# Patient Record
Sex: Male | Born: 1940 | Race: White | Hispanic: No | Marital: Married | Smoking: Current some day smoker
Health system: Southern US, Community
[De-identification: ages and names within clinical notes are randomized; demographics above are authoritative.]

## PROBLEM LIST (undated history)

## (undated) DIAGNOSIS — M1990 Unspecified osteoarthritis, unspecified site: Secondary | ICD-10-CM

## (undated) DIAGNOSIS — F1011 Alcohol abuse, in remission: Secondary | ICD-10-CM

## (undated) DIAGNOSIS — R0602 Shortness of breath: Secondary | ICD-10-CM

## (undated) DIAGNOSIS — Z8679 Personal history of other diseases of the circulatory system: Secondary | ICD-10-CM

## (undated) DIAGNOSIS — J189 Pneumonia, unspecified organism: Secondary | ICD-10-CM

## (undated) DIAGNOSIS — Z9889 Other specified postprocedural states: Secondary | ICD-10-CM

## (undated) DIAGNOSIS — I2510 Atherosclerotic heart disease of native coronary artery without angina pectoris: Secondary | ICD-10-CM

## (undated) DIAGNOSIS — I719 Aortic aneurysm of unspecified site, without rupture: Secondary | ICD-10-CM

## (undated) DIAGNOSIS — C801 Malignant (primary) neoplasm, unspecified: Secondary | ICD-10-CM

## (undated) DIAGNOSIS — S301XXA Contusion of abdominal wall, initial encounter: Secondary | ICD-10-CM

## (undated) DIAGNOSIS — D099 Carcinoma in situ, unspecified: Secondary | ICD-10-CM

## (undated) DIAGNOSIS — Z860100 Personal history of colon polyps, unspecified: Secondary | ICD-10-CM

## (undated) DIAGNOSIS — I4891 Unspecified atrial fibrillation: Secondary | ICD-10-CM

## (undated) DIAGNOSIS — D509 Iron deficiency anemia, unspecified: Secondary | ICD-10-CM

## (undated) DIAGNOSIS — K5790 Diverticulosis of intestine, part unspecified, without perforation or abscess without bleeding: Secondary | ICD-10-CM

## (undated) DIAGNOSIS — Z952 Presence of prosthetic heart valve: Secondary | ICD-10-CM

## (undated) DIAGNOSIS — J449 Chronic obstructive pulmonary disease, unspecified: Secondary | ICD-10-CM

## (undated) DIAGNOSIS — K529 Noninfective gastroenteritis and colitis, unspecified: Secondary | ICD-10-CM

## (undated) DIAGNOSIS — R634 Abnormal weight loss: Secondary | ICD-10-CM

## (undated) DIAGNOSIS — N179 Acute kidney failure, unspecified: Secondary | ICD-10-CM

## (undated) DIAGNOSIS — Z8719 Personal history of other diseases of the digestive system: Secondary | ICD-10-CM

## (undated) HISTORY — DX: Diverticulosis of intestine, part unspecified, without perforation or abscess without bleeding: K57.90

## (undated) HISTORY — DX: Personal history of other diseases of the digestive system: Z87.19

## (undated) HISTORY — DX: Noninfective gastroenteritis and colitis, unspecified: K52.9

## (undated) HISTORY — PX: COLONOSCOPY: SHX174

## (undated) HISTORY — DX: Iron deficiency anemia, unspecified: D50.9

## (undated) HISTORY — DX: Contusion of abdominal wall, initial encounter: S30.1XXA

## (undated) HISTORY — PX: TONSILLECTOMY: SUR1361

## (undated) HISTORY — DX: Aortic aneurysm of unspecified site, without rupture: I71.9

## (undated) HISTORY — DX: Abnormal weight loss: R63.4

## (undated) HISTORY — DX: Other specified postprocedural states: Z98.890

## (undated) HISTORY — DX: Unspecified atrial fibrillation: I48.91

## (undated) HISTORY — DX: Atherosclerotic heart disease of native coronary artery without angina pectoris: I25.10

## (undated) HISTORY — PX: MAZE: SHX5063

## (undated) HISTORY — DX: Presence of prosthetic heart valve: Z95.2

## (undated) HISTORY — DX: Personal history of other diseases of the circulatory system: Z86.79

## (undated) HISTORY — DX: Carcinoma in situ, unspecified: D09.9

## (undated) HISTORY — DX: Alcohol abuse, in remission: F10.11

## (undated) HISTORY — DX: Malignant (primary) neoplasm, unspecified: C80.1

## (undated) HISTORY — DX: Chronic obstructive pulmonary disease, unspecified: J44.9

---

## 1994-12-24 DIAGNOSIS — D099 Carcinoma in situ, unspecified: Secondary | ICD-10-CM

## 1994-12-24 HISTORY — DX: Carcinoma in situ, unspecified: D09.9

## 1995-12-25 HISTORY — PX: HEMORRHOID SURGERY: SHX153

## 2001-04-18 ENCOUNTER — Encounter: Payer: Self-pay | Admitting: *Deleted

## 2001-04-18 ENCOUNTER — Ambulatory Visit (HOSPITAL_COMMUNITY): Admission: RE | Admit: 2001-04-18 | Discharge: 2001-04-18 | Payer: Self-pay | Admitting: *Deleted

## 2001-04-21 ENCOUNTER — Ambulatory Visit (HOSPITAL_COMMUNITY): Admission: RE | Admit: 2001-04-21 | Discharge: 2001-04-21 | Payer: Self-pay | Admitting: *Deleted

## 2001-05-02 ENCOUNTER — Ambulatory Visit (HOSPITAL_COMMUNITY): Admission: RE | Admit: 2001-05-02 | Discharge: 2001-05-02 | Payer: Self-pay | Admitting: Internal Medicine

## 2005-01-11 ENCOUNTER — Ambulatory Visit (HOSPITAL_COMMUNITY): Admission: RE | Admit: 2005-01-11 | Discharge: 2005-01-11 | Payer: Self-pay | Admitting: Family Medicine

## 2005-01-16 ENCOUNTER — Ambulatory Visit (HOSPITAL_COMMUNITY): Admission: RE | Admit: 2005-01-16 | Discharge: 2005-01-16 | Payer: Self-pay | Admitting: Family Medicine

## 2005-06-27 ENCOUNTER — Ambulatory Visit (HOSPITAL_COMMUNITY): Admission: RE | Admit: 2005-06-27 | Discharge: 2005-06-27 | Payer: Self-pay | Admitting: Surgery

## 2005-08-13 ENCOUNTER — Inpatient Hospital Stay (HOSPITAL_COMMUNITY): Admission: AD | Admit: 2005-08-13 | Discharge: 2005-08-15 | Payer: Self-pay | Admitting: Cardiology

## 2005-08-13 ENCOUNTER — Ambulatory Visit: Payer: Self-pay | Admitting: Cardiovascular Disease

## 2005-08-14 ENCOUNTER — Encounter: Payer: Self-pay | Admitting: Cardiovascular Disease

## 2005-08-20 ENCOUNTER — Ambulatory Visit: Payer: Self-pay | Admitting: Cardiology

## 2005-08-28 ENCOUNTER — Ambulatory Visit: Payer: Self-pay | Admitting: Cardiology

## 2005-09-03 ENCOUNTER — Ambulatory Visit: Payer: Self-pay | Admitting: Internal Medicine

## 2005-09-17 ENCOUNTER — Ambulatory Visit: Payer: Self-pay | Admitting: Internal Medicine

## 2005-09-20 ENCOUNTER — Ambulatory Visit: Payer: Self-pay | Admitting: Cardiovascular Disease

## 2005-10-01 ENCOUNTER — Ambulatory Visit: Payer: Self-pay | Admitting: Internal Medicine

## 2005-10-03 ENCOUNTER — Ambulatory Visit: Payer: Self-pay | Admitting: Cardiovascular Disease

## 2005-10-08 ENCOUNTER — Ambulatory Visit: Payer: Self-pay | Admitting: Internal Medicine

## 2005-10-10 ENCOUNTER — Ambulatory Visit (HOSPITAL_COMMUNITY): Admission: RE | Admit: 2005-10-10 | Discharge: 2005-10-10 | Payer: Self-pay | Admitting: Cardiovascular Disease

## 2005-10-12 ENCOUNTER — Ambulatory Visit: Payer: Self-pay | Admitting: Cardiology

## 2005-10-12 ENCOUNTER — Ambulatory Visit: Payer: Self-pay | Admitting: Cardiovascular Disease

## 2005-10-29 ENCOUNTER — Ambulatory Visit: Payer: Self-pay | Admitting: Pulmonary Disease

## 2005-11-05 ENCOUNTER — Ambulatory Visit: Payer: Self-pay | Admitting: Cardiology

## 2005-11-26 ENCOUNTER — Ambulatory Visit: Payer: Self-pay | Admitting: Cardiology

## 2005-11-30 ENCOUNTER — Ambulatory Visit: Payer: Self-pay | Admitting: Pulmonary Disease

## 2005-12-14 ENCOUNTER — Ambulatory Visit: Payer: Self-pay | Admitting: Cardiology

## 2006-01-04 ENCOUNTER — Ambulatory Visit: Payer: Self-pay | Admitting: Cardiovascular Disease

## 2006-01-21 ENCOUNTER — Ambulatory Visit: Payer: Self-pay | Admitting: Cardiovascular Disease

## 2006-01-21 ENCOUNTER — Ambulatory Visit: Payer: Self-pay | Admitting: Cardiology

## 2006-01-25 ENCOUNTER — Ambulatory Visit: Payer: Self-pay | Admitting: Cardiology

## 2006-02-18 ENCOUNTER — Ambulatory Visit: Payer: Self-pay | Admitting: Internal Medicine

## 2006-03-01 ENCOUNTER — Ambulatory Visit: Payer: Self-pay | Admitting: Pulmonary Disease

## 2006-03-01 ENCOUNTER — Ambulatory Visit (HOSPITAL_BASED_OUTPATIENT_CLINIC_OR_DEPARTMENT_OTHER): Admission: RE | Admit: 2006-03-01 | Discharge: 2006-03-01 | Payer: Self-pay | Admitting: Pulmonary Disease

## 2006-03-12 ENCOUNTER — Ambulatory Visit: Payer: Self-pay | Admitting: Pulmonary Disease

## 2006-03-15 ENCOUNTER — Ambulatory Visit: Payer: Self-pay | Admitting: Cardiovascular Disease

## 2006-04-04 ENCOUNTER — Ambulatory Visit: Payer: Self-pay | Admitting: Cardiology

## 2006-04-04 ENCOUNTER — Ambulatory Visit: Payer: Self-pay | Admitting: Cardiovascular Disease

## 2006-04-10 ENCOUNTER — Ambulatory Visit (HOSPITAL_COMMUNITY): Admission: RE | Admit: 2006-04-10 | Discharge: 2006-04-10 | Payer: Self-pay | Admitting: Cardiovascular Disease

## 2006-04-10 ENCOUNTER — Ambulatory Visit: Payer: Self-pay | Admitting: Cardiovascular Disease

## 2006-04-12 ENCOUNTER — Ambulatory Visit: Payer: Self-pay | Admitting: Pulmonary Disease

## 2006-04-22 ENCOUNTER — Ambulatory Visit: Payer: Self-pay | Admitting: Cardiology

## 2006-04-22 ENCOUNTER — Ambulatory Visit: Payer: Self-pay | Admitting: Cardiovascular Disease

## 2006-05-17 ENCOUNTER — Ambulatory Visit: Payer: Self-pay | Admitting: Cardiology

## 2006-05-27 ENCOUNTER — Ambulatory Visit: Payer: Self-pay | Admitting: Cardiology

## 2006-05-30 ENCOUNTER — Ambulatory Visit: Payer: Self-pay | Admitting: Internal Medicine

## 2006-06-14 ENCOUNTER — Ambulatory Visit: Payer: Self-pay | Admitting: Cardiovascular Disease

## 2006-07-05 ENCOUNTER — Ambulatory Visit: Payer: Self-pay | Admitting: Internal Medicine

## 2006-07-05 ENCOUNTER — Ambulatory Visit (HOSPITAL_COMMUNITY): Admission: RE | Admit: 2006-07-05 | Discharge: 2006-07-05 | Payer: Self-pay | Admitting: Internal Medicine

## 2006-07-05 ENCOUNTER — Encounter (INDEPENDENT_AMBULATORY_CARE_PROVIDER_SITE_OTHER): Payer: Self-pay | Admitting: Specialist

## 2006-09-02 ENCOUNTER — Ambulatory Visit: Payer: Self-pay | Admitting: Cardiovascular Disease

## 2007-02-10 ENCOUNTER — Ambulatory Visit: Payer: Self-pay | Admitting: Cardiovascular Disease

## 2007-02-10 LAB — CONVERTED CEMR LAB
BUN: 13 mg/dL (ref 6–23)
Chloride: 102 meq/L (ref 96–112)
Creatinine, Ser: 1 mg/dL (ref 0.4–1.5)

## 2007-02-14 ENCOUNTER — Ambulatory Visit: Payer: Self-pay | Admitting: Internal Medicine

## 2007-03-14 ENCOUNTER — Encounter: Admission: RE | Admit: 2007-03-14 | Discharge: 2007-03-14 | Payer: Self-pay | Admitting: Surgery

## 2007-03-17 ENCOUNTER — Emergency Department (HOSPITAL_COMMUNITY): Admission: EM | Admit: 2007-03-17 | Discharge: 2007-03-17 | Payer: Self-pay | Admitting: Emergency Medicine

## 2007-03-18 ENCOUNTER — Ambulatory Visit: Payer: Self-pay | Admitting: Surgery

## 2007-03-31 ENCOUNTER — Ambulatory Visit: Payer: Self-pay | Admitting: Cardiovascular Disease

## 2007-03-31 LAB — CONVERTED CEMR LAB
Basophils Relative: 0.3 % (ref 0.0–1.0)
CO2: 29 meq/L (ref 19–32)
Creatinine, Ser: 1 mg/dL (ref 0.4–1.5)
HCT: 43.8 % (ref 39.0–52.0)
Hemoglobin: 14.7 g/dL (ref 13.0–17.0)
INR: 1.4 (ref 0.9–2.0)
Monocytes Absolute: 0.5 10*3/uL (ref 0.2–0.7)
Neutrophils Relative %: 68.4 % (ref 43.0–77.0)
Potassium: 4 meq/L (ref 3.5–5.1)
Prothrombin Time: 14.4 s — ABNORMAL HIGH (ref 10.0–14.0)
RDW: 13.2 % (ref 11.5–14.6)
Sodium: 139 meq/L (ref 135–145)

## 2007-04-03 ENCOUNTER — Inpatient Hospital Stay (HOSPITAL_BASED_OUTPATIENT_CLINIC_OR_DEPARTMENT_OTHER): Admission: RE | Admit: 2007-04-03 | Discharge: 2007-04-03 | Payer: Self-pay | Admitting: Cardiovascular Disease

## 2007-04-03 ENCOUNTER — Ambulatory Visit: Payer: Self-pay | Admitting: Cardiovascular Disease

## 2007-04-03 HISTORY — PX: CARDIAC CATHETERIZATION: SHX172

## 2007-04-08 ENCOUNTER — Ambulatory Visit: Payer: Self-pay | Admitting: Surgery

## 2007-04-16 ENCOUNTER — Encounter (INDEPENDENT_AMBULATORY_CARE_PROVIDER_SITE_OTHER): Payer: Self-pay | Admitting: Specialist

## 2007-04-16 ENCOUNTER — Ambulatory Visit: Payer: Self-pay | Admitting: Surgery

## 2007-04-16 ENCOUNTER — Inpatient Hospital Stay (HOSPITAL_COMMUNITY): Admission: RE | Admit: 2007-04-16 | Discharge: 2007-04-23 | Payer: Self-pay | Admitting: Surgery

## 2007-04-16 ENCOUNTER — Ambulatory Visit: Payer: Self-pay | Admitting: Cardiovascular Disease

## 2007-04-16 HISTORY — PX: AORTIC VALVE REPLACEMENT: SHX41

## 2007-05-06 ENCOUNTER — Ambulatory Visit (HOSPITAL_COMMUNITY): Admission: RE | Admit: 2007-05-06 | Discharge: 2007-05-06 | Payer: Self-pay | Admitting: Cardiology

## 2007-05-06 ENCOUNTER — Encounter (HOSPITAL_COMMUNITY): Admission: RE | Admit: 2007-05-06 | Discharge: 2007-06-05 | Payer: Self-pay | Admitting: Cardiovascular Disease

## 2007-05-06 ENCOUNTER — Ambulatory Visit: Payer: Self-pay | Admitting: Cardiovascular Disease

## 2007-05-13 ENCOUNTER — Ambulatory Visit: Payer: Self-pay | Admitting: Surgery

## 2007-05-22 ENCOUNTER — Ambulatory Visit: Payer: Self-pay | Admitting: Cardiology

## 2007-05-22 ENCOUNTER — Ambulatory Visit (HOSPITAL_COMMUNITY): Admission: RE | Admit: 2007-05-22 | Discharge: 2007-05-22 | Payer: Self-pay | Admitting: Cardiovascular Disease

## 2007-05-27 ENCOUNTER — Ambulatory Visit: Payer: Self-pay | Admitting: Cardiology

## 2007-05-29 ENCOUNTER — Ambulatory Visit: Payer: Self-pay | Admitting: Cardiovascular Disease

## 2007-06-09 ENCOUNTER — Encounter (HOSPITAL_COMMUNITY): Admission: RE | Admit: 2007-06-09 | Discharge: 2007-07-09 | Payer: Self-pay | Admitting: Cardiovascular Disease

## 2007-06-18 ENCOUNTER — Ambulatory Visit (HOSPITAL_COMMUNITY): Admission: RE | Admit: 2007-06-18 | Discharge: 2007-06-18 | Payer: Self-pay | Admitting: Cardiovascular Disease

## 2007-06-18 ENCOUNTER — Ambulatory Visit: Payer: Self-pay | Admitting: Cardiovascular Disease

## 2007-07-11 ENCOUNTER — Encounter (HOSPITAL_COMMUNITY): Admission: RE | Admit: 2007-07-11 | Discharge: 2007-08-10 | Payer: Self-pay | Admitting: Cardiovascular Disease

## 2007-08-11 ENCOUNTER — Encounter (HOSPITAL_COMMUNITY): Admission: RE | Admit: 2007-08-11 | Discharge: 2007-09-10 | Payer: Self-pay | Admitting: Cardiovascular Disease

## 2007-08-12 ENCOUNTER — Ambulatory Visit: Payer: Self-pay | Admitting: Cardiovascular Disease

## 2007-09-23 ENCOUNTER — Ambulatory Visit: Payer: Self-pay | Admitting: Cardiology

## 2007-11-11 ENCOUNTER — Ambulatory Visit: Payer: Self-pay | Admitting: Cardiovascular Disease

## 2007-11-27 ENCOUNTER — Ambulatory Visit: Payer: Self-pay | Admitting: Cardiovascular Disease

## 2007-11-27 ENCOUNTER — Ambulatory Visit: Payer: Self-pay | Admitting: Internal Medicine

## 2007-11-27 LAB — CONVERTED CEMR LAB
AST: 21 units/L (ref 0–37)
Alkaline Phosphatase: 87 units/L (ref 39–117)
BUN: 11 mg/dL (ref 6–23)
CO2: 30 meq/L (ref 19–32)
Creatinine, Ser: 1.1 mg/dL (ref 0.4–1.5)
Potassium: 4.1 meq/L (ref 3.5–5.1)
TSH: 1 microintl units/mL (ref 0.35–5.50)
Total Bilirubin: 0.5 mg/dL (ref 0.3–1.2)
Total Protein: 6.9 g/dL (ref 6.0–8.3)

## 2007-12-12 ENCOUNTER — Ambulatory Visit: Payer: Self-pay | Admitting: Cardiology

## 2007-12-19 ENCOUNTER — Ambulatory Visit (HOSPITAL_COMMUNITY): Admission: RE | Admit: 2007-12-19 | Discharge: 2007-12-19 | Payer: Self-pay | Admitting: Family Medicine

## 2007-12-26 ENCOUNTER — Ambulatory Visit: Payer: Self-pay | Admitting: Cardiology

## 2008-01-09 ENCOUNTER — Ambulatory Visit: Payer: Self-pay | Admitting: Cardiology

## 2008-01-16 ENCOUNTER — Ambulatory Visit: Payer: Self-pay | Admitting: Cardiovascular Disease

## 2008-01-29 ENCOUNTER — Ambulatory Visit: Payer: Self-pay | Admitting: Cardiology

## 2008-01-30 ENCOUNTER — Ambulatory Visit: Payer: Self-pay | Admitting: Cardiovascular Disease

## 2008-01-30 ENCOUNTER — Ambulatory Visit (HOSPITAL_COMMUNITY): Admission: RE | Admit: 2008-01-30 | Discharge: 2008-01-30 | Payer: Self-pay | Admitting: Cardiovascular Disease

## 2008-02-09 ENCOUNTER — Ambulatory Visit: Payer: Self-pay | Admitting: Internal Medicine

## 2008-02-09 ENCOUNTER — Ambulatory Visit: Payer: Self-pay | Admitting: Cardiovascular Disease

## 2008-02-20 ENCOUNTER — Ambulatory Visit: Payer: Self-pay | Admitting: Cardiology

## 2008-03-25 ENCOUNTER — Ambulatory Visit: Payer: Self-pay | Admitting: Cardiovascular Disease

## 2008-04-09 ENCOUNTER — Ambulatory Visit: Payer: Self-pay | Admitting: Cardiology

## 2008-05-07 ENCOUNTER — Ambulatory Visit: Payer: Self-pay | Admitting: Cardiology

## 2008-06-04 ENCOUNTER — Ambulatory Visit: Payer: Self-pay | Admitting: Cardiology

## 2008-07-08 ENCOUNTER — Ambulatory Visit: Payer: Self-pay | Admitting: Internal Medicine

## 2008-07-09 ENCOUNTER — Ambulatory Visit: Payer: Self-pay | Admitting: Cardiology

## 2008-07-16 ENCOUNTER — Ambulatory Visit: Payer: Self-pay | Admitting: Cardiology

## 2008-07-23 ENCOUNTER — Ambulatory Visit (HOSPITAL_COMMUNITY): Admission: RE | Admit: 2008-07-23 | Discharge: 2008-07-23 | Payer: Self-pay | Admitting: Internal Medicine

## 2008-07-23 ENCOUNTER — Encounter: Payer: Self-pay | Admitting: Internal Medicine

## 2008-07-23 ENCOUNTER — Ambulatory Visit: Payer: Self-pay | Admitting: Internal Medicine

## 2008-07-23 DIAGNOSIS — K5790 Diverticulosis of intestine, part unspecified, without perforation or abscess without bleeding: Secondary | ICD-10-CM

## 2008-07-23 HISTORY — DX: Diverticulosis of intestine, part unspecified, without perforation or abscess without bleeding: K57.90

## 2008-07-26 ENCOUNTER — Ambulatory Visit: Payer: Self-pay | Admitting: Cardiology

## 2008-07-30 ENCOUNTER — Ambulatory Visit: Payer: Self-pay | Admitting: Cardiology

## 2008-08-13 ENCOUNTER — Ambulatory Visit: Payer: Self-pay | Admitting: Cardiology

## 2008-09-10 ENCOUNTER — Ambulatory Visit: Payer: Self-pay | Admitting: Cardiology

## 2008-09-24 ENCOUNTER — Ambulatory Visit: Payer: Self-pay | Admitting: Cardiovascular Disease

## 2008-09-24 ENCOUNTER — Ambulatory Visit: Payer: Self-pay | Admitting: Cardiology

## 2008-11-05 ENCOUNTER — Ambulatory Visit: Payer: Self-pay | Admitting: Cardiology

## 2008-12-20 ENCOUNTER — Ambulatory Visit: Payer: Self-pay | Admitting: Cardiology

## 2009-01-21 ENCOUNTER — Ambulatory Visit: Payer: Self-pay | Admitting: Cardiology

## 2009-02-04 ENCOUNTER — Ambulatory Visit: Payer: Self-pay | Admitting: Cardiology

## 2009-02-21 DIAGNOSIS — S301XXA Contusion of abdominal wall, initial encounter: Secondary | ICD-10-CM

## 2009-02-21 HISTORY — DX: Contusion of abdominal wall, initial encounter: S30.1XXA

## 2009-02-28 ENCOUNTER — Ambulatory Visit (HOSPITAL_COMMUNITY): Admission: RE | Admit: 2009-02-28 | Discharge: 2009-02-28 | Payer: Self-pay | Admitting: Family Medicine

## 2009-03-04 ENCOUNTER — Ambulatory Visit: Payer: Self-pay | Admitting: Cardiology

## 2009-03-11 ENCOUNTER — Ambulatory Visit: Payer: Self-pay | Admitting: Cardiology

## 2009-03-13 ENCOUNTER — Inpatient Hospital Stay (HOSPITAL_COMMUNITY): Admission: EM | Admit: 2009-03-13 | Discharge: 2009-03-16 | Payer: Self-pay | Admitting: Emergency Medicine

## 2009-03-31 DIAGNOSIS — I4891 Unspecified atrial fibrillation: Secondary | ICD-10-CM | POA: Insufficient documentation

## 2009-03-31 DIAGNOSIS — J449 Chronic obstructive pulmonary disease, unspecified: Secondary | ICD-10-CM

## 2009-03-31 DIAGNOSIS — J4489 Other specified chronic obstructive pulmonary disease: Secondary | ICD-10-CM | POA: Insufficient documentation

## 2009-03-31 DIAGNOSIS — Z954 Presence of other heart-valve replacement: Secondary | ICD-10-CM

## 2009-03-31 DIAGNOSIS — I25810 Atherosclerosis of coronary artery bypass graft(s) without angina pectoris: Secondary | ICD-10-CM | POA: Insufficient documentation

## 2009-04-01 ENCOUNTER — Encounter: Payer: Self-pay | Admitting: Cardiovascular Disease

## 2009-04-01 ENCOUNTER — Ambulatory Visit: Payer: Self-pay | Admitting: Cardiovascular Disease

## 2009-04-29 ENCOUNTER — Ambulatory Visit: Payer: Self-pay | Admitting: Cardiovascular Disease

## 2009-05-12 ENCOUNTER — Encounter: Payer: Self-pay | Admitting: Cardiovascular Disease

## 2009-05-16 ENCOUNTER — Encounter: Payer: Self-pay | Admitting: Cardiovascular Disease

## 2009-05-16 ENCOUNTER — Telehealth: Payer: Self-pay | Admitting: Cardiovascular Disease

## 2009-05-20 ENCOUNTER — Encounter: Payer: Self-pay | Admitting: Cardiovascular Disease

## 2009-05-24 ENCOUNTER — Telehealth: Payer: Self-pay | Admitting: Cardiovascular Disease

## 2009-05-26 ENCOUNTER — Encounter: Payer: Self-pay | Admitting: Cardiovascular Disease

## 2009-06-24 ENCOUNTER — Encounter: Payer: Self-pay | Admitting: Cardiovascular Disease

## 2009-06-28 ENCOUNTER — Telehealth: Payer: Self-pay | Admitting: Cardiovascular Disease

## 2009-06-28 LAB — CONVERTED CEMR LAB
INR: 1.9 — ABNORMAL HIGH (ref 0.0–1.5)
Prothrombin Time: 23.2 s — ABNORMAL HIGH (ref 11.6–15.2)
aPTT: 33 s (ref 24–37)

## 2009-07-11 ENCOUNTER — Ambulatory Visit: Payer: Self-pay | Admitting: Cardiovascular Disease

## 2009-07-11 DIAGNOSIS — Z91038 Other insect allergy status: Secondary | ICD-10-CM | POA: Insufficient documentation

## 2009-08-04 ENCOUNTER — Encounter (INDEPENDENT_AMBULATORY_CARE_PROVIDER_SITE_OTHER): Payer: Self-pay | Admitting: *Deleted

## 2009-08-05 ENCOUNTER — Telehealth: Payer: Self-pay | Admitting: Cardiovascular Disease

## 2009-08-08 ENCOUNTER — Encounter: Payer: Self-pay | Admitting: *Deleted

## 2009-08-09 LAB — CONVERTED CEMR LAB
INR: 1.5 (ref 0.0–1.5)
Prothrombin Time: 18.3 s — ABNORMAL HIGH (ref 11.6–15.2)

## 2009-08-18 ENCOUNTER — Encounter: Payer: Self-pay | Admitting: Cardiovascular Disease

## 2009-08-19 LAB — CONVERTED CEMR LAB: INR: 2.9 — ABNORMAL HIGH (ref 0.0–1.5)

## 2009-09-02 ENCOUNTER — Telehealth: Payer: Self-pay | Admitting: Cardiovascular Disease

## 2009-09-05 ENCOUNTER — Telehealth: Payer: Self-pay | Admitting: Cardiovascular Disease

## 2009-09-22 ENCOUNTER — Encounter: Payer: Self-pay | Admitting: Cardiovascular Disease

## 2009-09-26 LAB — CONVERTED CEMR LAB: aPTT: 35 s (ref 24–37)

## 2009-10-27 ENCOUNTER — Encounter: Payer: Self-pay | Admitting: Cardiovascular Disease

## 2009-10-27 LAB — CONVERTED CEMR LAB
INR: 1.91 — ABNORMAL HIGH (ref <1.50)
Prothrombin Time: 21.7 s — ABNORMAL HIGH (ref 11.6–15.2)
aPTT: 34 s (ref 24–37)

## 2009-10-28 ENCOUNTER — Ambulatory Visit: Payer: Self-pay | Admitting: Cardiovascular Disease

## 2009-11-29 ENCOUNTER — Encounter: Payer: Self-pay | Admitting: Cardiovascular Disease

## 2009-11-30 LAB — CONVERTED CEMR LAB: Prothrombin Time: 16.8 s — ABNORMAL HIGH (ref 11.6–15.2)

## 2009-12-01 ENCOUNTER — Ambulatory Visit (HOSPITAL_COMMUNITY): Admission: RE | Admit: 2009-12-01 | Discharge: 2009-12-01 | Payer: Self-pay | Admitting: Family Medicine

## 2009-12-06 ENCOUNTER — Encounter (INDEPENDENT_AMBULATORY_CARE_PROVIDER_SITE_OTHER): Payer: Self-pay | Admitting: *Deleted

## 2009-12-30 ENCOUNTER — Encounter: Payer: Self-pay | Admitting: Cardiovascular Disease

## 2010-01-13 ENCOUNTER — Encounter: Payer: Self-pay | Admitting: Cardiovascular Disease

## 2010-01-17 LAB — CONVERTED CEMR LAB
INR: 1.85 — ABNORMAL HIGH (ref <1.50)
Prothrombin Time: 21.2 s — ABNORMAL HIGH (ref 11.6–15.2)
aPTT: 35 s (ref 24–37)

## 2010-02-09 ENCOUNTER — Encounter: Payer: Self-pay | Admitting: Cardiovascular Disease

## 2010-02-10 LAB — CONVERTED CEMR LAB
INR: 2.07 — ABNORMAL HIGH (ref <1.50)
Prothrombin Time: 23.1 s — ABNORMAL HIGH (ref 11.6–15.2)
aPTT: 37 s (ref 24–37)

## 2010-02-21 DIAGNOSIS — Z8719 Personal history of other diseases of the digestive system: Secondary | ICD-10-CM

## 2010-02-21 HISTORY — DX: Personal history of other diseases of the digestive system: Z87.19

## 2010-03-08 ENCOUNTER — Encounter (INDEPENDENT_AMBULATORY_CARE_PROVIDER_SITE_OTHER): Payer: Self-pay | Admitting: *Deleted

## 2010-03-14 ENCOUNTER — Encounter: Payer: Self-pay | Admitting: Cardiovascular Disease

## 2010-04-13 ENCOUNTER — Encounter: Payer: Self-pay | Admitting: Cardiovascular Disease

## 2010-04-17 LAB — CONVERTED CEMR LAB: aPTT: 36 s (ref 24–37)

## 2010-04-27 ENCOUNTER — Ambulatory Visit: Payer: Self-pay | Admitting: Cardiovascular Disease

## 2010-05-01 ENCOUNTER — Inpatient Hospital Stay (HOSPITAL_COMMUNITY): Admission: EM | Admit: 2010-05-01 | Discharge: 2010-05-05 | Payer: Self-pay | Admitting: Emergency Medicine

## 2010-05-01 ENCOUNTER — Telehealth: Payer: Self-pay | Admitting: Cardiovascular Disease

## 2010-05-02 ENCOUNTER — Other Ambulatory Visit: Payer: Self-pay | Admitting: Cardiology

## 2010-05-02 ENCOUNTER — Ambulatory Visit: Payer: Self-pay | Admitting: Cardiology

## 2010-05-02 ENCOUNTER — Telehealth: Payer: Self-pay | Admitting: Cardiovascular Disease

## 2010-05-02 ENCOUNTER — Ambulatory Visit: Payer: Self-pay | Admitting: Gastroenterology

## 2010-05-03 ENCOUNTER — Other Ambulatory Visit: Payer: Self-pay | Admitting: Internal Medicine

## 2010-05-03 ENCOUNTER — Other Ambulatory Visit: Payer: Self-pay | Admitting: Cardiology

## 2010-05-03 ENCOUNTER — Other Ambulatory Visit: Payer: Self-pay | Admitting: Cardiovascular Disease

## 2010-05-03 ENCOUNTER — Encounter: Payer: Self-pay | Admitting: Internal Medicine

## 2010-05-03 DIAGNOSIS — Z9889 Other specified postprocedural states: Secondary | ICD-10-CM

## 2010-05-03 HISTORY — DX: Other specified postprocedural states: Z98.890

## 2010-05-04 ENCOUNTER — Other Ambulatory Visit: Payer: Self-pay | Admitting: Cardiology

## 2010-05-05 ENCOUNTER — Other Ambulatory Visit: Payer: Self-pay | Admitting: Internal Medicine

## 2010-05-15 ENCOUNTER — Ambulatory Visit: Payer: Self-pay | Admitting: Cardiovascular Disease

## 2010-05-25 DIAGNOSIS — Z87442 Personal history of urinary calculi: Secondary | ICD-10-CM

## 2010-05-25 DIAGNOSIS — C189 Malignant neoplasm of colon, unspecified: Secondary | ICD-10-CM | POA: Insufficient documentation

## 2010-05-26 ENCOUNTER — Ambulatory Visit: Payer: Self-pay | Admitting: Internal Medicine

## 2010-05-26 DIAGNOSIS — Z8719 Personal history of other diseases of the digestive system: Secondary | ICD-10-CM

## 2010-05-29 ENCOUNTER — Encounter: Payer: Self-pay | Admitting: Cardiovascular Disease

## 2010-05-30 ENCOUNTER — Telehealth (INDEPENDENT_AMBULATORY_CARE_PROVIDER_SITE_OTHER): Payer: Self-pay | Admitting: *Deleted

## 2010-05-30 ENCOUNTER — Telehealth: Payer: Self-pay | Admitting: Cardiovascular Disease

## 2010-05-31 ENCOUNTER — Encounter: Payer: Self-pay | Admitting: Gastroenterology

## 2010-05-31 LAB — CONVERTED CEMR LAB
HCT: 34.5 % — ABNORMAL LOW (ref 39.0–52.0)
Hemoglobin: 11.4 g/dL — ABNORMAL LOW (ref 13.0–17.0)
INR: 2.03 — ABNORMAL HIGH (ref <1.50)

## 2010-06-09 ENCOUNTER — Encounter (INDEPENDENT_AMBULATORY_CARE_PROVIDER_SITE_OTHER): Payer: Self-pay

## 2010-06-30 ENCOUNTER — Encounter: Payer: Self-pay | Admitting: Cardiovascular Disease

## 2010-06-30 ENCOUNTER — Encounter: Payer: Self-pay | Admitting: Gastroenterology

## 2010-07-10 LAB — CONVERTED CEMR LAB: Hemoglobin: 12.6 g/dL — ABNORMAL LOW (ref 13.0–17.0)

## 2010-08-01 ENCOUNTER — Encounter: Payer: Self-pay | Admitting: Cardiovascular Disease

## 2010-08-04 LAB — CONVERTED CEMR LAB
INR: 1.57 — ABNORMAL HIGH (ref <1.50)
aPTT: 36 s (ref 24–37)

## 2010-08-10 ENCOUNTER — Encounter: Payer: Self-pay | Admitting: Gastroenterology

## 2010-08-16 ENCOUNTER — Ambulatory Visit: Payer: Self-pay | Admitting: Cardiovascular Disease

## 2010-08-30 ENCOUNTER — Encounter: Payer: Self-pay | Admitting: Cardiovascular Disease

## 2010-08-31 LAB — CONVERTED CEMR LAB: Prothrombin Time: 27.5 s — ABNORMAL HIGH (ref 11.6–15.2)

## 2010-09-11 ENCOUNTER — Telehealth: Payer: Self-pay | Admitting: Cardiovascular Disease

## 2010-10-04 ENCOUNTER — Encounter: Payer: Self-pay | Admitting: Cardiovascular Disease

## 2010-10-06 LAB — CONVERTED CEMR LAB
INR: 2.5 — ABNORMAL HIGH (ref <1.50)
Prothrombin Time: 27.1 s — ABNORMAL HIGH (ref 11.6–15.2)

## 2010-11-07 ENCOUNTER — Encounter: Payer: Self-pay | Admitting: Cardiovascular Disease

## 2010-11-13 ENCOUNTER — Telehealth: Payer: Self-pay | Admitting: Cardiovascular Disease

## 2010-12-07 ENCOUNTER — Encounter: Payer: Self-pay | Admitting: Cardiovascular Disease

## 2010-12-20 LAB — CONVERTED CEMR LAB: INR: 2.62 — ABNORMAL HIGH (ref <1.50)

## 2011-01-03 ENCOUNTER — Telehealth: Payer: Self-pay | Admitting: Cardiovascular Disease

## 2011-01-04 ENCOUNTER — Encounter: Payer: Self-pay | Admitting: Cardiovascular Disease

## 2011-01-05 ENCOUNTER — Other Ambulatory Visit: Payer: Self-pay | Admitting: Cardiovascular Disease

## 2011-01-05 ENCOUNTER — Ambulatory Visit
Admission: RE | Admit: 2011-01-05 | Discharge: 2011-01-05 | Payer: Self-pay | Source: Home / Self Care | Attending: Cardiology | Admitting: Cardiology

## 2011-01-05 ENCOUNTER — Ambulatory Visit (HOSPITAL_COMMUNITY)
Admission: RE | Admit: 2011-01-05 | Discharge: 2011-01-05 | Payer: Self-pay | Source: Home / Self Care | Attending: Cardiovascular Disease | Admitting: Cardiovascular Disease

## 2011-01-05 ENCOUNTER — Ambulatory Visit: Admission: RE | Admit: 2011-01-05 | Discharge: 2011-01-05 | Payer: Self-pay | Source: Home / Self Care

## 2011-01-05 ENCOUNTER — Inpatient Hospital Stay (HOSPITAL_COMMUNITY)
Admission: AD | Admit: 2011-01-05 | Discharge: 2011-01-09 | Payer: Self-pay | Source: Home / Self Care | Attending: Cardiology | Admitting: Cardiology

## 2011-01-05 ENCOUNTER — Other Ambulatory Visit: Payer: Self-pay | Admitting: Physician Assistant

## 2011-01-05 ENCOUNTER — Encounter: Payer: Self-pay | Admitting: Physician Assistant

## 2011-01-05 DIAGNOSIS — R0602 Shortness of breath: Secondary | ICD-10-CM | POA: Insufficient documentation

## 2011-01-05 LAB — BASIC METABOLIC PANEL
BUN: 14 mg/dL (ref 6–23)
CO2: 30 mEq/L (ref 19–32)
Calcium: 8.9 mg/dL (ref 8.4–10.5)
Chloride: 102 mEq/L (ref 96–112)
Creatinine, Ser: 1 mg/dL (ref 0.4–1.5)
GFR: 78.22 mL/min (ref >60.00)
Glucose, Bld: 95 mg/dL (ref 70–99)
Potassium: 4.3 mEq/L (ref 3.5–5.1)
Sodium: 138 mEq/L (ref 135–145)

## 2011-01-05 LAB — CBC WITH DIFFERENTIAL/PLATELET
Basophils Absolute: 0 10*3/uL (ref 0.0–0.1)
Basophils Relative: 0 % (ref 0.0–3.0)
Eosinophils Absolute: 0.1 10*3/uL (ref 0.0–0.7)
Eosinophils Relative: 1.4 % (ref 0.0–5.0)
HCT: 24.7 % — ABNORMAL LOW (ref 39.0–52.0)
Hemoglobin: 7.6 g/dL — CL (ref 13.0–17.0)
Lymphocytes Relative: 8.2 % — ABNORMAL LOW (ref 12.0–46.0)
Lymphs Abs: 0.8 10*3/uL (ref 0.7–4.0)
MCHC: 31 g/dL (ref 30.0–36.0)
MCV: 75.1 fl — ABNORMAL LOW (ref 78.0–100.0)
Monocytes Absolute: 0.8 10*3/uL (ref 0.1–1.0)
Monocytes Relative: 8.4 % (ref 3.0–12.0)
Neutro Abs: 7.6 10*3/uL (ref 1.4–7.7)
Neutrophils Relative %: 82 % — ABNORMAL HIGH (ref 43.0–77.0)
Platelets: 329 10*3/uL (ref 150.0–400.0)
RBC: 3.29 Mil/uL — ABNORMAL LOW (ref 4.22–5.81)
RDW: 20.1 % — ABNORMAL HIGH (ref 11.5–14.6)
WBC: 9.3 10*3/uL (ref 4.5–10.5)

## 2011-01-05 LAB — CONVERTED CEMR LAB: Prothrombin Time: 26.7 s — ABNORMAL HIGH (ref 11.6–15.2)

## 2011-01-05 LAB — BRAIN NATRIURETIC PEPTIDE: Pro B Natriuretic peptide (BNP): 249.3 pg/mL — ABNORMAL HIGH (ref 0.0–100.0)

## 2011-01-06 ENCOUNTER — Encounter: Payer: Self-pay | Admitting: Cardiovascular Disease

## 2011-01-08 ENCOUNTER — Encounter: Payer: Self-pay | Admitting: Physician Assistant

## 2011-01-08 LAB — BASIC METABOLIC PANEL
BUN: 15 mg/dL (ref 6–23)
BUN: 20 mg/dL (ref 6–23)
CO2: 29 mEq/L (ref 19–32)
CO2: 30 mEq/L (ref 19–32)
Calcium: 8.9 mg/dL (ref 8.4–10.5)
Calcium: 8.7 mg/dL (ref 8.4–10.5)
Chloride: 100 mEq/L (ref 96–112)
Chloride: 103 mEq/L (ref 96–112)
Creatinine, Ser: 1.04 mg/dL (ref 0.4–1.5)
Creatinine, Ser: 1.16 mg/dL (ref 0.4–1.5)
GFR calc Af Amer: >60 mL/min (ref >60)
GFR calc Af Amer: >60 mL/min (ref >60)
GFR calc non Af Amer: >60 mL/min (ref >60)
GFR calc non Af Amer: >60 mL/min (ref >60)
Glucose, Bld: 100 mg/dL — ABNORMAL HIGH (ref 70–99)
Glucose, Bld: 96 mg/dL (ref 70–99)
Potassium: 4.3 mEq/L (ref 3.5–5.1)
Potassium: 3.8 mEq/L (ref 3.5–5.1)
Sodium: 137 mEq/L (ref 135–145)
Sodium: 139 mEq/L (ref 135–145)

## 2011-01-08 LAB — CBC
HCT: 26.5 % — ABNORMAL LOW (ref 39.0–52.0)
HCT: 27.6 % — ABNORMAL LOW (ref 39.0–52.0)
HCT: 26.6 % — ABNORMAL LOW (ref 39.0–52.0)
Hemoglobin: 8.1 g/dL — ABNORMAL LOW (ref 13.0–17.0)
Hemoglobin: 8.4 g/dL — ABNORMAL LOW (ref 13.0–17.0)
Hemoglobin: 8 g/dL — ABNORMAL LOW (ref 13.0–17.0)
MCH: 22.9 pg — ABNORMAL LOW (ref 26.0–34.0)
MCH: 23 pg — ABNORMAL LOW (ref 26.0–34.0)
MCH: 22.7 pg — ABNORMAL LOW (ref 26.0–34.0)
MCHC: 30.6 g/dL (ref 30.0–36.0)
MCHC: 30.4 g/dL (ref 30.0–36.0)
MCHC: 30.1 g/dL (ref 30.0–36.0)
MCV: 75.1 fL — ABNORMAL LOW (ref 78.0–100.0)
MCV: 75.6 fL — ABNORMAL LOW (ref 78.0–100.0)
MCV: 75.4 fL — ABNORMAL LOW (ref 78.0–100.0)
Platelets: 293 10*3/uL (ref 150–400)
Platelets: 323 10*3/uL (ref 150–400)
Platelets: 325 10*3/uL (ref 150–400)
RBC: 3.53 MIL/uL — ABNORMAL LOW (ref 4.22–5.81)
RBC: 3.65 MIL/uL — ABNORMAL LOW (ref 4.22–5.81)
RBC: 3.53 MIL/uL — ABNORMAL LOW (ref 4.22–5.81)
RDW: 18.3 % — ABNORMAL HIGH (ref 11.5–15.5)
RDW: 18.3 % — ABNORMAL HIGH (ref 11.5–15.5)
RDW: 18.3 % — ABNORMAL HIGH (ref 11.5–15.5)
WBC: 7.6 10*3/uL (ref 4.0–10.5)
WBC: 7.7 10*3/uL (ref 4.0–10.5)
WBC: 7.1 10*3/uL (ref 4.0–10.5)

## 2011-01-08 LAB — IRON AND TIBC
Iron: 21 ug/dL — ABNORMAL LOW (ref 42–135)
Saturation Ratios: 6 % — ABNORMAL LOW (ref 20–55)
TIBC: 324 ug/dL (ref 215–435)
UIBC: 303 ug/dL

## 2011-01-08 LAB — PROTIME-INR
INR: 2.02 — ABNORMAL HIGH (ref 0.00–1.49)
Prothrombin Time: 23 seconds — ABNORMAL HIGH (ref 11.6–15.2)

## 2011-01-08 LAB — POTASSIUM: Potassium: 4 mEq/L (ref 3.5–5.1)

## 2011-01-08 LAB — BRAIN NATRIURETIC PEPTIDE: Pro B Natriuretic peptide (BNP): 131 pg/mL — ABNORMAL HIGH (ref 0.0–100.0)

## 2011-01-08 LAB — VITAMIN B12: Vitamin B-12: 704 pg/mL (ref 211–911)

## 2011-01-08 LAB — FERRITIN: Ferritin: 29 ng/mL (ref 22–322)

## 2011-01-09 ENCOUNTER — Telehealth (INDEPENDENT_AMBULATORY_CARE_PROVIDER_SITE_OTHER): Payer: Self-pay | Admitting: *Deleted

## 2011-01-10 LAB — CROSSMATCH
ABO/RH(D): O POS
Antibody Screen: NEGATIVE
Unit division: 0
Unit division: 0

## 2011-01-10 LAB — HEMOCCULT GUIAC POC 1CARD (OFFICE): Fecal Occult Bld: NEGATIVE

## 2011-01-10 LAB — BASIC METABOLIC PANEL
BUN: 21 mg/dL (ref 6–23)
CO2: 32 mEq/L (ref 19–32)
Calcium: 8.8 mg/dL (ref 8.4–10.5)
Chloride: 99 mEq/L (ref 96–112)
Creatinine, Ser: 1.2 mg/dL (ref 0.4–1.5)
GFR calc Af Amer: >60 mL/min (ref >60)
GFR calc non Af Amer: 60 mL/min — ABNORMAL LOW (ref >60)
Glucose, Bld: 110 mg/dL — ABNORMAL HIGH (ref 70–99)
Potassium: 4.1 mEq/L (ref 3.5–5.1)
Sodium: 137 mEq/L (ref 135–145)

## 2011-01-10 LAB — FOLATE RBC: RBC Folate: 1039 ng/mL — ABNORMAL HIGH (ref 180–600)

## 2011-01-10 LAB — HEMOGLOBIN AND HEMATOCRIT, BLOOD
HCT: 29.7 % — ABNORMAL LOW (ref 39.0–52.0)
Hemoglobin: 9.1 g/dL — ABNORMAL LOW (ref 13.0–17.0)

## 2011-01-11 ENCOUNTER — Ambulatory Visit: Admit: 2011-01-11 | Payer: Self-pay | Admitting: Physician Assistant

## 2011-01-14 ENCOUNTER — Encounter: Payer: Self-pay | Admitting: Surgery

## 2011-01-15 ENCOUNTER — Encounter: Payer: Self-pay | Admitting: Cardiovascular Disease

## 2011-01-15 ENCOUNTER — Telehealth: Payer: Self-pay | Admitting: Cardiovascular Disease

## 2011-01-23 ENCOUNTER — Encounter: Payer: Self-pay | Admitting: Cardiovascular Disease

## 2011-01-23 NOTE — Miscellaneous (Signed)
Summary: Orders Update  Clinical Lists Changes  Orders: Added new Test order of T-Hemoglobin and Hematocrit (1005) - Signed 

## 2011-01-23 NOTE — Assessment & Plan Note (Signed)
Summary: 3 month rov/sl   Primary Provider:  [facility]   History of Present Illness: Kyle Love is seen today for F/U of tissue AVR, chronic afib with failed DCC.  Recent LGI bleed from diverticular disease in May.  Previous spontaneous flank hematoma.  Coumadin dose recently increased slightly as las 5 INR's have been under 2.  Not a candidate for Pradaxa as he has had two bleeding episodes and Pradaxa is not reversable.  No palpitatoins.  Rate contorl seems finel  Doing cardiac rehab type work out at home 5x/wk.  Not buying cigarettes but occasionally "bums: on off coworker at lunch.  Counseled for less than 10 minutes on total cessation.  Denies SSCP, dyspnea, syncope or edema.  No further gi bleeding.  F/U INR in [place] next week  Current Problems (verified): 1)  Diverticular Bleeding, Hx of  (ICD-V12.79) 2)  Renal Calculus, Hx of  (ICD-V13.01) 3)  Hx of Colon Cancer  (ICD-153.9) 4)  Bee Sting Allergy  (ICD-V15.06) 5)  COPD  (ICD-496) 6)  Cad, Artery Bypass Graft  (ICD-414.04) 7)  Aortic Valve Replacement, Hx of  (ICD-V43.3) 8)  Coumadin Therapy  (ICD-V58.61) 9)  Atrial Fibrillation  (ICD-427.31)  Current Medications (verified): 1)  Vitamin C 1000 Mg Tabs (Ascorbic Acid) .Marland Kitchen.. 1 Tab By Mouth Two Times A Day 2)  B12 .Marland Kitchen.. 1 Tab By Mouth Once Daily 3)  Folic Acid 400 Mcg Tabs (Folic Acid) .Marland Kitchen.. 1 Tab Once Daily 4)  Metoprolol Succinate 50 Mg Xr24h-Tab (Metoprolol Succinate) .Marland Kitchen.. 1 1/2 Tab By Mouth Two Times A Day 5)  Vitamin E 400 Unit Caps (Vitamin E) .Marland Kitchen.. 1 Tab By Mouth Two Times A Day 6)  Warfarin Sodium 5 Mg Tabs (Warfarin Sodium) .... As Directed 7)  Stool Softner .... As Needed 8)  Ferrous Sulfate 325 (65 Fe) Mg Tabs (Ferrous Sulfate) .Marland Kitchen.. 1 Tab Two Times A Day 9)  Aspirin 81 Mg Tbec (Aspirin) .... Take One Tablet By Mouth Daily 10)  Pantoprazole Sodium 40 Mg Tbec (Pantoprazole Sodium) .Marland Kitchen.. 1 Tab Once Daily  Allergies (verified): 1)  ! Cardizem Cd (Diltiazem Hcl  Coated Beads) 2)  ! * Pcn  Past History:  Past Medical History: Last updated: 03/31/2009 COPD (ICD-496) CAD, ARTERY BYPASS GRAFT (ICD-414.04) AORTIC VALVE REPLACEMENT, HX OF (ICD-V43.3) COUMADIN THERAPY (ICD-V58.61) ATRIAL FIBRILLATION (ICD-427.31)    Past Surgical History: Last updated: 05/25/2010 The patient received a 27 mm Medtronic freestyle aortic       root heart valve prosthesis in April 2008.   hemorrhoid HEMORRHOIDECTOMY TONSILLECTOMY  Family History: Last updated: 04/29/2009 non-contributory  Social History: Last updated: 04/29/2009 Full Time Married  celebrated 48tj aniv 2010 Tobacco Use - Yes.  Alcohol Use - no Drug Use - no Enjoys golf  Review of Systems       Denies fever, malais, weight loss, blurry vision, decreased visual acuity, cough, sputum, SOB, hemoptysis, pleuritic pain, palpitaitons, heartburn, abdominal pain, melena, lower extremity edema, claudication, or rash.   Vital Signs:  Patient profile:   70 year old male Height:      72 inches Weight:      228 pounds BMI:     31.03 Pulse rate:   88 / minute Pulse rhythm:   irregularly irregular Resp:     12 per minute BP supine:   128 / 73  Vitals Entered By: Kem Parkinson (August 16, 2010 11:09 AM)  Physical Exam  General:  Affect appropriate Healthy:  appears stated age HEENT: normal Neck  supple with no adenopathy JVP normal no bruits no thyromegaly Lungs clear with no wheezing and good diaphragmatic motion Heart:  S1/S2 systolic murmur, no rub, gallop or click PMI normal Abdomen: benighn, BS positve, no tenderness, no AAA no bruit.  No HSM or HJR Distal pulses intact with no bruits No edema Neuro non-focal Skin warm and dry    Impression & Recommendations:  Problem # 1:  CAD, ARTERY BYPASS GRAFT (ICD-414.04) STable no angna His updated medication list for this problem includes:    Metoprolol Succinate 50 Mg Xr24h-tab (Metoprolol succinate) .Marland Kitchen... 1 1/2 tab by mouth  two times a day    Warfarin Sodium 5 Mg Tabs (Warfarin sodium) .Marland Kitchen... As directed    Aspirin 81 Mg Tbec (Aspirin) .Marland Kitchen... Take one tablet by mouth daily  Problem # 2:  AORTIC VALVE REPLACEMENT, HX OF (ICD-V43.3) No change in murmur F/U echo 1-2 years or if exam or symptoms change  Problem # 3:  COUMADIN THERAPY (ICD-V58.61) Caustiously have increased dose given history of flank and GI bleeding.  F/U INR in [place] next week  Problem # 4:  ATRIAL FIBRILLATION (ICD-427.31) Failed DCC, rate controll adequate.   His updated medication list for this problem includes:    Metoprolol Succinate 50 Mg Xr24h-tab (Metoprolol succinate) .Marland Kitchen... 1 1/2 tab by mouth two times a day    Warfarin Sodium 5 Mg Tabs (Warfarin sodium) .Marland Kitchen... As directed    Aspirin 81 Mg Tbec (Aspirin) .Marland Kitchen... Take one tablet by mouth daily  Patient Instructions: 1)  Your physician recommends that you schedule a follow-up appointment in: 6 MONTHS--don't forget to go to coumadin clinic next week

## 2011-01-23 NOTE — Letter (Signed)
Summary: Appointment - Reminder 2  [facility], Main Office  [address]   [place], [place] [address]   Phone: [phone]  Fax: [phone]     March 08, 2010 MRN: [id number]   Kyle Love [address] [place], [place]  [address]   Dear Kyle Love,  Our records indicate that it is time to schedule a follow-up appointment with Dr. [name]. It is very important that we reach you to schedule this appointment. We look forward to participating in your health care needs. Please contact us at the number listed above at your earliest convenience to schedule your appointment.  If you are unable to make an appointment at this time, give [place] a call so we can update our records.     Sincerely,   Migdalia Dk [facility] Scheduling Team

## 2011-01-23 NOTE — Procedures (Signed)
Summary: Colonoscopy  Patient: [name] Note: All result statuses are Final unless otherwise noted.  Tests: (1) Colonoscopy (COL)   COL Colonoscopy           DONE     [facility]     [address]     [place], [place]  [address]           COLONOSCOPY PROCEDURE REPORT           PATIENT:  Kyle, Love  MR#:  [id number]     BIRTHDATE:  08/11/1941, 68 yrs. old  GENDER:  male     ENDOSCOPIST:  Iva Boop, MD, [facility]     REF. BY:  Charlton Haws, M.D.     PROCEDURE DATE:  05/03/2010     PROCEDURE:  Colonoscopy 295[address]     ASA CLASS:  Class III     INDICATIONS:  hematochezia in the setting of warfarin therapy for     atrial fibrillation     has acute blood loss anemia also     MEDICATIONS:   Fentanyl 50 mcg IV, Versed 6 mg IV           DESCRIPTION OF PROCEDURE:   After the risks benefits and     alternatives of the procedure were thoroughly explained, informed     consent was obtained.  Digital rectal exam was performed and     revealed no abnormalities.   The [id number] ([id number]) endoscope was     introduced through the anus and advanced to the cecum, which was     identified by both the appendix and ileocecal valve, without     limitations.  The quality of the prep was adequate, using Colyte.     The instrument was then slowly withdrawn as the colon was fully     examined. Insertion 2:50 mins withdrawal: 10 mins     <<PROCEDUREIMAGES>>           FINDINGS:  Moderate diverticulosis was found in the sigmoid colon.     This was otherwise a normal examination of the colon. No blood was     seen.  Retroflexed views in the rectum revealed internal     hemorrhoids.    The scope was then withdrawn from the patient and     the procedure completed.           COMPLICATIONS:  None     ENDOSCOPIC IMPRESSION:     1) Moderate diverticulosis in the sigmoid colon - clinical     evidence suports this as source of bleeding which has resolbved     2) Internal  hemorrhoids     3) Otherwise normal examination     RECOMMENDATIONS:     Observe, serial Hgb     if no further bleeding home in 24-48 hrs     stay off warfarin for 2 weeks then resume     I think aspirin therapy is reasonable at 81 mg a day in the     interim     rooutine GI follow-up with Dr. Arman Bogus, MD, [facility]           CC:  Wendall Stade, MD     R. Roetta Sessions, MD     The Patient           n.     eSIGNED:   Iva Boop at  05/03/2010 05:43 PM           Rennie Natter, [id number]  Note: An exclamation mark (!) indicates a result that was not dispersed into the flowsheet. Document Creation Date: 05/03/2010 5:44 PM _______________________________________________________________________  (1) Order result status: Final Collection or observation date-time: 05/03/2010 17:34 Requested date-time:  Receipt date-time:  Reported date-time:  Referring Physician:   Ordering Physician: Stan Head [phone]) Specimen Source:  Source: Launa Grill Order Number: [id number] Lab site:

## 2011-01-23 NOTE — Assessment & Plan Note (Signed)
Summary: eph/ gd   Visit Type:  eph Primary Provider:  Dr Chanetta Marshall   History of Present Illness: Kyle Love is seen todahy for F/U of AVR, afib and coumadin Rx.  He was recently hospitalized for LGI bleed with benign colonoscopy.  Likely diverticular blleed.  He did require two units of blood.  He has been back on coumadin since Friday.  His afib is fairly well rate controlled on 75 Toprol two times a day.  His stools are "green" and no longer tarry.  He has no SSCP, syncope, dyspnea or palpitatoins  Current Problems (verified): 1)  Bee Sting Allergy  (ICD-V15.06) 2)  COPD  (ICD-496) 3)  Cad, Artery Bypass Graft  (ICD-414.04) 4)  Aortic Valve Replacement, Hx of  (ICD-V43.3) 5)  Coumadin Therapy  (ICD-V58.61) 6)  Atrial Fibrillation  (ICD-427.31)  Current Medications (verified): 1)  Vitamin C 1000 Mg Tabs (Ascorbic Acid) .Marland Kitchen.. 1 Tab By Mouth Two Times A Day 2)  B12 .Marland Kitchen.. 1 Tab By Mouth Once Daily 3)  Folic Acid 400 Mcg Tabs (Folic Acid) .Marland Kitchen.. 1 Tab Once Daily 4)  Metoprolol Succinate 50 Mg Xr24h-Tab (Metoprolol Succinate) .Marland Kitchen.. 1 1/2 Tab By Mouth Two Times A Day 5)  Vitamin E 400 Unit Caps (Vitamin E) .Marland Kitchen.. 1 Tab By Mouth Two Times A Day 6)  Warfarin Sodium 5 Mg Tabs (Warfarin Sodium) .... As Directed 7)  Stool Softner .... As Needed 8)  Ferrous Sulfate 325 (65 Fe) Mg Tabs (Ferrous Sulfate) .Marland Kitchen.. 1 Tab Two Times A Day 9)  Aspirin 81 Mg Tbec (Aspirin) .... Take One Tablet By Mouth Daily 10)  Pantoprazole Sodium 40 Mg Tbec (Pantoprazole Sodium) .Marland Kitchen.. 1 Tab Once Daily  Allergies: 1)  ! Cardizem Cd (Diltiazem Hcl Coated Beads) 2)  ! * Pcn  Past History:  Past Medical History: Last updated: 03/31/2009 COPD (ICD-496) CAD, ARTERY BYPASS GRAFT (ICD-414.04) AORTIC VALVE REPLACEMENT, HX OF (ICD-V43.3) COUMADIN THERAPY (ICD-V58.61) ATRIAL FIBRILLATION (ICD-427.31)    Past Surgical History: Last updated: 03/31/2009 The patient received a 27 mm Medtronic freestyle aortic       root heart valve  prosthesis in April 2008.   hemorrhoid  Family History: Last updated: 04/29/2009 non-contributory  Social History: Last updated: 04/29/2009 Full Time Married  celebrated 50tj aniv 2010 Tobacco Use - Yes.  Alcohol Use - no Drug Use - no Enjoys golf  Review of Systems       Denies fever, malais, weight loss, blurry vision, decreased visual acuity, cough, sputum, SOB, hemoptysis, pleuritic pain, palpitaitons, heartburn, abdominal pain, melena, lower extremity edema, claudication, or rash.   Vital Signs:  Patient profile:   70 year old male Height:      72 inches Weight:      231 pounds BMI:     31.44 Pulse rate:   114 / minute Pulse rhythm:   irregular BP sitting:   147 / 88  (left arm) Cuff size:   large  Vitals Entered By: Danielle Rankin, CMA (May 15, 2010 9:45 AM)  Physical Exam  General:  Affect appropriate Healthy:  appears stated age HEENT: normal Neck supple with no adenopathy JVP normal no bruits no thyromegaly Lungs clear with no wheezing and good diaphragmatic motion Heart:  S1/S2 systolic murmur,rub, gallop or click PMI normal Abdomen: benighn, BS positve, no tenderness, no AAA no bruit.  No HSM or HJR Distal pulses intact with no bruits No edema Neuro non-focal Skin warm and dry    Impression & Recommendations:  Problem #  1:  CAD, ARTERY BYPASS GRAFT (ICD-414.04) No angina stable The following medications were removed from the medication list:    Aspirin 325 Mg Tabs (Aspirin) .Marland Kitchen... Tab by mouth once daily His updated medication list for this problem includes:    Metoprolol Succinate 50 Mg Xr24h-tab (Metoprolol succinate) .Marland Kitchen... 1 1/2 tab by mouth two times a day    Warfarin Sodium 5 Mg Tabs (Warfarin sodium) .Marland Kitchen... As directed    Aspirin 81 Mg Tbec (Aspirin) .Marland Kitchen... Take one tablet by mouth daily  Orders: EKG w/ Interpretation ([address])  Problem # 2:  AORTIC VALVE REPLACEMENT, HX OF (ICD-V43.3) Normal sounding.  Tissue.  Problem # 3:  COUMADIN  THERAPY (ICD-V58.61) Resumed post LGI bleed.  F/U INR in 2 weeks.  Problem # 4:  ATRIAL FIBRILLATION (ICD-427.31) May need to add digoxen or cardizem back.  Will follow rate The following medications were removed from the medication list:    Aspirin 325 Mg Tabs (Aspirin) .Marland Kitchen... Tab by mouth once daily His updated medication list for this problem includes:    Metoprolol Succinate 50 Mg Xr24h-tab (Metoprolol succinate) .Marland Kitchen... 1 1/2 tab by mouth two times a day    Warfarin Sodium 5 Mg Tabs (Warfarin sodium) .Marland Kitchen... As directed    Aspirin 81 Mg Tbec (Aspirin) .Marland Kitchen... Take one tablet by mouth daily  Orders: EKG w/ Interpretation ([address])  Patient Instructions: 1)  Your physician recommends that you schedule a follow-up appointment in: 3 months with Dr [name] 2)  Your physician recommends that you continue on your current medications as directed. Please refer to the Current Medication list given to you today. 3)  You have been referred to Dr Ellison Carwin to follow up GI Bleed 4)  Your physician recommends that [facility]  lab work in:  2 weeks H/H and PT/INR   EKG Report  Procedure date:  05/15/2010  Findings:      [id number] Nonspecific ST/ T wave abnormality

## 2011-01-23 NOTE — Progress Notes (Signed)
  Phone Note Outgoing Call   Call placed by: Ollen Gross, RN, BSN,  May 30, 2010 12:42 PM Call placed to: Patient Details for Reason: INR F/U Summary of Call: To find out where pt. has INR/coumodin dosage. Initial call taken by: Ollen Gross, RN, BSN,  May 30, 2010 12:44 PM  Follow-up for Phone Call        [facility]. Ollen Gross, RN, BSN  May 30, 2010 12:44 PM  Pt's wife called back. She states Dr.Latavius Capizzi does the Coumodin dosing, and Stanton Kidney  calls back with recomendations. Follow-up by: Ollen Gross, RN, BSN,  May 30, 2010 12:53 PM

## 2011-01-23 NOTE — Letter (Signed)
Summary: CONSULTATION  CONSULTATION   Imported By: Rexene Alberts 08/10/2010 09:56:37  _____________________________________________________________________  External Attachment:    Type:   Image     Comment:   External Document

## 2011-01-23 NOTE — Progress Notes (Signed)
Summary: question on meds  Phone Note Call from Patient Call back at Home Phone [phone] Call back at [phone]   Caller: Spouse/jean Reason for Call: Talk to Nurse Summary of Call: six week ago pt started to smoke again. pt has question on meds. no chest pain/sob. pt stomach starts to hurt/move when he goes to sleep. Initial call taken by: Roe Coombs,  November 13, 2010 11:04 AM  Follow-up for Phone Call        Spoke with pt's wife  pt c/o stomach issues has not slept well over the past couple of days  appt has been made with  pmd  for 11/17/10 Per wife color looks good ,no bleedeing noted to stool ,and no c/o fatigue played 18 rounds of golf yesterday.Please advise. Follow-up by: Scherrie Bateman, LPN,  November 13, 2010 2:13 PM  Additional Follow-up for Phone Call Additional follow up Details #1::        Left message to call back Deliah Goody, RN  November 14, 2010 10:28 AM  spoke with pt, he is feeling better. he has reduce the amount of food he is eating prior to bedtime and that has helped with the stomach issues he is having. he states his biggest problem is not being able to sleep. he will dicuss with his primary md on friday Deliah Goody, RN  November 15, 2010 2:53 PM

## 2011-01-23 NOTE — Progress Notes (Signed)
Summary: refill request  Phone Note Refill Request Message from:  Patient on September 11, 2010 1:51 PM  Refills Requested: Medication #1:  METOPROLOL SUCCINATE 50 MG XR24H-TAB 1 1/2 tab by mouth two times a day prescription solutions   Method Requested: Telephone to Pharmacy Initial call taken by: Glynda Jaeger,  September 11, 2010 1:51 PM    Prescriptions: METOPROLOL SUCCINATE 50 MG XR24H-TAB (METOPROLOL SUCCINATE) 1 1/2 tab by mouth two times a day  #180 x 3   Entered by:   Kem Parkinson   Authorized by:   Colon Branch, MD, [facility]   Signed by:   Kem Parkinson on 09/12/2010   Method used:   Electronically to        [facility]* (mail-order)       [address], [place]  [address]       Ph: [phone]       Fax: [phone]   RxID:   [id number]

## 2011-01-23 NOTE — Progress Notes (Signed)
Summary: bleeding from rectum  Phone Note Call from Patient Call back at Home Phone [phone]   Caller: Spouse jean  Reason for Call: Talk to Nurse Details for Reason: c/o bleeding from rectum only when he goes to bathroom.  Initial call taken by: Lorne Skeens,  May 01, 2010 12:36 PM  Follow-up for Phone Call        Pt wife calling back on the way to take pt to the ED call back #[phone] Donnella Sham [facility]  May 01, 2010 3:42 PM  Left message to call back Deliah Goody, RN  May 01, 2010 4:22 PM  spoke with pt wife, pt started yesterday with 2 stools containing alot of blood. she also states he had 3 more today. they are at [facility] er for eval Deliah Goody, RN  May 01, 2010 4:27 PM

## 2011-01-23 NOTE — Progress Notes (Signed)
Summary: pt in [facility]  Phone Note Call from Patient Call back at cell# [phone] Room# [phone]   Caller: Daughter Reason for Call: Talk to Nurse, Talk to Doctor Summary of Call: per daughter call pt is at [facility] in room # 231 and they are trying to take him off coumadin and other meds and per daughter they where told to contact Dr. [name] right away if this ever happens again. Initial call taken by: Omer Jack,  May 02, 2010 10:11 AM  Follow-up for Phone Call        spoke with dr [name], he is aware of the admission. spoke with pt wife arrangements will be made for pt to be transferred to [place]. dr [name] has discussed with dr Sherrie Mustache. GI consult has been done. Deliah Goody, RN  May 02, 2010 10:50 AM

## 2011-01-23 NOTE — Letter (Signed)
Summary: Recall, Labs Needed  [facility]  [address]   [place], [place] [address]   Phone: [phone]  Fax: [phone]    June 09, 2010  DELANO FRATE [address] [place], [place]  [address] November 17, 1941   Dear Mr. Wilhoite,   Our records indicate it is time to repeat your blood work.  You can take the enclosed form to the lab on or near the date indicated.  Please make note of the new location of the lab:   [address], 2nd floor   [facility]  Our office will call you within a week to ten business days with the results.  If you do not hear from [place] in 10 business days, you should call the office.  If you have any questions regarding this, call the office at [phone], and ask for the nurse.  Labs are due on 06/28/2010.   Sincerely,    Hendricks Limes LPN  [facility] Ph: [phone]   Fax: [phone]

## 2011-01-23 NOTE — Assessment & Plan Note (Signed)
Summary: GI BLEED RECENTLY IN [place],TOLD TO MAKE FU IN 2 WKS WITH RM...   Visit Type:  f/u Primary Care Provider:  [facility]  Chief Complaint:  follow up from [facility]- gi bleed. doing good now.  History of Present Illness: Mr. Eplin is here for f/u. He was seen in consultation on 05/02/10 at [facility] when he presents with lower GI bleed. Patient has h/o AVR with maze procedure/ascending aortic aneurysm repair in 2008 on chronic coumadin therapy. He was transferred to [facility] to be near his cardiologist. Dr. Stan Head performed TCS. He was found to have sigmoid diverticulosis but no evidence of ongoing active GI bleed. He was suspected to have had a diverticular bleed. At time of D/C, his Hgb was 8.4. He received one unit of prbcs during hospitalization. He has since resumed his Coumadin.   He feels well. No further bleeding. BM regular. No abd pain. No GERD, n/v. He is supposed to have H/H done on Monday.   Current Medications (verified): 1)  Vitamin C 1000 Mg Tabs (Ascorbic Acid) .Marland Kitchen.. 1 Tab By Mouth Two Times A Day 2)  B12 .Marland Kitchen.. 1 Tab By Mouth Once Daily 3)  Folic Acid 400 Mcg Tabs (Folic Acid) .Marland Kitchen.. 1 Tab Once Daily 4)  Metoprolol Succinate 50 Mg Xr24h-Tab (Metoprolol Succinate) .Marland Kitchen.. 1 1/2 Tab By Mouth Two Times A Day 5)  Vitamin E 400 Unit Caps (Vitamin E) .Marland Kitchen.. 1 Tab By Mouth Two Times A Day 6)  Warfarin Sodium 5 Mg Tabs (Warfarin Sodium) .... As Directed 7)  Stool Softner .... As Needed 8)  Ferrous Sulfate 325 (65 Fe) Mg Tabs (Ferrous Sulfate) .Marland Kitchen.. 1 Tab Two Times A Day 9)  Aspirin 81 Mg Tbec (Aspirin) .... Take One Tablet By Mouth Daily 10)  Pantoprazole Sodium 40 Mg Tbec (Pantoprazole Sodium) .Marland Kitchen.. 1 Tab Once Daily  Allergies (verified): 1)  ! Cardizem Cd (Diltiazem Hcl Coated Beads) 2)  ! * Pcn  Review of Systems      See HPI  Vital Signs:  Patient profile:   70 year old male Weight:      228 pounds Temp:     98.2 degrees F oral Pulse rate:   76 /  minute BP sitting:   132 / 80  Vitals Entered By: Leanna Battles. Baleigh Rennaker PA-C (May 26, 2010 11:58 AM)  Physical Exam  General:  Well developed, well nourished, no acute distress. Eyes:  sclera nonicteric Mouth:  op moist. Abdomen:  soft Extremities:  No clubbing, cyanosis, edema or deformities noted. Neurologic:  Alert and  oriented x4;  grossly normal neurologically. Skin:  Intact without significant lesions or rashes. Psych:  Alert and cooperative. Normal mood and affect.  Impression & Recommendations:  Problem # 1:  DIVERTICULAR BLEEDING, HX OF (ICD-V12.79)  Recent suspected diverticular bleed. Doing well. Will f/u H/H scheduled for Monday. He will complete current ferrous sulfate RX and then stop. His next TCS is now due in 04/2015 for surveillance of colon polyps. He is to call or sent emergency medical attention is he develops recurrent bleeding.   Orders: Est. Patient Level II ([address])  Appended Document: GI BLEED RECENTLY IN [place],TOLD TO MAKE FU IN 2 WKS WITH RM... reminder in computer

## 2011-01-23 NOTE — Assessment & Plan Note (Signed)
Summary: F6M/DM   Visit Type:  Follow-up Primary Provider:  Dr Chanetta Marshall  CC:  no complaints.  History of Present Illness: Carr is seen today for F/U of AVR, afib with failed DCC x3 and anticoagulation.  He just returned from a fabulous trip to [place], and [place].  Skip was very active with no SOB, palpitatoins, or SSCP.  I follow his INR.  Today it was 1.9.  He has a huge psoas hematoma erlier this year   This has healed well and multiple INR's have been hovering around 1.9-2.1.  and we increased his coumadin slightly to 10mg  4x/week and 7.5 mg the remainder. His wife will need some diverticular surgery soon and I spoke with her on the phone today  Current Problems (verified): 1)  Bee Sting Allergy  (ICD-V15.06) 2)  COPD  (ICD-496) 3)  Cad, Artery Bypass Graft  (ICD-414.04) 4)  Aortic Valve Replacement, Hx of  (ICD-V43.3) 5)  Coumadin Therapy  (ICD-V58.61) 6)  Atrial Fibrillation  (ICD-427.31)  Current Medications (verified): 1)  Vitamin C 1000 Mg Tabs (Ascorbic Acid) .Marland Kitchen.. 1 Tab By Mouth Two Times A Day 2)  B12 .Marland Kitchen.. 1 Tab By Mouth Once Daily 3)  Folic Acid .Marland Kitchen.. 1 Tab By Mouth Once Daily 4)  Aspirin 325 Mg  Tabs (Aspirin) .... Tab By Mouth Once Daily 5)  Metoprolol Succinate 50 Mg Xr24h-Tab (Metoprolol Succinate) .Marland Kitchen.. 1 1/2 Tab By Mouth Once Daily 6)  Vitamin E 400 Unit Caps (Vitamin E) .Marland Kitchen.. 1 Tab By Mouth Two Times A Day 7)  Warfarin Sodium 5 Mg Tabs (Warfarin Sodium) .... As Directed 8)  Stool Softner .... As Needed  Allergies (verified): 1)  ! Cardizem Cd (Diltiazem Hcl Coated Beads) 2)  ! * Pcn  Past History:  Past Medical History: Last updated: 03/31/2009 COPD (ICD-496) CAD, ARTERY BYPASS GRAFT (ICD-414.04) AORTIC VALVE REPLACEMENT, HX OF (ICD-V43.3) COUMADIN THERAPY (ICD-V58.61) ATRIAL FIBRILLATION (ICD-427.31)    Past Surgical History: Last updated: 03/31/2009 The patient received a 27 mm Medtronic freestyle aortic       root heart valve prosthesis in April  2008.   hemorrhoid  Family History: Last updated: 04/29/2009 non-contributory  Social History: Last updated: 04/29/2009 Full Time Married  celebrated 50tj aniv 2010 Tobacco Use - Yes.  Alcohol Use - no Drug Use - no Enjoys golf  Review of Systems       Denies fever, malais, weight loss, blurry vision, decreased visual acuity, cough, sputum, SOB, hemoptysis, pleuritic pain, palpitaitons, heartburn, abdominal pain, melena, lower extremity edema, claudication, or rash.   Vital Signs:  Patient profile:   70 year old male Height:      72 inches Weight:      229 pounds BMI:     31.17 Pulse rate:   88 / minute Pulse rhythm:   irregularly irregular Resp:     12 per minute BP sitting:   140 / 80  (right arm) Cuff size:   large  Vitals Entered By: Burnett Kanaris, CNA (Apr 27, 2010 3:35 PM)  Physical Exam  General:  Affect appropriate Healthy:  appears stated age HEENT: normal Neck supple with no adenopathy JVP normal no bruits no thyromegaly Lungs clear with no wheezing and good diaphragmatic motion Heart:  S1/S2  AVR murmur no rub, gallop or click PMI normal Abdomen: benighn, BS positve, no tenderness, no AAA no bruit.  No HSM or HJR Distal pulses intact with no bruits Trace  edema Neuro non-focal Skin warm and dry  Impression & Recommendations:  Problem # 1:  CAD, ARTERY BYPASS GRAFT (ICD-414.04) Stable no angina His updated medication list for this problem includes:    Aspirin 325 Mg Tabs (Aspirin) .Marland Kitchen... Tab by mouth once daily    Metoprolol Succinate 50 Mg Xr24h-tab (Metoprolol succinate) .Marland Kitchen... 1 1/2 tab by mouth once daily    Warfarin Sodium 5 Mg Tabs (Warfarin sodium) .Marland Kitchen... As directed  Problem # 2:  AORTIC VALVE REPLACEMENT, HX OF (ICD-V43.3) Normal exam.  Continue SBE prophylaxis  Problem # 3:  ATRIAL FIBRILLATION (ICD-427.31) Good rate control asymptomatic  Failed multiple previous attempts at [facility] His updated medication list for this problem  includes:    Aspirin 325 Mg Tabs (Aspirin) .Marland Kitchen... Tab by mouth once daily    Metoprolol Succinate 50 Mg Xr24h-tab (Metoprolol succinate) .Marland Kitchen... 1 1/2 tab by mouth once daily    Warfarin Sodium 5 Mg Tabs (Warfarin sodium) .Marland Kitchen... As directed  Problem # 4:  COUMADIN THERAPY (ICD-V58.61) Hisotry of sponataneous iliopsoas bleed.  Slight increase in dose last week. target INR 2.0-2.5  Patient Instructions: 1)  Your physician recommends that you schedule a follow-up appointment in: with Dr. [name] in 6 months 2)  Your physician recommends that you continue on your current medications as directed. Please refer to the Current Medication list given to you today.

## 2011-01-23 NOTE — Progress Notes (Signed)
  Phone Note Call from Patient   Reason for Call: Talk to Nurse, Lab or Test Results Summary of Call: Pt's wife called to let us know that Kyle Love had his lab work done and his hemoglobin was 11. Initial call taken by: Diana Eves,  May 30, 2010 1:18 PM     Appended Document:  Reviewed labs. Hgb 11.4. He can finish out his iron therapy. Call with any further bleeding. Recommend one last Hgb in four weeks.  Appended Document:  tried to call pt- LMOM  Appended Document:  pt aware, lab order on file

## 2011-01-24 LAB — CONVERTED CEMR LAB
Basophils Relative: 0 % (ref 0–1)
INR: 1.5 — ABNORMAL HIGH (ref <1.50)
Lymphs Abs: 1.2 10*3/uL (ref 0.7–4.0)
Monocytes Relative: 11 % (ref 3–12)
Neutro Abs: 3.3 10*3/uL (ref 1.7–7.7)
Neutrophils Relative %: 61 % (ref 43–77)
Platelets: 296 10*3/uL (ref 150–400)
Prothrombin Time: 18.3 s — ABNORMAL HIGH (ref 11.6–15.2)
RBC: 4 M/uL — ABNORMAL LOW (ref 4.22–5.81)
WBC: 5.3 10*3/uL (ref 4.0–10.5)

## 2011-01-25 ENCOUNTER — Ambulatory Visit: Admit: 2011-01-25 | Payer: Self-pay | Admitting: Gastroenterology

## 2011-01-25 ENCOUNTER — Ambulatory Visit (INDEPENDENT_AMBULATORY_CARE_PROVIDER_SITE_OTHER): Payer: MEDICARE | Admitting: Gastroenterology

## 2011-01-25 ENCOUNTER — Encounter: Payer: Self-pay | Admitting: Gastroenterology

## 2011-01-25 DIAGNOSIS — D649 Anemia, unspecified: Secondary | ICD-10-CM

## 2011-01-25 DIAGNOSIS — D509 Iron deficiency anemia, unspecified: Secondary | ICD-10-CM | POA: Insufficient documentation

## 2011-01-25 NOTE — Discharge Summary (Signed)
NAME:  Kyle Love, RASTETTER NO.:  [id number]  MEDICAL RECORD NO.:  [id number]          PATIENT TYPE:  INP  LOCATION:  4739                         FACILITY:  [facility]  PHYSICIAN:  Noralyn Pick. [name], MD, FACCDATE OF BIRTH:  1941/06/05  DATE OF ADMISSION:  01/05/2011 DATE OF DISCHARGE:  01/09/2011                              DISCHARGE SUMMARY   PRIMARY CARDIOLOGIST:  Theron Arista C. [name], MD, [facility]  PRIMARY CARE PHYSICIAN:  [name], MD  PRIMARY GASTROENTEROLOGIST:  Jonathon Bellows, MD  DISCHARGE DIAGNOSIS:  Acute diastolic congestive heart failure.  SECONDARY DIAGNOSES: 1. Iron-deficiency anemia status post 1 unit packed red blood cells     this admission. 2. Chronic atrial fibrillation, on chronic Coumadin therapy. 3. Ongoing tobacco abuse. 4. Chronic obstructive pulmonary disease. 5. 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 6. History of nonobstructive coronary artery disease. 7. History of left psoas muscle hematoma, March 2010. 8. History of diverticulosis by colonoscopy, May 2011. 9. History of sigmoid polyp, multifocal, intramucosal adenocarcinoma     1996. 10.Mild obstructive sleep apnea.  ALLERGIES:  DILTIAZEM CD, PENICILLIN, and PLATELETS.  PROCEDURES:  EGD performed on January 07, 2011, which was normal and no evidence of bleeding.  Recommendation for capsule endoscopy deferred to Dr. Jena Gauss in the outpatient setting.  HISTORY OF PRESENT ILLNESS:  A 70 year old male with prior history of nonobstructive CAD, AFib, and aortic root replacement with bioprosthetic aortic valve replacement in 2008 who was seen in clinic on January 05, 2011, secondary to progressive dyspnea.  He reported chronic edema as well as a recent 10-pound weight gain.  He was felt to be volume overloaded on exam, placed on oral Lasix therapy with plan to follow up in a few days.  The patient had lab work drawn revealing a  hemoglobin 7.6 with hematocrit of 24.7, and the patient was contacted and advised to present for admission to [facility].  HOSPITAL COURSE:  Following admission, typed and cross was sent and the patient was transfused with 1 unit of packed red blood cells.  Prior to transfusion, iron studies were obtained and he is noted to have serum iron of 21, ferritin of 29, and a TIBC of 324.  B12 and folate were within normal limits.  As the patient had prior history of diverticulosis with lower GI bleeding in May 2011, Gastroenterology was consulted.  They recommended EGD and holding of Coumadin.  EGD showed normal anatomy without evidence of bleed.  His stool was guaiac negative as well.  It was felt that the patient may benefit from a capsule endoscopy.  The decision has been deferred to the outpatient setting to the patient's primary gastroenterologist, Dr. Roetta Sessions.  Since transfusion, the patient's H and H has remained stable, and is 9.1 and 29.7 today.  As noted above, the patient has been having progressive dyspnea and is likely that his anemia may have been a partial culprit, which is also volume overload on exam.  A 2D echocardiogram was undertaken on January 05, 2011, showing an EF of 55-65% with normal functioning bioprosthetic aortic valve  and mild-to-moderate mitral regurgitation.  There is also moderate tricuspid regurgitation.  The patient was placed on b.i.d. IV Lasix, diuresed approximately 4 liters with reduction in weight from 100 kg on admission to 96.4 kg on January 08, 2011.  The patient has had significant symptomatic improvement from the dyspnea standpoint.  The patient will be discharged home today in good condition.  We will plan to hold his Coumadin for 1 additional week and then follow up his INR in 2 weeks.  He has been asked to follow up with Dr. Jena Gauss for consideration of capsule endoscopy.  DISCHARGE LABORATORY DATA:  Hemoglobin 9.1, hematocrit 29.7, WBC  7.1, platelets 325.  Sodium 137, potassium 4.1, chloride 99, CO2 of 32, BUN 21, creatinine 0.2, glucose 110, calcium 8.8.  BNP was 131 on January 06, 2011.  Serum iron 21, TIBC 324, percent saturation 6.  Vitamin B12 of 704, folate 1039, ferritin 29.  Fecal occult blood was negative.  DISPOSITION:  The patient will be discharged home today in good condition.  FOLLOWUP PLANS AND APPOINTMENTS:  The patient will follow up Dr. Jena Gauss within the next 1-2 weeks.  We have arranged for followup CBC and INR on January 23, 2011.  Follow up with Dr. [name] on February 72m, 2012, at 3:00 p.m. or sooner if necessary.  He will follow up with Dr. [name] as previously scheduled.  DISCHARGE MEDICATIONS: 1. Alprazolam 0.25 mg b.i.d. p.r.n. 2. Iron complex 150 mg daily. 3. Lasix 40 mg daily. 4. Protonix 40 mg daily. 5. K-Dur 10 mEq daily. 6. Coumadin 5 mg 2 tablets Monday, Tuesday, Wednesday, Thursday, and     Saturday, 1-1/2 tablets Friday and Sunday.  The patient will resume     on January 16, 2011. 7. Docusate 100 mg b.i.d. p.r.n. 8. Folic acid 400 mcg daily. 9. Toprol-XL 50 mg 1-1/2 tablets b.i.d. 10.Vitamin B12 1000 mcg daily. 11.Vitamin E 400 units b.i.d. 12.Vitamin C 1000 mg b.i.d.  OUTSTANDING LABORATORY STUDIES:  The patient will have CBC and INR on January 23, 2011.  DURATION OF DISCHARGE ENCOUNTER:  60 minutes including physician time.     Nicolasa Ducking, ANP   ______________________________ Noralyn Pick. [name], MD, [facility]    CB/MEDQ  D:  01/09/2011  T:  01/10/2011  Job:  [id number]  cc:   R. Roetta Sessions, M.D. [name], M.D.  Electronically Signed by Nicolasa Ducking ANP on 01/22/2011 12:23:17 PM Electronically Signed by Charlton Haws MD [facility] on 01/25/2011 10:37:06 PM

## 2011-01-25 NOTE — Assessment & Plan Note (Signed)
Summary: ROV/SOB/DM   Visit Type:  Follow-up Primary Provider:  [facility]   History of Present Illness: Primary Cardiologist:  Dr. Charlton Haws  Kyle Love is a 70 yo male with a h/o mild, nonobstructive CAD by catheterization in April 2008, permanent atrial fibrillation on chronic Coumadin therapy (failed cardioversion in the past; status post Cox-Maze procedure at AVR in 2008), status post aortic valve and aortic root replacement in 2008 secondary to an ascending aortic aneurysm and bicuspid aortic valve resulting in aortic insufficiency and aortic stenosis.  He has a history of lower GI bleeding from diverticulitis as recently as May of 2011.  He also has COPD.  He called in recently with complaints of shortness of breath and was put on my schedule for evaluation.  He has had a significant amount of trouble sleeping since Oct 2011.  He remains restless all night.    He recently started smoking again.  He went to [place] earlier this week (2 hour drive).  On the way back, he stopped to go to the restroom.  He became very short of breath for 2-3 minutes after getting out of the truck.  No chest pain or near syncope.  He felt fine afterwards.  He had one more episode in a similar situation.  He denies exertional chest pain or dyspnea.  He has chronic edema.  He states he recently gained about 10 pounds over several days.  He denies orthopnea or PND.  He denies syncope or palps.  No hemoptysis.  No pleuritic chest pain.  Problems Prior to Update: 1)  Shortness of Breath  (ICD-786.05) 2)  Diverticular Bleeding, Hx of  (ICD-V12.79) 3)  Renal Calculus, Hx of  (ICD-V13.01) 4)  Hx of Colon Cancer  (ICD-153.9) 5)  Bee Sting Allergy  (ICD-V15.06) 6)  COPD  (ICD-496) 7)  Cad, Artery Bypass Graft  (ICD-414.04) 8)  Aortic Valve Replacement, Hx of  (ICD-V43.3) 9)  Coumadin Therapy  (ICD-V58.61) 10)  Atrial Fibrillation  (ICD-427.31)  Current Medications (verified): 1)   Vitamin C 1000 Mg Tabs (Ascorbic Acid) .Marland Kitchen.. 1 Tab By Mouth Two Times A Day 2)  B12 .Marland Kitchen.. 1 Tab By Mouth Once Daily 3)  Folic Acid 400 Mcg Tabs (Folic Acid) .Marland Kitchen.. 1 Tab Once Daily 4)  Metoprolol Succinate 50 Mg Xr24h-Tab (Metoprolol Succinate) .Marland Kitchen.. 1 1/2 Tab By Mouth Two Times A Day 5)  Vitamin E 400 Unit Caps (Vitamin E) .Marland Kitchen.. 1 Tab By Mouth Two Times A Day 6)  Warfarin Sodium 5 Mg Tabs (Warfarin Sodium) .... As Directed 7)  Stool Softner .... As Needed 8)  Aspirin 81 Mg Tbec (Aspirin) .... Take One Tablet By Mouth Daily  Allergies: 1)  ! Cardizem Cd (Diltiazem Hcl Coated Beads) 2)  ! * Pcn  Past History:  Past Medical History: Last updated: 03/31/2009 COPD (ICD-496) CAD, ARTERY BYPASS GRAFT (ICD-414.04) AORTIC VALVE REPLACEMENT, HX OF (ICD-V43.3) COUMADIN THERAPY (ICD-V58.61) ATRIAL FIBRILLATION (ICD-427.31)    Family History: Reviewed history from 04/29/2009 and no changes required. non-contributory  Social History: Reviewed history from 04/29/2009 and no changes required. Full Time Married  celebrated 50tj aniv 2010 Tobacco Use - Yes. recently restarted (over 50 pk years) Alcohol Use - no Drug Use - no Enjoys golf  Review of Systems       He has had an increased cough with increased sputum production and wheezing.  Otherwise, as per  the HPI.  All other systems reviewed and negative.   Vital Signs:  Patient  profile:   70 year old male Height:      72 inches Weight:      229 pounds BMI:     31.17 Pulse rate:   112 / minute BP sitting:   144 / 80  (left arm)  Vitals Entered By: Laurance Flatten CMA (January 05, 2011 9:58 AM)  Physical Exam  General:  Well nourished, well developed, in no acute distress HEENT: normal Neck: + JVD Cardiac:  normal S1, S2; RRR; no murmur Lungs:  decreased breath sounds bilat; no wheezing; no obvious rales Abd: soft, nontender, no hepatomegaly Ext: 1+ bilat edema Vascular: no carotid  bruits Skin: warm and dry Neuro:  CNs 2-12  intact, no focal abnormalities noted    EKG  Procedure date:  01/05/2011  Findings:      This appears to be sinus tachycardia heart rate 112 He has some T-wave flattening in leads 2, 3, aVF and V6 No significant changes since previous tracing  Impression & Recommendations:  Problem # 1:  SHORTNESS OF BREATH (ICD-786.05) Etiology not entirely clear.  He does have several symptoms that would be consistent with a COPD exacerbation.  He is moving air well on exam without significant wheezing.  Oxygen saturations are 98 to 99% on room air.  His heart rate is elevated today which would be more consistent with this type of illness.  Therefore, I will treat him for a COPD exacerbation with doxycycline 100 mg b.i.d. for 7 days.  I have also given him a Ventolin inhaler to use p.r.n. Marland Kitchen . . (SEE BELOW REGARDING ANEMIA) since he is being admitted for poss GI bleed, will cancel his doxy Rx and give him a zpak in the hospital to cover for COPD exac.  He also has distended neck veins and increased lower extremity edema.  We will obtain a chest x-ray, basic metabolic panel and a BNP.  I will place him on Lasix 20 mg a day and potassium 10 mEq a day for 3 days.  If his BNP comes back significantly elevated, we will have him take this daily until followup. . . . (SEE BELOW REGARDING ANEMIA) since he is being admitted for poss. GI bleed, will give him one dose of IV Lasix and follow up on his volume status in the hospital.  I will stop his Lasix and K+ Rx sent to his pharmacy earlier.  Orders: T-2 View CXR ([id number]) Echocardiogram (Echo)  Problem # 2:  ATRIAL FIBRILLATION (ICD-427.31) I reviewed his electrocardiogram with Dr. Antoine Poche.  This appears to be sinus tachycardia.  It does not appear to be atrial fibrillation.  He remains on Coumadin.    SEE BELOW. . . his hgb is low.  He likely has a recurrent GI bleed.  His sinus tach is likely physiologic 2/2 his anemia.  Problem # 3:  DIVERTICULAR BLEEDING,  HX OF (ICD-V12.79) Check CBC. . . .   His Hgb is 7.6.  We were just called a critical result. His previous Hgb was normal a few months ago. Will admit him to the hospital, hold his coumadin. Type and cross 2 units. Transfuse x 1. Get GI consult.  Problem # 4:  COPD (ICD-496) As above, poss exacerbation Will tx with antibx and MDI.  His updated medication list for this problem includes:    Ventolin Hfa 108 (90 Base) Mcg/act Aers (Albuterol sulfate) .Marland Kitchen... Take one to 2 puffs every 6 hours as needed  Problem # 5:  AORTIC VALVE REPLACEMENT, HX  OF (ICD-V43.3) Will check echo. . . .done in the office today.  Orders: Echocardiogram (Echo)  Problem # 6:  Insomnia Will give him Ambien to use as needed   Other Orders: TLB-BMP (Basic Metabolic Panel-BMET) (80048-METABOL) TLB-BNP (B-Natriuretic Peptide) (83880-BNPR) TLB-CBC Platelet - w/Differential (85025-CBCD)  Patient Instructions: 1)  Your physician recommends that you schedule a follow-up appointment in: one week with Dr. [name] or Tereso Newcomer. 2)  Your physician has recommended you make the following change in your medication: Doxycyclin 100 mg take one tablet by mouth twice a day. Ventalin HFA 1 to 2 puffs every 4 to 6 hrs. as needed. Lasix 20 mg take one tablet by mouth daily for 3 days and Potassium 10 MEQ one tablet by mouth for 3 days. We will call you after labs results if to continue taken Lasix and Potassium. Ambien 5 mg take one tablet by mouth at bedtime. 3)  Your physician has requested that you have an echocardiogram.  Echocardiography is a painless test that uses sound waves to create images of your heart. It provides your doctor with information about the size and shape of your heart and how well your heart's chambers and valves are working.  This procedure takes approximately one hour. There are no restrictions for this procedure. Chest xray, Prescriptions: AMBIEN 5 MG TABS (ZOLPIDEM TARTRATE) Take 1 tab by mouth at  bedtime as needed for sleep  #10 x 0   Entered and Authorized by:   Tereso Newcomer PA-C   Signed by:   Tereso Newcomer PA-C on 01/05/2011   Method used:   Print then Give to Patient   RxID:   [id number] DOXYCYCLINE HYCLATE 100 MG TABS (DOXYCYCLINE HYCLATE) Take 1 tablet by mouth two times a day for 7 days  #14 x 0   Entered by:   Ollen Gross, RN, BSN   Authorized by:   Tereso Newcomer PA-C   Signed by:   Ollen Gross, RN, BSN on 01/05/2011   Method used:   Electronically to        [facility] [phone]* (retail)       [address]       [place], [place]  [address]       Ph: [phone]       Fax: [phone]   RxID:   [id number] VENTOLIN HFA 108 (90 BASE) MCG/ACT AERS (ALBUTEROL SULFATE) take one to 2 puffs every 6 hours as needed  #1 x 1   Entered by:   Ollen Gross, RN, BSN   Authorized by:   Tereso Newcomer PA-C   Signed by:   Ollen Gross, RN, BSN on 01/05/2011   Method used:   Electronically to        [facility] [phone]* (retail)       [address]       [place], [place]  [address]       Ph: [phone]       Fax: [phone]   RxID:   [id number] POTASSIUM CHLORIDE CR 10 MEQ CR-CAPS (POTASSIUM CHLORIDE) Take one tablet by mouth daily for 3 days.  #30 x 1   Entered by:   Ollen Gross, RN, BSN   Authorized by:   Tereso Newcomer PA-C   Signed by:   Ollen Gross, RN, BSN on 01/05/2011   Method used:   [facility]  [address] [phone]* (retail)       [address]       [place], [place]  [address]       Ph: [phone]       Fax: [phone]   RxID:   [id number] LASIX 20 MG TABS (FUROSEMIDE) take one tablet by mouth once a day for 3 days  #30 x 1   Entered by:   Ollen Gross, RN, BSN   Authorized by:   Tereso Newcomer PA-C   Signed by:   Ollen Gross, RN, BSN on 01/05/2011   Method used:   Electronically to        [facility] [phone]* (retail)       [address]       [place], [place]  [address]       Ph: [phone]       Fax: [phone]   RxID:   [id number]  I have personally reviewed the prescriptions today for accuracy.. . Tereso Newcomer PA-C  January 05, 2011 1:05 PM   Appended Document: ROV/SOB/DM The patient presents for evaluation of recent episodes of SOB.  He is not describing PND or orthpnea.  He has had no chest pain.  During the coarse of this evaluation he was found to be significantly anemic. He is on chronic anticoagulation because of atrial fibrillation. Of note his EKG today demonstrates regular tachycardia with no clear P waves (questionable flutter).  NO distress Lungs:  clear COR:  no murmur ABD:  no rebound no guarding Ext:  mild edema.  A/P  I have examined the patient and interviewed him.  I agree with the findings above per Mr Alben Spittle.  He is to be admitted for further evaluation of his anemia.

## 2011-01-25 NOTE — Progress Notes (Signed)
  Phone Note Call from Patient Call back at [phone]   Caller: Patient Summary of Call: Pt returning call Initial call taken by: Judie Grieve,  January 09, 2011 2:09 PM

## 2011-01-25 NOTE — Progress Notes (Signed)
Summary: sob no chest pain  Phone Note Call from Patient Call back at Home Phone [phone] Call back at [phone]   Caller: Patient Reason for Call: Talk to Nurse Summary of Call: pt is having sob no chest pain. pt would like to talk to a nurse. Initial call taken by: Roe Coombs,  January 03, 2011 2:42 PM  Follow-up for Phone Call        spoke wiht pt, he was traveling today and when he stopped to use the bathroom, he got very SOB and was gasping walking from his truck to the bathroom. he used the bathroom and had no trouble walking back to the truck. this also happened a second time when he stopped to use the bathroom. he denies any chest pain. he has been to several places this afternoon and has had no further problems breathing. he checked his bp when he got home and it was 139/65 with a pulse of 100. he has not felt his heart racing today. the only other symptom he had with the SOB was his head felt very heavy, but no headache. will foward for dr [name] review Deliah Goody, RN  January 03, 2011 3:44 PM   Additional Follow-up for Phone Call Additional follow up Details #1::        [phone]. cindy pt daughter would like to talk to debra.  Additional Follow-up by: Roe Coombs,  January 04, 2011 1:11 PM    Additional Follow-up for Phone Call Additional follow up Details #2::    discussed with dr [name], he wants the pt to see scott weaver tomorrow. he wants the pt to have a cxr, cbc and protime. pt aware to be here at 10am tomorrow. he states he had his protime done today. dr [name] would like the pt to have a f/u echo for his AVR. Deliah Goody, RN  January 04, 2011 5:22 PM

## 2011-01-25 NOTE — Miscellaneous (Signed)
  Clinical Lists Changes  Observations: Added new observation of PAST MED HX: COPD (ICD-496) CAD, ARTERY BYPASS GRAFT (ICD-414.04) AORTIC VALVE REPLACEMENT, HX OF (ICD-V43.3)   a.  echo 12/2010: mild LVH; EF 55% to 65%; aortic valve bioprosthesis ok;  mild to mod MR;          mild RVE; mod TR;PASP 49 COUMADIN THERAPY (ICD-V58.61) ATRIAL FIBRILLATION (ICD-427.31)   (01/08/2011 17:28)       Past History:  Past Medical History: COPD (ICD-496) CAD, ARTERY BYPASS GRAFT (ICD-414.04) AORTIC VALVE REPLACEMENT, HX OF (ICD-V43.3)   a.  echo 12/2010: mild LVH; EF 55% to 65%; aortic valve bioprosthesis ok;  mild to mod MR;          mild RVE; mod TR;PASP 49 COUMADIN THERAPY (ICD-V58.61) ATRIAL FIBRILLATION (ICD-427.31)

## 2011-01-25 NOTE — Progress Notes (Signed)
Summary: standing order to draw hemo / Coumadin level  Phone Note Call from Patient Call back at Home Phone [phone]   Caller: Spouse- jean  Reason for Call: Talk to Nurse Summary of Call: need an standing order to draw the Hemolgbin / Coumadin level. sent to [facility] in [place] . [phone]. Initial call taken by: Lorne Skeens,  January 15, 2011 8:26 AM  Follow-up for Phone Call        Pt's wife would like to have a standing order to have Hemoglobin checked when he has INR done. Wife states pt's HGB was 7.5 and had to go to the hospital. To prevent HGB to get that low, she would like for pt. to have CBC done every  time with INR. Ollen Gross, RN, BSN  January 15, 2011 8:52 AM   Additional Follow-up for Phone Call Additional follow up Details #1::        Ok for standing order for Hb and INR Additional Follow-up by: Colon Branch, MD, [facility],  January 15, 2011 11:11 AM    Additional Follow-up for Phone Call Additional follow up Details #2::    order faxed to [facility] fax number [phone] Follow-up by: Scherrie Bateman, LPN,  January 15, 2011 1:54 PM

## 2011-01-26 ENCOUNTER — Encounter: Payer: Self-pay | Admitting: Internal Medicine

## 2011-01-30 ENCOUNTER — Encounter: Payer: Self-pay | Admitting: Cardiovascular Disease

## 2011-01-31 NOTE — Letter (Signed)
Summary: GIVENS CAPSULE ORDER  GIVENS CAPSULE ORDER   Imported By: Ave Filter 01/26/2011 09:41:12  _____________________________________________________________________  External Attachment:    Type:   Image     Comment:   External Document

## 2011-02-01 ENCOUNTER — Encounter: Payer: Self-pay | Admitting: Gastroenterology

## 2011-02-04 LAB — CONVERTED CEMR LAB
Basophils Absolute: 0 10*3/uL (ref 0.0–0.1)
Eosinophils Relative: 2 % (ref 0–5)
HCT: 33.5 % — ABNORMAL LOW (ref 39.0–52.0)
Hemoglobin: 10 g/dL — ABNORMAL LOW (ref 13.0–17.0)
Lymphocytes Relative: 14 % (ref 12–46)
Lymphs Abs: 1.1 10*3/uL (ref 0.7–4.0)
Neutro Abs: 5.6 10*3/uL (ref 1.7–7.7)
Platelets: 260 10*3/uL (ref 150–400)
RDW: 24.7 % — ABNORMAL HIGH (ref 11.5–15.5)
WBC: 7.4 10*3/uL (ref 4.0–10.5)

## 2011-02-05 ENCOUNTER — Ambulatory Visit (HOSPITAL_COMMUNITY)
Admission: RE | Admit: 2011-02-05 | Discharge: 2011-02-05 | Disposition: A | Discharge status: Home / Self Care | Payer: Medicare Other | Source: Ambulatory Visit | Attending: Internal Medicine | Admitting: Internal Medicine

## 2011-02-05 ENCOUNTER — Encounter: Payer: MEDICARE | Admitting: Internal Medicine

## 2011-02-05 ENCOUNTER — Encounter: Payer: Self-pay | Admitting: Cardiovascular Disease

## 2011-02-05 DIAGNOSIS — Z7901 Long term (current) use of anticoagulants: Secondary | ICD-10-CM | POA: Insufficient documentation

## 2011-02-05 DIAGNOSIS — Z954 Presence of other heart-valve replacement: Secondary | ICD-10-CM | POA: Insufficient documentation

## 2011-02-05 DIAGNOSIS — D509 Iron deficiency anemia, unspecified: Secondary | ICD-10-CM | POA: Insufficient documentation

## 2011-02-08 NOTE — Miscellaneous (Signed)
Summary: Orders Update  Clinical Lists Changes  Orders: Added new Test order of T-CBC w/Diff (85025-10010) - Signed 

## 2011-02-08 NOTE — Medication Information (Signed)
Summary: Orders  Orders   Imported By: Marylou Mccoy 02/01/2011 15:31:30  _____________________________________________________________________  External Attachment:    Type:   Image     Comment:   External Document

## 2011-02-08 NOTE — Assessment & Plan Note (Signed)
Summary: HOSP F/U/IRON DEFICIENCY ANEMIA/LAW   Vital Signs:  Patient profile:   70 year old male Height:      72 inches Weight:      227 pounds BMI:     30.90 Temp:     98.8 degrees F oral Pulse rate:   78 / minute BP sitting:   128 / 68  (left arm) Cuff size:   regular  Vitals Entered By: Hendricks Limes LPN (January 25, 2011 2:21 PM)  Visit Type:  Follow-up Visit Primary Care Provider:  [facility]  CC:  follow up.  History of Present Illness: [name] is a pleasant 70 year old Caucasian male who has a hx of IDA, chronic afib, aortic valve replacement with Maze procedure, on chronic coumadin. Pt was recently in [facility] from 1/13-1/17 with exacerbation of heart failure, noted anemia; received 1 u PRBCs. Had an EGD during admission that was normal. Last colonoscopy in May 2011 secondary to lower GI bleed, thought to be related to diverticular disease. Colonoscopy with diverticulosis and internal hemorrhoids. H/H at d/c was 9.1/29.7.  Pt presents today without any abdominal pain, no N/V, no evidence of bleeding per rectum. no dysphagia. He denies any problems at this time. Recently had an updated H/H end of Jan: 9.3/31.7.  Current Medications (verified): 1)  Vitamin C 1000 Mg Tabs (Ascorbic Acid) .Marland Kitchen.. 1 Tab By Mouth Two Times A Day 2)  B12 .Marland Kitchen.. 1 Tab By Mouth Once Daily 3)  Folic Acid 400 Mcg Tabs (Folic Acid) .Marland Kitchen.. 1 Tab Once Daily 4)  Metoprolol Succinate 50 Mg Xr24h-Tab (Metoprolol Succinate) .Marland Kitchen.. 1 1/2 Tab By Mouth Two Times A Day 5)  Vitamin E 400 Unit Caps (Vitamin E) .Marland Kitchen.. 1 Tab By Mouth Two Times A Day 6)  Warfarin Sodium 5 Mg Tabs (Warfarin Sodium) .... As Directed 7)  Stool Softner .... As Needed 8)  Lasix 20 Mg Tabs (Furosemide) .... Take One Tablet By Mouth Once A Day For 3 Days 9)  Potassium Chloride Cr 10 Meq Cr-Caps (Potassium Chloride) .... Take One Tablet By Mouth Daily For 3 Days. 10)  Ventolin Hfa 108 (90 Base) Mcg/act Aers (Albuterol Sulfate)  .... Take One To 2 Puffs Every 6 Hours As Needed 11)  Alprazolam 0.25 Mg Tabs (Alprazolam) .... Two Times A Day As Needed 12)  Iron 150mg  .... Once Daily  Allergies (verified): 1)  ! Cardizem Cd (Diltiazem Hcl Coated Beads) 2)  ! * Pcn  Past History:  Past Medical History: COPD (ICD-496) CAD, ARTERY BYPASS GRAFT (ICD-414.04) AORTIC VALVE REPLACEMENT, HX OF (ICD-V43.3)   a.  echo 12/2010: mild LVH; EF 55% to 65%; aortic valve bioprosthesis ok;  mild to mod MR;          mild RVE; mod TR;PASP 49 COUMADIN THERAPY (ICD-V58.61) ATRIAL FIBRILLATION (ICD-427.31) May 2011 colonoscopy at [facility]: diverticulosis, internal hemorrhoids EGD Jan 2012: normal  Past Surgical History: Reviewed history from 05/25/2010 and no changes required. The patient received a 27 mm Medtronic freestyle aortic       root heart valve prosthesis in April 2008.   hemorrhoid HEMORRHOIDECTOMY TONSILLECTOMY  Family History: Reviewed history from 04/29/2009 and no changes required. non-contributory  Social History: Reviewed history from 01/05/2011 and no changes required. Full Time Married  celebrated 50tj aniv 2010 Tobacco Use - Yes. recently restarted (over 50 pk years) Alcohol Use - no Drug Use - no Enjoys golf  Review of Systems General:  Denies fever, chills, and anorexia. Eyes:  Denies  blurring, irritation, and discharge. ENT:  Denies sore throat, hoarseness, and difficulty swallowing. CV:  Denies chest pains and syncope. Resp:  Denies dyspnea at rest and wheezing. GI:  Denies difficulty swallowing, pain on swallowing, nausea, indigestion/heartburn, abdominal pain, change in bowel habits, bloody BM's, and black BMs. GU:  Denies urinary burning and urinary frequency. MS:  Denies joint pain / LOM, joint swelling, and joint stiffness. Derm:  Denies rash, itching, and dry skin. Neuro:  Denies weakness and syncope. Psych:  Denies depression and anxiety. Endo:  Denies cold intolerance and heat  intolerance.  Physical Exam  General:  Well developed, well nourished, no acute distress. Head:  Normocephalic and atraumatic. Eyes:  sclera without icterus Lungs:  Clear throughout to auscultation. Heart:  Regular rate and rhythm; no murmurs, rubs,  or bruits. Abdomen:  +BS, soft, non-tender, non-distended. no HSM, no rebound or guarding.  Msk:  Symmetrical with no gross deformities. Normal posture. Pulses:  Normal pulses noted. Extremities:  No clubbing, cyanosis, edema or deformities noted. Neurologic:  Alert and  oriented x4;  grossly normal neurologically. Skin:  Intact without significant lesions or rashes. Psych:  Alert and cooperative. Normal mood and affect.   Impression & Recommendations:  Problem # 1:  ANEMIA-IRON DEFICIENCY (ICD-23.61)  70 year old Caucasian male with hx of ongoing anemia. Last colonoscopy May 2011 secondary to lower GI bleed: thought to be diverticular origin. Most recently EGD during admission in Jan 2012 normal. No evidence of melena or hematochezia. H/H end of January essentially stable since d/c from early January; however, further investigation warranted secondary to continued anemia. Will further evaluate GI tract with GIVENS for any small bowel component.  GIVENS capsule study Further rec's to follow H/H in 4 weeks  Orders: Est. Patient Level II ([address])   Orders Added: 1)  Est. Patient Level II [60[address]]  Appended Document: HOSP F/U/IRON DEFICIENCY ANEMIA/LAW please have CBC redrawn in 4 weeks.  Appended Document: HOSP F/U/IRON DEFICIENCY ANEMIA/LAW lab order on file

## 2011-02-14 NOTE — Consult Note (Signed)
Summary: [facility] Consultation Report  [facility] Consultation Report   Imported By: Earl Many 01/31/2011 16:51:35  _____________________________________________________________________  External Attachment:    Type:   Image     Comment:   External Document

## 2011-02-14 NOTE — Letter (Signed)
Summary: Physicians Orders  Physicians Orders   Imported By: Marylou Mccoy 02/06/2011 15:01:42  _____________________________________________________________________  External Attachment:    Type:   Image     Comment:   External Document

## 2011-02-16 ENCOUNTER — Ambulatory Visit (INDEPENDENT_AMBULATORY_CARE_PROVIDER_SITE_OTHER): Payer: Medicare Other | Admitting: Cardiovascular Disease

## 2011-02-16 ENCOUNTER — Ambulatory Visit: Payer: Self-pay | Admitting: Cardiovascular Disease

## 2011-02-16 ENCOUNTER — Encounter: Payer: Self-pay | Admitting: Cardiovascular Disease

## 2011-02-16 ENCOUNTER — Telehealth: Payer: Self-pay | Admitting: Cardiovascular Disease

## 2011-02-16 DIAGNOSIS — I4891 Unspecified atrial fibrillation: Secondary | ICD-10-CM

## 2011-02-16 DIAGNOSIS — I359 Nonrheumatic aortic valve disorder, unspecified: Secondary | ICD-10-CM

## 2011-02-16 DIAGNOSIS — D649 Anemia, unspecified: Secondary | ICD-10-CM

## 2011-02-19 DIAGNOSIS — Z954 Presence of other heart-valve replacement: Secondary | ICD-10-CM

## 2011-02-19 DIAGNOSIS — Z7901 Long term (current) use of anticoagulants: Secondary | ICD-10-CM

## 2011-02-19 DIAGNOSIS — D509 Iron deficiency anemia, unspecified: Secondary | ICD-10-CM

## 2011-02-19 DIAGNOSIS — I4891 Unspecified atrial fibrillation: Secondary | ICD-10-CM

## 2011-02-20 NOTE — Assessment & Plan Note (Signed)
Summary: F/U 6 MONTHS/JT unable to confirm appt lmom=mj   Primary Provider:  [facility]   History of Present Illness: [name] is a pleasant 70 year old Caucasian male who has a hx of IDA, chronic afib, aortic valve replacement with Maze procedure, on chronic coumadin. Pt was recently in [facility] from 1/13-1/17 with exacerbation of heart failure, noted anemia; received 1 u PRBCs. Had an EGD during admission that was normal. Last colonoscopy in May 2011 secondary to lower GI bleed, thought to be related to diverticular disease. Colonoscopy with diverticulosis and internal hemorrhoids. H/H at d/c was 9.1/29.7. Last Hct 31.7 Capsule endoscopy negative.  Will check Hb/Hct everyother coumadin clinic appt.    Need to change to metoprolol tartrate to save money  Current Problems (verified): 1)  Anemia-iron Deficiency  (ICD-280.9) 2)  Shortness of Breath  (ICD-786.05) 3)  Diverticular Bleeding, Hx of  (ICD-V12.79) 4)  Renal Calculus, Hx of  (ICD-V13.01) 5)  Hx of Colon Cancer  (ICD-153.9) 6)  Bee Sting Allergy  (ICD-V15.06) 7)  COPD  (ICD-496) 8)  Cad, Artery Bypass Graft  (ICD-414.04) 9)  Aortic Valve Replacement, Hx of  (ICD-V43.3) 10)  Coumadin Therapy  (ICD-V58.61) 11)  Atrial Fibrillation  (ICD-427.31)  Current Medications (verified): 1)  Vitamin C 1000 Mg Tabs (Ascorbic Acid) .Marland Kitchen.. 1 Tab By Mouth Two Times A Day 2)  B12 .Marland Kitchen.. 1 Tab By Mouth Once Daily 3)  Folic Acid 400 Mcg Tabs (Folic Acid) .Marland Kitchen.. 1 Tab Once Daily 4)  Metoprolol Succinate 50 Mg Xr24h-Tab (Metoprolol Succinate) .Marland Kitchen.. 1 1/2 Tab By Mouth Two Times A Day 5)  Vitamin E 400 Unit Caps (Vitamin E) .Marland Kitchen.. 1 Tab By Mouth Two Times A Day 6)  Warfarin Sodium 5 Mg Tabs (Warfarin Sodium) .... As Directed 7)  Stool Softner .... As Needed 8)  Furosemide 40 Mg Tabs (Furosemide) .... Take One Tablet By Mouth Daily. 9)  Potassium Chloride Cr 10 Meq Cr-Caps (Potassium Chloride) .... Take One Tablet By Mouth Daily For 3  Days. 10)  Ventolin Hfa 108 (90 Base) Mcg/act Aers (Albuterol Sulfate) .... Take One To 2 Puffs Every 6 Hours As Needed 11)  Iron 150mg  .... Once Daily 12)  Pantoprazole Sodium 40 Mg Tbec (Pantoprazole Sodium) .Marland Kitchen.. 1 Tab By Mouth Once Daily  Allergies (verified): 1)  ! Cardizem Cd (Diltiazem Hcl Coated Beads) 2)  ! * Pcn  Past History:  Past Medical History: Last updated: 01/25/2011 COPD (ICD-496) CAD, ARTERY BYPASS GRAFT (ICD-414.04) AORTIC VALVE REPLACEMENT, HX OF (ICD-V43.3)   a.  echo 12/2010: mild LVH; EF 55% to 65%; aortic valve bioprosthesis ok;  mild to mod MR;          mild RVE; mod TR;PASP 49 COUMADIN THERAPY (ICD-V58.61) ATRIAL FIBRILLATION (ICD-427.31) May 2011 colonoscopy at [facility]: diverticulosis, internal hemorrhoids EGD Jan 2012: normal  Past Surgical History: Last updated: 05/25/2010 The patient received a 27 mm Medtronic freestyle aortic       root heart valve prosthesis in April 2008.   hemorrhoid HEMORRHOIDECTOMY TONSILLECTOMY  Family History: Last updated: 04/29/2009 non-contributory  Social History: Last updated: 01/05/2011 Full Time Married  celebrated 50tj aniv 2010 Tobacco Use - Yes. recently restarted (over 50 pk years) Alcohol Use - no Drug Use - no Enjoys golf  Review of Systems       Denies fever, malais, weight loss, blurry vision, decreased visual acuity, cough, sputum, SOB, hemoptysis, pleuritic pain, palpitaitons, heartburn, abdominal pain, melena, lower extremity edema, claudication, or rash.  Vital Signs:  Patient profile:   70 year old male Height:      72 inches Weight:      226 pounds BMI:     30.76 Pulse rate:   80 / minute Pulse rhythm:   irregularly irregular Resp:     12 per minute BP sitting:   130 / 72  (left arm)  Vitals Entered By: Kem Parkinson (February 16, 2011 2:30 PM)  Physical Exam  General:  Affect appropriate Healthy:  appears stated age HEENT: normal Neck supple with no adenopathy JVP normal no  bruits no thyromegaly Lungs clear with no wheezing and good diaphragmatic motion Heart:  S1/S2 SEM tissue AVR  murmur,rub, gallop or click PMI normal Abdomen: benighn, BS positve, no tenderness, no AAA no bruit.  No HSM or HJR Distal pulses intact with no bruits No edema Neuro non-focal Skin warm and dry    Impression & Recommendations:  Problem # 1:  ANEMIA-IRON DEFICIENCY (ICD-280.9) F/U gi  continue Feso4  Check HCT q 2 months  Problem # 2:  CAD, ARTERY BYPASS GRAFT (ICD-414.04) Stable no angina Continue BB His updated medication list for this problem includes:    Metoprolol Tartrate 50 Mg Tabs (Metoprolol tartrate) .Marland Kitchen... Take one tablet by mouth twice a day    Warfarin Sodium 5 Mg Tabs (Warfarin sodium) .Marland Kitchen... As directed  Problem # 3:  AORTIC VALVE REPLACEMENT, HX OF (ICD-V43.3) Normal exam with no evidence of SBE  Problem # 4:  CAD, ARTERY BYPASS GRAFT (ICD-414.04) Stable no angina His updated medication list for this problem includes:    Metoprolol Tartrate 50 Mg Tabs (Metoprolol tartrate) .Marland Kitchen... Take one tablet by mouth twice a day    Warfarin Sodium 5 Mg Tabs (Warfarin sodium) .Marland Kitchen... As directed  Problem # 5:  COUMADIN THERAPY (ICD-V58.61) F/U clinic  [facility] 2.0-2.5   Patient Instructions: 1)  Your physician wants you to follow-up in: 6 MONTHS  You will receive a reminder letter in the mail two months in advance. If you don't receive a letter, please call our office to schedule the follow-up appointment. Prescriptions: METOPROLOL TARTRATE 50 MG TABS (METOPROLOL TARTRATE) Take one tablet by mouth twice a day  #180 x 4   Entered by:   Deliah Goody, RN   Authorized by:   Colon Branch, MD, [facility]   Signed by:   Deliah Goody, RN on 02/16/2011   Method used:   Electronically to        [facility] (mail-order)       [address]       [place], [place]  [address]       Ph: [phone]       Fax: [phone]   RxID:   [id number]

## 2011-02-22 ENCOUNTER — Encounter (INDEPENDENT_AMBULATORY_CARE_PROVIDER_SITE_OTHER): Payer: Self-pay | Admitting: *Deleted

## 2011-02-22 ENCOUNTER — Telehealth: Payer: Self-pay | Admitting: Cardiovascular Disease

## 2011-03-01 NOTE — Letter (Addendum)
Summary: Recall, Labs Needed  [facility]  [address]   [place], [place] [address]   Phone: [phone]  Fax: [phone]    February 22, 2011  Kyle Love [address] [place], [place]  [address]  [place] 01-03-1941   Dear Mr. Shampine,   Our records indicate it is time to repeat your blood work.  You can take the enclosed form to the lab on or near the date indicated.  Please make note of the new location of the lab:   [address], 2nd floor   [employer]  Our office will call you within a week to ten business days with the results.  If you do not hear from [place] in 10 business days, you should call the office.  If you have any questions regarding this, call the office at [phone], and ask for the nurse.  Labs are due on 26 February 2011  Sincerely,    Carolan Clines LPN  [facility] Ph: [phone]   Fax: [phone]   Appended Document: Recall, Labs Needed Patients wife called and stated that Dr. [name] is already doing labs on him everytime they check his coumadin, Dr. Ricki Miller office will fax [place] a copy of labs

## 2011-03-01 NOTE — Progress Notes (Signed)
Summary: pt needs refill  Phone Note Refill Request Call back at Home Phone [phone] Message from:  Patient  Refills Requested: Medication #1:  FUROSEMIDE 40 MG TABS Take one tablet by mouth daily.  Medication #2:  PANTOPRAZOLE SODIUM 40 MG TBEC 1 tab by mouth once daily.  Medication #3:  POTASSIUM CHLORIDE CR 10 MEQ CR-CAPS Take one tablet by mouth daily for 3 days. pt needs refills [facility] for [facility]day supply  Initial call taken by: Omer Jack,  February 16, 2011 5:00 PM    Prescriptions: PANTOPRAZOLE SODIUM 40 MG TBEC (PANTOPRAZOLE SODIUM) 1 tab by mouth once daily  #90 x 3   Entered by:   Kem Parkinson   Authorized by:   Colon Branch, MD, [facility]   Signed by:   Kem Parkinson on 02/19/2011   Method used:   Faxed to ...       [facility]retail)             , [place]         Ph:        Fax: [phone]   RxID:   [id number] POTASSIUM CHLORIDE CR 10 MEQ CR-CAPS (POTASSIUM CHLORIDE) Take one tablet by mouth daily for 3 days.  #90 x 3   Entered by:   Kem Parkinson   Authorized by:   Colon Branch, MD, [facility]   Signed by:   Kem Parkinson on 02/19/2011   Method used:   Faxed to ...       [facility]retail)             , [place]         Ph:        Fax: [phone]   RxID:   [id number] FUROSEMIDE 40 MG TABS (FUROSEMIDE) Take one tablet by mouth daily.  #90 x 3   Entered by:   Kem Parkinson   Authorized by:   Colon Branch, MD, [facility]   Signed by:   Kem Parkinson on 02/19/2011   Method used:   Faxed to ...       [facility]ail)             , [place]         Ph:        Fax: [phone]   RxID:   [id number] PANTOPRAZOLE SODIUM 40 MG TBEC (PANTOPRAZOLE SODIUM) 1 tab by mouth once daily  #90 x 3   Entered by:   Kem Parkinson   Authorized by:   Colon Branch, MD, [facility]   Signed by:   Kem Parkinson on 02/19/2011   Method used:   Electronically to        [facility] [phone]* (retail)       [address]       [place], [place]  [address]       Ph: [phone]       Fax: [phone]   RxID:   [id number] POTASSIUM CHLORIDE CR 10 MEQ CR-CAPS (POTASSIUM CHLORIDE) Take one tablet by mouth daily for 3 days.  #90 x 3   Entered by:   Kem Parkinson   Authorized by:   Colon Branch, MD, [facility]   Signed by:   Kem Parkinson on 02/19/2011   Method used:   Electronically to        [facility] [phone]* (retail)       [address]       [place], [place]  [address]       Ph: [phone]       Fax: [phone]   RxID:   [id number] FUROSEMIDE 40 MG TABS (FUROSEMIDE) Take one tablet by mouth daily.  #90 x 3   Entered by:   Kem Parkinson   Authorized by:   Colon Branch, MD, [facility]   Signed by:   Kem Parkinson on 02/19/2011   Method used:   [facility]ronically to        [facility] [phone]* (retail)       [address]       [place], [place]  [address]       Ph: [phone]       Fax: [phone]   RxID:   [id number]

## 2011-03-05 ENCOUNTER — Encounter: Payer: Self-pay | Admitting: Internal Medicine

## 2011-03-06 ENCOUNTER — Telehealth: Payer: Self-pay | Admitting: Cardiovascular Disease

## 2011-03-06 NOTE — Procedures (Signed)
Summary: Capsule Endoscopy Assessment  Capsule Endoscopy Assessment   Imported By: Kassie Mends 02/28/2011 11:40:37  _____________________________________________________________________  External Attachment:    Type:   Image     Comment:   External Document

## 2011-03-06 NOTE — Progress Notes (Signed)
Summary: refill meds  Phone Note Refill Request   Refills Requested: Medication #1:  WARFARIN SODIUM 5 MG TABS as directed   Supply Requested: 3 months  Method Requested: Fax to [facility] Initial call taken by: Lorne Skeens,  February 22, 2011 10:14 AM Caller: [facility] [phone]- ref # [id number]    Prescriptions: WARFARIN SODIUM 5 MG TABS (WARFARIN SODIUM) as directed  #60 Tablet x 2   Entered by:   Windell Hummingbird   Authorized by:   Colon Branch, MD, [facility]   Signed by:   Windell Hummingbird on 02/26/2011   Method used:   Electronically to        [facility] (mail-order)       [address], [place]  [address]       Ph: [phone]       Fax: [phone]   RxID:   [id number]

## 2011-03-13 LAB — CBC
HCT: 28.4 % — ABNORMAL LOW (ref 39.0–52.0)
HCT: 34.1 % — ABNORMAL LOW (ref 39.0–52.0)
HCT: 23.9 % — ABNORMAL LOW (ref 39.0–52.0)
Hemoglobin: 10.1 g/dL — ABNORMAL LOW (ref 13.0–17.0)
Hemoglobin: 10.1 g/dL — ABNORMAL LOW (ref 13.0–17.0)
Hemoglobin: 12 g/dL — ABNORMAL LOW (ref 13.0–17.0)
Hemoglobin: 8.2 g/dL — ABNORMAL LOW (ref 13.0–17.0)
Hemoglobin: 8.3 g/dL — ABNORMAL LOW (ref 13.0–17.0)
Hemoglobin: 8.4 g/dL — ABNORMAL LOW (ref 13.0–17.0)
MCHC: 34.6 g/dL (ref 30.0–36.0)
MCHC: 35.5 g/dL (ref 30.0–36.0)
MCHC: 34.2 g/dL (ref 30.0–36.0)
MCV: 94.6 fL (ref 78.0–100.0)
MCV: 97.7 fL (ref 78.0–100.0)
MCV: 98 fL (ref 78.0–100.0)
Platelets: 191 10*3/uL (ref 150–400)
Platelets: 232 10*3/uL (ref 150–400)
Platelets: 156 10*3/uL (ref 150–400)
Platelets: 191 10*3/uL (ref 150–400)
RBC: 3.05 MIL/uL — ABNORMAL LOW (ref 4.22–5.81)
RDW: 14.9 % (ref 11.5–15.5)
RDW: 15.1 % (ref 11.5–15.5)
RDW: 14.6 % (ref 11.5–15.5)
RDW: 14.7 % (ref 11.5–15.5)
RDW: 14.9 % (ref 11.5–15.5)
WBC: 5.5 10*3/uL (ref 4.0–10.5)
WBC: 6.9 10*3/uL (ref 4.0–10.5)

## 2011-03-13 LAB — DIFFERENTIAL
Basophils Absolute: 0 10*3/uL (ref 0.0–0.1)
Basophils Absolute: 0 10*3/uL (ref 0.0–0.1)
Basophils Absolute: 0 10*3/uL (ref 0.0–0.1)
Basophils Relative: 0 % (ref 0–1)
Basophils Relative: 1 % (ref 0–1)
Basophils Relative: 1 % (ref 0–1)
Eosinophils Relative: 2 % (ref 0–5)
Lymphocytes Relative: 17 % (ref 12–46)
Lymphocytes Relative: 19 % (ref 12–46)
Lymphs Abs: 0.9 10*3/uL (ref 0.7–4.0)
Monocytes Absolute: 0.4 10*3/uL (ref 0.1–1.0)
Monocytes Absolute: 0.4 10*3/uL (ref 0.1–1.0)
Monocytes Absolute: 0.6 10*3/uL (ref 0.1–1.0)
Neutro Abs: 3.4 10*3/uL (ref 1.7–7.7)
Neutro Abs: 3.4 10*3/uL (ref 1.7–7.7)
Neutro Abs: 5 10*3/uL (ref 1.7–7.7)
Neutrophils Relative %: 65 % (ref 43–77)
Neutrophils Relative %: 72 % (ref 43–77)

## 2011-03-13 LAB — BASIC METABOLIC PANEL
BUN: 27 mg/dL — ABNORMAL HIGH (ref 6–23)
Calcium: 8.1 mg/dL — ABNORMAL LOW (ref 8.4–10.5)
Creatinine, Ser: 0.98 mg/dL (ref 0.4–1.5)
GFR calc non Af Amer: >60 mL/min (ref >60)
GFR calc non Af Amer: >60 mL/min (ref >60)
Glucose, Bld: 101 mg/dL — ABNORMAL HIGH (ref 70–99)
Potassium: 3.9 mEq/L (ref 3.5–5.1)
Sodium: 140 mEq/L (ref 135–145)
Sodium: 140 mEq/L (ref 135–145)

## 2011-03-13 LAB — COMPREHENSIVE METABOLIC PANEL
Albumin: 3.7 g/dL (ref 3.5–5.2)
Alkaline Phosphatase: 68 U/L (ref 39–117)
BUN: 18 mg/dL (ref 6–23)
CO2: 28 mEq/L (ref 19–32)
Chloride: 104 mEq/L (ref 96–112)
Creatinine, Ser: 1.03 mg/dL (ref 0.4–1.5)
GFR calc non Af Amer: >60 mL/min (ref >60)
Glucose, Bld: 88 mg/dL (ref 70–99)
Potassium: 4.1 mEq/L (ref 3.5–5.1)
Total Bilirubin: 0.7 mg/dL (ref 0.3–1.2)

## 2011-03-13 LAB — PREPARE RBC (CROSSMATCH)

## 2011-03-13 LAB — PROTIME-INR
INR: 1.93 — ABNORMAL HIGH (ref 0.00–1.49)
Prothrombin Time: 26.6 seconds — ABNORMAL HIGH (ref 11.6–15.2)
Prothrombin Time: 27.5 seconds — ABNORMAL HIGH (ref 11.6–15.2)
Prothrombin Time: 21.9 seconds — ABNORMAL HIGH (ref 11.6–15.2)

## 2011-03-13 LAB — TYPE AND SCREEN: Antibody Screen: NEGATIVE

## 2011-03-13 NOTE — Progress Notes (Signed)
Summary: need to verify medication  Phone Note From Pharmacy   Caller: [facility] [phone] Summary of Call: need to verify medication Initial call taken by: Judie Grieve,  March 06, 2011 3:34 PM  Follow-up for Phone Call        Pharmacist called:  [facility] called and question answered re: Potassium order-given 90 caps with 3 refills.   Follow-up by: Dessie Coma  LPN,  March 06, 2011 3:44 PM

## 2011-03-21 ENCOUNTER — Telehealth: Payer: Self-pay | Admitting: Cardiovascular Disease

## 2011-03-21 NOTE — Telephone Encounter (Signed)
Pt wife has question re pt coumadin

## 2011-03-21 NOTE — Telephone Encounter (Signed)
Spoke with pt wife, she reports the pt is hurting in his side and he has felt bad for the last three to four days and they feel like he maybe bleeding in his stomach again. Discussed with dr [name], pt to go to [facility] for eval. Pt wife voiced understanding.

## 2011-04-02 ENCOUNTER — Other Ambulatory Visit: Payer: Self-pay | Admitting: Cardiovascular Disease

## 2011-04-05 ENCOUNTER — Other Ambulatory Visit: Payer: Self-pay | Admitting: Cardiovascular Disease

## 2011-04-05 LAB — CBC
HCT: 27.2 % — ABNORMAL LOW (ref 39.0–52.0)
HCT: 27.7 % — ABNORMAL LOW (ref 39.0–52.0)
Hemoglobin: 14.2 g/dL (ref 13.0–17.0)
Hemoglobin: 9.4 g/dL — ABNORMAL LOW (ref 13.0–17.0)
MCHC: 33.7 g/dL (ref 30.0–36.0)
MCHC: 33.9 g/dL (ref 30.0–36.0)
MCHC: 34.4 g/dL (ref 30.0–36.0)
MCV: 96 fL (ref 78.0–100.0)
MCV: 97.7 fL (ref 78.0–100.0)
MCV: 98.1 fL (ref 78.0–100.0)
Platelets: 169 10*3/uL (ref 150–400)
Platelets: 172 10*3/uL (ref 150–400)
Platelets: 176 10*3/uL (ref 150–400)
RBC: 3.53 MIL/uL — ABNORMAL LOW (ref 4.22–5.81)
RDW: 15.2 % (ref 11.5–15.5)
RDW: 15.6 % — ABNORMAL HIGH (ref 11.5–15.5)
RDW: 16.4 % — ABNORMAL HIGH (ref 11.5–15.5)
WBC: 12.4 10*3/uL — ABNORMAL HIGH (ref 4.0–10.5)
WBC: 17.8 10*3/uL — ABNORMAL HIGH (ref 4.0–10.5)

## 2011-04-05 LAB — DIFFERENTIAL
Basophils Absolute: 0 10*3/uL (ref 0.0–0.1)
Basophils Absolute: 0 10*3/uL (ref 0.0–0.1)
Basophils Relative: 0 % (ref 0–1)
Basophils Relative: 0 % (ref 0–1)
Eosinophils Absolute: 0 10*3/uL (ref 0.0–0.7)
Eosinophils Absolute: 0 10*3/uL (ref 0.0–0.7)
Eosinophils Relative: 0 % (ref 0–5)
Eosinophils Relative: 0 % (ref 0–5)
Eosinophils Relative: 0 % (ref 0–5)
Lymphocytes Relative: 5 % — ABNORMAL LOW (ref 12–46)
Lymphocytes Relative: 8 % — ABNORMAL LOW (ref 12–46)
Lymphs Abs: 0.9 10*3/uL (ref 0.7–4.0)
Lymphs Abs: 1 10*3/uL (ref 0.7–4.0)
Monocytes Relative: 4 % (ref 3–12)
Monocytes Relative: 6 % (ref 3–12)
Neutro Abs: 10.7 10*3/uL — ABNORMAL HIGH (ref 1.7–7.7)
Neutro Abs: 16.2 10*3/uL — ABNORMAL HIGH (ref 1.7–7.7)
Neutro Abs: 9.4 10*3/uL — ABNORMAL HIGH (ref 1.7–7.7)
Neutrophils Relative %: 88 % — ABNORMAL HIGH (ref 43–77)
Neutrophils Relative %: 91 % — ABNORMAL HIGH (ref 43–77)
Neutrophils Relative %: 92 % — ABNORMAL HIGH (ref 43–77)

## 2011-04-05 LAB — BASIC METABOLIC PANEL
BUN: 14 mg/dL (ref 6–23)
BUN: 20 mg/dL (ref 6–23)
BUN: 27 mg/dL — ABNORMAL HIGH (ref 6–23)
CO2: 27 mEq/L (ref 19–32)
CO2: 32 mEq/L (ref 19–32)
CO2: 33 mEq/L — ABNORMAL HIGH (ref 19–32)
Calcium: 8.4 mg/dL (ref 8.4–10.5)
Calcium: 8.5 mg/dL (ref 8.4–10.5)
Calcium: 8.7 mg/dL (ref 8.4–10.5)
Chloride: 101 mEq/L (ref 96–112)
Chloride: 102 mEq/L (ref 96–112)
Chloride: 96 mEq/L (ref 96–112)
Creatinine, Ser: 1.12 mg/dL (ref 0.4–1.5)
Creatinine, Ser: 1.18 mg/dL (ref 0.4–1.5)
Creatinine, Ser: 1.62 mg/dL — ABNORMAL HIGH (ref 0.4–1.5)
GFR calc Af Amer: >60 mL/min (ref >60)
GFR calc non Af Amer: 59 mL/min — ABNORMAL LOW (ref >60)
GFR calc non Af Amer: >60 mL/min (ref >60)
Glucose, Bld: 117 mg/dL — ABNORMAL HIGH (ref 70–99)
Glucose, Bld: 118 mg/dL — ABNORMAL HIGH (ref 70–99)
Glucose, Bld: 138 mg/dL — ABNORMAL HIGH (ref 70–99)
Glucose, Bld: 147 mg/dL — ABNORMAL HIGH (ref 70–99)
Potassium: 4.1 mEq/L (ref 3.5–5.1)
Potassium: 4.3 mEq/L (ref 3.5–5.1)
Potassium: 4.5 mEq/L (ref 3.5–5.1)
Sodium: 138 mEq/L (ref 135–145)
Sodium: 138 mEq/L (ref 135–145)

## 2011-04-05 LAB — HEMOGLOBIN AND HEMATOCRIT, BLOOD
HCT: 27 % — ABNORMAL LOW (ref 39.0–52.0)
HCT: 28.8 % — ABNORMAL LOW (ref 39.0–52.0)
HCT: 38.7 % — ABNORMAL LOW (ref 39.0–52.0)
Hemoglobin: 10.8 g/dL — ABNORMAL LOW (ref 13.0–17.0)
Hemoglobin: 9.6 g/dL — ABNORMAL LOW (ref 13.0–17.0)
Hemoglobin: 9.8 g/dL — ABNORMAL LOW (ref 13.0–17.0)

## 2011-04-05 LAB — CROSSMATCH: Antibody Screen: NEGATIVE

## 2011-04-05 LAB — PREPARE RBC (CROSSMATCH)

## 2011-04-05 LAB — PREPARE FRESH FROZEN PLASMA

## 2011-04-05 LAB — PROTIME-INR
INR: 1.8 — ABNORMAL HIGH (ref 0.00–1.49)
INR: 3.3 — ABNORMAL HIGH (ref 0.00–1.49)
INR: 3.6 — ABNORMAL HIGH (ref 0.00–1.49)
Prothrombin Time: 18.9 seconds — ABNORMAL HIGH (ref 11.6–15.2)
Prothrombin Time: 22.2 seconds — ABNORMAL HIGH (ref 11.6–15.2)
Prothrombin Time: 39.2 seconds — ABNORMAL HIGH (ref 11.6–15.2)

## 2011-04-06 LAB — CBC WITH DIFFERENTIAL/PLATELET
Basophils Absolute: 0 10*3/uL (ref 0.0–0.1)
Eosinophils Relative: 3 % (ref 0–5)
HCT: 35.4 % — ABNORMAL LOW (ref 39.0–52.0)
Hemoglobin: 11.2 g/dL — ABNORMAL LOW (ref 13.0–17.0)
Lymphocytes Relative: 19 % (ref 12–46)
Lymphs Abs: 1.2 10*3/uL (ref 0.7–4.0)
MCV: 85.9 fL (ref 78.0–100.0)
Monocytes Absolute: 0.5 10*3/uL (ref 0.1–1.0)
Monocytes Relative: 9 % (ref 3–12)
Neutro Abs: 4.1 10*3/uL (ref 1.7–7.7)
RDW: 20.7 % — ABNORMAL HIGH (ref 11.5–15.5)
WBC: 6 10*3/uL (ref 4.0–10.5)

## 2011-04-06 LAB — PROTIME-INR: INR: 2.72 — ABNORMAL HIGH (ref <1.50)

## 2011-04-10 ENCOUNTER — Telehealth: Payer: Self-pay | Admitting: Cardiovascular Disease

## 2011-04-10 NOTE — Telephone Encounter (Signed)
Spoke with pt, he is aware of lab results Kyle Love

## 2011-04-10 NOTE — Op Note (Signed)
  NAME:  [name], Kyle Love             ACCOUNT NO.:  [id number]  MEDICAL RECORD NO.:  [id number]           PATIENT TYPE:  O  LOCATION:  DAYP                          FACILITY:  [facility]  PHYSICIAN:  R. Roetta Sessions, M.D. DATE OF BIRTH:  02-May-1941  DATE OF PROCEDURE: DATE OF DISCHARGE:  02/05/2011                              OPERATIVE REPORT   PROCEDURE:  Small bowel, given capsule study.  INDICATIONS FOR PROCEDURE:  Mr. Couzens is a pleasant 70 year old Caucasian male with a history of iron-deficiency anemia, chronic AFib, aortic valve replacement with MAZE procedure and he was on chronic Coumadin.  He was actually at [facility] from January 05, 2011 through January 09, 2011 with an exacerbation of heart failure and noted anemia. He had received 1 unit of packed red blood cells.  He had upper endoscopy during that admission that was reported to be normal.  His last colonoscopy in May 2011 was secondary to a lower GI bleed, which was thought to be diverticular in origin with colonoscopy showed diverticulosis and internal hemorrhoids.  His H and H at discharge from the hospital was 9.1 and 29.7.  He presented to our office in early February of this year without any problems; however, his hemoglobin still remained lower at 9.3 and 31.7.  He is therefore undergoing small bowel capsule study to evaluate for any small bowel etiology.  FINDINGS:  First gastric image was noted at 34 seconds.  First duodenal image was at 1 hour 14 minutes and 54 seconds.  Ileocecal valve image was at 4 hours 13 minutes and 36 seconds.  The first cecal image was at 4 hours 13 minutes and 55 seconds.  There was no evidence of any mass, stricture or ulceration.  No AVMs noted.  There was incidental finding of retained food contents at around 1 hour and 14 minutes and 24 seconds.  RECOMMENDATIONS:  This is essentially normal small bowel given capsule study without any evidence of mass, stricture, or ulceration.   No evidence of GI bleeding.  This case will be discussed and reviewed with Dr. Jena Gauss.  Mr. Lantry is to continue to follow up with our office and to continue to take iron as previously prescribed.  We will keep a close eye on Mr. Nohr.  His last H and H on January 30, 2011 was 10 and 33.5 which is significantly improved from discharged in January.    ______________________________ Gerrit Halls, ANP-BC   ______________________________ R. Roetta Sessions, M.D.   AS/MEDQ  D:  02/19/2011  T:  02/19/2011  Job:  [id number]  Electronically Signed by Gerrit Halls  on 03/20/2011 11:30:46 AM Electronically Signed by Lorrin Goodell M.D. on 04/10/2011 07:45:15 AM

## 2011-04-10 NOTE — Telephone Encounter (Signed)
Pt calling to get test results

## 2011-04-29 ENCOUNTER — Emergency Department (HOSPITAL_COMMUNITY): Payer: Medicare Other

## 2011-04-29 ENCOUNTER — Emergency Department (HOSPITAL_COMMUNITY)
Admission: EM | Admit: 2011-04-29 | Discharge: 2011-04-29 | Disposition: A | Discharge status: Home / Self Care | Payer: Medicare Other | Attending: Emergency Medicine | Admitting: Emergency Medicine

## 2011-04-29 DIAGNOSIS — I4891 Unspecified atrial fibrillation: Secondary | ICD-10-CM | POA: Insufficient documentation

## 2011-04-29 DIAGNOSIS — K5289 Other specified noninfective gastroenteritis and colitis: Secondary | ICD-10-CM | POA: Insufficient documentation

## 2011-04-29 DIAGNOSIS — I2510 Atherosclerotic heart disease of native coronary artery without angina pectoris: Secondary | ICD-10-CM | POA: Insufficient documentation

## 2011-04-29 DIAGNOSIS — Z954 Presence of other heart-valve replacement: Secondary | ICD-10-CM | POA: Insufficient documentation

## 2011-04-29 DIAGNOSIS — Z7901 Long term (current) use of anticoagulants: Secondary | ICD-10-CM | POA: Insufficient documentation

## 2011-04-29 DIAGNOSIS — Z951 Presence of aortocoronary bypass graft: Secondary | ICD-10-CM | POA: Insufficient documentation

## 2011-04-29 DIAGNOSIS — Z79899 Other long term (current) drug therapy: Secondary | ICD-10-CM | POA: Insufficient documentation

## 2011-04-29 DIAGNOSIS — R109 Unspecified abdominal pain: Secondary | ICD-10-CM | POA: Insufficient documentation

## 2011-04-29 LAB — CBC
Hemoglobin: 11.1 g/dL — ABNORMAL LOW (ref 13.0–17.0)
MCH: 28.4 pg (ref 26.0–34.0)
MCHC: 31.9 g/dL (ref 30.0–36.0)
Platelets: 253 10*3/uL (ref 150–400)
RBC: 3.91 MIL/uL — ABNORMAL LOW (ref 4.22–5.81)

## 2011-04-29 LAB — URINALYSIS, ROUTINE W REFLEX MICROSCOPIC
Glucose, UA: NEGATIVE mg/dL
Nitrite: NEGATIVE
Specific Gravity, Urine: <1.005 — ABNORMAL LOW (ref 1.005–1.030)
pH: 6 (ref 5.0–8.0)

## 2011-04-29 LAB — DIFFERENTIAL
Basophils Absolute: 0 10*3/uL (ref 0.0–0.1)
Basophils Relative: 0 % (ref 0–1)
Eosinophils Absolute: 0.2 10*3/uL (ref 0.0–0.7)
Monocytes Relative: 8 % (ref 3–12)
Neutro Abs: 4.4 10*3/uL (ref 1.7–7.7)
Neutrophils Relative %: 73 % (ref 43–77)

## 2011-04-29 LAB — COMPREHENSIVE METABOLIC PANEL
ALT: 11 U/L (ref 0–53)
AST: 19 U/L (ref 0–37)
Albumin: 3.4 g/dL — ABNORMAL LOW (ref 3.5–5.2)
CO2: 29 mEq/L (ref 19–32)
Calcium: 9.3 mg/dL (ref 8.4–10.5)
Chloride: 101 mEq/L (ref 96–112)
Creatinine, Ser: 0.94 mg/dL (ref 0.4–1.5)
GFR calc Af Amer: >60 mL/min (ref >60)
Sodium: 137 mEq/L (ref 135–145)

## 2011-04-29 LAB — PROTIME-INR: Prothrombin Time: 26.9 seconds — ABNORMAL HIGH (ref 11.6–15.2)

## 2011-04-30 ENCOUNTER — Telehealth: Payer: Self-pay

## 2011-04-30 ENCOUNTER — Telehealth: Payer: Self-pay | Admitting: Cardiovascular Disease

## 2011-04-30 NOTE — Telephone Encounter (Signed)
Pt is aware of OV for 05/04/11 @ 11am with KJ

## 2011-04-30 NOTE — Telephone Encounter (Signed)
Spoke with pt, he had to go to the ER yesterday for abdominal cramping. The CT scan showed intestinal inflammation. He was given cefuroxine to take twice daily for three days. He is concerned about his coumadin dosage with the antibiotics and his hx of gi bleed. Pt instructed to take 1/2 his usual coumadin dose until antibiotics are gone. He will have a protime checked the day after he takes the last dose of coumadin.pt voiced understanding Deliah Goody

## 2011-04-30 NOTE — Telephone Encounter (Signed)
Pt wife states pt went to er last night. Pt wife has question re pt meds.

## 2011-04-30 NOTE — Telephone Encounter (Signed)
Pt's wife called and said he was seen in ER over the week-end for inflamed intestines. Was given antibiotics and told to call the office here for follow-up. Please schedule appt.

## 2011-05-03 ENCOUNTER — Other Ambulatory Visit: Payer: Self-pay | Admitting: Cardiovascular Disease

## 2011-05-04 ENCOUNTER — Encounter: Payer: Self-pay | Admitting: Cardiovascular Disease

## 2011-05-04 ENCOUNTER — Ambulatory Visit (INDEPENDENT_AMBULATORY_CARE_PROVIDER_SITE_OTHER): Payer: Medicare Other | Admitting: Urgent Care

## 2011-05-04 ENCOUNTER — Encounter: Payer: Self-pay | Admitting: Urgent Care

## 2011-05-04 VITALS — BP 117/66 | HR 93 | Temp 98.0°F | Ht 72.0 in | Wt 226.6 lb

## 2011-05-04 DIAGNOSIS — K5900 Constipation, unspecified: Secondary | ICD-10-CM | POA: Insufficient documentation

## 2011-05-04 DIAGNOSIS — K5289 Other specified noninfective gastroenteritis and colitis: Secondary | ICD-10-CM

## 2011-05-04 DIAGNOSIS — K529 Noninfective gastroenteritis and colitis, unspecified: Secondary | ICD-10-CM | POA: Insufficient documentation

## 2011-05-04 DIAGNOSIS — Z954 Presence of other heart-valve replacement: Secondary | ICD-10-CM

## 2011-05-04 DIAGNOSIS — D509 Iron deficiency anemia, unspecified: Secondary | ICD-10-CM

## 2011-05-04 LAB — CBC WITH DIFFERENTIAL/PLATELET
Basophils Absolute: 0 10*3/uL (ref 0.0–0.1)
Basophils Relative: 0 % (ref 0–1)
Eosinophils Absolute: 0.2 10*3/uL (ref 0.0–0.7)
Eosinophils Relative: 3 % (ref 0–5)
HCT: 32.1 % — ABNORMAL LOW (ref 39.0–52.0)
Hemoglobin: 10.3 g/dL — ABNORMAL LOW (ref 13.0–17.0)
MCH: 28 pg (ref 26.0–34.0)
MCHC: 32.1 g/dL (ref 30.0–36.0)
Monocytes Absolute: 0.5 10*3/uL (ref 0.1–1.0)
Monocytes Relative: 7 % (ref 3–12)
RDW: 16.2 % — ABNORMAL HIGH (ref 11.5–15.5)

## 2011-05-04 NOTE — Patient Instructions (Addendum)
Colace stool softener twice a day Miralax 17 grams in 8 oz liquid twice per day until BM, then once daily Continue metamucil daily

## 2011-05-04 NOTE — Progress Notes (Addendum)
Primary Care Physician:  Colette Ribas, MD Primary Gastroenterologist:  Dr. Jena Gauss  Chief Complaint  Patient presents with  . Follow-up    abd pain    HPI:  Kyle Love is a 70 y.o. male here for evaluation for abd pain.  Was seen at [facility] hospital on 5/6, dx w/ colitis & given 6 doses ceftin.  Was playing golf, felt like gas bilat lower abd.  CT showed ascending colon colitis.  Pain constant 4/10 now, 8/10 prior to ER.  C/o constipation.  No BM 4 days.  C/o anorexia.  Denies fever/chills.  Small amt of bright red blood in stool (no clots) 2 days after ER visit.   Called cardiologist & he cut coumadin in half while on antibiotics until today.   Past Medical History  Diagnosis Date  . History of ETOH abuse     quit 25 yrs ago, heavy etoh 5 yrs  . Colitis   . History of GI diverticular bleed 3/11  . Rectus sheath hematoma 3/10  . Atrial fibrillation   . Status post Maze operation for atrial fibrillation   . Aortic aneurysm   . 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 S/P colonoscopy 05/03/10    mod diverticulosis sigmoid colon, internal hemorrhoids, suspected diverticular bleed  . Diverticulosis 07/23/2008    colonoscopy by Dr Jena Gauss, hyperplastic polyp  . IDA (iron deficiency anemia)     work-up including small bowel capsule study benign  . CHF (congestive heart failure)     diastolic  . COPD (chronic obstructive pulmonary disease)     Past Surgical History  Procedure Date  . Maze   . Aortic valve replacement 2008  . Hemorrhoid surgery   . Esophagogastroduodenoscopy 1/12    Dr Elnoria Howard reportedly normal    Current Outpatient Prescriptions  Medication Sig Dispense Refill  . ALPRAZolam (XANAX) 0.25 MG tablet Take 0.25 mg by mouth at bedtime as needed.        . metoprolol (TOPROL-XL) 50 MG 24 hr tablet Take 50 mg by mouth daily.        Marland Kitchen warfarin (COUMADIN) 5 MG tablet TAKE 1 TABLET BY MOUTH AS  DIRECTED BY COUMADIN CLINIC  60 tablet  2  . Ascorbic Acid (VITAMIN C) 1000 MG tablet Take 1,000 mg by mouth daily.        . cefUROXime (CEFTIN) 500 MG tablet       . Cyanocobalamin (VITAMIN B 12 PO) Take by mouth.        . docusate sodium (COLACE) 100 MG capsule Take 100 mg by mouth 2 (two) times daily.        . furosemide (LASIX) 40 MG tablet Take 40 mg by mouth 2 (two) times daily.        . iron polysaccharides (NIFEREX) 150 MG capsule Take 150 mg by mouth 2 (two) times daily.        . potassium chloride (KLOR-CON) 10 MEQ CR tablet Take 10 mEq by mouth daily.        . vitamin E 400 UNIT capsule Take 400 Units by mouth daily.          Allergies as of 05/04/2011 - Review Complete 05/04/2011  Allergen Reaction Noted  . Diltiazem hcl  07/11/2009  . Penicillins      Family History: There is no known family history of colorectal carcinoma , liver disease, or inflammatory bowel disease.  Problem Relation Age of Onset  . Lung cancer Mother   . Coronary artery disease Father   . Lung cancer Sister   . Pancreatic cancer Brother     History   Social History  . Marital Status: Married    Spouse Name: N/A    Number of Children: N/A  . Years of Education: N/A   Occupational History  . Not on file.   Social History Main Topics  . Smoking status: Former Smoker -- 1.0 packs/day for 56 years    Types: Cigarettes    Quit date: 01/01/2011  . Smokeless tobacco: Not on file  . Alcohol Use: No     history etoh abuse, quit 25 yrs ago  . Drug Use: No  . Sexually Active: Not on file   Review of Systems: Gen: Denies any fever, chills, sweats, anorexia, fatigue, weakness, malaise, weight loss, and sleep disorder CV: Denies chest pain, angina, palpitations, syncope, orthopnea, PND, peripheral edema, and claudication. Resp: Denies dyspnea at rest, dyspnea with exercise, cough, sputum, wheezing, coughing up blood, and pleurisy. GI: Denies vomiting blood, jaundice, and fecal incontinence.    Denies dysphagia or odynophagia. Derm: Denies rash, itching, dry skin, hives, moles, warts, or unhealing ulcers.  Psych: Denies depression, anxiety, memory loss, suicidal ideation, hallucinations, paranoia, and confusion. Heme: Denies bruising, bleeding, and enlarged lymph nodes.  Physical Exam: BP 117/66  Pulse 93  Temp(Src) 98 F (36.7 C) (Tympanic)  Ht 6' (1.829 m)  Wt 226 lb 9.6 oz (102.785 kg)  BMI 30.73 kg/m2 General:   Alert,  Well-developed, well-nourished, pleasant and cooperative in NAD Head:  Normocephalic and atraumatic. Eyes:  Sclera clear, no icterus.   Conjunctiva pink. Mouth:  No deformity or lesions, dentition normal. Neck:  Supple; no masses or thyromegaly. Heart:  Regular rate and rhythm; no murmurs, clicks, rubs,  or gallops. Abdomen:  Soft, nondistended.  C/o mild tenderness bilat lower abd.  No masses, hepatosplenomegaly or hernias noted. Normal bowel sounds, without guarding, and without rebound.   Msk:  Symmetrical without gross deformities. Normal posture. Pulses:  Normal pulses noted. Extremities:  Without clubbing or edema. Neurologic:  Alert and  oriented x4;  grossly normal neurologically. Skin:  Intact without significant lesions or rashes. Cervical Nodes:  No significant cervical adenopathy. Psych:  Alert and cooperative. Normal mood and affect.

## 2011-05-04 NOTE — Assessment & Plan Note (Addendum)
On chronic coumadin.  Will need Lovenox bridge for colonoscopy.  Hold coumadin for 4 days prior to procedure per Dr. Jena Gauss.  Would need Lovenox 100mg  BID SQ.

## 2011-05-04 NOTE — Progress Notes (Signed)
I spoke w/ patient.  I have discussed risks & benefits which include, but are not limited to, bleeding, infection, perforation & drug reaction.  The patient agrees with this plan & written consent will be obtained.    He is, however, refusing to do a lovenox bridge due to "knots" & bleeding that he had from his abd w/ lovenox before.  I will let Dr. Jena Gauss know.

## 2011-05-04 NOTE — Assessment & Plan Note (Signed)
Kyle Love is a 70 y.o. caucasian male w/ chronic IDA, most recent hgb 11.1.  Benign EGD & SB Givens capule study this year.  Last colonoscopy last year w/ Dr Elnoria Howard.  Hx multiple colonic polyps and hx carcinoma in a polyp in 1996.  Hx diverticular bleeding.  Recent abd pain, constipation, rectal bleeding & acute colitis on CT.  CT reviewed by Dr Rourk-> most likely ischemic colitis, however he would like to proceed w/ colonoscopy to r/o polyp or occult malignancy.

## 2011-05-07 ENCOUNTER — Telehealth: Payer: Self-pay | Admitting: Cardiovascular Disease

## 2011-05-07 ENCOUNTER — Telehealth: Payer: Self-pay

## 2011-05-07 ENCOUNTER — Encounter: Payer: Self-pay | Admitting: Internal Medicine

## 2011-05-07 NOTE — Telephone Encounter (Signed)
Spoke with pt, he is back on his regular dosage of coumadin. He will have a protime checked in one week Kyle Love

## 2011-05-07 NOTE — Progress Notes (Signed)
Cc to PCP 

## 2011-05-07 NOTE — Telephone Encounter (Signed)
Follow up on blood work.

## 2011-05-07 NOTE — Telephone Encounter (Signed)
pts wife called x2 this am. Pt is not happy. He is requesting to speak to RMR asap. She stated he was still having severe abd pain and he cant eat. He wants RMR to call him this morning if possible. I advised her that RMR was doing procedures this morning and she stated he needed a call asap. His phone number is [phone].  FYI- pt saw KJ on Friday- she recommended tcs with lovenox, per KJ note pt refused lovenox. Pt is not scheduled yet for tcs.

## 2011-05-07 NOTE — Progress Notes (Signed)
I called pt today; he says abdominal pain gone. I went over recommendations regarding TCS; he is ok with it - his only concern is not wanting to take lovenox. He has afib and a prosthetic av. He may be able to just come off coumadin x 4 days (did so in past with prior TCS as he reports).  Lets clear this with Dr. [name] and get pt set up for tcs in about 3 weeks

## 2011-05-07 NOTE — Telephone Encounter (Signed)
Spoke with pt wife, pt is complaining again of abdominal pain, loss of appetite and his blood count has dropped from 11.3 to 10.3 in one week per wife report. The pt was seen by dr rourk's  pa in [place] on Friday and they are thinking about doind another colonoscopy on the pt and will be contacting dr [name] regarding the pts coumadin. The pt has said he does not want to take any shots. The family is aware dr [name] is out this week but I will keep an eye out for any correspondence from dr Jena Gauss. Dr Jena Gauss number is [phone]. Will make dr [name] aware Deliah Goody

## 2011-05-07 NOTE — Telephone Encounter (Addendum)
Pt wife would like to discuss pt coumadin with nurse. Also states that he has been having stomach problems. Also call [phone] pt daughter Arline Asp.

## 2011-05-08 ENCOUNTER — Encounter: Payer: Self-pay | Admitting: Urgent Care

## 2011-05-08 NOTE — Assessment & Plan Note (Signed)
[facility]                            CARDIOLOGY OFFICE NOTE   Mekhai, Venuto ZAEL SHUMAN                    MRN:          [id number]  DATE:02/09/2008                            DOB:          09-11-1941    Eura returns today follow-up.  He is status post aortic valve  replacement with atrial flutter and a Maze procedure.   We have cardioverted him multiple times before since he had a Maze  procedure which was maturing.  We started him on amiodarone and he just  had a DC cardioversion about 2 weeks ago.  He is maintaining sinus  rhythm.  His heart rate is relatively low.  After cardioversion was in  the mid 40s it is now 15 with first-degree block.  I told him that we  would cut his amiodarone back to 200 once a day and cut his Toprol back  to 50 a day.  My plans will then be to stop his amiodarone in a month.  He is to get his Coumadin checked today.  He knows the Coumadin may run  little thicker as we titrate back the amiodarone.  Has been doing well.  He has not had any significant recurrent palpitations, shortness of  breath or TIAs.   CURRENT MEDICATIONS:  Include Toprol to be decreased to 50 a day,  amiodarone to be decreased at 200 once a day, folic acid, aspirin a day,  vitamin B12, Coumadin as directed.   EXAM:  Remarkable for a healthy-appearing elderly white male in no  distress.  Weight is 232, blood pressure is 130/78, pulse is 53 and regular,  afebrile.  HEENT:  Unremarkable.  Carotids are without bruit, no lymphadenopathy, thyromegaly, JVP  elevation.  LUNGS:  Clear with good diaphragmatic motion.  No wheezing.  S1-S2.  Soft systolic murmur.  PMI normal.  ABDOMEN:  Benign.  Bowel sounds positive AAA, no tenderness, No  hepatosplenomegaly or hepatojugular reflux.  Distal pulses intact.  No edema.  NEURO:  Nonfocal.  SKIN:  Warm and dry.   IMPRESSION:  1. Atrial fibrillation status post cardioversion.  Continue Coumadin.  Titrate back amiodarone and Toprol for relative bradycardia.      Follow up in 4 weeks.  2. Aortic valve replacement functioning normally.  Good LV function.  3. Coronary disease status post bypass with a tissue aortic valve      replacement, with prosthetic mitral aortic valve replacement.      Continue baby aspirin a day as well as beta blocker.   Overall I think Nainoa doing well.  Hopefully he will maintain sinus  rhythm.     Noralyn Pick. [name], MD, [facility]  Electronically Signed   PCN/MedQ  DD: 02/09/2008  DT: 02/09/2008  Job #: [id number]

## 2011-05-08 NOTE — Assessment & Plan Note (Signed)
[facility]                       [place] CARDIOLOGY OFFICE NOTE   Lyan, Moyano DMITRI PETTIGREW                    MRN:          [id number]  DATE:08/12/2007                            DOB:          06-Oct-1941    Kyle Love returns today for follow-up.  He is doing well.  He is to  complete his cardiac rehab at [facility] next week.  He is status post  aortic aneurysm repair with a 27-mm Dacron root and a composite tissue  valve with reimplantation of his coronaries.  He also had a right and  left-sided radiofrequency Cox maze procedure performed by Dr. Laneta Simmers at  the end of April.   Postoperatively he had a bit of a right effusion.  He, unfortunately,  has persisted in atrial fibrillation.  We had a long discussion today  regarding possibility of a repeat cardioversion attempt after his maze  procedure is matured.  This will probably be closer to October or  November.  He will think about it.  Although he is currently  asymptomatic, he failed cardioversion x3 prior to his surgery.  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  He is doing very well.  Unfortunately, his wife  admitted that he has had a couple of cigarettes.  He had stopped smoking  prior to his surgery and these are the first two cigarettes he has had  since the end of April.  I explained to Tayshawn that he has been doing  very well.  He clinically had significant bronchitis prior to his  surgery and I explained to him that this would not be a very good thing  for the longevity of his tissue valve and he would be at much higher  risk for developing bronchitis, emphysema and lung cancer.   He was also interested in cutting back on his Toprol to 50 mg b.i.d.  I  told him this would be fine.  I told him he should be on an aspirin a  day with this study drug.   REVIEW OF SYSTEMS:  Otherwise benign.  He was inquiring about having a  PSA  since he has not had one in years.  I told him I could order one for  him as a Medicare screen.   MEDICATIONS:  1. Toprol 100 mg b.i.d., to be decreased to 50 mg b.i.d.  2. Study drug anticoagulant.  3. Folic acid.  4. An aspirin a day.   Exam is remarkable for a healthy-appearing elderly white male in no  distress.  Affect is appropriate.  Weight is 225, which is down about 7 pounds from June 2008.  Blood  pressure is 120/72, pulse is 64 and irregular, respiratory rate is 14,  he is afebrile.  HEENT:  Normal.  Carotids are normal without bruit.  There is no lymphadenopathy, no  thyromegaly, no JVD elevation, no bruits.  LUNGS:  Clear.  Good diaphragmatic motion.  Resolution of the right  effusion.  No wheezing.  There is an S1-S2.  Sternum is well-healed.  There is a soft  systolic  murmur.  There is no aortic insufficiency.  ABDOMEN:  Benign.  Bowel sounds positive.  No tenderness.  No AAA.  No  hepatosplenomegaly or hepatojugular reflux.  Distal pulses are intact.  No edema.  NEUROLOGIC:  Nonfocal.  SKIN:  Warm and dry.  There is no muscular weakness.   We did not do an EKG today.  The last one that was done in April 2008  showed continued atrial fibrillation.   IMPRESSION:  1. Status post aortic root replacement with tissue valve functioning      normally by echo and a soft systolic murmur, benign.  Follow up      echo in a year.  2. Smoking.  Continue cessation.  I strongly urged him not to resume      smoking.  I will give him Chantix or Wellbutrin if he needs it.  He      does not think that this will be the case.  I suspect he will need      follow-up PFTs in about 6 months.  3. Atrial fibrillation.  I suspect he will want to maintain rate      control and anticoagulation since he is doing so well.  However, I      will give him the opportunity to have one final attempt at      cardioversion after his Cox maze procedure in October or November.      He will continue  his study drug for anticoagulation.  4. Health maintenance.  The patient needs to follow up with his      primary care MD, has not had a PSA in over 5 years.  I took the      liberty of ordering one for him as a Medicare screen.   Overall I think Leona is doing well and looks much healthier than  before surgery.     Noralyn Pick. [name], MD, [facility]  Electronically Signed    PCN/MedQ  DD: 08/12/2007  DT: 08/13/2007  Job #: [id number]

## 2011-05-08 NOTE — Assessment & Plan Note (Signed)
[name]                            CARDIOLOGY OFFICE NOTE   NAME:Kyle Love CASSIDY TABET                    MRN:          [id number]  DATE:04/01/2009                            DOB:          [birth year]    Kyle Love is seen today in followup.  Unfortunately, he was hospitalized  from March 19 to March 15, 2009 at [facility]  I did not know  about this.  It took me quite a while to review his H&P, consultation  notes from Dr. Dietrich Pates and Dr. Daleen Squibb.  His lab work and workup according  to [place] unfortunately he had continued to smoke.  He developed a  cough.  He was seen by Dr. [name] and placed on Levaquin.  He did not  get better and subsequently was placed on steroids.  He developed some  epigastric pain.  His cough continued.  He subsequently had extremely  bad left-sided chest pain and had a spontaneous left-sided rectus sheath  hematoma.  He bled down to a hemoglobin which was as low as 8.   Apparently, his INR was supratherapeutic at that time, I suspect part of  this had to do with his amiodarone, although on March 19, his protime  appeared to be 3.6.   His amiodarone was stopped.  His Coumadin was held and reversed.  He was  transfused.  His rectus sheath hematoma has been slowly improving.   He required quite a bit of pain medicine.  Last chest x-ray and e-chart  on March 21 showed basilar subsegmental atelectasis.   CT scan from March 19 showed no significant abnormalities.  CT of the  abdomen done on the 20th showed moderate-to-large left rectus sheath  hematoma.   In talking to Kyle Love, he continues to smoke a bit.  He needs to quit.  We talked about this.  He has done a cold [place] in the past and he does  not want Wellbutrin again.  His cough has improved.  He is not short of  breath, although he has considerable ecchymosis. He has not had any  increased evidence of bleeding.  He has been off Coumadin.  He has a  tissue aortic  valve in place.  He is on Coumadin due to chronic AFib.  He has failed cardioversions x3 in the past.   I talked to [name] at length.  I told him I would like to keep him off  Coumadin an additional 3 weeks since he still has significant subdural  hematoma and ecchymosis.  He understands that there is slight increased  risk of stroke while off Coumadin.  He does not have any significant  chest pain, palpitations.  His cough has improved.  He has not had any  sputum production.  He has mild exertional dyspnea which is chronic.   REVIEW OF SYSTEMS:  Otherwise, negative.   MEDICATIONS:  His medications currently include B12, vitamins, folic  acid, aspirin, Toprol 25 three tablets in the morning, and Cardizem 360  a day.  Coumadin has been stopped.  Amiodarone has been stopped.   PHYSICAL EXAMINATION:  GENERAL:  His exam is remarkable for a bronchitic  male in no distress.  VITAL SIGNS:  Blood pressure is 130/70, pulse is 80 and irregular,  respiratory rate is 14, afebrile.  HEENT:  Unremarkable.  NECK:  Carotids are without bruit.  No lymphadenopathy.  No JVP  elevation.  LUNGS:  Some subsegmental rhonchi in the left lower lobe.  HEART:  S1 and S2 with a systolic ejection murmur and tissue aortic  valve.  PMI normal.  He has a marked ecchymosis all over his back and  left flank going around to his rectus sheath.  ABDOMEN:  Bowel sounds are positive.  Abdomen currently is soft.  No  AAA.  No tenderness.  No bruit.  No hepatosplenomegaly.  No  hepatojugular reflux.  No tenderness.  EXTREMITIES:  Distal pulses are intact.  No edema.  NEUROLOGIC:  Nonfocal.  SKIN:  Warm and dry.  MUSCULOSKELETAL:  No muscular weakness.   IMPRESSION:  1. A rectus sheath bleed.  Continue to hold Coumadin for 3 weeks.  2. Previous upper respiratory illness and cough from smoking resolved.      Levaquin course done.  3. Smoking cessation.  The patient instructed for less than 10 minutes      on this  again.  We will make him take some Wellbutrin or Chantix if      he has not stopped completely when I see him in 3 weeks.  4. Coronary disease with previous bypass graft surgery.  Continue      aspirin and not having angina.  5. Aortic valve replacement tissue, not requiring Coumadin.      Functionally sounds normal.  6. Atrial fibrillation, good rate control on beta-blocker and      Cardizem.  We will think about resuming Coumadin in 3 weeks.     Noralyn Pick. [name], MD, [facility]  Electronically Signed    PCN/MedQ  DD: 04/01/2009  DT: 04/02/2009  Job #: [id number]

## 2011-05-08 NOTE — Assessment & Plan Note (Signed)
[facility]                       [place] CARDIOLOGY OFFICE NOTE   NAME:Spraggins, KENNIS WISSMANN                   MRN:          [id number]  DATE:11/11/2007                            DOB:          07-18-1941    Dmauri returns today for followup.  He has had refractory atrial  fibrillation, failing cardioversion x4.  He is status post tissue aortic  valve replacement and Maze procedure.  He has been doing fairly well.  He cannot tolerate Coumadin.  He is essentially allergic to it, having  significant weakness and malaise and unable to maintain therapeutic  levels.  Unfortunately, he is on the RELY study drug, and the dabigatran  is reported to be stopping in a month or two.  I told Bilaal I would try  to get him the drug as a compassionate use basis.   He has done much better on this drug, compared to trying to take  Coumadin.  In regards to his heart, he has been doing well.  He has not  had any significant palpitations, PND, or orthopnea.  He has finished  cardiac rehab.  He is active.  He is not having significant chest pain.  Review of systems otherwise negative.   MEDICATIONS:  1. RELY study drug.  2. Baby aspirin daily.  3. Folic acid.  4. Toprol 100 a day.   PHYSICAL EXAMINATION:  Exam is remarkable for a healthy-appearing  elderly white male in no distress.  Weight is 235, blood pressure 130/88.  Pulse is 60 with occasional skips  consistent with A fib.  Respiratory rate 14.  Afebrile.  HEENT:  Unremarkable.  Carotids are normal without bruit.  No lymphadenopathy, thyromegaly, or  JVP elevation.  LUNGS:  Clear with good diaphragmatic motion.  No wheezing.  S1 and S2 with a soft systolic murmur.  PMI normal.  Sternotomy well  healed.  ABDOMEN:  Benign.  Bowel sounds positive.  No hepatosplenomegaly.  No  hepatojugular reflux.  Distal pulses are intact.  No edema.  NEURO:  Nonfocal.   His baseline EKG showed sinus rhythm with  nonspecific ST-T wave changes.   IMPRESSION:  1. Stable, status post tissue aortic homograft.  Baseline echo shows      good left ventricular function with no perivalvular leak.  Continue      SB prophylaxis.  2. Hypertension, currently well controlled.  Continue current low salt      diet and beta blocker.  3. Atrial fibrillation with failed cardioversion.  Tried to get      compassionate use for RELY study drug and avoid Coumadin.  Continue      Toprol for rate control.   Overall, Grant is doing well, and I will see him back in six months.     Noralyn Pick. [name], MD, [facility]  Electronically Signed    PCN/MedQ  DD: 11/11/2007  DT: 11/11/2007  Job #: [id number]

## 2011-05-08 NOTE — H&P (Signed)
NAME:  Kyle Love, Kyle Love             ACCOUNT NO.:  [id number]   MEDICAL RECORD NO.:  [id number]          PATIENT TYPE:  AMB   LOCATION:  DAY                           FACILITY:  [facility]   PHYSICIAN:  R. Roetta Sessions, M.D. DATE OF BIRTH:  [birth year]   DATE OF ADMISSION:  DATE OF DISCHARGE:  LH                              HISTORY & PHYSICAL   CHIEF COMPLAINT:  Time for colonoscopy.   HISTORY OF PRESENT ILLNESS:  The patient is a very pleasant 70 year old  gentleman with a history of colonic adenomatous polyps.  He also has a  history of sigmoid polyp with multifocal intramucosal adenocarcinoma  back in 1996.  CEA level at that time was 5.5, which was slightly  elevated; he was also a smoker at that time.  He has since had multiple  colonoscopies with adenomatous polyps.  His last colonoscopy was July  2007.  He has scattered sigmoid diverticula.  He has clustering multiple  polyps in the ascending colon all the way to the cecum.  He had multiple  3-mm diminutive polyps which were either ablated with the tip of the  snare unit or removed with cold snare.  A larger 1-cm polyp in the  ascending colon is adenomatous polyp.  There was also a sessile 1.5 x 3  cm polyp which was removed.  Most of this was piecemeal.  It was felt  that he was needed a closure followup because of his multiple polyps  seen.  He therefore presents back today.  He has not had any problems.  No blood in the stool, constipation, diarrhea, melena, rectal bleed,  abdominal pain, nausea or vomiting, heartburn, dysphagia, odynophagia.  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  He also  has atrial fibrillation, there has been trial of cardioversion several  times, but he continues to require Coumadin therapy.   CURRENT MEDICATIONS:  1. Toprol 50 mg daily.  2. Amiodarone 200 mg daily.  3. Coumadin alternating 5 mg and 7.5  mg.  4. Aspirin 325 mg daily.  5. Vitamin E daily.  6. Vitamin B12 500 mcg daily.  7. Vitamin C 2000 mg daily.  8. Folic acid 400 mg daily.   ALLERGIES:  PENICILLIN.   PAST MEDICAL HISTORY:  History of coronary artery disease, status post  CABG, history of tissue atrial valve replacement with maze procedure,  ascending aortic aneurysm repair in April 2008, history of atrial  fibrillation on chronic Coumadin, history of multiple cardioversions.  History of colon cancer within a polyp as outlined above with multiple  subsequent adenomatous polyps, requiring frequent surveillance.   FAMILY HISTORY:  Mother deceased at age 42, history of lung cancer,  never a smoker.  She apparently also had 1 of her kidneys removing for  very large kidney stone, but was not cancer.  Father died with MI at age  30, another sister died of lung cancer, was never a smoker; one brother  possibly had pancreatic cancer, and one brother died with Alzheimer  disease.  No family history of colon cancer to his knowledge.   SOCIAL HISTORY:  He is married.  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  He is doing lot of out of town work currently.  He is a  nonsmoker currently, although he has a history of chronic tobacco abuse.  No alcohol use.   REVIEW OF SYSTEMS:  GI:  See HPI for GI.  CONSTITUTIONAL:  No weight  loss.  CARDIOPULMONARY:  Denies chest pain, shortness of breath, or  cough.  GENITOURINARY:  No dysuria or hematuria.   PHYSICAL EXAMINATION:  VITAL SIGNS:  Weight 233, height 6 feet,  temperature 98.6, blood pressure 130/78, pulse 72.  GENERAL:  Pleasant, well-nourished, well-developed Caucasian male in no  acute distress.  SKIN:  Warm and dry.  No jaundice.  HEENT:  Sclerae nonicteric.  Oropharyngeal mucosa moist and pink.  No  lesion, erythema, or exudates.  No lymphadenopathy or thyromegaly.  CHEST:  Lungs are clear to auscultation.  CARDIAC:  Regular rate and rhythm.  Normal S1 and S2.   No murmurs, rubs,  or gallops.  ABDOMEN:  Positive bowel sounds.  Abdomen is soft, nondistended,  nontender.  No organomegaly or masses.  No rebound or guarding.  No  abdominal bruits or hernias.  He has diastasis recti.  LOWER EXTREMITIES:  No edema.   IMPRESSION:  Kyle Love is a 70 year old gentleman with history of  adenocarcinoma within a polyp back in 1996 and subsequent multiple  adenomatous colonic polyps removed.  He is due for 2-year followup  surveillance exam.  He has since had aortic valve replacement with maze  procedures, ascending aortic aneurysm repair.  He is on chronic Coumadin  therapy, I believe secondary to atrial fibrillation.  Given that he has  a history of multiple polyps on the last colonoscopy, would anticipate  requiring polypectomy.  Therefore, I will go ahead and contact his  cardiologist, Dr. Charlton Haws, and discuss potential of stopping  anticoagulation therapy for procedure and discussed whether or not he  needs to have any type of subacute bacterial endocarditis prophylaxis  with his heart surgery last year.  When we have discussed this with Dr.  [name], we will go ahead and schedule surveillance colonoscopy.   PLAN:  Surveillance colonoscopy in the near future with Dr. Jena Gauss, to  discuss the Coumadin issues with Dr. [name] as well as if there is any  need for SBE prophylaxis.      Tana Coast, P.AJonathon Bellows, M.D.  Electronically Signed    LL/MEDQ  D:  07/08/2008  T:  07/09/2008  Job:  [id number]   cc:   [name], M.D.  Fax: [phone]

## 2011-05-08 NOTE — Progress Notes (Signed)
Request to stop Coumadin faxed to Dr. [name].

## 2011-05-08 NOTE — Group Therapy Note (Signed)
NAME:  Kyle, Love NO.:  [id number]   MEDICAL RECORD NO.:  [id number]          PATIENT TYPE:  OBV   LOCATION:  A222                          FACILITY:  [facility]   PHYSICIAN:  Osvaldo Shipper, MD     DATE OF BIRTH:  [birth year]   DATE OF PROCEDURE:  03/15/2009  DATE OF DISCHARGE:                                 PROGRESS NOTE   SUBJECTIVE:  The patient is feeling better today.  His pain has  decreased.  His cough has improved significantly.  He is breathing  better.  He had a bowel movement yesterday morning.  He really does not  have any complaints at this time.   OBJECTIVE:  VITAL SIGNS:  Temperature 98.1, heart rate in the 110s,  respiratory rate is 16, blood pressure 127/78, saturation 95% on room  air.  GENERAL EXAM:  This is an obese white male in no distress.  HEENT:  No pallor, no icterus.  Oral mucous membranes moist.  No oral  lesions are noted.  NECK:  Soft and supple.  LUNGS:  Much better air entry with significantly lower wheezing.  HEART:  S1, S2 irregular.  No murmurs appreciated.  No S3-S4, no rubs,  no bruits.  ABDOMEN:  Still tense, he has got extensive bruising, especially on the  left side, but quite less tender compared to the last few days.  GU EXAMINATION:  Deferred.  EXTREMITIES:  Do not show any edema.   LABORATORY DATA:  His H and H is stable at 9.3, it was 9.2 earlier this  morning.  His BUN and creatinine have improved 13 and 1.0, the rest of  his labs are quite stable.   ASSESSMENT AND PLAN:  1. Rectus sheath hematoma secondary to cough and supratherapeutic      international normalized ratio.  It is improving.  Obviously, the      hematoma will take many weeks to months to completely be absorbed      and resolved.  However, it appears that he is not actively bleeding      anymore.  Dr. Leticia Penna has been assisting with the care and we      appreciate his input  2. Acute blood loss anemia secondary to the hematoma discussed above.     He required 1 unit of transfusion and his H and H is stable at this      time.  3. Atrial fibrillation with rapid ventricular response.  Because he      was wheezing, metoprolol was discontinued and we have switched him      to Cardizem.  We will continue to monitor his heart rate.  4. He is status post aneurysm repair and aortic valve replacement,      which is stable.  He has a tissue valve and is at lower risk for      thromboembolic event.  However, the risk is still present.      However, he at this time cannot be given Coumadin because of the      bleeding.  At some point before discharge we will need  to decide      (1) when he can be restarted on Coumadin and (2) if in the interim      he needs to be on aspirin or not.  5. Bronchitis, much better.  We are continuing with antitussive      agents.  Chest x-ray done a couple of days ago did not show any      pneumonia.  He has finished a 10-day course of antibiotic just a      week ago.  He is also on steroids, which can be tapered down after      he goes home.  6. He is getting deep venous thrombosis prophylaxis with sequential      compressive devices.   We will move him out of bed to chair today and maybe later this  afternoon or this evening he can be ambulated with assistance.   We will check his H and H and his BMET tomorrow and if it remains  stable, and if his heart rate is reasonably well-controlled, he should  be able to go home in the next day or two.      Osvaldo Shipper, MD  Electronically Signed     GK/MEDQ  D:  03/15/2009  T:  03/15/2009  Job:  [id number]   cc:   [name], M.D.  Fax: [phone]   Noralyn Pick. [name], MD, [facility]  [address]  [address]  [place]  [place] [address]

## 2011-05-08 NOTE — Assessment & Plan Note (Signed)
[facility]                       [place] CARDIOLOGY OFFICE NOTE   NAME:Kyle Love Kitchen Kyle Love                    MRN:          [id number]  DATE:03/25/2008                            DOB:          12-12-1941    Kyle Love returns today for follow up.  He is status post maze procedure  with AVR and CABG.  We have tried to keep him in sinus rhythm, but have  been unable to.  His last cardioversion was in January.  He held sinus  rhythm for about 6 weeks, but is back in atrial fibrillation now.  He is  asymptomatic.  He had previously been on the _________ trial for blood  thinner and unfortunately this study ended.  He is now back on Coumadin.  We cut his amiodarone back after he went back into atrial fibrillation.  We may stop it in the future.  He is on 100 mg a day, which helps  control his rate.  With the lower dose, his INR is now only 1.5 today.  This will have to be readjusted.  He denied any significant chest pain,  PND or orthopnea.  No palpitations or shortness of breath.   Unfortunately, he has started smoking again, and we had a very lengthy  discussion about this, particularly with his wife's input, she is very  aggravated by it.   CURRENT MEDICATIONS:  1. Amiodarone 100 mg a day.  2. Baby aspirin a day.  3. Coumadin as directed.  4. Toprol 50 mg a day.  5. Vitamin D.  6. Vitamin E.  7. Folic acid.   ALLERGIES:  HE IS ALLERGIC TO PENICILLIN.   REVIEW OF SYSTEMS:  Otherwise negative.   PHYSICAL EXAMINATION:  VITAL SIGNS:  Remarkable for a pulse of 70 and  regular, blood pressure is 130/80, afebrile, respiratory rate 14.  HEENT:  Unremarkable.  NECK:  Carotids normal without bruit.  No lymphadenopathy, thyromegaly  or JVP elevation.  LUNGS:  Clear.  Good diaphragmatic motion.  No active wheezing.  HEART:  S1-S2 with a soft systolic murmur.  PMI normal.  ABDOMEN:  Benign.  Bowel sounds positive.  No bruit, no AAA, no  hepatosplenomegaly.  No hepatojugular  reflex  EXTREMITIES:  Pulses intact.  No edema.  NEURO:  Nonfocal.  SKIN:  Warm and dry.  No muscle weakness.   IMPRESSION:  1. Coronary disease with coronary artery bypass grafting.  Continue      baby aspirin and low dose beta-blocker.  2. Aortic valve replacement, tissue sounds normal.  Follow-up      echocardiogram in a year.  3. Smoking.  The patient has been on Wellbutrin in the past, however,      he has also stop smoking cold [place].  He will try to do this      again, but he finds it very difficult.  The risks and plans for      smoking cessation discussed.  4. Atrial fibrillation, not chronic, good rate control.  Increasing      international normalized ratio decreased secondary to change in      amiodarone dose.  Follow-up in the Coumadin Clinic next week and      adjust dose as necessary.  I will see him back in 6 months, likely      in [place] office.     Noralyn Pick. [name], MD, [facility]  Electronically Signed    PCN/MedQ  DD: 03/25/2008  DT: 03/26/2008  Job #: [id number]

## 2011-05-08 NOTE — Assessment & Plan Note (Signed)
[facility]                            CARDIOLOGY OFFICE NOTE   NAME:STEPHENSKirklin, Mcduffee                    MRN:          [id number]  DATE:11/27/2007                            DOB:          02/03/1941    PRIMARY CARDIOLOGIST:  Noralyn Pick. [name], M.D.   PRIMARY CARE PHYSICIAN:  [name], M.D.   PATIENT PROFILE:  This is a 70 year old Caucasian male with prior  history of ascending aortic aneurysm, status post repair, AVR, and Mays  procedure who presents for [facility] Clinic or study follow-up.   PROBLEM LIST:  1. History of ascending aortic aneurysm.      a.     April 16, 2007, ascending aortic aneurysm, replacement of 27       mm Medtronic aortic root and heart valve prosthesis with composite       Dacron 2 graft and reimplantation of the left and right coronary       arteries.  2. Chronic atrial fibrillation.      a.     Status post Cox-Mays procedure at the time of aortic       surgery, April 16, 2007.  3. New onset atrial flutter.  4. Nonobstructive coronary artery disease.      a.     April 03, 2007, nonobstructive coronary artery disease.  5. Hypertension.   HISTORY OF PRESENT ILLNESS:  This is a 70 year old Caucasian male with  the above problem list.  He presents today for Rely study follow-up.  He  had been on the Rely study drug, however, will be coming off of this and  be placed on Coumadin now that the study has ended for him.  He has been  doing well and has not had any melena or bright red blood and overall,  no significant bleeding at all.  He denies any chest pain, palpitations,  shortness of breath, PND, orthopnea, dizziness, syncope, or edema.  Of  note his ECG today shows atrial flutter at a rate of 117 beats per  minute.  During his last visit, November 18, his heart rate was 64 and  irregular.  We do not have any ECG at that time.  The patient has not  noticed any tachy palpitations.   ALLERGIES:  PENICILLIN.   HOME  MEDICATIONS:  1. Rely study drug now to be Coumadin.  2. Vitamin B, Vitamin C, and Vitamin E.  3. Folic acid 1 mg daily.  4. Aspirin 325 mg daily.  5. Toprol XL 100 mg daily.  6. Vitamin A daily.  7. Vitamin B12 daily.  8. Multivitamin daily.   PHYSICAL EXAMINATION:  Blood pressure 126/82, heart rate 117,  respirations 16, he was afebrile.  He has a weight of 229 pounds which  is down six pounds since his last visit.  This is a pleasant white male in no acute distress.  He was awake,  alert, and oriented x3.  NECK:  No bruits or JVD.  LUNGS:  Respirations regular and unlabored.  Clear to auscultation.  CARDIAC:  Irregular, tachycardic, S1/S2.  No S3, S4, or murmurs.  ABDOMEN:  Round, soft, nontender, nondistended.  Bowel sounds present  x4.  EXTREMITIES:  Warm, dry, pink, with trace bilateral lower extremity  edema above the sock line.   ACCESSORY CLINICAL FINDINGS:  EKG shows atrial flutter at a rate of 117  beats per minute.  There are no acute ST or T changes.   ASSESSMENT/PLAN:  1. Presumably new onset atrial flutter.  This appears to have occurred      sometime between his last visit on November 18 and today.  The      patient is asymptomatic.  I have reviewed his case with Dr. [name]      and together we have reviewed the case with Dr. Ladona Ridgel as well.      Dr. Ladona Ridgel feels that this is most likely a left atrial flutter, as      is commonly seen in patients status post Maze procedure, and thinks      that he is not an ideal ablation candidate.  Dr. Ladona Ridgel recommends      continuation of rate control and we have decided today to      reinstitute the patient's amiodarone at 200 mg b.i.d.  This was      previously discontinued in June of 2008, as it was initially      prescribed to help convert the patient from atrial fibrillation,      but that was not successful.  As we are initiating amiodarone, we      will repeat LFTs and PFTs today.  We will also check a BMET and       magnesium.  He will resume Coumadin therapy and will have Coumadin      clinic follow-up.  Of note the patient has a history of fatigue      when on Coumadin and has been given the option of subcutaneous      Lovenox or Fondaparinux and he clearly says he would prefer the      Coumadin.  He will be traveling and spending a lot of time for his      job in [place] and we have provided him with a prescription to      have INRs checked at [facility] with results faxed to our      Coumadin clinic.  Will have him follow up with Dr. [name] in      approximately 4-6 weeks in [place] for further evaluation of his      atrial flutter and potential medication management.  2. Atrial fibrillation.  As noted above the patient is in atrial      flutter today.  3. Status post aortic root replacement with AVR and resuspension or      coronary arteries.  This appears to be stable.   DISPOSITION:  As above the patient will follow up with Dr. [name] in  [place] in about 4-6 weeks.      Nicolasa Ducking, ANP  Electronically Signed      Noralyn Pick. [name], MD, [facility]  Electronically Signed   CB/MedQ  DD: 11/27/2007  DT: 11/28/2007  Job #: [id number]

## 2011-05-08 NOTE — Procedures (Signed)
NAME:  Kyle Love, Kyle Love NO.:  [id number]   MEDICAL RECORD NO.:  [id number]          PATIENT TYPE:  OUT   LOCATION:  RAD                           FACILITY:  [facility]   PHYSICIAN:  Gerrit Friends. Dietrich Pates, MD, FACCDATE OF BIRTH:  June 26, 1941   DATE OF PROCEDURE:  05/22/2007  DATE OF DISCHARGE:                                ECHOCARDIOGRAM   CLINICAL DATA:  This is a 70 year old gentleman with prior combined CABG  and AVR surgery.   TECHNIQUE:  1. Technically adequate echocardiographic study.  2. Mild left and right atrial enlargement.  3. Right ventricular size at the upper limit of normal; normal RV      systolic function.  4. Aortic valve is a trileaflet device with delicate function,      apparently a bioprosthetic valve if the history of AVR is accurate.      There is no insufficiency and no stenosis.  5. The aortic root is normal in diameter; there is mild calcification      of the wall.  6. Normal mitral valve; trace regurgitation; mild to moderate annular      calcification.  7. Normal tricuspid valve with physiologic regurgitation.  8. Normal proximal pulmonary artery; pulmonic valve not adequately      imaged.  9. Normal internal dimension, wall thickness, regional and global      function of the left ventricle.  10.Very small posterior pericardial effusion.      Gerrit Friends. Dietrich Pates, MD, [facility]  Electronically Signed     RMR/MEDQ  D:  05/22/2007  T:  05/23/2007  Job:  [id number]

## 2011-05-08 NOTE — H&P (Signed)
NAME:  Kyle Love, [name] NO.:  [id number]   MEDICAL RECORD NO.:  [id number]          PATIENT TYPE:  EMS   LOCATION:  ED                            FACILITY:  [facility]   PHYSICIAN:  Osvaldo Shipper, MD     DATE OF BIRTH:  [birth year]   DATE OF ADMISSION:  03/11/2009  DATE OF DISCHARGE:  LH                              HISTORY & PHYSICAL   PRIMARY CARE PHYSICIAN:  [name], M.D.   CARDIOLOGIST:  Noralyn Pick. [name], MD, F.A.C.C.   ADMITTING DIAGNOSES:  1. Rectus sheath hematoma secondary to coughing spells.  2. Supratherapeutic INR with INR of 3.6.  3. History of atrial fibrillation.  4. Hypertension.  5. Tobacco abuse.   CHIEF COMPLAINT:  Abdominal pain.   HISTORY OF PRESENT ILLNESS:  Patient is a 70 year old Caucasian male who  has a history of A-fib and is status post aortic aneurysm repair and  aortic valve replacement.  He was in his usual state of health until  about a week and a half ago, when he started developing cough.  He went  to his PMD's office and was diagnosed with bronchitis.  He was given  Levaquin for ten days and was started on steroids.  He is still on the  steroid taper.  He has finished the course of Levaquin.  He continues to  have a cough and then, last night, he started feeling pain in the left  upper part of his abdomen.  He noticed a swelling there.  The pain was  8/10 in intensity.  The swelling started to increase in size.  He did  not experience any nausea or vomiting, no fever or chills, no chest  pain.  The cough has whitish expectoration.  He took four Tylenols over  the course of the night with minimal relief of the pain.  He now denies  any shortness of breath or leg-swelling.  This morning he decided to  come into the hospital for further evaluation.   MEDICATIONS AT HOME:  1. He is on Toprol XL 50 mg daily.  2. Aspirin 325 mg daily.  3. Vitamin C.  4. Vitamin E.  5. Folic acid.  6. Coumadin 7.5 mg Monday, Wednesday, Friday,  5 mg on Tuesday,      Thursday, Saturday and Sunday.  This dose was adjusted yesterday by      his Coumadin clinic for INR of 3.2.  7. Amiodarone 200 mg daily.  8. Prednisone - he is on the taper at this time.  9. Advair, unknown dose.  10.He is also on another kind of inhaler, probably albuterol.   ALLERGIES:  Include PENICILLIN, which causes hives and possible  angioedema.   PAST MEDICAL HISTORY:  Positive for aortic aneurysm and aortic valve  replacement in April of 2008.  History of A-fib and he is status post  MAZE procedures.  He is on anticoagulation for the same.  Denies any  history of coronary artery disease.  Denies any history of diabetes.   SOCIAL HISTORY:  Lives in [place].  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  He continues to smoke one to two  cigarettes every day.  No alcohol use, no illicit drug use.   FAMILY HISTORY:  Unremarkable per the patient.   REVIEW OF SYSTEMS:  GENERAL:  Positive for weakness.  HEENT:  Unremarkable.  CARDIOVASCULAR:  Unremarkable.  RESPIRATORY:  Unremarkable.  GI:  As in HPI.  GU:  Unremarkable.  NEUROLOGIC:  Unremarkable.  PSYCHIATRIC:  Unremarkable.  MUSCULOSKELETAL:  Unremarkable.  DERMATOLOGIC:  Unremarkable.   PHYSICAL EXAMINATION:  VITAL SIGNS:  Temperature 98.0, blood pressure  143/87, heart rate 66, respiratory rate 20, saturation 96% on room air.  GENERAL EXAM:  This is an obese white male in no distress.  HEENT:  There is no pallor, no icterus.  Oral mucous membrane is moist.  No oral lesions are noted.  NECK:  Soft and supple.  No thyromegaly is appreciated.  LUNGS:  Some amount of wheezing is noted, quite scattered.  CARDIOVASCULAR:  S1, S2 is normal, regular.  No murmurs appreciated.  No  S3, S4, no rubs, no bruits.  ABDOMEN:  Soft, obese.  There is a lump, which measures about 8 cm by 8  cm, in the left upper quadrant of the abdomen.  It is tender to  palpation.  It is quite firm to palpation.   He also has bruising around  the umbilicus to the left side.  Bowel sounds are present.  No other  masses are appreciated.  EXTREMITIES:  Showed no edema.  Peripheral pulses are palpable.  NEUROLOGIC:  He is alert, oriented times three.  No focal neurological  deficits are present.   LABORATORY DATA:  His hemoglobin is 14.2, platelet count is 220.  INR is  3.6.  His BMET reveals a sodium of 134, potassium is 4.9, glucose is  138, renal function is normal.   A CT of the abdomen and pelvis was done which reveals scarring at the  lung bases, hematoma within the upper left rectus abdominis muscle and  nonobstructing renal calculi.   An EKG was done which shows A-fib at a rate of 92, no Q-waves are  present, no other acute ST or T-waves are noted.   ASSESSMENT:  This is a 70 year old Caucasian male who has a past medical  history of hypertension and A-fib, who is on Coumadin, who presents with  abdominal pain and is found to have rectus sheath hematoma.  According  to the CT, this is not a large hematoma.  His abdomen is quite tender  because of this hematoma.  I think the etiology for this is the coughing  spells that he has been experiencing for the past week and a half,  secondary to his bronchitis.  No other trauma has been present.   PLAN:  Rectus sheath hematoma:  I have discussed the case with Dr.  Leticia Penna, who recommends warm compresses and monitoring.  We will hold  his Coumadin.  No need to reverse his INR at this time as blood pressure  and hemoglobin are both normal.  We will provide pain control.  We will  recheck his hemoglobin later tonight and tomorrow.  If the pain is  controlled with oral agents and if his hemoglobin remains stable, he  should be able to go home by tomorrow.  To suppress his cough I will put  him on Tessalon and Tussionex.  I will also continue his prednisone.  No  need for any antibiotics at this time, as he has completed  a course.  The rest of his  home medications will also be continued.  Smoking  cessation counseling was provided.  I will also hold off on his aspirin  for today and then, on Monday, he can go back to the Coumadin clinic to  have his dose of Coumadin adjusted.   Further management decisions will depend on results of further testing  and patient's response to treatment.      Osvaldo Shipper, MD  Electronically Signed     GK/MEDQ  D:  03/11/2009  T:  03/11/2009  Job:  [id number]   cc:   [name], M.D.  Fax: [phone]   Noralyn Pick. [name], MD, [facility]  [address]  [address]  [place]  [place] [address]

## 2011-05-08 NOTE — Assessment & Plan Note (Signed)
[facility]                       [place] CARDIOLOGY OFFICE NOTE   Dawson, Albers SRIMAN TALLY                    MRN:          [id number]  DATE:05/29/2007                            DOB:          06-04-1941    [name] returns today for followup.  He is status post aortic route  replacement with the tissue valve for an ascending aortic aneurysm and  the aortic stenosis/insufficiency and a bicuspid valve.   His postoperative course was remarkable for continued atrial  fibrillation.  The patient was markedly volume overloaded.  He was  discharged on some Lasix.   In regards to his valve, he had a tissue valve and a Gore-Tex root  replacement.  He has not had any significant recurrent chest pain.  He  had some shortness of breath postop from volume overload.  He had a  chest x-ray done on May 06, 2007, which showed marked cardiomegaly.  I  was concerned about this in regards to a residual effusion, particularly  since he is on anticoagulants for his atrial fibrillation.  The  echocardiogram, basically showed a very small posterior pericardial  effusion.  The aortic root was thought to be normal in diameter; and he  had a normal tissue AVR without stenosis or regurgitation.   I personally reviewed the echocardiogram from May 22, 2007, which took  approximately 10 minutes.   In talking to Arvil, he still has some poor stamina.  He likes to walk  outside; and feels that he gets a little dyspneic with this.   Otherwise, he has not had significant chest pain, PND, or orthopnea.  He  does not notices atrial fibrillation.  There been no palpitations or  syncope.   The review of systems are remarkable for some sensitivity to the sun.  He is concerned about this as he is going to [employer] in July.  I  told him that we would stop his amiodarone at this point since he has  not maintained sinus rhythm and has failed cardioversions.  He will  continue rate control with his Toprol.   CURRENT MEDICATIONS:  1. Toprol 100 b.i.d.  2. A study drug as a Coumadin substitute.  3. Folic acid.  4. An aspirin a day and multivitamins.   PHYSICAL EXAMINATION:  On exam he looks extremely well, actually better  than preop.  His weight is down at 227.  Blood pressure is 132/80.  Respiratory rate  is 16.  Pulse is 78 and irregular.  He is afebrile.  HEENT:  Normal.  There are no carotid bruits.  No lymphadenopathy, no  JVP, elevation.  LUNGS:  Have continued rales and an effusion at the right base.  There  is normal diaphragmatic motion.  There is an S1 and S2 without murmur, rub, gallop, or click.  PMI is  mildly increased but not displaced.  Sternum is well healed.  ABDOMEN:  Benign.  Bowel sounds are positive.  There is no  hepatosplenomegaly, no hepatojugular reflux.  There is no AAA, no  tenderness.  Femorals are +3 bilaterally and without bruit.  Lower extremities have trace edema  which is markedly improved from  hospital discharge.  NEUROLOGIC:  Nonfocal.  There is no muscular weakness.  No varicosities  and no peripheral lymphedema.   As indicated, I personally reviewed his echocardiogram from May 29, as  well as his chest x-ray from May 13; and compared it to April 18, 2007 -  - he continues to have small bilateral effusions.   IMPRESSION:  1. Stable, status post tissue aortic valve replacement with route      replacement.  Continued mild volume overload.  The patient will      continue his Lasix, that I gave him at hospital discharge for about      10 days.  I will see him at the end of June to reevaluate.  2. In regards to his tissue valve, it is functioning normally by echo.      His aortic root appears normal in size, probably at the 3-to-6-      month mark we may do a CT to further assess his arch.  The      patient's dyspnea is likely related to postop anemia with      deconditioning and a continued right pleural  effusion.  Hopefully,      the diuretic in time will take care of this.  3. In regards to his atrial fibrillation, we will continue the study      drug for anticoagulation.  He will continue his Toprol for rate      control.  We will stop his amiodarone, as it is not doing any good      as an anti-conversion agent; and he is concerned about its multiple      long-term side effects.  I will see the patient back to further      assess his pleural effusion and volume status.  Overall, I am      pleased with his progress.     Noralyn Pick. [name], MD, [facility]  Electronically Signed    PCN/MedQ  DD: 05/29/2007  DT: 05/29/2007  Job #: [id number]

## 2011-05-08 NOTE — Discharge Summary (Signed)
NAME:  Kyle Love, Kyle Love NO.:  [id number]   MEDICAL RECORD NO.:  [id number]          PATIENT TYPE:  INP   LOCATION:  2008                         FACILITY:  [facility]   PHYSICIAN:  Evelene Croon, M.D.     DATE OF BIRTH:  01-20-1941   DATE OF ADMISSION:  04/16/2007  DATE OF DISCHARGE:  04/22/2007                               DISCHARGE SUMMARY   PRIMARY DIAGNOSIS:  Bicuspid aortic valve descending aortic aneurysm.   HOSPITAL DIAGNOSES:  1. Volume overload postoperatively.  2. Acute blood loss anemia.   SECONDARY DIAGNOSES:  1. Chronic atrial fibrillation.  2,  History of colon polyps.  1. Status post sigmoidectomy.  2. Tonsillectomy.  3. Tobacco abuse.   OPERATIONS AND PROCEDURES:  1. Right-sided and left-sided radiofrequency Cox maze procedure.  2. Placement of  aortic valve in the ascending aortic aneurysm using      27-mm Medtronic __________  aortic root heart valve prosthesis and      composite Dacron 2 graft  with reimplantation of left and right      coronary artery.   THE PATIENT'S HISTORY AND PHYSICAL AND HOSPITAL COURSE:  The patient is  a 70 year old gentleman whom Dr. Laneta Simmers has been following since December 24, 2004, with any ascending aortic aneurysm.  This has been increasing  in size slowly.  Since it was slowly increasing in size and was 5.5 cm,  it was decided to continue following for a while longer.  The patient  has been followed by Dr. [name] for atrial fibrillation and bicuspid  aortic valve with some degree of aortic stenosis.  He subsequently  underwent CT scan of chest February 14, 2007, that showed the maximum  diameter of the descending aorta had enlarged to 5 x 5.5 cm.  There was  some calcification of the aortic valve.  The aneurysm appeared to taper  down to about 3.5 cm proximal aortic arch and  2.70 cm, and distal arch  was normal caliber of the descending thoracic aorta.  The patient was  evaluated by Dr. Laneta Simmers.  Dr. Laneta Simmers  discussed with the patient now that  the aneurysm  had reached 5.5 cm and it was time to undergo surgical  repair.  He discussed risks and benefits with the patient.  The patient  acknowledged understanding and agreed to proceed.  Surgery was scheduled  for April 16, 2007.  For details of the patient's Past Medical History  and physical exam, please see dictated H&P.   The patient was taken to the operating room April 16, 2007, where he  underwent right and left-sided radiofrequency Cox maze procedure as well  as replacement of aortic valve in ascending aortic aneurysm using a 27  mm Medtronic aortic root heart valve prosthesis composite Dacron 2 graft  with reimplantation of left and right coronary artery.  The patient  tolerated this procedure well and was transferred to the intensive care  unit in stable condition.   Postoperatively, patient noted to be hemodynamically stable.  The  patient was extubated by the evening or early morning following surgery.  Following extubation, patient  noted to be alert and oriented x4.   Postop day #1, the patient's vital signs remained stable.  He was weaned  from all drips.  Chest x-ray was within normal limits, and the patient  had minimal drainage from chest tubes.  Chest tubes were discontinued.  The patient was out of bed.  During the patient's postoperative course,  he did continue to go in and out of atrial fibrillation.  He was started  on amiodarone p.o. and planned to restart the study drug  __________  in  1 week for Dr. [name].   The patient was transferred out 2000 on postop day #2.  The patient's  vital signs were followed closely.  Blood pressure remained stable.  The  patient was able to be weaned off oxygen saturating greater than 90% on  room air.  He remained afebrile.  The patient did have volume overload  requiring diuretics.  The patient was back near baseline prior to  discharge home.  The patient's pulmonary status remained  stable.  His  incisions were clean, dry and intact and healing well.  The patient was  out of bed and ambulating well without difficulty.  The patient was  tolerating diet well.  No nausea, vomiting noted.   Last lab April 25 showed a white count 19.1, hemoglobin 8.49, hematocrit  24.9, platelet count 96.  Sodium p.m. April 29 noted sodium 139,  potassium 4.5, chloride of 95, bicarb of 37, BUN of 11, creatinine 0.93,  glucose of 110.   Patient is tentatively ready for discharge home postop day #7, April 23, 2007.   FOLLOWUP APPOINTMENT:  The patient will need to follow up with Dr.  Laneta Simmers for in 3 weeks.  Our office will contact the patient at this  information.  The patient needs to follow up with Dr. [name] in 2 weeks.  He will need to call Dr. Fabio Bering office to arrange this appointment.   ACTIVITY:  Patient instructed no driving until released to do so, no  lifting over 10 pounds.  He is told to ambulate 3-4 times per day,  progress as tolerated, and to continue breathing exercises.   INCISIONAL CARE:  The patient told to shower, washing his incisions  using soap and water.  He is to contact the office if he develops any  drainage or opening from any of his incision sites.   DIET:  The patient educated on diet to be low-fat, low-salt.   DISCHARGE MEDICATIONS:  1. Aspirin 33.5 mg daily.  2. Toprol XL 100 mg b.i.d.  3. Xopenex 45 mcg inhaler 2 puffs 3 times a day.  4. Amiodarone 400 mg b.i.d. x14 days, then 200 mg b.i.d..  5. Lasix 40 mg daily x7 days.  6. Potassium chloride 20 mEq daily x7 days.  7. Folic acid daily.  8. Multivitamin daily.  9. __________  as per Dr. [name].  10.Oxycodone 5 mg 1-2 tablets q. 4-6 hours p.r.n. pain.      Theda Belfast, [place]      Evelene Croon, M.D.  Electronically Signed    KMD/MEDQ  D:  04/22/2007  T:  04/22/2007  Job:  [id number]   cc:   Noralyn Pick. [name], MD, [facility]

## 2011-05-08 NOTE — Assessment & Plan Note (Signed)
Kyle Love                            CARDIOLOGY OFFICE NOTE   NAME:Kyle Love, Kyle Love                    MRN:          [id number]  DATE:01/16/2008                            DOB:          01/04/1941    [name] returns today for follow-up.   He is status post aortic valve replacement.  Has a tissue aortic valve.  He has had a flutter ablation with a Maze procedure.  Unfortunately,  continues to be in A fib/flutter.  Last time I saw him I showed his EKG  Dr. Ladona Ridgel who felt he had a left-sided flutter which would not be  amenable to ablation.  He was on an experimental anti-thrombin.  Unfortunately, this was an experimental drug and is not available to the  patient now.  He has increasing fatigue on Coumadin which he had before.  His INR was therapeutic on December 19 but dipped to 1.7 December 29.  He has been therapeutic since January 2.  I had a long discussion with  [name] and his wife.  He would like to come off amiodarone and Coumadin  if at all possible.  We will check his EKG today.  So long as his INR is  therapeutic I will try to do a cardioversion in 2 weeks and then try to  taper off amiodarone.  [name] knows that, if the quality of life on  Coumadin is too poor he has the option of stopping it and taking a  slightly higher risk of 3 to 4% per year of stroke.   The the patient's LFTs were normal on November 28, 2007.  Thyroid, TSH  was 1.0 will have to look back at the chart to see was baseline PFTs  are.   He has not been having palpitations.  He has been working at [employer] 40 hours a week and doing well.  He does notice increasing  fatigue on Coumadin.   He is allergic to PENICILLIN.   His medications include amiodarone 200 b.i.d., Coumadin as directed,  multivitamins, Toprol 100 a day, an aspirin a day, folic acid and  vitamins.   EXAM:  Is remarkable for a healthy-appearing middle-aged white male in  no distress.  Weight is 232, blood pressure is 130/70, pulse is 90 and irregular,  respiratory rate 14, afebrile.  Affect appropriate.  HEENT:  Unremarkable.  Carotids are without bruit, no lymphadenopathy, thyromegaly, JVP  elevation.  LUNGS:  Clear diaphragmatic motion.  No wheezing.  S1-S2 with soft systolic murmur.  No aortic insufficiency.  PMI normal.  ABDOMEN:  Benign.  Bowel sounds positive.  No AAA, no  hepatosplenomegaly.  No hepatojugular reflux.  Distal pulse intact, no edema.  SKIN:  Warm and dry.  No muscle weakness.   IMPRESSION:  1. Atrial fibrillation, check EKG today and INR today possible      elective cardioversion in 2 weeks.  2. Status post aortic valve resection, functioning normally good left      ventricular function.  No evidence of volume overload.  3. Amiodarone surveillance 200 b.i.d.  The patient has had normal  thyroid liver tests will have to check PFTs if we keep him on it      for any length of time.  This will depend on how was cardioversion      goes   Further recommendations will be based on results of his EKG and INR  today.  I suspect we will proceed with outpatient cardioversion in 2  weeks.     Noralyn Pick. [name], MD, [facility]  Electronically Signed    PCN/MedQ  DD: 01/16/2008  DT: 01/16/2008  Job #: [id number]

## 2011-05-08 NOTE — Op Note (Signed)
NAME:  Kyle Love, Kyle Love             ACCOUNT NO.:  [id number]   MEDICAL RECORD NO.:  [id number]          PATIENT TYPE:  AMB   LOCATION:  DAY                           FACILITY:  [facility]   PHYSICIAN:  R. Roetta Sessions, M.D. DATE OF BIRTH:  Aug 10, 1941   DATE OF PROCEDURE:  07/23/2008  DATE OF DISCHARGE:                               OPERATIVE REPORT   PROCEDURE:  Colonoscopy with polyp ablation and cold biopsy.   A 70 year old gentleman with a history of adenocarcinoma within the  polyp back in 1996, he has had multiple colonoscopies and had multiple  polyps removed since that time.  Last colonoscopy was in 2007.  He had  multiple polyps removed from his ascending colon at that time.  He has  done well clinically.  Since then, he has had an aortic aneurysm repair  and a valve replacement and on chronic Coumadin therapy.   He comes for surveillance today.  Bridging therapy has been handled  thankfully by Dr. [name].  He stopped Coumadin.  One week ago, he has  been on Coumadin, but unfortunately, he continues to take Lovenox and  took a dose until yesterday.  Colonoscopy is now being done to survey  his colon and check for recurrent polyp disease.  The risks, benefits,  alternatives, and limitations have been reviewed.  Questions answered.  Please see documentation in the medical record.   PROCEDURE NOTE:  O2 saturation, blood pressure, pulse, and respirations  were monitored throughout the entire procedure.   CONSCIOUS SEDATION:  Versed 3 mg IV and Demerol 50 mg IV in divided  doses.   The patient received SB prophylaxis in the way of vancomycin 1 g and  gentamicin 120 mg IV prior to the procedure.   INSTRUMENT:  Pentax video chip system.   FINDINGS:  Digital rectal exam revealed no abnormalities.  Endoscopic  findings:  The prep was adequate.  Colon:  Colonic mucosa was surveyed  in the rectosigmoid junction through the left transverse and right colon  to the appendiceal orifice,  ileocecal valve, and cecum.  These  structures were well seen and photographed for the record.  From this  level, the scope was slowly and cautiously withdrawn.  All previously  mentioned mucosal surfaces were again seen.  Following abnormalities  were noted:  1. The patient had left-sided transverse diverticula.  2. The patient had a 5-mm polyp in the mid ascending colon, which was      removed and ablated with one pass of cold snare.  There was minimal      bleeding with this maneuver.  No tissue was recovered from the      pathologist.  Secondly, there was a diminutive polyp in the mid      sigmoid, which was cold biopsied/removed.  There was minimal      bleeding with this maneuver as well.  The remainder of the colonic      mucosa appeared normal.  The scope was pulled down the rectum with      thorough examination of the rectal mucosa including retroflexed  view of the anal verge demonstrated no abnormalities.  The patient      tolerated the procedure well and was reactive to Endoscopy.   IMPRESSION:  1. Normal rectum.  2. Left-sided transverse diverticula.  3. Diminutive polyp, status post cold biopsy removal.  4. A 5-mm polyp mid ascending colon status post removal ablation with      a cold snare loop.  The remainder of the colonic mucosa appeared      normal.   RECOMMENDATIONS:  1. He is to continue Lovenox and he will resume his Coumadin today.      Follow up of INR and cessation of Lovenox therapy to be determined      by Dr. [name].  2. Follow up on Path.  3. Further recommendations to follow.      Jonathon Bellows, M.D.  Electronically Signed     RMR/MEDQ  D:  07/23/2008  T:  07/24/2008  Job:  [id number]   cc:   [name], M.D.  Fax: [phone]   Noralyn Pick. [name], MD, [facility]  [address]  [address]  [place]  [place] [address]

## 2011-05-08 NOTE — Consult Note (Signed)
NAME:  Kyle Love, Kyle Love NO.:  [id number]   MEDICAL RECORD NO.:  [id number]          PATIENT TYPE:  OBV   LOCATION:  A222                          FACILITY:  [facility]   PHYSICIAN:  Thomas C. Wall, MD, FACCDATE OF BIRTH:  [birth year]   DATE OF CONSULTATION:  03/14/2009  DATE OF DISCHARGE:                                 CONSULTATION   CARDIOLOGIST:  Dr. Charlton Haws.   PRIMARY CARE PHYSICIAN:  Dr. [name].   REASON FOR CONSULTATION:  Atrial fibrillation; rectus sheath hematoma.   HISTORY OF PRESENT ILLNESS:  Kyle Love is a 70 year old male with a  history of bicuspid aortic valve and ascending aortic aneurysm status  post tissue aortic valve replacements and aneurysm repair by Dr. Laneta Simmers  in April 2008 with permanent atrial fibrillation/flutter status post  failed Cox maze procedure as well as cardioversion.  He developed  bronchitis recently and was placed on Avelox.  He apparently had a  significant cough and developed left abdominal pain.  He came to the  emergency room for evaluation.  CT confirmed left rectus sheath  hematoma.  His hemoglobin has dropped and the hematoma has enlarged by  CT scan.  He has received 2 units of FFP and 1 unit - blood cells.  He  has been taken off of his Coumadin and INR is now 1.5.  He denies any  recent chest pain or dyspnea on exertion.  He denies orthopnea, PND or  pedal edema.  Denies any palpitations or syncope.  We are now asked to  further evaluate his atrial fibrillation in the setting of his rectus  sheath hematoma and need for Coumadin therapy.   PAST MEDICAL HISTORY:  1. Bicuspid aortic valve with ascending aortic aneurysm as outlined      above.  The patient received a 27 mm Medtronic freestyle aortic      root heart valve prosthesis in April 2008.  2. Nonobstructive coronary disease by cardiac catheterization April      2008:  Left main 30% mid - distal, LAD 20-30% proximal/mid, RCA 30%      proximal, 20%  distal.  3. Permanent atrial fibrillation/flutter status post Cox maze      procedure April 2008.  He was previously on the RE-LY study.  He      has been noted to have left-sided flutter that is not thought to be      amenable to catheter ablation.  He is maintained on amiodarone      therapy and underwent cardioversion in February 2009.  4. COPD.  5. Status of hemorrhoid surgery.   MEDICATIONS AT HOME:  1. Toprol XL 50 mg daily.  2. Aspirin 325 mg daily.  3. Vitamin C.  4. Folic acid.  5. Coumadin 5 mg daily except for 7.5 mg on Monday, Wednesday Friday.  6. Amiodarone 200 mg daily.  7. Prednisone 10 mg daily.  8. Avelox 400 mg daily.   ALLERGIES:  Penicillin.   SOCIAL HISTORY:  The patient lives in [place] with his wife.  He has  a 50+ pack-year history of smoking and  continues to smoke a pack of  cigarettes per day.  He denies alcohol abuse.   FAMILY HISTORY:  Significant for his brother having a pacemaker.  No  premature CAD is noted.   REVIEW OF SYSTEMS:  Please see HPI.  Denies fevers, headache, syncope or  near syncope, dysuria, hematuria, bright red blood per rectum or melena.  Rest of the review of systems are negative.   PHYSICAL EXAMINATION:  GENERAL:  He is a well-nourished, well-developed  male.  No acute distress.  VITAL SIGNS:  Blood pressure 132/104, pulse 110, respirations 16,  temperature 97.6, oxygen saturation 92% on room air.  HEENT:  Normal.  NECK:  Without JVD.  LYMPH:  Without lymphadenopathy.  ENDOCRINE:  Without thyromegaly.  CARDIAC:  Normal S1, S2.  Regular rate and rhythm without murmur.  LUNGS:  With expiratory rhonchi bilaterally.  SKIN:  He does have a dark nevus noted at the anterior axillary line on  the right.  This has been evaluated by a dermatologist recently.  ABDOMEN:  Moderate ecchymosis over the left periumbilical region with  moderate tenderness.  EXTREMITIES:  With trace edema bilaterally.  MUSCULOSKELETAL:  Without joint  deformity.  NEUROLOGIC:  He is alert and oriented x3.  Cranial nerves II-XII grossly  intact.   STUDIES:  Chest x-ray:  Subsegmental atelectasis.  Abdominal pelvic CT  from March 20 demonstrated enlarging moderate to large left rectus  sheath hematoma with small amount of anterior low pelvic extraperitoneal  hemorrhage.  EKG:  Atrial fibrillation, heart rate 92, normal axis, no  acute changes.   LABORATORY DATA:  White count 18,000, hemoglobin 9.5, platelet count  169,000, MCV 96.  Potassium 4.5, creatinine 1.21, glucose 117, INR 1.5.  His INR on admission was 3.6.   ASSESSMENT:  1. Left rectus sheath hematoma secondary to cough in the setting of      supratherapeutic INR.  His Coumadin has been reversed with FFP.  He      has significant anemia that has been treated with packed red blood      cells.  2. Atrial fibrillation/flutter with rapid ventricular rate status post      Cox maze procedure and April 2008 and cardioversion in February      2009.  He remains in atrial ablation/flutter and is on chronic      Coumadin therapy.  3. Bicuspid aortic valve with ascending aortic aneurysm status post      tissue AVR/aneurysm repair April 2008.  4. COPD with recent exacerbation.  5. Transient renal insufficiency - resolved.   RECOMMENDATIONS:  The patient was also interviewed examined by Dr. Daleen Squibb.  His hematoma resulted in significant cough in the setting of elevated  INR secondary to his antibiotic therapy amiodarone.  He has had no  history of CVA.  He had a tissue aortic valve replacement in 2008.  His  risk of bleeding is greater than his thromboembolic risk at this time.  He is clinically stable.  He remains in atrial fibrillation/flutter.  His diltiazem has been adjusted.  He has been placed on diltiazem, and  this will be adjusted for better rate control.  We will also restart  his Toprol XL 50 mg a day.  His amiodarone will be continued for now.  His Coumadin can be restarted  once his rectus sheath hematoma has  completely resolved.  Thank you very much for this consultation.  We  will be glad to follow the patient throughout the remainder of this  admission.      Kyle Newcomer, PA-C      Kyle Sans. Daleen Squibb, MD, [facility]  Electronically Signed    SW/MEDQ  D:  03/14/2009  T:  03/15/2009  Job:  [id number]   cc:   Noralyn Pick. [name], MD, [facility]  [address]  [address]  [place]  [place] [address]   [name], M.D.  Fax: [phone]

## 2011-05-08 NOTE — Consult Note (Signed)
NAME:  Kyle Love, Kyle Love NO.:  [id number]   MEDICAL RECORD NO.:  [id number]          PATIENT TYPE:  OIB   LOCATION:  2852                         FACILITY:  [facility]   PHYSICIAN:  Noralyn Pick. [name], MD, FACCDATE OF BIRTH:  04/25/1941   DATE OF CONSULTATION:  DATE OF DISCHARGE:  01/30/2008                                 CONSULTATION   CARDIOVERSION NOTE   [name] is a 70 year old patient who is status post tissue aortic  valve replacement with MAZE procedure.  He has had recurrent  a-fib flutter.  Dr. Ladona Ridgel felt that his flutter was left-sided.  His  surgery was done on April 16, 2007.  We felt at this point the MAZE  procedure was mature, and we were going to give him one more attempt at  cardioversion, to maintain sinus rhythm.  The patient was anesthetized  by [facility] anesthesia.  Using a single biphasic 200-joule shock, the  patient converted from atrial fib flutter at a rate of 90 to a sinus  bradycardia rate of 50.  INR at the time of the procedure was 2.6.   IMPRESSION:  Successful cardioversion on therapeutic Coumadin.   FOLLOWUP:  In the office next week.  Try to wean the patient off  amiodarone.      Noralyn Pick. [name], MD, [facility]  Electronically Signed     PCN/MEDQ  D:  01/30/2008  T:  01/31/2008  Job:  [id number]

## 2011-05-08 NOTE — Assessment & Plan Note (Signed)
[facility]                            CARDIOLOGY OFFICE NOTE   NAME:STEPHENSAshwath, Lasch                    MRN:          [id number]  DATE:09/24/2008                            DOB:          [birth year]    Mr. Radel returns today for followup.  He has an aortic valve  replacement with chronic AFib and previous CABG.  He has been doing  well.  He comes down to [place] to see me.  Interestingly, his wife  recently met my mother at [facility] at the [employer].  Dequante is  doing okay.  He did get his flu shot last week.  His INRs have been  therapeutic finally.  He tends to get some burst capillaries in his left  sclera from time to time, but these seem to be chronic and not  particularly bothersome to him.  He says he has stopped his smoking,  although his wife thinks he cheats from time to time.  He is doing  better.  He used to be a two-pack a day 55 plus year smoker and if he is  only cheating from time to time, I think this is as good as we can  expect.  Otherwise, he is doing well.  He does not have any significant  chest pain.  His wife asked if he could have sex and I said he was  perfectly stable to do this.   REVIEW OF SYSTEMS:  Otherwise negative.   PHYSICAL EXAMINATION:  VITAL SIGNS:  His blood pressure is 122/75,  weight 231, pulse 82 and regular, respiratory rate 40, and afebrile.  HEENT:  Unremarkable.  NECK:  Carotids normal without bruit.  No lymphadenopathy, thyromegaly,  or JVP elevation.  LUNGS:  Depressed diaphragms with no active wheezing; however, he has  significant emphysema.  CARDIAC:  There is an S1 and S2 with a systolic murmur through his  prosthetic valve.  PMI normal.  Sternum is well healed, although he has  a carbuncle in the neck area.  ABDOMEN:  Benign.  Bowel sounds positive.  No AAA.  No tenderness.  No  bruit.  No hepatosplenomegaly or hepatojugular reflux.  No tenderness.  EXTREMITIES:  Distal pulses are  intact.  No edema.  NEUROLOGICAL:  Nonfocal.  SKIN:  Warm and dry.  MUSCULOSKELETAL:  No muscular weakness.   IMPRESSION:  1. Atrial fibrillation, chronic.  Good rate control.  Continue current      dose of Toprol 50 mg a day.  2. Anticoagulation with Coumadin, currently stable.  INR 2.2.  Follow      up in the [facility]madin Clinic in [place].  3. Carbuncle in the neck particularly given his artificial valve      treating aggressively, has had antibiotics per Dr. Phillips Odor.  We      will continue to follow, knows to be seen immediately if he has      pustule in it.  4. Aortic valve replacement functionally sounds normal.  Follow up      echo in a year.  5. History of  coronary artery bypass grafting, not having  chest pain.      Continue aspirin and beta-blocker.   Overall, Akshith is doing well.  I will see him back in 6 months.   MEDICATIONS:  1. Folic acid 1 mg a day.  2. Vitamin B.  3. Coumadin as directed.  4. Toprol 50 mg a day.     Noralyn Pick. [name], MD, [facility]  Electronically Signed    PCN/MedQ  DD: 09/24/2008  DT: 09/25/2008  Job #: [id number]

## 2011-05-08 NOTE — Discharge Summary (Signed)
NAME:  Kyle Love, Kyle Love NO.:  [id number]   MEDICAL RECORD NO.:  [id number]          PATIENT TYPE:  INP   LOCATION:  A316                          FACILITY:  [facility]   PHYSICIAN:  Lonia Blood, M.D.      DATE OF BIRTH:  04-11-1941   DATE OF ADMISSION:  03/13/2009  DATE OF DISCHARGE:  03/24/2010LH                               DISCHARGE SUMMARY   PRIMARY CARE PHYSICIAN:  [name], MD   CARDIOLOGIST:  Noralyn Pick. [name], MD, [facility]   DISCHARGE DIAGNOSES:  1. Rectus sheath hematoma secondary to coughing spells.  2. Supratherapeutic INR on admission.  3. Acute blood loss anemia.  4. Atrial fibrillation.  5. Status post aortic valve repair.  6. Hypertension.  7. Tobacco abuse.   DISCHARGE MEDICATIONS:  1. Toprol-XL 75 mg daily.  2. Cardizem CD 360 mg daily.  3. Aspirin 325 mg daily.  4. Vitamin C 1 tablet daily.  5. Folic acid 1 tablet daily.  6. Prednisone 10 mg daily.   DISPOSITION:  The patient was discharged home to follow up with Dr.  [name].  Also to go to follow at [facility].  He was instructed to stop  taking his Coumadin and amiodarone until he is seen couple of weeks  after discharge.   PROCEDURES PERFORMED DURING THIS ADMISSION:  CT abdomen and pelvis  performed on March 13, 2009, that shows enlarging moderate to large left  rectus sheath hematoma, otherwise stable.  Chest x-ray on March 13, 2009, showed bibasilar segmental atelectasis.   CONSULTATIONS:  [facility], Jesse Sans. Wall, MD, [facility]   BRIEF HISTORY AND PHYSICAL:  Please refer to dictated history and  physical on admission by Dr. Osvaldo Shipper.  In short, however, the  patient is a 70 year old male with history of atrial fibrillation and  status post aortic aneurysm repair and aortic valve replacement.  The  patient has been on Coumadin and his INR has been above 3.  He was  having some cough secondary to bronchitis and was given some Levaquin  also started on some steroids.  The  patient's INR apparently showed out  too high at 3s.  During the coughing spell, he started having pain in  the abdomen.  Hence he came into the hospital where he was found to have  rectus sheath bleed and a large hematoma was seen.  The patient was  subsequently admitted for further management.   HOSPITAL COURSE:  1. Rectus sheath hematoma, mainly conservative measures were employed.      Dr. Leticia Penna, the surgeon was consulted and advised warm compress      and rest.  A followup CT was performed which showed no worsening of      his hematoma.  His hemoglobin did drop some, but otherwise the      patient seemed to have remained stable through the hospitalization.      He was taken off his Coumadin as well as his amiodarone.  2. Atrial fibrillation.  Again, the patient was on amiodarone that was      discontinued, but further changes were made including  adding the      Cardizem to his treatment regimen.  3. Acute blood loss anemia.  The patient's hemoglobin dropped on      admission from an admission level of 14.2 to 9.  It, however,      stabilized between 9 and 10 prior to discharge.  4. Hypertension.  Blood pressure was fully controlled prior to      discharge.  5. Tobacco abuse.  The patient was counseled extensively during      hospitalization.  Otherwise, he seems to be stable and will have an      outpatient followup for his hematoma.      Lonia Blood, M.D.  Electronically Signed     LG/MEDQ  D:  04/20/2009  T:  04/21/2009  Job:  [id number]

## 2011-05-08 NOTE — Assessment & Plan Note (Signed)
[facility]                       [place] CARDIOLOGY OFFICE NOTE   NAME:STEPHENSRodger, Giangregorio                    MRN:          [id number]  DATE:06/18/2007                            DOB:          [birth year]    Mr. Parodi returns today for followup.  He is status post aortic valve  and root replacement.  He is gaining his strength slowly.  Postoperatively, he has had continued atrial fibrillation.  He has  failed cardioversion in the past.  He did have a maze procedure.  He is  on study drug instead of Coumadin as an anticoagulant.   We have in the meantime stopped his amiodarone, but we may try to  cardiovert him 4 months out.   Last time I saw him he was doing well.  He continued to have fluid, we  continued his diuretics.  He had a chest x-ray today that showed his  COPD, the resolution of his right pleural effusion.  He is now off  diuretics.   The patient is in atrial fibrillation.  His rate control in general has  been okay.  Because of his fatigue I have not wanted to increase his  Toprol-XL any higher than 100 b.i.d.  However, I may start him on  Lanoxin.  At rehab there seems to be some concern that his heart rates  are high.  However, I reviewed them and his resting heart rates are 80-  90 which I think is reasonable.  He does not seem to have any problems  with his rehab using the stair-stepper or treadmill.  He seems to have  more problems at night walking the track and I suspect this is secondary  to the heat.  In general, he is improving, however.  His review of  systems otherwise negative.  His weight is 232, blood pressure is  130/70, pulse is 76 and irregular, he is afebrile, respiratory rate is  14.  He looks good.  Affect is appropriate.  HEENT is normal.  Carotids  normal without bruit.  There is no JVP elevation or lymphadenopathy, no  thyromegaly.  Lungs are currently clear with no wheezing.  There is an  S1, S2 with a soft  systolic murmur.  There is no aortic insufficiency  murmur.  PMI is normal.  Sternum is well healed.  Abdomen is benign.  Bowel sounds are positive with no tenderness, no hepatosplenomegaly, no  hepatojugular reflux, and no tenderness or AAA.  Distal pulses are  intact with no edema.  Neurologic is nonfocal.  There is no muscular  weakness.   His EKG shows atrial fibrillation, nonspecific ST-T wave changes.   His medications include Toprol-XL 100 b.i.d., study drug for  anticoagulation, folic acid, vitamins, and an aspirin a day.   IMPRESSION:  1. Stable status post aortic valve and root replacement, healing well.      Valve functioning normal by echocardiogram.  2. Atrial fibrillation.  Continue Toprol-XL b.i.d.  Consider adding      Lanoxin 0.125 a day if indicated in the future for rate control.      Status post maze, currently  not working but may need attempted      final cardioversion in 3-4 months when procedure is mature.  No      need for amiodarone at this time.  3. Fatigue.  Check hemoglobin and hematocrit.  He should be better now      that his Lasix is stopped.  I suspect this is simply a      postoperative phenomenon.  His LV function is normal by      echocardiogram and the valve is functioning normal.  4. Right pleural effusion, resolved.  No need for further diuretics.   I will see the patient back in 6-8 weeks.  Right now I think the main  issue is physical therapy and rehab and further control of his rate for  atrial fibrillation.     Noralyn Pick. [name], MD, [facility]  Electronically Signed    PCN/MedQ  DD: 06/18/2007  DT: 06/19/2007  Job #: [id number]

## 2011-05-10 ENCOUNTER — Telehealth: Payer: Self-pay | Admitting: Cardiovascular Disease

## 2011-05-10 NOTE — Telephone Encounter (Signed)
They where suppose to get a call yesterday and they didn't get the call thye want to know has the information been reviewed

## 2011-05-10 NOTE — Telephone Encounter (Signed)
Pt dtr aware dr [name] not here this week and will call as soon as he reviews Kyle Love

## 2011-05-11 ENCOUNTER — Other Ambulatory Visit: Payer: Self-pay | Admitting: Cardiovascular Disease

## 2011-05-11 NOTE — Cardiovascular Report (Signed)
NAME:  Kyle Love, Kyle Love NO.:  [id number]   MEDICAL RECORD NO.:  [id number]          PATIENT TYPE:  INP   LOCATION:  3708                         FACILITY:  [facility]   PHYSICIAN:  Charlton Haws, M.D.     DATE OF BIRTH:  28-May-1941   DATE OF PROCEDURE:  08/14/2005  DATE OF DISCHARGE:                              CARDIAC CATHETERIZATION   Coronary arteriography.   INDICATIONS:  Chest pain. 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 Laneta Simmers.   Cine catheterization was done from the right femoral artery.   A JL-5 catheter was used to engage the left main. Left main coronary artery  was normal.   Left anterior descending artery had 20% discrete disease in the midvessel.  First diagonal branch was normal.   Circumflex coronary artery is nondominant and normal.   Right coronary artery was dominant. It was normal except for 30% tubular  lesion in the PDA.   We had some difficulty crossing the aortic valve. The patient had a 2-D  echocardiogram right before this study that showed normal LV function with a  peak velocity of less than 3 meters per second and only mild to moderate  aortic stenosis. I did not feel it was imperative to cross the valve. The  patient had an aortic root injection, which indeed showed moderate  dilatation of the ascending aorta with no evidence of dissection and mild  aortic insufficiency.   IMPRESSION:  Chest pain probably not cardiac. No evidence of aortic  dissection. Chronic aneurysm at 4.9 cm being followed by Dr. Laneta Simmers. Mild to  moderate aortic stenosis by echocardiogram. Follow-up echocardiogram in six  months.   The patient will probably be discharged in the morning to be coumadinized as  an outpatient. He will follow with Dr. Daleen Squibb to then plan elective  cardioversion in six weeks.           ______________________________  Charlton Haws, M.D.     PN/MEDQ  D:  08/14/2005  T:  08/14/2005  Job:  [id number]   cc:    Thomas C. Wall, M.D.  [address]  [address]  [place]  [place] [address]   [name], M.D.  [address], [address]  [place]  [place] [address]  Fax: [phone]

## 2011-05-11 NOTE — Op Note (Signed)
NAME:  ODILON, CASS NO.:  [id number]   MEDICAL RECORD NO.:  [id number]          PATIENT TYPE:  AMB   LOCATION:  ENDO                         FACILITY:  [facility]   PHYSICIAN:  Charlton Haws, M.D.     DATE OF BIRTH:  [birth year]   DATE OF PROCEDURE:  10/10/2005  DATE OF DISCHARGE:                                 OPERATIVE REPORT   PROCEDURE:  TEE cardioversion.   INDICATIONS:  Atrial fibrillation. INR was therapeutic for the first week  this Monday at 2.6.   The patient was sedated with 6 milligrams of Versed using digital technique  and Omniplane probe was advanced in the distal esophagus without incident.   Transgastric imaging revealed low normal ejection fraction of 50-55%. There  was mild LVH. Aortic valve was clearly bicuspid. There was mild aortic  stenosis by transthoracic imaging and mild aortic insufficiency. Mitral  valve was normal. There was mild left atrial enlargement. Right-sided  cardiac chambers were normal.   There was moderate smoke in the left atrial cavity. There was no foreign  thrombus and left atrial appendage was small with no thrombus. There was no  atrial septal defect. There was no PFO.   Imaging of the aorta showed moderate aortic root dilatation measuring 4.7  cm. There was no evidence of dissection and moderate PR.   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   The patient was subsequently anesthetized with 250 milligrams sodium  Pentothal with 200 joules synchronized shocks were delivered. The patient  converted to normal sinus rhythm. He tolerated the procedure well.   IMPRESSION:  Successful TEE-guided cardioversion with maintenance of sinus  rhythm. INR at the time of the procedure 2.6.           ______________________________  Charlton Haws, M.D.     PN/MEDQ  D:  10/10/2005  T:  10/10/2005  Job:  [id number]   cc:   Echocardiogram Lab

## 2011-05-11 NOTE — Assessment & Plan Note (Signed)
[facility]                            CARDIOLOGY OFFICE NOTE   Kyle Love, Kyle Love                    MRN:          [id number]  DATE:03/31/2007                            DOB:          Apr 20, 1941    REFERRING PHYSICIAN:  Noralyn Pick. [name], MD, [facility]   Kyle Love is seen as an add-on today.   The patient had several issues to discuss.  Kyle Love is 70 years  old.  He has had atrial fibrillation, ongoing tobacco abuse, an  ascending aortic aneurysm.   The patient had recent CT scan and the ascending aortic aneurysm has  increased in size and was running around 4.9 cm in 2006 and 2007.  He is  now closer to 5.4 cm.  He has been followed by [facility].  We discussed it  at length and I reviewed the patient's CT scan with him.  We both felt  that he needed to have his root replaced.   Unfortunately, Kyle Love continues to smoke.  He has a very long  history of smoking.  He has stopped over the last 2 weeks in  anticipation of possible need for surgery.  I explained to him that even  in the short term if he could stop for 3-4 weeks, this could be  beneficial.  As a matter of fact, I do not think Dr. Laneta Simmers will operate  on him unless he stops smoking for 4 weeks.   The patient has tried Chantix in the past but prefers to stop cold  [place].   He needs follow-up PFTs.  The patient is in chronic atrial fibrillation.  He has failed cardioversion x2 including on Rythmol, and he has also  failed a TEE cardioversion.  The patient also has difficulty regulating  his Coumadin.  He was enrolled in the RELY trial as a different  antithrombin.  He has like this quite a bit.  He has not had to have his  blood checked but every 3 months.  The only complications from his RELY  study drug have been some scleral hematomas, which have been benign.   The patient was catheterized in 2006.  He did not have significant  coronary disease; however, I discussed with  Dr. Laneta Simmers over the phone  that given his continued smoking, we thought that he needed a repeat  heart catheterization.  His last catheterization was uneventful, but we  did have to use a JL-5 catheter due to his dilated aortic root.  The  patient's last echo, I believe, was in 2007.  He had moderate aortic  stenosis with a probable bicuspid aortic valve.   Given the congenitally abnormal valve and propensity for increasing  ascending aortic aneurysm, I think the patient should have a root  replacement.  He will likely need to be on an anticoagulant; therefore,  a Bentall procedure with a prosthetic valve may be in order.  However, I  will let Kamoni talk to Dr. Laneta Simmers regarding a possible homograft versus  a mechanical aortic valve prosthesis.  Given his age, he would likely  have the choice of  either; however, he would appear to be in chronic  atrial fibrillation.  Although the study drug for RELY is probably not  approved for an aortic valve, I think it would be reasonable to keep him  on this medication rather than Coumadin.   REVIEW OF SYSTEMS:  Remarkable for some scleral hematomas but no change  in vision.  He has not had any significant chest pain.  He does get  occasional dyspnea, which is most likely from his COPD.  There has been  no other bleeding diathesis.   A 10-point review of systems otherwise negative.   PAST MEDICAL HISTORY:  Otherwise remarkable for a polypectomy and  hemorrhoidectomy in 1997.  He has some arthritis in his knees.   He is allergic to PENICILLIN.   CURRENT MEDICATIONS:  The RELY study drug, folic acid, and Toprol 100 mg  in the morning and 50 mg at night.  He also takes Advair and a baby  aspirin a day.   He is retired from [employer].  He likes to golf and uses the  cart.  His wife is with him today.  They are happily married.  He is  struggling with the nicotine issues in regard to quitting smoking.   PHYSICAL EXAMINATION:  VITAL SIGNS:   His exam is remarkable for a blood  pressure of 120/70.  He is in atrial fibrillation at a rate of 80.  HEENT:  Normal.  NECK:  Carotids have minimal parvus and tardus.  LUNGS:  Clear with hyperexpansion of his lungs.  CARDIAC:  There is an S1, S2, with an AS murmur.  ABDOMEN:  Benign.  EXTREMITIES:  Lower extremities with intact pulses, no edema.  NEUROLOGIC:  Nonfocal.  SKIN:  Warm and dry.   His EKG is essentially normal.   Chest x-ray done today shows a tortuous aorta with mild cardiomegaly and  no lung infiltrates.   IMPRESSION:  1. Ascending aortic aneurysm, increased in size and now close to 5.4      cm in association with bicuspid aortic valve.  The patient will see      Dr. Evelene Croon for [facility] procedure.  2. Chronic atrial fibrillation on RELY study drug.  Probably try to      see if we can use this postoperatively with his aortic valve.  The      patient really does not want to go back on Coumadin.  It was      difficult to regulate.  He seems to feel much better on this      antithrombin.  His rate is well-controlled.  We will continue his      beta blocker.  3. Continued smoking.  Has quit for 2 weeks.  Continue to monitor.      The patient can have Chantix or Wellbutrin if he would like.  4. Coronary risk factors of hypertension and smoking and need for      aortic valve surgery.  Repeat heart catheterization to be done next      week to rule out coronary disease.  5. At the time we will do an aortic root injection to look at the      great vessels as well.  6. Hypertension, well-controlled currently on beta blocker alone.   I actually went over his CT scan and echocardiogram with him.  All of  the patient's questions were answered.  He understands the need for open  heart surgery to fix his ascending aorta  and the need to define his coronary anatomy prior to such a big operation.  Further recommendations  will be based on the results of his heart  catheterization.     Noralyn Pick. [name], MD, [facility]  Electronically Signed    PCN/MedQ  DD: 04/02/2007  DT: 04/02/2007  Job #: [id number]

## 2011-05-11 NOTE — Discharge Summary (Signed)
NAME:  Kyle Love, Kyle Love NO.:  [id number]   MEDICAL RECORD NO.:  [id number]          PATIENT TYPE:  INP   LOCATION:  3708                         FACILITY:  [facility]   PHYSICIAN:  Charlton Haws, M.D.     DATE OF BIRTH:  1941-06-28   DATE OF ADMISSION:  08/13/2005  DATE OF DISCHARGE:  08/15/2005                                 DISCHARGE SUMMARY   PRIMARY CARE PHYSICIAN:  [name], M.D.   PRIMARY CARE CARDIOLOGIST:  Charlton Haws, M.D.   PRINCIPAL DIAGNOSIS:  New onset atrial fibrillation.   OTHER DIAGNOSES:  1.  Bilateral neck and shoulder pain.  2.  Ongoing tobacco abuse, one pack and a half per day x50 years.  3.  History of ascending aortic aneurysm followed by Dr. Laneta Simmers.  Last      measured 2006 at 4.9 cm.  4.  History of colon polyps status post  polypectomy approximately 1997.  5.  Status post hemorrhoidectomy approximately in 1997.   ALLERGIES:  PENICILLIN CAUSES RASH.   PROCEDURE:  Left heart cardiac catheterization.   HISTORY OF PRESENT ILLNESS:  This 70 year old white male with history of  tobacco abuse and ascending aortic aneurysm followed by Dr. Laneta Simmers who was  in his usual state of health until the evening of August 12, 2005 when after  working on his mobile home for a good portion of the day the day prior and  then playing 18 holes of golf earlier that morning, in the afternoon he  developed 8/10 right neck and shoulder aching which he likened to a  toothache, without associated symptoms, somewhat relieved with Darvocet and  Skelaxin as well as massage down to 3-4/10.  The symptoms were present  throughout the night and also on the morning of admission.  With additional  Darvocet and Skelaxin his symptoms decreased to 1-2/10.  He was seen by his  primary care physician, Dr. Nobie Love, and ECG was performed showing atrial  fibrillation.  Following discussion with Dr. Valera Love it was determined  that the patient should be admitted for further  evaluation and he presented  to [facility].   HOSPITAL COURSE:  On arrival the patient was noted to be in atrial  fibrillation with a rate of 104 beats per minute.  He was initiated on  heparin and beta blocker therapy.  Given his history of neck and shoulder  discomfort which was somewhat atypical for angina, he did undergo left heart  cardiac catheterization on August 14, 2005 which showed insignificant  coronary artery disease and moderately dilated aortic root.  The patient has  been doing well.  From an Afib standpoint, has been relatively asymptomatic.  His rate has been relatively well-controlled on beta blocker therapy in the  70's to 80's.  He is being discharged home today with plans for followup in  the Coumadin Clinic in five days.  He will followup with Dr. Eden Love in two  weeks with plans for elective cardioversion in six weeks.   DISCHARGE LABORATORY:  Hemoglobin 14.7, hematocrit 42.8, WBC 9.2, platelets  231, MCV 97.9.  Sodium 139, potassium  4.1, chloride 105, CO2 27, BUN 11,  creatinine 1.9, glucose 88, PT 13.7, INR 1.0, total bilirubin 1.0, alkaline  phosphatase 81, AST 22, ALT 17, albumin 3.8.  Cardiac enzymes negative x3.  Total cholesterol 150, triglycerides 63, HDL 49, LDL 88, calcium 8.8.  TSH  0.885.  Free T4 6.2.   DISPOSITION:  The patient is being discharged home in good condition.   FOLLOW UP APPOINTMENTS:  The patient will follow up in the Coumadin Clinic  early next week, and by Cardiology (Dr. Eden Love) in two weeks.  The plan  would be to continue anticoagulation and probable DCCV in six weeks.   DISCHARGE MEDICATIONS:  1.  Toprol XL 50 mg daily.  2.  Coumadin 5 mg q.h.s.  3.  Aspirin 81 mg daily.   OUTSTANDING LABORATORY STUDIES:  None.   DURATION OF DISCHARGE ENCOUNTER:  Forty minutes.      Ok Anis, NP    ______________________________  Charlton Haws, M.D.    CRB/MEDQ  D:  08/15/2005  T:  08/15/2005  Job:  [id number]

## 2011-05-11 NOTE — Op Note (Signed)
NAME:  Kyle Love, Kyle Love             ACCOUNT NO.:  [id number]   MEDICAL RECORD NO.:  [id number]          PATIENT TYPE:  AMB   LOCATION:  DAY                           FACILITY:  [facility]   PHYSICIAN:  R. Roetta Sessions, M.D. DATE OF BIRTH:  July 12, 1941   DATE OF PROCEDURE:  07/05/2006  DATE OF DISCHARGE:                                 OPERATIVE REPORT   PROCEDURE:  Colonoscopy with multiple snare polypectomy, APC ablation of  polyps.   INDICATION FOR PROCEDURE:  The patient is a 70 year old gentleman with a  history of colonic adenomatous polyps.  Last colonoscopy was 2002.  He is  here for surveillance.  He does not have any lower GI tract symptoms.  Aspirin was held yesterday.  He has a research drug, dabigapram, was held  last evening.  Please see documentation in the medical record.   PROCEDURE NOTE:  O2 saturation, blood pressure, pulse and respirations were  monitored throughout the entire procedure.   CONSCIOUS SEDATION:  Versed 4 mg IV, Demerol 100 mg IV in divided doses.   INSTRUMENT USED:  Olympus video chip system.   FINDINGS:  Digital rectal exam revealed no abnormalities.  Endoscopic  findings:  Prep was adequate.   Rectum:  Examination of the rectal mucosa including a retroflexed view of  the anal verge revealed internal hemorrhoids only.   Colon:  The colonic mucosa was surveyed from the rectosigmoid junction  through the left, transverse and right colon to the area of the appendiceal  orifice, ileocecal valve and cecum.  These structures were well-seen and  photographed for the record.  From this level the scope was slowly withdrawn  and all previously-mentioned mucosal surfaces were again well-seen.  The  following abnormalities were noted:   1.  The patient had scattered sigmoid diverticula.  2.  The patient had clustering multiple polyps in the ascending colon all      the way to the cecum.  They were multiple.  Multiple 3 mm diminutive      polyps were either  ablated with the tip of the snare unit or removed via      cold snare.  There was a larger 1 cm polyp in the ascending colon which      was removed with hot snare technique.  There was a sprawling sessile      carpet-type polyp spanning going behind the fold approximately 1.5 x 3      cm in dimensions.  It was removed with hot snare polypectomy with saline-      assisted technique, approximately 5 mL of normal saline was injected      submucosally prior to any attempts at removal.  Most of this lesion was      removed via piecemeal polypectomy; however, there were some difficult      spots at the periphery and behind the fold, for which I subsequently      applied APC and painted these areas without difficulty.  Multiple polyp      specimens were submitted to the pathologist.   The patient tolerated the procedure well and  was reacted in endoscopy.   IMPRESSION:  1.  Internal hemorrhoids, otherwise normal rectum.  2.  Left-sided diverticula.  3.  Multiple polyps dealt with as described above, right colon.   RECOMMENDATIONS:  1.  I would like the patient not to be on any blood thinner or      anticoagulation therapy for the next 5 days.  This would include      avoiding aspirin and dabigapram.  Will check with cardiology and advise      them of my recommendations.  2.  Follow up on pathology.  3.  The carpet polyp in the right colon may likely need early follow-up      pending pathology.      Jonathon Bellows, M.D.  Electronically Signed     RMR/MEDQ  D:  07/05/2006  T:  07/05/2006  Job:  [id number]   cc:   Corrie Mckusick, M.D.  Fax: [phone]

## 2011-05-11 NOTE — Procedures (Signed)
NAME:  Kyle Love, Kyle Love NO.:  [id number]   MEDICAL RECORD NO.:  [id number]          PATIENT TYPE:  OUT   LOCATION:  SLEEP CENTER                 FACILITY:  [facility]   PHYSICIAN:  Marcelyn Bruins, M.D. [facility] DATE OF BIRTH:  04-11-1941   DATE OF STUDY:  03/01/2006                              NOCTURNAL POLYSOMNOGRAM   REFERRING PHYSICIAN:  Dr. Danice Goltz.   DATE OF STUDY:  February 28, 2006.   INDICATION FOR STUDY:  Hypersomnia with sleep apnea.   EPWORTH SCORE:  9.   SLEEP ARCHITECTURE:  The patient had total sleep time of 402 minutes with  adequate REM and very little slow wave sleep. Sleep onset latency was normal  at 9 minutes and REM onset was normal as well. Sleep efficiency was fairly  good at 93%.   RESPIRATORY DATA:  The patient was found to have 46 hypopneas and no apneas  for a respiratory disturbance index of 7 events per hour and O2 desaturation  as low as 87%. The events were not positional but mild snoring was noted  throughout.   OXYGEN DATA:  The patient had O2 desaturation as low as 87% with his  obstructive events.   CARDIAC DATA:  The patient was found to have atrial fibrillation with a  varying ventricular response. There was no significant heart rates noted  above 100.   MOVEMENT/PARASOMNIA:  The patient was found to have 378 leg jerks with 6 per  hour resulting in arousal or awakening.   IMPRESSION/RECOMMENDATIONS:  1.  Very mild obstructive sleep apnea/hypopnea syndrome with a respiratory      disturbance index of 7 events per hour and O2 desaturation as low as      87%. Treatment for this degree of sleep apnea may include weight loss      alone if applicable, upper airway surgery, oral appliance, and also C-      PAP.  2.  Large numbers of leg jerks with very significant sleep disruption. It is      unclear whether this is the patient's      primary sleep disorder or whether it is a second primary disorder in      addition to his mild  sleep apnea that is disrupting his sleep. Clinical      correlation is suggested.           ______________________________  Marcelyn Bruins, M.D. [facility]  Diplomate, American Board of Sleep  Medicine     KC/MEDQ  D:  03/12/2006 12:55:55  T:  03/13/2006 01:26:31  Job:  [id number]

## 2011-05-11 NOTE — Assessment & Plan Note (Signed)
[facility]                              CARDIOLOGY OFFICE NOTE   NAME:STEPHENSAlvino, [name]                    MRN:          [id number]  DATE:09/02/2006                            DOB:          08-24-1941    FOLLOWUP:  He has had chronic atrial fibrillation.  He has had  cardioversion.  He is tolerating it well.  He is on anticoagulation.  He  continues to smoke.  We have tried to get him to cut back on this but this  is a 50+ year habit and it is difficult.  I told him to try to cut back one  cigarette a week for the time being.   He was not interested in Chantix.   The patient has had a catheterization last year without critical coronary  disease.  He has COPD.  He has not been using his Advair.  I told him about  the benefits of both the steroid and long-acting beta-agonist, fortunately  he has been doing well.  He had a 3-week trip out to [place] which was a  lot of fun.  His wife was with him today.  She continued to harp on his  smoking and wanted to make sure that  he told me the truth about his  activities.   PHYSICAL EXAMINATION:  VITAL SIGNS:  Atrial fibrillation at a rate of 80.  LUNGS:  Have some inspiratory crackles in the left upper lobe.  There was  some previous pleural thickening there.  HEART:  There is no __________ heart tones.  ABDOMEN:  Benign.  EXTREMITIES:  No edema.   IMPRESSION:  Stable with __________ atrial fibrillation.  Good rate control.  On Toprol 100 in the morning, 50 at night.  I believe he is on the Rely  study drug.  For anticoagulation he will have followup pulmonary function  tests in a year. He has a 4 x 8 x 5 cm aortic aneurysm.  We will need to  follow-up computerized tomography in February.   This will be arranged at our office.  I told Kyle Love I would be happy  to see him in [place] since I will be going up there once a week and this  is more convenient for him.  His wife also sees Dr.  Dietrich Pates up there.                               Noralyn Pick. [name], MD, [facility]    PCN/MedQ  DD:  09/02/2006  DT:  09/02/2006  Job #:  [id number]

## 2011-05-11 NOTE — H&P (Signed)
NAME:  Kyle Love, Kyle Love NO.:  [id number]   MEDICAL RECORD NO.:  [id number]          PATIENT TYPE:  INP   LOCATION:  3708                         FACILITY:  [facility]   PHYSICIAN:  Charlton Haws, M.D.     DATE OF BIRTH:  Dec 09, 1941   DATE OF ADMISSION:  08/13/2005  DATE OF DISCHARGE:                                HISTORY & PHYSICAL   PRIMARY CARE PHYSICIAN:  [name], M.D.   PRIMARY CARDIOLOGIST:  Jesse Sans. Wall, M.D.   PATIENT PROFILE:  A 70 year old white male with prior history of tobacco  abuse and ascending aortic aneurysm who presents with bilateral shoulder  discomfort and new onset atrial fibrillation.   PROBLEM LIST:  1.  New onset atrial fibrillation.  2.  Bilateral neck and shoulder pain.  3.  Ongoing tobacco abuse, 1-1/2 packs per day x50 years.  4.  History of ascending aortic aneurysm followed by Dr. Laneta Simmers, last      measured July 2006 at 4.9 cm per patient.  5.  History of colon polyps status post polypectomy approximately 1997.  6.  Status post hemorrhoidectomy approximately 1997.   HISTORY OF PRESENT ILLNESS:  A 70 year old white male with history of  tobacco abuse and ascending aortic aneurysm last measured at 4.9 cm in July  2006 per patient, followed by Dr. Laneta Simmers with CT scans every 6 months.  On  the evening of August 20 after working on his mobile home for a good portion  of the day on Saturday and then playing 18 holes of golf yesterday morning  and afternoon, he developed 8/10 right neck and shoulder aching which he  likened to a toothache without associated symptoms, somewhat relieved with  Darvocet and Skelaxin as well as massage down to 3 to 4/10.  His symptoms  were present throughout the night and also this morning.  With additional  Darvocet and Skelaxin, his symptoms decreased to 1 to 2/10.  He went to see  his primary care physician, Dr. [name], today and ECG was performed  in the office showing atrial fibrillation.   The case was discussed with Dr.  [name] who previously saw the patient several years ago for a heart  murmur, and decision was made to admit him for further evaluation.  Currently he reports 1 to 2/10 bilateral shoulder discomfort and denies any  complications of shortness of breath.   ALLERGIES:  PENICILLIN causes rash.   HOME MEDICATIONS:  1.  Aspirin 81 mg daily.  2.  Vitamin 5000 mg daily.  3.  Vitamin B12 daily.  4.  Vitamin C 1000 mg daily.  5.  Vitamin E 1000 mg daily.  6.  Folic acid 400 mcg daily.   FAMILY HISTORY:  Mother died of cancer at age 93.  Father died of MI at age  63.  He has one brother who had a permanent pacemaker but no history of MI  or heart disease in his siblings.  No diabetes or CVA.  Positive history of  cancer in siblings.   SOCIAL HISTORY:  The patient lives in [place] with his  wife.  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  He has been smoking a pack and one-half a day for approximately 50 years.  He denies any alcohol or drugs, and he golfs several times per week without  limitation (he rides in a cart).   REVIEW OF SYSTEMS:  No fevers, chills, sweats, rashes, or palpitations,  shortness of breath, nocturia, weakness, numbness, nausea, vomiting,  diarrhea, or history of diabetes.  He has had bilateral shoulder discomfort  as per HPI.   PHYSICAL EXAMINATION:  VITAL SIGNS:  Temperature 97.5, heart rate 104,  respirations 18, blood pressure 122/85.  GENERAL:  White male in no acute distress.  Awake, alert, and oriented x3.  NECK:  Normal carotid upstrokes.  No bruits or JVD.  LUNGS:  Respirations unlabored.  Clear to auscultation.  CARDIAC:  Irregularly irregular.  S1, S2.  There is a 2/6 systolic murmur at  left sternal border.  ABDOMEN:  Round, soft, nontender, nondistended.  Bowel sounds present.  EXTREMITIES:  Lower extremities warm and dry.  Without cyanosis, clubbing,  or edema.  Dorsalis  pedis and posterior tibial pulses 2+ and equal  bilaterally.  No femoral bruits noted.   Chest x-ray is pending.   EKG is pending.   Telemetry shows atrial fibrillation with rate between 80 and 90.   All labs are pending.   ASSESSMENT:  1.  Atrial fibrillation, currently asymptomatic with exception of bilateral      shoulder pain which may be unrelated. Will initiate beta blocker,      Coumadin, heparin.  Will check LFTs, BMET, magnesium, as well as      echocardiogram.  2.  Neck and shoulder pain, atypical for angina.  Cycle cardiac enzymes,      follow ECG.  If enzymes are negative, consider outpatient function      study.  Will also check chest x-ray.  3.  History of ascending aortic aneurysm.  Recently had CT in July with Dr.      Laneta Simmers.  Per patient, size was 4.9 cm.  Will order chest x-ray.  If      there is any suggestion of aortic abnormality, have low threshold to CT      him.  4.  Lipid status currently unknown.  Check lipids and LFTs.  5.  Tobacco abuse.  Smoking cessation advised.  Patient generally a little      bit stubborn about this and does not appear to be too interested in      quitting.      Ok Anis, NP    ______________________________  Charlton Haws, M.D.    CRB/MEDQ  D:  08/13/2005  T:  08/13/2005  Job:  [id number]   cc:   [name], M.D.  [address]  [place]  [place] [address]  Fax: [phone]   Jesse Sans. Wall, M.D.  [address]  [address]  [place]  [place] [address]

## 2011-05-11 NOTE — Op Note (Signed)
NAME:  Kyle Love, KETTLES NO.:  [id number]   MEDICAL RECORD NO.:  [id number]          PATIENT TYPE:  INP   LOCATION:  2311                         FACILITY:  [facility]   PHYSICIAN:  Evelene Croon, M.D.     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   OPERATIVE PROCEDURE:  Median sternotomy, extracorporeal circulation,  right and left sided radiofrequency Cox-Maze procedure, replacement of  the aortic valve and ascending aortic aneurysm using a 27 mm Medtronic  freestyle aortic root heart valve prosthesis and composite Dacron tube  graft, reimplantation of the left and right coronary artery.   ATTENDING SURGEON:  Evelene Croon, M.D.   ASSISTANT:  Rowe Clack, P.A.-C.   ANESTHESIA:  General endotracheal anesthesia.   CLINICAL HISTORY:  This patient is a 70 year old gentleman who I have  been following since January 23, 2005, with an ascending aortic  aneurysm.  This has been increasing in size slowly.  Since it was slowly  increasing in size and was less than 5.5 cm, it was decided to continue  following it for a while longer.  He has also followed by Dr. Charlton Haws for atrial fibrillation and a bicuspid aortic valve with some  degree of aortic stenosis.  He subsequently underwent a CT scan of the  chest on February 14, 2007, that showed the maximum diameter of his  ascending aorta had enlarged to 5 cm by 5.5 cm.  There was some  calcification of the aortic valve.  The aneurysm appeared to taper down  to about 3.5 cm in the proximal aortic arch and 2.7 cm in the distal  arch with normal caliber of the descending thoracic aorta.  I felt that,  given his increasing size of the aneurysm that has now reached 5.5 cm,  it  was time to proceed with surgical repair.  I discussed smoking  cessation with him and told him that he would need to stop smoking for  about one month prior to surgery to decrease his perioperative wrist.  He did this successfully.  I reviewed his old transesophageal  echocardiogram which has shown a bicuspid aortic valve and mild to  moderate aortic stenosis.  He underwent repeat cardiac catheterization  by Dr. [name] which showed normal coronary arteries.  After review all  this, I felt that proceeding with repair of his aortic valve and  ascending aneurysm was the best treatment to prevent further  complications of this.   He did have a history of chronic atrial fibrillation requiring  anticoagulation.  He has had attempts at cardioversion in the past that  have been unsuccessful in holding.  I felt a Maze procedure may increase  his chances of maintaining sinus rhythm.  He has been treated with  Coumadin in the past but had a difficult time with this with widely  fluctuating INR levels requiring twice  weekly testing for a while.  He  was eventually switched to a study drug which he has been more stable  on.  I discussed the operative procedure of replacement of his aortic  valve and ascending aorta including alternatives for valve replacement  prostheses.  We discussed the pros and cons of a mechanical valve  conduit versus a porcine route.  Given his difficulty with  anticoagulation in the past and his age, I felt that a porcine route  probably would be the best choice for him.  I did discuss the small  chance of the aortic prosthesis deteriorating over his life time and  requiring further intervention.  We discussed the benefits and risks of  surgery including but not limited to bleeding, blood transfusion,  infection, stroke, myocardial infarction, heart block requiring  permanent pacemaker, and death.  He understood and agreed to proceed.   OPERATIVE PROCEDURE:  The patient was  taken to the operating room and  placed on the table in the supine position.  After the induction of  general endotracheal anesthesia, a Foley catheter was placed in the  bladder using sterile technique.  Then, the chest, abdomen, and both  lower extremities were prepped and draped in the usual sterile manner.  A transesophageal echocardiogram was performed by anesthesiology.  This  showed a bicuspid aortic valve with significant calcification and  moderate stenosis.  Left ventricular function was well preserved.  There  was no mitral regurgitation.  There was some mild aortic insufficiency.  Then, the chest was opened through a median sternotomy incision and the  pericardium opened in the midline.  Examination of the heart showed good  ventricular contractility.  The ascending aorta was aneurysmal from the  area of the aortic root up to the aortic arch.  The aorta had minimal  plaque in it distally.   Then, the patient was heparinized and when an adequate activated  clotting time was achieved, the distal ascending aorta was cannulated  using a 22 French aortic cannula for arterial inflow.  Venous outflow  was achieved using bicaval venous cannulation with a 28 French metal  tipped right angle cannula placed through a pursestring suture in the  superior vena cava and a 36 [place] plastic right angle cannula placed  through a pursestring suture in the low right atrium.  An antegrade  cardioplegia and vent cannula was inserted in the aortic root.   The patient was placed on cardiopulmonary bypass.  The superior inferior  vena cavae were encircled with tapes.  The patient was then cooled to 18  degrees centigrade.  During the cooling process, the Maze procedure was  performed.  The aorta was then crossclamped and 1000 mL of cold blood  antegrade cardioplegia was administered in the aortic root with quick  arrest the heart.  Systemic hypothermia to 18 degrees centigrade was begun and topical  hypothermia with iced saline was used.  A temperature  probe was placed on the septum and an insulating pad in the pericardium.   Then, attention was turned to the Maze procedure.  The superior and  inferior vena caval tapes were tightened.  Then, the heart was retracted  towards the right and the left pulmonary veins were encircled with a  vessel loop.  Then, the bipolar radiofrequency device was used to create  a transmural lesion around the left pulmonary veins.  Then, another  bipolar lesion was created at the base of the left atrial appendage.  Then, the left  atrium was opened through a vertical incision in the  intra-atrial groove.  The right pulmonary vein lesion was completed  using the bipolar radiofrequency device from above and below to encircle  the right pulmonary veins.  Then, a handheld mitral retractor was placed  to perform the internal left atrial lesions.  Exposure was somewhat  difficult due to his very deep chest.  The unipolar device was then used  to create a lesion between the right and left pulmonary veins.  Another  unipolar lesion was created up to the left atrial appendage from the  left pulmonary vein.  Then, a unipolar lesion was created from the left  pulmonary vein down to the mitral annulus at the area of P3.  Another  lesion was created across the coronary sinus.  This completed the left  atrial lesions.  I did not ligate the left atrial appendage because this  was very difficult to expose completely and I did not feel that I could  securely suture it.   Then, attention was turned to the right atrial lesions.  A bipolar  lesion was created across the base of the right atrial appendage.  The  right atrial appendage was opened and a bipolar lesion was performed  obliquely across the lateral free wall.  Then, an oblique incision was  made in the right atrium from the intra-atrial septum at the level of  the inferior pulmonary vein up to the atrioventricular  groove.  Care was  taken to maintain a safe distance from the IVC cannula.  Examination of  the internal aspect of the right atrium showed no anatomical  abnormalities.  Then, the bipolar device was used to create a lesion  across the intra-atrial septum to the posterior aspect of the coronary  sinus.  The unipolar device was then used to continue this lesion up  around the posterior aspect of the coronary sinus up to the tricuspid  annulus.  Then, a unipolar lesion was created from the end of the  oblique incision at the atrioventricular groove down to the tricuspid  annulus.  A lesion was also created from the tricuspid annulus down to  the inferior vena cava cannula.  The final lesion, using the unipolar  device, was created from the right atrial appendage down to the  tricuspid annulus.  The bipolar device was then again used to create a  lesion along the intra-atrial septum superiorly to the superior vena cava and then inferiorly down to the inferior vena cava.  This completed  the right and left sided Maze lesions.  The right atrium was then closed  using two layers of continuous 4-0 Prolene suture.  The left atrium was  not closed at this time and a vent sucker was placed in the left atrium.  A moist gauze was placed over the atriotomy to prevent any debris from  entering the atrium.   Then, attention was turned to the aneurysm repair.  The patient had  cooled to a rectal and bladder temperature of 18 degrees centigrade by  this time.  This took about one hour of cooling.  Then, the head was  placed in the Trendelenburg position.  Circulatory arrest was begun and  the blood volume emptied back into the pump.  The superior vena cava  cannula was removed and a retrograde cardioplegia cannula was inserted  through this pursestring suture up into the SVC to give retrograde cold  blood cerebroplegia which was run at about 300 mL  per minute.  Then, the  aortic cannula was removed.  The  aorta was transected just proximal to  the innominate artery across the under surface of the arch.  Then, the  distal anastomosis between the Hemashield graft and the aortic arch was  performed.  We used a Hemashield platinum woven double velour vascular  graft which was a 30 mm graft with a 10 mm sidearm.  This had lot number  [id number].  This was cut to the appropriate length and sutured to the  under surface of the aortic arch in an end-to-end manner using  continuous 3-0 Prolene suture with a felt strip to reinforce the  anastomosis.  Then, the arterial line was connected to the sidearm of  the vascular graft using a 24 French arterial cannula for inflow.  The  circulation was begun and de-airing was performed and a crossclamp  placed across the vascular graft just proximal to the sidearm.  Full  cardiopulmonary bypass was reinstituted.  Total circulatory arrest time  was 22 minutes.  It should be noted during the period of circulatory  arrest that the head was packed in ice and appropriate pharmacological  drugs were given by anesthesiology.   Then, the ascending aortic aneurysm was resected.  The aorta was opened  down into the aortic root and the right and left coronary ostia were  identified.  Further doses of cardioplegia were given directly into the  coronary ostia about every 20 minutes to maintain myocardial temperature  less than 10 degrees centigrade.  Then, the right and left coronary  ostia were excised as buttons.  Stay sutures were placed through the  edges to maintain proper orientation.  Examination of the native valve  showed that it was a functionally bicuspid valve with three cusps.  There was complete fusion between the left and right cusp.  The native  valve was excised.  There was some calcium in the annulus and this was  removed with rongeurs.  Care was taken to remove all particulate debris  and the ventricle was irrigated with iced saline solution to aid  this. The annulus was sized and a 27 mm Medtronic freestyle aortic root heart  valve prosthesis was chosen.  This had model number 995, serial number  [id number].  While this was being prepared, a series of pledgeted 2-0  Ethibond horizontal mattress sutures were placed around the annulus with  the pledgets in the subannular position.  This required 16 sutures.  A  pericardial strip was used to buttress the proximal anastomosis.  Then,  the porcine root was brought up onto the field.  It was positioned so  that the porcine left coronary sinus was adjacent to the patient's right  coronary sinus and the porcine right coronary sinus was in the non-  coronary sinus position.  The stump of the porcine right coronary artery  was reinforced with a 3-0 Prolene suture.  Then, the Ethibond sutures  were placed through the sewing ring on the porcine root.  The root was  lowered into place and the sutures tied sequentially.  The porcine root  seated nicely.  The proximal anastomosis was lightly covered with  BioGlue for hemostasis.  Then, the position of the left coronary  anastomosis was marked and an opening made in the porcine aortic sinus  using a 4.5 mm punch.  The left coronary was anastomosed to the porcine  root in an end-to-side manner using continuous 5-0 Prolene suture.  This  anastomosis  looked quite good.  Then, the stump of the porcine left  coronary artery was amputated and the patient's right coronary button  was anastomosed to this opening in an end-to-side manner using  continuous 5-0 Prolene suture.  This anastomosis also was quite good.   Then, the Dacron graft was cut the appropriate length and was  anastomosed to the end of the porcine root in an end-to-end manner using  continuous 3-0 Prolene suture.  This anastomosis was lightly covered  with BioGlue for hemostasis.  The patient was rewarmed to 37 degrees  centigrade.  We had begun to rewarm to 28 degrees centigrade as soon  as  the period of circulatory arrest was completed and then, after  completing the graft to porcine root anastomosis, the patient was fully  rewarmed to 37 degrees centigrade.  The left side of the heart was then  de-aired.  The left atriotomy incision was closed in two layers using  continuous 3-0 Prolene suture.  Then, the head was placed in a  Trendelenburg position and the crossclamp was removed with a time of 254  minutes.  With the crossclamp removed, the patient was in a very slow  rhythm and the heart began to fill and, therefore, a left ventricular  vent was placed through the right superior pulmonary vein.  Two  temporary right ventricular and right atrial pacing wires were placed  and brought through the skin.  With time, the heart improved and began  ejecting.  The anastomoses all appeared hemostatic.   When the patient had rewarmed to 37 degrees centigrade, he was weaned  from cardiopulmonary bypass on low dose dopamine.  Total bypass time was 306 minutes.  Cardiac function appeared good.  Transesophageal  echocardiogram showed a normal functioning porcine aortic valve.  There  was no mitral regurgitation.  Left and right ventricular function  appeared well preserved.  Then protamine was given and the venous and  aortic cannulae were removed without difficulty.  The sidearm of the  aortic graft was clamped and oversewn with two layers of 3-0 Prolene  continuous suture.  Hemostasis was achieved without difficulty.  The  patient was given 10 units of platelets.  Three chest tubes were placed  with two in the posterior pericardium, one in the anterior mediastinum,  and one in the right pleural space which had been opened.  This was  closed with double #6 stainless steel wires.  The fascia was closed with  continuous #1 Vicryl suture.  The subcutaneous tissue was closed with  continuous 2-0 Vicryl and the skin with 3-0 Vicryl subcuticular closure.  The sponge, needle, and  instrument counts were correct according to the  scrub nurse.  A dry sterile dressing was applied over the incisions and  around the chest tubes which were hooked to Pleur-Evac suction.  The  patient remained hemodynamically stable and was transferred to the SICU  in guarded but stable condition.      Evelene Croon, M.D.  Electronically Signed     BB/MEDQ  D:  04/16/2007  T:  04/16/2007  Job:  [id number]   cc:   Noralyn Pick. [name], MD, [facility]

## 2011-05-11 NOTE — Cardiovascular Report (Signed)
NAME:  Kyle Love, Kyle Love NO.:  [id number]   MEDICAL RECORD NO.:  [id number]          PATIENT TYPE:  OIB   LOCATION:  2899                         FACILITY:  [facility]   PHYSICIAN:  Charlton Haws, M.D.     DATE OF BIRTH:  Sep 10, 1941   DATE OF PROCEDURE:  04/10/2006  DATE OF DISCHARGE:  04/10/2006                              CARDIAC CATHETERIZATION   [name] is a 70 year old patient who has had failed cardioversion  before.  He has been on Rythmol for about eight weeks.  Repeat cardioversion  was attempted.  The patient was anesthetized with 125 mg sodium Pentothal.  Three 200 joule biphasic shocks were delivered.  Despite very long pauses in  a junctional escape he had never had any P-waves and resumed in his chronic  atrial fibrillation.   IMPRESSION:  Failed cardioversion both on no medication and on  antiarrhythmic Rythmol.   Patient will be anticoagulated and rate controlled with no further attempts  at cardioversion.   He will be seen back in the office for possibility of enrollment in the RELY  trial in regards to a Coumadin alternative.  The patient tolerated the  procedure well.  His INR at the time was 2.2.           ______________________________  Charlton Haws, M.D.     PN/MEDQ  D:  04/10/2006  T:  04/11/2006  Job:  [id number]

## 2011-05-11 NOTE — Cardiovascular Report (Signed)
NAME:  Kyle Love, Kyle Love NO.:  [id number]   MEDICAL RECORD NO.:  [id number]          PATIENT TYPE:  OIB   LOCATION:  1962                         FACILITY:  [facility]   PHYSICIAN:  Noralyn Pick. 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  Cine catheterization done with 5-French catheters from right  femoral artery, JL-5 catheter was used to engage the left main and ART  catheterization was used to engage the right coronary artery.  The  patient was on to RELY study drug; however, his PT and PTT were normal.  At the end of the case we did Angio-Seal the arteriotomy site to make  sure we had hemostasis and also used a V/pad.  The patient had some  minor oozing at the end of the case.  He will be observed for 3-4 hours  given the RELY study drug anticoagulant properties.  But again, his  PT/PTT were normal.   Left main coronary artery had a 30% tubular stenosis in the mid and  distal portion.  Left anterior descending artery had 20-30% multiple  lesions in the proximal mid portion.  First and second diagonal branches  were normal.   Circumflex coronary was nondominant and normal.   The right coronary artery was dominant.  It had 30% tubular disease  proximally and 20% distal disease.   We did spend a few minutes trying to cross the valve.  It was calcified.  We could not cross the valve with the small catheters that we were  using.   LAO aortography showed severe aortic root dilatation starting proximally  2 cm above the valve.  There appeared to be moderate aortic  insufficiency.   At the end of the case, the Angio-Seal arteriotomy device was deployed.  We appeared to have good hemostasis with minor skin oozing.   Therefore,  the pad was placed   IMPRESSION:  The patient will be referred to Dr. Laneta Simmers for [facility]  procedure.  He has moderate AS and moderate AI by echo.  He has good LV  function.  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   Tolerated procedure well.      Noralyn Pick. [name], MD, [facility]  Electronically Signed     PCN/MEDQ  D:  04/03/2007  T:  04/03/2007  Job:  [id number]

## 2011-05-12 LAB — CBC WITH DIFFERENTIAL/PLATELET
Basophils Relative: 0 % (ref 0–1)
Eosinophils Relative: 6 % — ABNORMAL HIGH (ref 0–5)
HCT: 33.2 % — ABNORMAL LOW (ref 39.0–52.0)
Hemoglobin: 10.5 g/dL — ABNORMAL LOW (ref 13.0–17.0)
MCH: 27.6 pg (ref 26.0–34.0)
MCHC: 31.6 g/dL (ref 30.0–36.0)
MCV: 87.4 fL (ref 78.0–100.0)
Monocytes Absolute: 0.6 10*3/uL (ref 0.1–1.0)
Monocytes Relative: 9 % (ref 3–12)
Neutro Abs: 4.6 10*3/uL (ref 1.7–7.7)

## 2011-05-12 LAB — PROTIME-INR: INR: 2.08 — ABNORMAL HIGH (ref <1.50)

## 2011-05-14 ENCOUNTER — Telehealth: Payer: Self-pay | Admitting: Cardiovascular Disease

## 2011-05-14 ENCOUNTER — Other Ambulatory Visit: Payer: Self-pay | Admitting: Internal Medicine

## 2011-05-14 ENCOUNTER — Telehealth: Payer: Self-pay

## 2011-05-14 DIAGNOSIS — K529 Noninfective gastroenteritis and colitis, unspecified: Secondary | ICD-10-CM

## 2011-05-14 DIAGNOSIS — D509 Iron deficiency anemia, unspecified: Secondary | ICD-10-CM

## 2011-05-14 HISTORY — PX: ESOPHAGOGASTRODUODENOSCOPY: SHX1529

## 2011-05-14 NOTE — Telephone Encounter (Signed)
Pt is scheduled for 05/17/11- pt instructed to STOP Coumadin 04/14/11-

## 2011-05-14 NOTE — Telephone Encounter (Signed)
Per pt daughter Arline Asp calling. Father in bed all weekend c/o stomach pain / prior bleed in stomach. Unable to eat.  hemoglobin test result on  5/11 Was 10.1.  5/18.still pending. Daughter is very upset no one has called with test result on 5/18.

## 2011-05-14 NOTE — Telephone Encounter (Signed)
Pt has been cleared to stop coumadin for colonoscopy by Dr. [name].  They did not receive the message last week.  Wife is aware and colonoscopy set up for Thursday. I called [facility] regarding last Friday cbc----Hb 10.5  All questions answered and wife reassured. Mylo Red RN

## 2011-05-14 NOTE — Telephone Encounter (Addendum)
pts wife called- pt still having a lot of abd pain and will not eat. She stated he was complaining of the pain moving around in different parts of his stomach. Pt is also not drinking a lot. No nausea/ vomiting. Last BM was yesterday and it was normal. No fever. He is taking his wifes pain meds because he doesn't have any.  Still waiting to hear from Dr. [name] about coumadin, he was on vacation last week.   Please advise.  Spoke with RMR- he stated pt needed tcs soon. Received fax from Dr. [name], OK to hold coumadin for 4 days.

## 2011-05-15 ENCOUNTER — Encounter: Payer: Self-pay | Admitting: Cardiovascular Disease

## 2011-05-17 ENCOUNTER — Encounter: Payer: Medicare Other | Admitting: Internal Medicine

## 2011-05-17 ENCOUNTER — Telehealth: Payer: Self-pay | Admitting: Cardiovascular Disease

## 2011-05-17 ENCOUNTER — Other Ambulatory Visit: Payer: Self-pay | Admitting: Internal Medicine

## 2011-05-17 ENCOUNTER — Ambulatory Visit (HOSPITAL_COMMUNITY)
Admission: RE | Admit: 2011-05-17 | Discharge: 2011-05-17 | Disposition: A | Discharge status: Home / Self Care | Payer: Medicare Other | Source: Ambulatory Visit | Attending: Internal Medicine | Admitting: Internal Medicine

## 2011-05-17 DIAGNOSIS — D509 Iron deficiency anemia, unspecified: Secondary | ICD-10-CM

## 2011-05-17 DIAGNOSIS — Z7901 Long term (current) use of anticoagulants: Secondary | ICD-10-CM | POA: Insufficient documentation

## 2011-05-17 DIAGNOSIS — K921 Melena: Secondary | ICD-10-CM | POA: Insufficient documentation

## 2011-05-17 DIAGNOSIS — R109 Unspecified abdominal pain: Secondary | ICD-10-CM | POA: Insufficient documentation

## 2011-05-17 DIAGNOSIS — K5730 Diverticulosis of large intestine without perforation or abscess without bleeding: Secondary | ICD-10-CM

## 2011-05-17 DIAGNOSIS — K648 Other hemorrhoids: Secondary | ICD-10-CM

## 2011-05-17 DIAGNOSIS — C182 Malignant neoplasm of ascending colon: Secondary | ICD-10-CM

## 2011-05-17 NOTE — Telephone Encounter (Signed)
Pt dx with colon cancer and they want to know who they would recommend for him to see for surgery. Please let them know who they should see.

## 2011-05-17 NOTE — Telephone Encounter (Signed)
Pt's dtr calling again wanting a call today

## 2011-05-17 NOTE — Telephone Encounter (Signed)
Patient's wife Kyle Love, would like for Dr. [name] to know that, patient had a colonoscopy today and was found that he has a cancer tumor that is almost blocking his colon. Also the GI  Md said that patient's Lymph nodes were swollen. Patient and wife would like to know it  Dr. [name] would recommend a surgeon to the surgery. Also she/he would like to speak with Dr. [name] personally

## 2011-05-18 ENCOUNTER — Encounter: Payer: Self-pay | Admitting: Internal Medicine

## 2011-05-18 NOTE — Progress Notes (Signed)
Discussed biopsy with Dr. Luisa Hart this morning. Patient has high-grade adenocarcinoma of the colon. I called Dr. Lamar Blinks office and spoke to his PA, Mr. Dwyane Luo. I conveyed this information to him in Dr. Beatrice Lecher absence.  I also spoke to Dr. [name],  the patient's cardiologist. Dr. [name]  spoke to the patient last evening and recommended he see Dr. Tami Lin and Dr. Purnell Shoemaker  for surgery.  The consensus is also for Mr. Steidle to stay off Coumadin indefinitely at this point in time.    I have called the patient's resident's to inform him of the diagnosis. I got no answer. I did leave a message for return call on the answering machine.

## 2011-05-18 NOTE — Op Note (Signed)
NAME:  Kyle Love, Kyle Love             ACCOUNT NO.:  [id number]  MEDICAL RECORD NO.:  [id number]           PATIENT TYPE:  O  LOCATION:  DAYP                          FACILITY:  [facility]  PHYSICIAN:  R. Roetta Sessions, M.D. DATE OF BIRTH:  1941/12/20  DATE OF PROCEDURE:  05/17/2011 DATE OF DISCHARGE:                              OPERATIVE REPORT   COLONOSCOPY WITH BIOPSY  INDICATIONS FOR PROCEDURE:  This is a 70 year old gentleman with iron- deficiency anemia/Hemoccult positive stool, on Coumadin.  He had hematochezia last year, was admitted at [facility].  At that time, he had a colonoscopy by Dr. Leone Love, who felt etiology of bleeding was diverticula.  He continued to have iron-deficiency anemia and intermittently Hemoccult positive.  Dr. Elnoria Love performed an EGD in January 2012 without significant findings.  He was referred to [place] for a capsule endoscopy which revealed no significant findings.  Recently, he was seen in the emergency department with abdominal cramps and some rectal bleeding.  CT scan demonstrated abnormal ascending colon, questions inflammatory process.  He was given a course of antibiotics which made little difference in his symptoms.  He remains stable with hemoglobin at 10, Hemoccult positive.  Given the abnormal findings on CT and ongoing issues with anemia and bleeding, colonoscopy is now being done.  Risks, benefits, limitations, alternatives, and __________ have been discussed, questions answered.  Please see the documentation medical record.  We cleared it with Kyle Love.  He will stop his Coumadin for 4 days prior to the procedure without bridging.  In fact, the patient did not want any bridging anyway.  PROCEDURE NOTE:  O2 saturation, blood pressure, pulse, respirations were monitored throughout the entire procedure.  Conscious sedation with Versed 6 mg IV, Demerol 50 mg IV in divided doses.  INSTRUMENTS:  Pentax video chip standard adult colonoscope  and 2.8-mm demonstration Pentax scope.  FINDINGS:  Digital rectal exam revealed external hemorrhoids.  ENDOSCOPIC FINDINGS:  The prep was adequate. Colon:  Colonic mucosa was surveyed from the rectosigmoid junction through to left transverse right colon to the area of the ascending colon with the adult scope.  The patient's colon was somewhat elongated and tortuous requiring changing of the patient's position, external abdominal pressure to reach the ascending segment.  The ascending segment was reached.  There was a markedly abnormal segment of the ascending colon, described as an apple core exophytic neoplastic appearing process encroaching significantly on the lumen with surface ulceration.  There was significant encroachment on the lumen with an aperture approximately 10-50 cm.  Initially, I was unable to traverse this segment with the diagnostic scope.  Recurrent looping and redundancy of the colon made it particularly challenging.  I did withdraw the scope and obtained a 2.8-mm skinny demonstration Pentax scope and advanced up to the lesion in the ascending colon and using a combination of external abdominal pressure and changing the patient's position, I was able to traverse this strictured segment of colon which was approximately 5 cm in length.  Upstream, I was looking at the ileocecal valve approximately 5 cm away; however, I ran out of scope and was unable to  intubate the cecum.  However, upstream what I did see the mucosa appeared normal in the area of the ileocecal valve/cecum.  I pulled the scope back across the apple core lesion.  Again this appeared to be neoplastic process rather than inflammatory process.  Multiple biopsies were taken.  From this level, the scope was slowly cautiously withdrawn and all previously mentioned mucosal surfaces were again seen. There was a diminutive polyp at the hepatic flexure which I left alone. The patient did have scattered pancolonic  diverticula; however, the main colonic mucosa appeared normal.  Scope was pulled down to the rectum with examination of rectal mucosa including retroflexion view of the anal verge demonstrated internal hemorrhoids only.  The patient tolerated the procedure very well with the cecal withdrawal time on the second time 11 minutes.  IMPRESSION: 1. Exophytic apple core appearing lesion in the ascending colon     producing significant encroachment on the lumen as described above,     looking most like colonic neoplasm status post biopsy.  This lesion     is just distal to the ileocecal valve.  I was unable to deeply     intubate the cecum as described above. 2. Pancolonic diverticula. 3. Diminutive hepatic flexure polyp, not manipulated. 4. Internal hemorrhoids, otherwise normal rectum.  I re-reviewed the CT scan with Dr. __________ recently.  The patient has markedly abnormal ascending segment on the CT with at least one enlarged lymph node, quite a bit of stranding around this segment.  This would be an atypical presentation for diverticular abscess although I suppose it is remotely possible.  Either way, this is going to ultimately require surgical intervention.  RECOMMENDATIONS: 1. Low residue diet. 2. We will allow Kyle Love to resume his Coumadin for the time     being. 3. Put a rush on the biopsies. 4. Anticipate surgical consultation in the near future. 5. Further recommendations to follow.     Kyle Love, M.D.     RMR/MEDQ  D:  05/17/2011  T:  05/17/2011  Job:  [id number]  cc:   Kyle Pick. Kyle Emms, MD, [facility] [address]  [address] [place] [address]  Kyle Love, M.D. Fax: [phone]  Electronically Signed by Kyle Love M.D. on 05/18/2011 01:48:45 PM

## 2011-05-20 ENCOUNTER — Emergency Department (HOSPITAL_COMMUNITY)
Admission: EM | Admit: 2011-05-20 | Discharge: 2011-05-21 | Disposition: A | Discharge status: Home / Self Care | Payer: Medicare Other | Attending: Emergency Medicine | Admitting: Emergency Medicine

## 2011-05-20 DIAGNOSIS — K8020 Calculus of gallbladder without cholecystitis without obstruction: Secondary | ICD-10-CM | POA: Insufficient documentation

## 2011-05-20 DIAGNOSIS — I25810 Atherosclerosis of coronary artery bypass graft(s) without angina pectoris: Secondary | ICD-10-CM | POA: Insufficient documentation

## 2011-05-20 DIAGNOSIS — Z954 Presence of other heart-valve replacement: Secondary | ICD-10-CM | POA: Insufficient documentation

## 2011-05-20 DIAGNOSIS — R109 Unspecified abdominal pain: Secondary | ICD-10-CM | POA: Insufficient documentation

## 2011-05-20 DIAGNOSIS — Z7901 Long term (current) use of anticoagulants: Secondary | ICD-10-CM | POA: Insufficient documentation

## 2011-05-20 DIAGNOSIS — C189 Malignant neoplasm of colon, unspecified: Secondary | ICD-10-CM | POA: Insufficient documentation

## 2011-05-21 ENCOUNTER — Emergency Department (HOSPITAL_COMMUNITY): Payer: Medicare Other

## 2011-05-21 ENCOUNTER — Encounter (HOSPITAL_COMMUNITY): Payer: Self-pay

## 2011-05-21 LAB — URINALYSIS, ROUTINE W REFLEX MICROSCOPIC
Protein, ur: NEGATIVE mg/dL
Urobilinogen, UA: 1 mg/dL (ref 0.0–1.0)

## 2011-05-21 LAB — CBC
HCT: 35.7 % — ABNORMAL LOW (ref 39.0–52.0)
MCV: 86.9 fL (ref 78.0–100.0)
RBC: 4.11 MIL/uL — ABNORMAL LOW (ref 4.22–5.81)
RDW: 15 % (ref 11.5–15.5)
WBC: 8 10*3/uL (ref 4.0–10.5)

## 2011-05-21 LAB — COMPREHENSIVE METABOLIC PANEL
Alkaline Phosphatase: 97 U/L (ref 39–117)
BUN: 13 mg/dL (ref 6–23)
Chloride: 99 mEq/L (ref 96–112)
Glucose, Bld: 88 mg/dL (ref 70–99)
Potassium: 4.6 mEq/L (ref 3.5–5.1)
Total Bilirubin: 0.3 mg/dL (ref 0.3–1.2)

## 2011-05-21 LAB — DIFFERENTIAL
Eosinophils Relative: 5 % (ref 0–5)
Lymphocytes Relative: 17 % (ref 12–46)
Lymphs Abs: 1.4 10*3/uL (ref 0.7–4.0)
Monocytes Relative: 9 % (ref 3–12)

## 2011-05-21 LAB — PROTIME-INR: Prothrombin Time: 14.3 seconds (ref 11.6–15.2)

## 2011-05-21 MED ORDER — IOHEXOL 300 MG/ML  SOLN
100.0000 mL | Freq: Once | INTRAMUSCULAR | Status: AC | PRN
  Administered 2011-05-21: 100 mL via INTRAVENOUS

## 2011-05-22 ENCOUNTER — Telehealth: Payer: Self-pay | Admitting: Internal Medicine

## 2011-05-22 NOTE — Progress Notes (Signed)
Cc to PCP & Dr. [name]

## 2011-05-22 NOTE — Telephone Encounter (Signed)
Pt is scheduled to see Dr Abbey Chatters on 05/24/11 @ 11:00- pt is aware

## 2011-05-24 NOTE — Progress Notes (Signed)
Cc to Dr. Purnell Shoemaker

## 2011-05-24 NOTE — Telephone Encounter (Signed)
PER PT HAD COLONOSCOPY DONE FOUND CA IS SCHEDULED FOR SURGERY THE END OF June./CY

## 2011-05-28 ENCOUNTER — Telehealth: Payer: Self-pay | Admitting: Cardiovascular Disease

## 2011-05-28 NOTE — Telephone Encounter (Signed)
Faxed LOV, EKG, CXR & Echo to [facility] ([phone]).

## 2011-05-31 ENCOUNTER — Other Ambulatory Visit: Payer: Self-pay | Admitting: General Surgery

## 2011-05-31 ENCOUNTER — Encounter (HOSPITAL_COMMUNITY): Payer: Medicare Other

## 2011-05-31 LAB — CBC
HCT: 38 % — ABNORMAL LOW (ref 39.0–52.0)
Hemoglobin: 12 g/dL — ABNORMAL LOW (ref 13.0–17.0)
MCH: 27.4 pg (ref 26.0–34.0)
MCHC: 31.6 g/dL (ref 30.0–36.0)
RDW: 14.9 % (ref 11.5–15.5)

## 2011-05-31 LAB — COMPREHENSIVE METABOLIC PANEL
Alkaline Phosphatase: 108 U/L (ref 39–117)
BUN: 14 mg/dL (ref 6–23)
CO2: 30 mEq/L (ref 19–32)
Chloride: 98 mEq/L (ref 96–112)
Creatinine, Ser: 0.98 mg/dL (ref 0.4–1.5)
GFR calc non Af Amer: >60 mL/min (ref >60)
Glucose, Bld: 90 mg/dL (ref 70–99)
Potassium: 5.1 mEq/L (ref 3.5–5.1)
Total Bilirubin: 0.4 mg/dL (ref 0.3–1.2)

## 2011-05-31 LAB — ABO/RH: ABO/RH(D): O POS

## 2011-05-31 LAB — DIFFERENTIAL
Eosinophils Relative: 4 % (ref 0–5)
Lymphocytes Relative: 9 % — ABNORMAL LOW (ref 12–46)
Monocytes Absolute: 0.6 10*3/uL (ref 0.1–1.0)
Monocytes Relative: 7 % (ref 3–12)
Neutro Abs: 6.5 10*3/uL (ref 1.7–7.7)

## 2011-05-31 LAB — SURGICAL PCR SCREEN: Staphylococcus aureus: NEGATIVE

## 2011-06-07 ENCOUNTER — Encounter: Payer: Self-pay | Admitting: Cardiovascular Disease

## 2011-06-08 ENCOUNTER — Inpatient Hospital Stay (HOSPITAL_COMMUNITY)
Admission: RE | Admit: 2011-06-08 | Discharge: 2011-06-13 | DRG: 330 | Disposition: A | Discharge status: Home / Self Care | Payer: Medicare Other | Source: Ambulatory Visit | Attending: General Surgery | Admitting: General Surgery

## 2011-06-08 ENCOUNTER — Other Ambulatory Visit (INDEPENDENT_AMBULATORY_CARE_PROVIDER_SITE_OTHER): Payer: Self-pay | Admitting: General Surgery

## 2011-06-08 ENCOUNTER — Inpatient Hospital Stay (HOSPITAL_COMMUNITY): Payer: Medicare Other

## 2011-06-08 DIAGNOSIS — I509 Heart failure, unspecified: Secondary | ICD-10-CM | POA: Diagnosis present

## 2011-06-08 DIAGNOSIS — J4489 Other specified chronic obstructive pulmonary disease: Secondary | ICD-10-CM | POA: Diagnosis present

## 2011-06-08 DIAGNOSIS — C182 Malignant neoplasm of ascending colon: Principal | ICD-10-CM | POA: Diagnosis present

## 2011-06-08 DIAGNOSIS — K560 Paralytic ileus: Secondary | ICD-10-CM | POA: Diagnosis not present

## 2011-06-08 DIAGNOSIS — I4891 Unspecified atrial fibrillation: Secondary | ICD-10-CM | POA: Diagnosis present

## 2011-06-08 DIAGNOSIS — D509 Iron deficiency anemia, unspecified: Secondary | ICD-10-CM | POA: Diagnosis present

## 2011-06-08 DIAGNOSIS — F17200 Nicotine dependence, unspecified, uncomplicated: Secondary | ICD-10-CM | POA: Diagnosis present

## 2011-06-08 DIAGNOSIS — J449 Chronic obstructive pulmonary disease, unspecified: Secondary | ICD-10-CM | POA: Diagnosis present

## 2011-06-08 DIAGNOSIS — K8020 Calculus of gallbladder without cholecystitis without obstruction: Secondary | ICD-10-CM | POA: Diagnosis present

## 2011-06-08 DIAGNOSIS — Z5331 Laparoscopic surgical procedure converted to open procedure: Secondary | ICD-10-CM

## 2011-06-08 DIAGNOSIS — K668 Other specified disorders of peritoneum: Secondary | ICD-10-CM | POA: Diagnosis present

## 2011-06-08 DIAGNOSIS — Z952 Presence of prosthetic heart valve: Secondary | ICD-10-CM

## 2011-06-09 HISTORY — PX: COLON SURGERY: SHX602

## 2011-06-09 HISTORY — PX: CHOLECYSTECTOMY: SHX55

## 2011-06-09 LAB — CBC
MCH: 27.4 pg (ref 26.0–34.0)
MCHC: 31.8 g/dL (ref 30.0–36.0)
MCV: 86.2 fL (ref 78.0–100.0)
Platelets: 274 10*3/uL (ref 150–400)
RBC: 3.83 MIL/uL — ABNORMAL LOW (ref 4.22–5.81)

## 2011-06-09 LAB — BASIC METABOLIC PANEL
CO2: 28 mEq/L (ref 19–32)
Calcium: 8.2 mg/dL — ABNORMAL LOW (ref 8.4–10.5)
Creatinine, Ser: 0.84 mg/dL (ref 0.50–1.35)
GFR calc non Af Amer: >60 mL/min (ref >60)

## 2011-06-10 LAB — BASIC METABOLIC PANEL
Calcium: 8.4 mg/dL (ref 8.4–10.5)
Chloride: 104 mEq/L (ref 96–112)
Creatinine, Ser: 0.92 mg/dL (ref 0.50–1.35)
GFR calc Af Amer: >60 mL/min (ref >60)
Sodium: 138 mEq/L (ref 135–145)

## 2011-06-10 LAB — CBC
MCH: 27.4 pg (ref 26.0–34.0)
MCV: 87.6 fL (ref 78.0–100.0)
Platelets: 248 10*3/uL (ref 150–400)
RDW: 15.3 % (ref 11.5–15.5)
WBC: 11.4 10*3/uL — ABNORMAL HIGH (ref 4.0–10.5)

## 2011-06-11 LAB — BASIC METABOLIC PANEL
Calcium: 8.4 mg/dL (ref 8.4–10.5)
GFR calc non Af Amer: >60 mL/min (ref >60)
Sodium: 136 mEq/L (ref 135–145)

## 2011-06-12 DIAGNOSIS — I4891 Unspecified atrial fibrillation: Secondary | ICD-10-CM

## 2011-06-12 LAB — CROSSMATCH
ABO/RH(D): O POS
Antibody Screen: NEGATIVE
Unit division: 0

## 2011-06-15 NOTE — Consult Note (Signed)
NAME:  Kyle Love, Kyle Love NO.:  [id number]  MEDICAL RECORD NO.:  [id number]  LOCATION:  1438                         FACILITY:  [facility]  PHYSICIAN:  Noralyn Pick. [name], MD, FACCDATE OF BIRTH:  September 01, 1941  DATE OF CONSULTATION: DATE OF DISCHARGE:                                CONSULTATION   HISTORY OF PRESENT ILLNESS:  A 70 year old patient we are asked to see by Dr. Abbey Chatters for atrial fibrillation.  The patient is well known to me.  He unfortunately has recently been diagnosed with metastatic colon CA.  He has a distant history of colon CA, which was treated with polypectomy and followed with serial colonoscopies.  He has had problems with rectal bleeding.  He has chronic atrial fibrillation.  He has failed multiple cardioversions including on antiarrhythmic drugs such as Rythmol.  We have settled over the last 2 few years for rate control and anticoagulation, however, with his Coumadin, he has had variable episodes of rectal bleeding.  The patient was found to have a significant apple core type lesion on the recent colonoscopy by Dr. Jena Gauss in [place].  He is status post colectomy by Dr. Abbey Chatters on 15th, which showed omental metastatic disease.  The patient has been off Coumadin for the last 4-5 weeks in lieu of his bleeding and recent diagnosis.  He does not have any known coronary artery disease.  He has a tissue aortic valve that was placed in 2008 by Dr. Saintclair Halsted.  His reason for anticoagulation involves his AFib, but not his valve.  I had a lengthy discussion with the patient and his wife, who I know very well, given his recent surgical incisions and need for followup oncological services including likely chemotherapy.  I feel that we should continue to hold Coumadin at this time with any systemic chemotherapy.  He is likely to have increased risk of bleeding with thrombocytopenia.  The patient's overall long-term prognosis appears poor, given his  stage IV disease.  He is currently not having any significant chest pain, palpitations, and hemodynamics are stable.  He has had 2 small bowel movements and is taking a soft solid diet.  REVIEW OF SYSTEMS:  His 10-point review of systems otherwise negative.  His past medical history is remarkable for, 1. Atrial fibrillation with failed cardioversion. 2. Chronic anticoagulation with Coumadin. 3. History of tissue aortic valve placement by Dr. Lavinia Sharps in 2008. 4. History of hypertension. 5. History of tobacco abuse, which I believe is ongoing. 6. History of dental procedure with ascending aortic aneurysm     replacement during the time of his valve. 7. History of colon cancer with previous polypectomy, recurrent.  ALLERGIES:  The patient is allergic to PENICILLIN.  CURRENT MEDICATIONS IN THE HOSPITAL: 1. Dulcolax. 2. Lovenox 40 mg subcu q.24 h. 3. Lopressor 75 b.i.d. for rate control. 4. Protonix 40 mg a day. 5. Hydrocodone for pain. 6. Zofran for nausea.  FAMILY HISTORY:  Noncontributory.  There is no immediate family history of colon cancer.  PHYSICAL EXAMINATION:  GENERAL:  His exam is remarkable for an elderly male in mild distress from his abdomen. VITAL SIGNS:  He is in AFib at a rate of 88, blood pressure  is 149/89, temperature is 98.2, sats are 97% on room air. HEENT:  Unremarkable. NECK:  Carotids are without bruit.  No lymphadenopathy, thyromegaly, JVP elevation. LUNGS:  Clear.  Good diaphragmatic motion.  No wheezing.  Status post sternotomy. HEART:  S1, second heart sound is normal tissue prosthesis. No aortic insufficiency heard.  Systolic ejection murmur through the tissue valve. PMI normal. ABDOMEN:  With bandages, mild tympany and distention with mild tenderness, but no rebound.  Distal pulses are intact.  No edema. NEURO:  Nonfocal. SKIN:  Warm and dry.  No muscular weakness.  IMAGING STUDIES:  Telemetry shows atrial fibrillation with reasonable rate  control.  LABORATORY DATA:  Potassium 3.8, creatinine 0.87, hematocrit 32.6, platelet count 248.  Surgical pathology is pending, but as I recall talking to Dr. Jena Gauss, he had high-grade adenocarcinoma on his biopsy from his colonoscopy.  IMPRESSION: 1. Atrial fibrillation with good rate control, would not restart     Coumadin at this time since the patient would appear to need     oncology consultation for chemotherapy.  Continue Lopressor 75     b.i.d. for rate control. 2. Previous tissue aortic valve replacement, normal by exam.  No     indication for anticoagulation based on tissue valve.  The patient     has good LV function and no known coronary artery disease. 3. Metastatic colon cancer, status post right colectomy, pain control,     and advancement of diet for surgical services, we will need     outpatient oncology referral for chemotherapy regimen.  Again, my preference is to keep him off his Coumadin since he is likely to need aggressive chemotherapy for stage IV adenocarcinoma and given his risk of thrombocytopenia and overall poor prognosis, prefer to keep him off Coumadin at this time.     Noralyn Pick. [name], MD, [facility]  PCN/MEDQ  D:  06/12/2011  T:  06/13/2011  Job:  [id number]  Electronically Signed by Charlton Haws MD [facility] on 06/15/2011 12:22:21 PM

## 2011-06-20 ENCOUNTER — Ambulatory Visit (INDEPENDENT_AMBULATORY_CARE_PROVIDER_SITE_OTHER): Payer: Medicare Other | Admitting: General Surgery

## 2011-06-20 ENCOUNTER — Encounter (INDEPENDENT_AMBULATORY_CARE_PROVIDER_SITE_OTHER): Payer: Self-pay | Admitting: General Surgery

## 2011-06-20 DIAGNOSIS — C801 Malignant (primary) neoplasm, unspecified: Secondary | ICD-10-CM

## 2011-06-20 DIAGNOSIS — C189 Malignant neoplasm of colon, unspecified: Secondary | ICD-10-CM

## 2011-06-20 NOTE — Progress Notes (Signed)
Subjective:     Patient ID: Kyle Love, male   DOB: November 07, 1941, 70 y.o.   MRN: [id number]    There were no vitals taken for this visit.    HPI  He is here for his first postoperative visit after laparoscopic assisted right colectomy, cholecystectomy, and biopsy of omental mass. Pathology is consistent with stage IV colon cancer. He is not eating much. Bowels are moving. No fever or chills. Review of Systems     Objective:   Physical Exam General: He looks well and is in no acute distress.  Abdomen: Soft, multiple small incisions with staples are clean and intact. Midline incision is clean and intact with a few gaps that are very superficial. No infection. Staples were removed and Steri-Strips were applied.    Assessment:     Post right colectomy for colon cancer June 08, 2011. Also had a cholecystectomy. Pathology is stage IV and he is aware of this. He has appointment with medical oncology next week.    Plan:        Continue light activities. He may drive in one to 2 weeks. I recommended 6 small meals per day. Return visit in 4 weeks.

## 2011-06-20 NOTE — Patient Instructions (Signed)
6 small meals per day.  Continue light activities. Shower daily and apply dry dressing to wound.  May drive in 1-2 weeks.

## 2011-06-25 ENCOUNTER — Other Ambulatory Visit (INDEPENDENT_AMBULATORY_CARE_PROVIDER_SITE_OTHER): Payer: Self-pay | Admitting: General Surgery

## 2011-06-25 MED ORDER — HYDROCODONE-ACETAMINOPHEN 5-325 MG PO TABS: 1.0000 | ORAL_TABLET | Freq: Four times a day (QID) | ORAL | Status: AC | PRN

## 2011-06-25 NOTE — Op Note (Signed)
NAME:  HILARY, PUNDT NO.:  [id number]  MEDICAL RECORD NO.:  [id number]  LOCATION:  1234                         FACILITY:  [facility]  PHYSICIAN:  Adolph Pollack, M.D.DATE OF BIRTH:  02/27/1941  DATE OF PROCEDURE:  06/08/2011 DATE OF DISCHARGE:                              OPERATIVE REPORT   PREOPERATIVE DIAGNOSES: 1. Cholelithiasis. 2. Right colon cancer.  POSTOPERATIVE DIAGNOSES: 1. Cholelithiasis. 2. Metastatic right colon cancer.  PROCEDURES: 1. Laparoscopic cholecystectomy, intraoperative cholangiogram. 2. Laparoscopic converted to open right colectomy with removal of     omental mass.  SURGEON:  Adolph Pollack, M.D.  ASSISTANT:  Angelia Mould. Derrell Lolling, M.D.  ANESTHESIA:  General.  INDICATIONS:  This is a 70 year old man who was admitted to hospital as he was having some discomfort and was thought to have colitis.  He subsequently had some Hemoccult positive stool.  Dr. Jena Gauss performed acolonoscopy, May 17, 2011, demonstrated an apple-core lesion in the ascending colon with some luminal area, this was biopsied and was positive for adenocarcinoma.  There was no obvious metastatic disease to the liver but there appeared be some adenopathy in the region.  He has also been having what appeared to be biliary colic type symptoms, has multiple gallstones.  He has been seen by Dr. Charlton Haws preoperatively because of some heart disease and has been on chronic Coumadin for chronic atrial fibrillation, this has been held.  He now presents for the above procedures.  TECHNIQUE:  He was seen in the holding area and brought to the operating room, placed supine on the operating table and general anesthetic was administered.  Foley catheter was inserted.  Hair on the abdominal wall was clipped and abdominal wall was widely sterilely prepped and draped.  Marcaine was infiltrated in the supraumbilical region.  A small supraumbilical incision was made through  skin and subcutaneous tissue and peritoneum entering the peritoneal cavity under direct vision.  A pursestring suture of 0 Vicryl was placed around the fascial edges.  A Hassan trocar was introduced to the peritoneal cavity and a pneumoperitoneum was created by insufflation of CO2 gas.  Next, laparoscope was introduced and there was no underlying bleeding or organ injury.  Under direct vision, a 5 mm trocar was placed in epigastric incision and two 5 mm trocars were placed in the right upper quadrant region.  He was placed in reverse Trendelenburg position with right side tilted slightly up.  The fundus of the gallbladder was grasped.  There were no acute inflammatory changes.  This was retracted towards the right shoulder.  The infundibulum was grasped and using blunt dissection on the infundibulum I mobilized it and it was able to be retracted laterally.  The cystic duct was identified and a window created around it and a critical view achieved.  A clip was placed in the cystic duct and gallbladder junction and small incision was made in the cystic duct.  A cholangiocath was passed through the anterior abdominal wall, placed into the cystic duct and cholangiogram performed.  Under realtime fluoroscopy, the contrast was injected into the cystic duct.  The common bile duct filled promptly and drained promptly to the duodenum.  The right and  left hepatic ducts and common hepatic also filled.  Final report is pending per radiologist interpretation.  The cholangiocatheter was removed, cystic duct was then clipped 3 times on the biliary side and divided.  I then identified an anterior branch of the cystic artery close to the gallbladder, it was clipped and divided.  The posterior branch was identified close to the gallbladder and clipped and divided.  I dissected the gallbladder free from the liver using electrocautery.  From the retracting clamp, a hole was made in the gallbladder and  some stones leaked out.  I evacuated the stones by way of suction as well as stone removal device.  Once the gallbladder was freed from liver, I then placed it in Endopouch bag and removed it through the supraumbilical port site.  A Hassan trocar was replaced.  I then copiously irrigated out the perihepatic area and evacuated more stones and fluid.  The gallbladder fossa was examined and there was some bleeding controlled with electrocautery.  No evidence of bile leak. Copious irrigation was performed once again and the stones were evacuated.  I then saw no further evidence of stones at this time.  Following this, I then addressed the right colon lesion.  There were adhesions between the cecum and proximal ascending colon and the abdominal wall.  I placed a 5-mm trocar in the left midabdomen.  I sharply divided these adhesions beginning to medialize the right colon. I then noted a firm mass that was little bit adherent to the abdominal wall.  Using sharp dissection, I excised part of the abdominal wall and peritoneum and a small amount of muscle to go with the mass.  I then I removed the supraumbilical trocar and made a small incision to put a GelPort in.  Using hand assist, I felt the large mass going above the ascending colon.  It was densely adherent to the retroperitoneal area in what appeared to be the infraduodenal area.  I mobilized the hepatic flexure sharply.  I mobilized the right colon by dividing lateral attachments and mobilized the terminal ileum up.  However, I felt that was not going to be able to complete the resection laparoscopically or hand assisted because of dense adherence to the retroperitoneal structures.  Thus I removed the GelPort and extended the incision to below the umbilicus and to the epigastrium.  I then rotated the tumor towards me and noted a severe desmoplastic reaction to the retroperitoneum inferior to the duodenum.  I carefully used blunt dissection  electrocautery to mobilize this.  I then noted that there were multiple omental nodules present as well.  I divided the terminal ileum with a GIA stapler and mobilized the mesentery by dividing it with the LigaSure.  I brought this out mesentery of the ascending colon up to the area of the tumor that was densely adherent to the duodenal area.  I then picked a point in the mid transverse colon and divided it with a GIA stapler and divided some omentum with it.  I then used careful blunt dissection and electrocautery to mobilize the tumor off the retroperitoneal area.  I then placed clamps in mesocolon going down to the retroperitoneal reaction and divided these with a LigaSure basically freeing up the specimen and having it passed off the field.  Suture ligature was performed on these vessels close to the root of mesentery. Small amount of bleeding was noted and the suture ligatures were used to control this.  Following this I copiously irrigated the  area and noted no evidence of bleeding.  The duodenum was intact. This area did not invade the pancreas in fact, this was inferior to the duodenum.  There were multiple omental nodules.  I did excise a large nodule like mass from the omentum near the body of the stomach at the greater curvature and sent this as a separate specimen to Pathology.  Following this I then performed a side-to-side stapled anastomosis between the terminal ileum and the mid transverse colon.  Common defect was closed with a linear noncutting stapler.  Bleeding from the common defect closure site was controlled with silk sutures.  A crotch stitch of 3-0 silk was placed.  The anastomosis was patent, viable, under no tension and dropped back into the abdominal cavity and gloves were changed.  Copious irrigation was then performed.  I inspected the anastomosis which was viable.  There was no evidence of bleeding and I expected the gallbladder fossa, there was no bile  leak.  A piece of Surgicel was placed in the gallbladder fossa.  The irrigation fluid was evacuated as much as possible.  Following this, I then removed all laparotomy pads and I closed the midline incision with a running #1 PDS suture.  Needle, sponge, and instrument counts were reported to be correct.  The subcutaneous tissue was irrigated.  All trocars were removed and trocar skin incisions were closed with staples.  I loosely closed the midline incision with staples and put Telfa wicks in between the staples as his was a class 3 wound. Bulky dressings were applied.  He tolerated the procedure well without any apparent complications and was taken to recovery room in satisfactory condition.     Adolph Pollack, M.D.     Kari Baars  D:  06/08/2011  T:  06/08/2011  Job:  [id number]  cc:   Noralyn Pick. [name], MD, [facility] [address]  [address] [place] [address]  Corrie Mckusick, M.D. Fax: [phone]  R. Roetta Sessions, MD FACP [facility] [address] [place] [address]  Electronically Signed by Avel Peace M.D. on 06/25/2011 09:22:58 AM

## 2011-06-25 NOTE — Telephone Encounter (Signed)
Received refill request per fax approved per dr.Rosenbower

## 2011-06-26 ENCOUNTER — Ambulatory Visit (HOSPITAL_COMMUNITY)
Admission: RE | Admit: 2011-06-26 | Discharge: 2011-06-26 | Disposition: A | Discharge status: Home / Self Care | Payer: Medicare Other | Source: Ambulatory Visit | Attending: Oncology | Admitting: Oncology

## 2011-06-26 ENCOUNTER — Encounter (HOSPITAL_BASED_OUTPATIENT_CLINIC_OR_DEPARTMENT_OTHER): Payer: Medicare Other | Admitting: Oncology

## 2011-06-26 DIAGNOSIS — C801 Malignant (primary) neoplasm, unspecified: Secondary | ICD-10-CM | POA: Insufficient documentation

## 2011-06-26 DIAGNOSIS — C189 Malignant neoplasm of colon, unspecified: Secondary | ICD-10-CM | POA: Insufficient documentation

## 2011-06-26 DIAGNOSIS — M79609 Pain in unspecified limb: Secondary | ICD-10-CM | POA: Insufficient documentation

## 2011-06-26 DIAGNOSIS — C785 Secondary malignant neoplasm of large intestine and rectum: Secondary | ICD-10-CM

## 2011-06-26 DIAGNOSIS — M7989 Other specified soft tissue disorders: Secondary | ICD-10-CM

## 2011-06-28 ENCOUNTER — Other Ambulatory Visit: Payer: Self-pay | Admitting: Oncology

## 2011-06-28 DIAGNOSIS — C189 Malignant neoplasm of colon, unspecified: Secondary | ICD-10-CM

## 2011-06-29 ENCOUNTER — Telehealth (INDEPENDENT_AMBULATORY_CARE_PROVIDER_SITE_OTHER): Payer: Self-pay

## 2011-06-29 NOTE — Telephone Encounter (Signed)
Called in [place] to [place] at [phone].

## 2011-07-04 ENCOUNTER — Encounter (INDEPENDENT_AMBULATORY_CARE_PROVIDER_SITE_OTHER): Payer: Self-pay | Admitting: General Surgery

## 2011-07-04 ENCOUNTER — Ambulatory Visit (INDEPENDENT_AMBULATORY_CARE_PROVIDER_SITE_OTHER): Payer: Medicare Other | Admitting: General Surgery

## 2011-07-04 DIAGNOSIS — C189 Malignant neoplasm of colon, unspecified: Secondary | ICD-10-CM

## 2011-07-04 MED ORDER — OXYCODONE-ACETAMINOPHEN 5-325 MG PO TABS: 1.0000 | ORAL_TABLET | ORAL | Status: DC | PRN

## 2011-07-04 NOTE — Patient Instructions (Signed)
Eat multiple small meals.  Do not drive while taking pain medicine.

## 2011-07-04 NOTE — Progress Notes (Signed)
He is here for a postop visit following right colectomy and cholecystectomy for a stage IV colon cancer.  He has seen Dr. Truett Perna to discuss chemotherapy.  Dr. Truett Perna would like him to get a Portacath placed.  He does not have much appetite and has lost 20-30 pounds thus far.  Having some incisional pain.  Bowels are moving.  Exam:  Looks well and in NAD.  Abd:  Incisions are clean, dry, intact and solid.  Assess:  Stage IV colon cancer s/p right colectomy.  Progressing slowly.  Plan:  Try 6 small meals a day.  Continue light activities. Will schedule portacath insertion.  F/u in 6 weeks.  The procedure risks and aftercare been explained. Risks include but are not limited to bleeding, infection, malfunction, pneumothorax, wound problems, DVT.

## 2011-07-11 ENCOUNTER — Other Ambulatory Visit: Payer: Self-pay | Admitting: Oncology

## 2011-07-11 ENCOUNTER — Encounter (HOSPITAL_COMMUNITY): Payer: Self-pay

## 2011-07-11 ENCOUNTER — Encounter (HOSPITAL_BASED_OUTPATIENT_CLINIC_OR_DEPARTMENT_OTHER): Payer: Medicare Other | Admitting: Oncology

## 2011-07-11 ENCOUNTER — Encounter (HOSPITAL_COMMUNITY)
Admission: RE | Admit: 2011-07-11 | Discharge: 2011-07-11 | Disposition: A | Discharge status: Home / Self Care | Payer: Medicare Other | Source: Ambulatory Visit | Attending: Oncology | Admitting: Oncology

## 2011-07-11 DIAGNOSIS — K7689 Other specified diseases of liver: Secondary | ICD-10-CM | POA: Insufficient documentation

## 2011-07-11 DIAGNOSIS — C182 Malignant neoplasm of ascending colon: Secondary | ICD-10-CM

## 2011-07-11 DIAGNOSIS — C189 Malignant neoplasm of colon, unspecified: Secondary | ICD-10-CM | POA: Insufficient documentation

## 2011-07-11 DIAGNOSIS — R609 Edema, unspecified: Secondary | ICD-10-CM | POA: Insufficient documentation

## 2011-07-11 DIAGNOSIS — R599 Enlarged lymph nodes, unspecified: Secondary | ICD-10-CM | POA: Insufficient documentation

## 2011-07-11 LAB — COMPREHENSIVE METABOLIC PANEL
ALT: 19 U/L (ref 0–53)
CO2: 28 mEq/L (ref 19–32)
Calcium: 10.3 mg/dL (ref 8.4–10.5)
Chloride: 96 mEq/L (ref 96–112)
Sodium: 135 mEq/L (ref 135–145)
Total Protein: 7.5 g/dL (ref 6.0–8.3)

## 2011-07-11 LAB — CBC WITH DIFFERENTIAL/PLATELET
BASO%: 0.1 % (ref 0.0–2.0)
Basophils Absolute: 0 10*3/uL (ref 0.0–0.1)
EOS%: 2.3 % (ref 0.0–7.0)
Eosinophils Absolute: 0.2 10*3/uL (ref 0.0–0.5)
HCT: 37.6 % — ABNORMAL LOW (ref 38.4–49.9)
HGB: 12.3 g/dL — ABNORMAL LOW (ref 13.0–17.1)
LYMPH%: 10.3 % — ABNORMAL LOW (ref 14.0–49.0)
MCH: 28.4 pg (ref 27.2–33.4)
MCHC: 32.7 g/dL (ref 32.0–36.0)
MCV: 86.8 fL (ref 79.3–98.0)
MONO#: 0.6 10*3/uL (ref 0.1–0.9)
MONO%: 8.9 % (ref 0.0–14.0)
NEUT#: 5.7 10*3/uL (ref 1.5–6.5)
NEUT%: 78.4 % — ABNORMAL HIGH (ref 39.0–75.0)
Platelets: 270 10*3/uL (ref 140–400)
RBC: 4.33 10*6/uL (ref 4.20–5.82)
RDW: 18.1 % — ABNORMAL HIGH (ref 11.0–14.6)
WBC: 7.3 10*3/uL (ref 4.0–10.3)
lymph#: 0.7 10*3/uL — ABNORMAL LOW (ref 0.9–3.3)

## 2011-07-11 LAB — CEA: CEA: 1.5 ng/mL (ref 0.0–5.0)

## 2011-07-11 MED ORDER — FLUDEOXYGLUCOSE F - 18 (FDG) INJECTION
16.7000 | Freq: Once | INTRAVENOUS | Status: AC | PRN
  Administered 2011-07-11: 16.7 via INTRAVENOUS

## 2011-07-13 ENCOUNTER — Telehealth (INDEPENDENT_AMBULATORY_CARE_PROVIDER_SITE_OTHER): Payer: Self-pay

## 2011-07-13 ENCOUNTER — Encounter (HOSPITAL_BASED_OUTPATIENT_CLINIC_OR_DEPARTMENT_OTHER): Payer: Medicare Other | Admitting: Oncology

## 2011-07-13 ENCOUNTER — Encounter (HOSPITAL_COMMUNITY)
Admission: RE | Admit: 2011-07-13 | Discharge: 2011-07-13 | Disposition: A | Discharge status: Home / Self Care | Payer: Medicare Other | Source: Ambulatory Visit | Attending: General Surgery | Admitting: General Surgery

## 2011-07-13 DIAGNOSIS — B370 Candidal stomatitis: Secondary | ICD-10-CM

## 2011-07-13 DIAGNOSIS — C182 Malignant neoplasm of ascending colon: Secondary | ICD-10-CM

## 2011-07-13 DIAGNOSIS — I4891 Unspecified atrial fibrillation: Secondary | ICD-10-CM

## 2011-07-13 DIAGNOSIS — R109 Unspecified abdominal pain: Secondary | ICD-10-CM

## 2011-07-13 LAB — BASIC METABOLIC PANEL
BUN: 18 mg/dL (ref 6–23)
Creatinine, Ser: 1 mg/dL (ref 0.50–1.35)
GFR calc Af Amer: >60 mL/min (ref >60)
GFR calc non Af Amer: >60 mL/min (ref >60)
Glucose, Bld: 116 mg/dL — ABNORMAL HIGH (ref 70–99)

## 2011-07-13 LAB — CBC
HCT: 36.5 % — ABNORMAL LOW (ref 39.0–52.0)
MCH: 28.1 pg (ref 26.0–34.0)
MCHC: 33.7 g/dL (ref 30.0–36.0)
RDW: 16.7 % — ABNORMAL HIGH (ref 11.5–15.5)

## 2011-07-13 LAB — DIFFERENTIAL
Basophils Absolute: 0 10*3/uL (ref 0.0–0.1)
Basophils Relative: 0 % (ref 0–1)
Eosinophils Relative: 3 % (ref 0–5)
Lymphocytes Relative: 9 % — ABNORMAL LOW (ref 12–46)
Monocytes Absolute: 0.6 10*3/uL (ref 0.1–1.0)

## 2011-07-13 NOTE — Telephone Encounter (Signed)
Pt's wife called concerned about her husband s/p colon surgery 5weeks ago.  He started vomiting last night food, water and meds.  He has no appetite and won't eat much.  He has lost 30lbs in 5 weeks.  He is having a sharp pain today to the left of his umbilicus that feels like his stomach is tied in a knot.  Prior to this it was just intermittent dull pain.  He is barely drinking liquids.  He has no fever.  His bowel movements are twice a day and sometimes skip a day or two, but loose since surgery.  He is scheduled today for blood work at [facility] and Dr Truett Perna at [facility].    I paged Dr Abbey Chatters and he states that it may be because of the metastatic cancer.  Have him see Dr Truett Perna today and check him and Dr Truett Perna can call him if needed.  I notified pt and I encouraged fluid intake.  The patient asked me if he should take a laxative while taking pain med.  I advised a stool softener if he takes it frequently.

## 2011-07-16 ENCOUNTER — Ambulatory Visit (HOSPITAL_COMMUNITY): Payer: Medicare Other

## 2011-07-16 ENCOUNTER — Ambulatory Visit (HOSPITAL_COMMUNITY)
Admission: RE | Admit: 2011-07-16 | Discharge: 2011-07-16 | Disposition: A | Discharge status: Home / Self Care | Payer: Medicare Other | Source: Ambulatory Visit | Attending: General Surgery | Admitting: General Surgery

## 2011-07-16 DIAGNOSIS — Z01812 Encounter for preprocedural laboratory examination: Secondary | ICD-10-CM | POA: Insufficient documentation

## 2011-07-16 DIAGNOSIS — I4891 Unspecified atrial fibrillation: Secondary | ICD-10-CM | POA: Insufficient documentation

## 2011-07-16 DIAGNOSIS — J4489 Other specified chronic obstructive pulmonary disease: Secondary | ICD-10-CM | POA: Insufficient documentation

## 2011-07-16 DIAGNOSIS — Z951 Presence of aortocoronary bypass graft: Secondary | ICD-10-CM | POA: Insufficient documentation

## 2011-07-16 DIAGNOSIS — C189 Malignant neoplasm of colon, unspecified: Secondary | ICD-10-CM | POA: Insufficient documentation

## 2011-07-16 DIAGNOSIS — R011 Cardiac murmur, unspecified: Secondary | ICD-10-CM | POA: Insufficient documentation

## 2011-07-16 DIAGNOSIS — J449 Chronic obstructive pulmonary disease, unspecified: Secondary | ICD-10-CM | POA: Insufficient documentation

## 2011-07-16 HISTORY — PX: PORTACATH PLACEMENT: SHX2246

## 2011-07-18 ENCOUNTER — Other Ambulatory Visit: Payer: Self-pay | Admitting: Oncology

## 2011-07-18 ENCOUNTER — Encounter (HOSPITAL_BASED_OUTPATIENT_CLINIC_OR_DEPARTMENT_OTHER): Payer: Medicare Other | Admitting: Oncology

## 2011-07-18 DIAGNOSIS — C182 Malignant neoplasm of ascending colon: Secondary | ICD-10-CM

## 2011-07-18 DIAGNOSIS — Z5111 Encounter for antineoplastic chemotherapy: Secondary | ICD-10-CM

## 2011-07-18 LAB — CBC WITH DIFFERENTIAL/PLATELET
Eosinophils Absolute: 0.4 10*3/uL (ref 0.0–0.5)
HCT: 38.7 % (ref 38.4–49.9)
LYMPH%: 9 % — ABNORMAL LOW (ref 14.0–49.0)
MCV: 83.2 fL (ref 79.3–98.0)
MONO#: 1 10*3/uL — ABNORMAL HIGH (ref 0.1–0.9)
MONO%: 10.2 % (ref 0.0–14.0)
NEUT#: 7.3 10*3/uL — ABNORMAL HIGH (ref 1.5–6.5)
NEUT%: 76 % — ABNORMAL HIGH (ref 39.0–75.0)
Platelets: 291 10*3/uL (ref 140–400)
RBC: 4.65 10*6/uL (ref 4.20–5.82)
nRBC: 2 % — ABNORMAL HIGH (ref 0–0)

## 2011-07-18 NOTE — Op Note (Signed)
NAME:  Kyle Love, SAR NO.:  [id number]  MEDICAL RECORD NO.:  [id number]  LOCATION:  [facility]                         FACILITY:  [facility]  PHYSICIAN:  Adolph Pollack, M.D.DATE OF BIRTH:  September 25, 1941  DATE OF PROCEDURE:  07/16/2011 DATE OF DISCHARGE:  07/16/2011                              OPERATIVE REPORT   PREOPERATIVE DIAGNOSIS:  Stage IV colon cancer.  POSTOPERATIVE DIAGNOSIS:  Stage IV colon cancer.  PROCEDURE:  Ultrasound-guided Port-A-Cath insertion into right internal jugular vein with fluoroscopy.  SURGEON:  Adolph Pollack, MD  ANESTHESIA:  Local with MAC.  INDICATIONS:  This is a 70 year old male who had a recent right colectomy is known to have stage IV disease.  He needs long-term venous access for chemotherapy and he presents for that now.  The procedure and the risks (including but not limited bleeding, infection, malfunction, wound problems, anesthesia, DVT, pneumothorax) were explained to him preoperatively.  TECHNIQUE:  He was seen in the holding area and brought to the operating room, placed supine on the operating room and given intravenous sedation.  A roll was placed under the back.  The neck and upper chest were sterilely prepped and draped.  Using ultrasound, I identified the right internal jugular vein.  I anesthetized an area in the right neck with local anesthesia.  A small incision was made here.  Using a 16 gauge needle and under ultrasound guidance, I cannulated the right internal jugular vein and placed a wire through the needle into the superior vena cava.  The wire position was verified by fluoroscopy.  I then anesthetized an area in the right upper chest superficially and deep with local anesthesia.  Incision was made in the right upper chest through the skin and subcutaneous tissue.  Using electrocautery, I created a pocket for the port.  I then anesthetized the subcutaneous tract between the neck incision and the  chest incision and tunneled the catheter from the chest incision up to the neck incision.  A dilator introducer complex was then placed over the wire and its position was verified by fluoroscopy.  The dilator and wire were removed and the catheter was threaded through the peel-away sheath introducer.  The introducer was then removed.  Under fluoroscopic guidance, I pulled the catheter tip back until it was in the distal superior vena cava.  At the chest wall incision, the catheter was cut and attached to the port and the port was attached to the chest wall with interrupted 2-0 Vicryl sutures.  The port aspirated blood and flushed easily.  Following this, I then closed the skin wound of the neck with a 4-0 Monocryl subcuticular stitch.  The chest wall wound was closed in 2 layers.  The subcutaneous tissue was approximated with 3-0 Vicryl suture.  The skin was closed with 4-0 Monocryl subcuticular stitch.  The port was then accessed with [facility] needle and flushed and then concentrated solution placed in it.  The needle was left in.  Sterile dressings were applied to both areas.  He tolerated the procedure without any apparent complications and was taken to the recovery in satisfactory condition.  A portable chest x-ray is pending.     Tawanna Cooler  Laurie Panda, M.D.     Kari Baars  D:  07/16/2011  T:  07/16/2011  Job:  [id number]  Electronically Signed by Avel Peace M.D. on 07/18/2011 08:08:04 AM

## 2011-07-19 ENCOUNTER — Encounter: Payer: Medicare Other | Admitting: Oncology

## 2011-07-20 ENCOUNTER — Encounter (HOSPITAL_BASED_OUTPATIENT_CLINIC_OR_DEPARTMENT_OTHER): Payer: Medicare Other | Admitting: Oncology

## 2011-07-20 DIAGNOSIS — C182 Malignant neoplasm of ascending colon: Secondary | ICD-10-CM

## 2011-07-23 ENCOUNTER — Other Ambulatory Visit (INDEPENDENT_AMBULATORY_CARE_PROVIDER_SITE_OTHER): Payer: Self-pay

## 2011-07-23 ENCOUNTER — Telehealth (INDEPENDENT_AMBULATORY_CARE_PROVIDER_SITE_OTHER): Payer: Self-pay

## 2011-07-23 ENCOUNTER — Encounter: Payer: Self-pay | Admitting: Cardiovascular Disease

## 2011-07-23 DIAGNOSIS — R52 Pain, unspecified: Secondary | ICD-10-CM

## 2011-07-23 MED ORDER — HYDROCODONE-ACETAMINOPHEN 10-325 MG PO TABS: ORAL_TABLET | ORAL | Status: DC

## 2011-07-23 NOTE — Telephone Encounter (Signed)
Patient was given Percocet and it makes him nauseated and vomit.  He is vomiting everything he eats.  He is requesting Vicodin which has worked before.  He uses [employer]

## 2011-07-23 NOTE — Telephone Encounter (Signed)
Called in Norco 5/325 #30 to [place] in [place] at [phone].  Pt was experiencing nausea and vomiting with Percocet.  Wife aware.

## 2011-07-27 ENCOUNTER — Encounter: Payer: Self-pay | Admitting: Cardiovascular Disease

## 2011-07-27 ENCOUNTER — Ambulatory Visit (INDEPENDENT_AMBULATORY_CARE_PROVIDER_SITE_OTHER): Payer: Medicare Other | Admitting: Cardiovascular Disease

## 2011-07-27 DIAGNOSIS — Z954 Presence of other heart-valve replacement: Secondary | ICD-10-CM

## 2011-07-27 DIAGNOSIS — I4891 Unspecified atrial fibrillation: Secondary | ICD-10-CM

## 2011-07-27 DIAGNOSIS — C189 Malignant neoplasm of colon, unspecified: Secondary | ICD-10-CM

## 2011-07-27 NOTE — Patient Instructions (Addendum)
Your physician recommends that you schedule a follow-up appointment in:  3 MONTHS WITH  DR NISHAN  Your physician recommends that you continue on your current medications as directed. Please refer to the Current Medication list given to you today.  

## 2011-07-27 NOTE — Progress Notes (Signed)
70 yo with chronic afib and tissue AVR 2008.  Undergoing Rx for metastatic stage 4 colon CA.  Has had lots of issues with anema and bleeding.  After debulking surgery elected to not resume coumadin.  Since hospital D/C weight down 26 lbs.  Has port a cath and is getting chemoRx.  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  He indicates post op PET scan more encouraging.  Has good days and bad days.  Appetite poor.  Wife just had knee surgery with Alusio.  Discussed issues of regulating coumadin during chemo and weight loss and I still prefer to keep him off it.  No SSCP, TIA;s, dyspnea or edema  ROS: Denies fever, malais, weight loss, blurry vision, decreased visual acuity, cough, sputum, SOB, hemoptysis, pleuritic pain, palpitaitons, heartburn, abdominal pain, melena, lower extremity edema, claudication, or rash.  All other systems reviewed and negative  General: Affect appropriate Much thinner chronically ill appearing HEENT: normal Neck supple with no adenopathy JVP normal no bruits no thyromegaly Lungs clear with no wheezing and good diaphragmatic motion Heart:  S1/S2 prom tissue AVR no murmur,rub, gallop or click PMI normal Abdomen: benighn, BS positve, no tenderness, no AAA no bruit.  No HSM or HJR Distal pulses intact with no bruits No edema Neuro non-focal Skin warm and dry No muscular weakness Port cath under right calvicle   Current Outpatient Prescriptions  Medication Sig Dispense Refill  . albuterol (VENTOLIN HFA) 108 (90 BASE) MCG/ACT inhaler Inhale 2 puffs into the lungs every 6 (six) hours as needed.        . ALPRAZolam (XANAX) 0.25 MG tablet Take 0.25 mg by mouth at bedtime as needed.        . Ascorbic Acid (VITAMIN C) 1000 MG tablet Take 1,000 mg by mouth daily.        . cefUROXime (CEFTIN) 500 MG tablet       . Cyanocobalamin (VITAMIN B 12 PO) Take by mouth.        . docusate sodium (COLACE) 100 MG capsule Take 100 mg by mouth 2 (two) times daily.        . folic  acid (FOLVITE) 400 MCG tablet Take 400 mcg by mouth daily.        . furosemide (LASIX) 40 MG tablet Take 40 mg by mouth 2 (two) times daily.        Marland Kitchen HYDROcodone-acetaminophen (NORCO) 10-325 MG per tablet Norco 5/325 #30 w/ no refills  30 tablet  0  . iron polysaccharides (NIFEREX) 150 MG capsule Take 150 mg by mouth 2 (two) times daily.        . metoprolol (TOPROL-XL) 50 MG 24 hr tablet Take 50 mg by mouth daily.        Marland Kitchen oxyCODONE-acetaminophen (ROXICET) 5-325 MG per tablet Take 1 tablet by mouth every 4 (four) hours as needed for pain (every 4 to 6 hours).  50 tablet  0  . pantoprazole (PROTONIX) 40 MG tablet Take 40 mg by mouth daily.        . potassium chloride (KLOR-CON) 10 MEQ CR tablet Take 10 mEq by mouth daily.        . vitamin E 400 UNIT capsule Take 400 Units by mouth daily.        Marland Kitchen warfarin (COUMADIN) 5 MG tablet TAKE 1 TABLET BY MOUTH AS DIRECTED BY COUMADIN CLINIC  60 tablet  2    Allergies  Diltiazem hcl; Penicillins; and Plasma human  Electrocardiogram:  Assessment and Plan

## 2011-07-27 NOTE — Assessment & Plan Note (Signed)
Normal by exam no need for coumadin for valve.  SBE prophylaxis

## 2011-07-27 NOTE — Assessment & Plan Note (Signed)
Good rate control.  Coumadin being held due to weight loss, chemo and gi bleeding

## 2011-07-27 NOTE — Assessment & Plan Note (Signed)
Continue chemo  PET scan 3 months.  F/U Dr Samson Frederic.  Average lifespan for stage 4 metastatic colon CA 2.5 years.

## 2011-08-01 ENCOUNTER — Encounter (HOSPITAL_BASED_OUTPATIENT_CLINIC_OR_DEPARTMENT_OTHER): Payer: Medicare Other | Admitting: Oncology

## 2011-08-01 ENCOUNTER — Other Ambulatory Visit: Payer: Self-pay | Admitting: Oncology

## 2011-08-01 ENCOUNTER — Other Ambulatory Visit (INDEPENDENT_AMBULATORY_CARE_PROVIDER_SITE_OTHER): Payer: Self-pay

## 2011-08-01 DIAGNOSIS — C182 Malignant neoplasm of ascending colon: Secondary | ICD-10-CM

## 2011-08-01 DIAGNOSIS — Z5111 Encounter for antineoplastic chemotherapy: Secondary | ICD-10-CM

## 2011-08-01 DIAGNOSIS — C189 Malignant neoplasm of colon, unspecified: Secondary | ICD-10-CM

## 2011-08-01 LAB — COMPREHENSIVE METABOLIC PANEL
ALT: 173 U/L — ABNORMAL HIGH (ref 0–53)
AST: 94 U/L — ABNORMAL HIGH (ref 0–37)
Alkaline Phosphatase: 118 U/L — ABNORMAL HIGH (ref 39–117)
BUN: 30 mg/dL — ABNORMAL HIGH (ref 6–23)
Calcium: 9.5 mg/dL (ref 8.4–10.5)
Chloride: 108 mEq/L (ref 96–112)
Creatinine, Ser: 1.4 mg/dL — ABNORMAL HIGH (ref 0.50–1.35)
Total Bilirubin: 0.5 mg/dL (ref 0.3–1.2)

## 2011-08-01 LAB — CBC WITH DIFFERENTIAL/PLATELET
BASO%: 0.3 % (ref 0.0–2.0)
Basophils Absolute: 0 10*3/uL (ref 0.0–0.1)
EOS%: 2.7 % (ref 0.0–7.0)
HCT: 36.5 % — ABNORMAL LOW (ref 38.4–49.9)
HGB: 12.3 g/dL — ABNORMAL LOW (ref 13.0–17.1)
LYMPH%: 16.2 % (ref 14.0–49.0)
MCH: 28.6 pg (ref 27.2–33.4)
MCHC: 33.6 g/dL (ref 32.0–36.0)
MCV: 85.3 fL (ref 79.3–98.0)
MONO%: 10.1 % (ref 0.0–14.0)
NEUT%: 70.7 % (ref 39.0–75.0)
Platelets: 300 10*3/uL (ref 140–400)
lymph#: 0.7 10*3/uL — ABNORMAL LOW (ref 0.9–3.3)

## 2011-08-01 MED ORDER — HYDROCODONE-ACETAMINOPHEN 5-325 MG PO TABS: 1.0000 | ORAL_TABLET | Freq: Four times a day (QID) | ORAL | Status: AC | PRN

## 2011-08-02 ENCOUNTER — Other Ambulatory Visit: Payer: Self-pay | Admitting: Oncology

## 2011-08-02 ENCOUNTER — Encounter: Payer: Medicare Other | Admitting: Oncology

## 2011-08-03 ENCOUNTER — Other Ambulatory Visit: Payer: Self-pay | Admitting: Oncology

## 2011-08-03 ENCOUNTER — Encounter (HOSPITAL_BASED_OUTPATIENT_CLINIC_OR_DEPARTMENT_OTHER): Payer: Medicare Other | Admitting: Oncology

## 2011-08-03 DIAGNOSIS — C182 Malignant neoplasm of ascending colon: Secondary | ICD-10-CM

## 2011-08-03 LAB — BASIC METABOLIC PANEL
BUN: 27 mg/dL — ABNORMAL HIGH (ref 6–23)
CO2: 22 mEq/L (ref 19–32)
Chloride: 106 mEq/L (ref 96–112)
Creatinine, Ser: 0.96 mg/dL (ref 0.50–1.35)
Potassium: 3.9 mEq/L (ref 3.5–5.3)

## 2011-08-07 ENCOUNTER — Telehealth (INDEPENDENT_AMBULATORY_CARE_PROVIDER_SITE_OTHER): Payer: Self-pay

## 2011-08-07 NOTE — Telephone Encounter (Signed)
Pt's wife called to report that the pt has been taking a morning dose of Protonix, and generic acid reducer in the p.m.  He is still having a great deal of discomfort; burning, indigestion, and lack of appetite.  Is there something else we can recommend.  He is not eating because of the discomfort and is still losing weight.

## 2011-08-13 ENCOUNTER — Encounter (INDEPENDENT_AMBULATORY_CARE_PROVIDER_SITE_OTHER): Payer: Self-pay | Admitting: General Surgery

## 2011-08-13 ENCOUNTER — Ambulatory Visit (INDEPENDENT_AMBULATORY_CARE_PROVIDER_SITE_OTHER): Payer: Medicare Other | Admitting: General Surgery

## 2011-08-13 DIAGNOSIS — C182 Malignant neoplasm of ascending colon: Secondary | ICD-10-CM

## 2011-08-13 NOTE — Progress Notes (Signed)
Operation: Right colectomy and cholecystectomy  Date:  June 08, 2011  Pathology:  Stage IV  HPI:  He is here for another postoperative visit. He's feeling much better. He is tolerating his chemotherapy fairly well-appearing his appetite has improved. He is gained some weight. He's been having some heartburn. Carafate is helping this. Bowel movements are sporadic.   Physical Exam:  General-looks much better.  Abdomen-soft, well-healed midline and small incisions, no hernia, no mass.   Assessment:  Stage IV colon cancer status post right colectomy-and doing much better overall. Currently undergoing chemotherapy Plan:  Activities as tolerated. Return visit p.r.n.

## 2011-08-13 NOTE — Patient Instructions (Signed)
Avoid caffeine, chocolate, nicotine, peppermint, citrus fruits or juice, tomatoes. Activities as tolerated.

## 2011-08-15 ENCOUNTER — Encounter (HOSPITAL_BASED_OUTPATIENT_CLINIC_OR_DEPARTMENT_OTHER): Payer: Medicare Other | Admitting: Oncology

## 2011-08-15 ENCOUNTER — Other Ambulatory Visit: Payer: Self-pay | Admitting: Oncology

## 2011-08-15 DIAGNOSIS — C182 Malignant neoplasm of ascending colon: Secondary | ICD-10-CM

## 2011-08-15 DIAGNOSIS — Z5111 Encounter for antineoplastic chemotherapy: Secondary | ICD-10-CM

## 2011-08-15 LAB — CBC WITH DIFFERENTIAL/PLATELET
Basophils Absolute: 0 10*3/uL (ref 0.0–0.1)
EOS%: 1.9 % (ref 0.0–7.0)
Eosinophils Absolute: 0.1 10*3/uL (ref 0.0–0.5)
LYMPH%: 29.8 % (ref 14.0–49.0)
MCH: 28.1 pg (ref 27.2–33.4)
MCV: 82.2 fL (ref 79.3–98.0)
MONO%: 12.1 % (ref 0.0–14.0)
NEUT#: 1.8 10*3/uL (ref 1.5–6.5)
Platelets: 156 10*3/uL (ref 140–400)
RBC: 3.98 10*6/uL — ABNORMAL LOW (ref 4.20–5.82)

## 2011-08-15 LAB — COMPREHENSIVE METABOLIC PANEL
Alkaline Phosphatase: 126 U/L — ABNORMAL HIGH (ref 39–117)
CO2: 22 mEq/L (ref 19–32)
Creatinine, Ser: 0.81 mg/dL (ref 0.50–1.35)
Glucose, Bld: 107 mg/dL — ABNORMAL HIGH (ref 70–99)
Total Bilirubin: 0.3 mg/dL (ref 0.3–1.2)

## 2011-08-16 ENCOUNTER — Encounter: Payer: Medicare Other | Admitting: Oncology

## 2011-08-17 ENCOUNTER — Encounter (HOSPITAL_BASED_OUTPATIENT_CLINIC_OR_DEPARTMENT_OTHER): Payer: Medicare Other | Admitting: Oncology

## 2011-08-17 DIAGNOSIS — C182 Malignant neoplasm of ascending colon: Secondary | ICD-10-CM

## 2011-08-29 ENCOUNTER — Other Ambulatory Visit: Payer: Self-pay | Admitting: Oncology

## 2011-08-29 ENCOUNTER — Encounter (HOSPITAL_BASED_OUTPATIENT_CLINIC_OR_DEPARTMENT_OTHER): Payer: Medicare Other | Admitting: Oncology

## 2011-08-29 DIAGNOSIS — C182 Malignant neoplasm of ascending colon: Secondary | ICD-10-CM

## 2011-08-29 DIAGNOSIS — Z5111 Encounter for antineoplastic chemotherapy: Secondary | ICD-10-CM

## 2011-08-29 DIAGNOSIS — R109 Unspecified abdominal pain: Secondary | ICD-10-CM

## 2011-08-29 DIAGNOSIS — I4891 Unspecified atrial fibrillation: Secondary | ICD-10-CM

## 2011-08-29 DIAGNOSIS — B370 Candidal stomatitis: Secondary | ICD-10-CM

## 2011-08-29 LAB — CBC WITH DIFFERENTIAL/PLATELET
Basophils Absolute: 0 10*3/uL (ref 0.0–0.1)
Eosinophils Absolute: 0.1 10*3/uL (ref 0.0–0.5)
HCT: 30 % — ABNORMAL LOW (ref 38.4–49.9)
HGB: 10.2 g/dL — ABNORMAL LOW (ref 13.0–17.1)
LYMPH%: 14.7 % (ref 14.0–49.0)
MCV: 88.6 fL (ref 79.3–98.0)
MONO#: 0.8 10*3/uL (ref 0.1–0.9)
NEUT#: 3.8 10*3/uL (ref 1.5–6.5)
NEUT%: 68.8 % (ref 39.0–75.0)
Platelets: 181 10*3/uL (ref 140–400)
WBC: 5.5 10*3/uL (ref 4.0–10.3)

## 2011-08-29 LAB — COMPREHENSIVE METABOLIC PANEL
BUN: 14 mg/dL (ref 6–23)
CO2: 26 mEq/L (ref 19–32)
Creatinine, Ser: 0.96 mg/dL (ref 0.50–1.35)
Glucose, Bld: 113 mg/dL — ABNORMAL HIGH (ref 70–99)
Sodium: 139 mEq/L (ref 135–145)
Total Bilirubin: 0.4 mg/dL (ref 0.3–1.2)
Total Protein: 6.6 g/dL (ref 6.0–8.3)

## 2011-08-31 ENCOUNTER — Encounter (HOSPITAL_BASED_OUTPATIENT_CLINIC_OR_DEPARTMENT_OTHER): Payer: Medicare Other | Admitting: Oncology

## 2011-08-31 DIAGNOSIS — C182 Malignant neoplasm of ascending colon: Secondary | ICD-10-CM

## 2011-09-12 ENCOUNTER — Other Ambulatory Visit: Payer: Self-pay | Admitting: Oncology

## 2011-09-12 ENCOUNTER — Encounter (HOSPITAL_BASED_OUTPATIENT_CLINIC_OR_DEPARTMENT_OTHER): Payer: Medicare Other | Admitting: Oncology

## 2011-09-12 DIAGNOSIS — C182 Malignant neoplasm of ascending colon: Secondary | ICD-10-CM

## 2011-09-12 DIAGNOSIS — I4891 Unspecified atrial fibrillation: Secondary | ICD-10-CM

## 2011-09-12 DIAGNOSIS — Z5111 Encounter for antineoplastic chemotherapy: Secondary | ICD-10-CM

## 2011-09-12 DIAGNOSIS — R109 Unspecified abdominal pain: Secondary | ICD-10-CM

## 2011-09-12 DIAGNOSIS — B370 Candidal stomatitis: Secondary | ICD-10-CM

## 2011-09-12 LAB — COMPREHENSIVE METABOLIC PANEL
Albumin: 3.1 g/dL — ABNORMAL LOW (ref 3.5–5.2)
CO2: 29 mEq/L (ref 19–32)
Glucose, Bld: 147 mg/dL — ABNORMAL HIGH (ref 70–99)
Potassium: 3.8 mEq/L (ref 3.5–5.3)
Sodium: 136 mEq/L (ref 135–145)
Total Bilirubin: 0.7 mg/dL (ref 0.3–1.2)
Total Protein: 6.7 g/dL (ref 6.0–8.3)

## 2011-09-12 LAB — CBC WITH DIFFERENTIAL/PLATELET
Eosinophils Absolute: 0.1 10*3/uL (ref 0.0–0.5)
HGB: 10.1 g/dL — ABNORMAL LOW (ref 13.0–17.1)
LYMPH%: 6.3 % — ABNORMAL LOW (ref 14.0–49.0)
MONO#: 0.8 10*3/uL (ref 0.1–0.9)
NEUT#: 9.7 10*3/uL — ABNORMAL HIGH (ref 1.5–6.5)
Platelets: 153 10*3/uL (ref 140–400)
RBC: 3.21 10*6/uL — ABNORMAL LOW (ref 4.20–5.82)
WBC: 11.3 10*3/uL — ABNORMAL HIGH (ref 4.0–10.3)

## 2011-09-13 ENCOUNTER — Encounter: Payer: Medicare Other | Admitting: Oncology

## 2011-09-14 ENCOUNTER — Encounter (HOSPITAL_BASED_OUTPATIENT_CLINIC_OR_DEPARTMENT_OTHER): Payer: Medicare Other | Admitting: Oncology

## 2011-09-14 ENCOUNTER — Other Ambulatory Visit: Payer: Self-pay | Admitting: Oncology

## 2011-09-14 ENCOUNTER — Ambulatory Visit (HOSPITAL_COMMUNITY)
Admission: RE | Admit: 2011-09-14 | Discharge: 2011-09-14 | Disposition: A | Discharge status: Home / Self Care | Payer: Medicare Other | Source: Ambulatory Visit | Attending: Oncology | Admitting: Oncology

## 2011-09-14 DIAGNOSIS — R109 Unspecified abdominal pain: Secondary | ICD-10-CM

## 2011-09-14 DIAGNOSIS — J189 Pneumonia, unspecified organism: Secondary | ICD-10-CM

## 2011-09-14 DIAGNOSIS — R05 Cough: Secondary | ICD-10-CM

## 2011-09-14 DIAGNOSIS — R509 Fever, unspecified: Secondary | ICD-10-CM | POA: Insufficient documentation

## 2011-09-14 DIAGNOSIS — B370 Candidal stomatitis: Secondary | ICD-10-CM

## 2011-09-14 DIAGNOSIS — Z85038 Personal history of other malignant neoplasm of large intestine: Secondary | ICD-10-CM | POA: Insufficient documentation

## 2011-09-14 DIAGNOSIS — I771 Stricture of artery: Secondary | ICD-10-CM | POA: Insufficient documentation

## 2011-09-14 DIAGNOSIS — R059 Cough, unspecified: Secondary | ICD-10-CM | POA: Insufficient documentation

## 2011-09-14 DIAGNOSIS — C182 Malignant neoplasm of ascending colon: Secondary | ICD-10-CM

## 2011-09-14 DIAGNOSIS — J984 Other disorders of lung: Secondary | ICD-10-CM | POA: Insufficient documentation

## 2011-09-14 DIAGNOSIS — I4891 Unspecified atrial fibrillation: Secondary | ICD-10-CM

## 2011-09-14 DIAGNOSIS — R0989 Other specified symptoms and signs involving the circulatory and respiratory systems: Secondary | ICD-10-CM | POA: Insufficient documentation

## 2011-09-14 LAB — PROTIME-INR: INR: 2.6 — ABNORMAL HIGH

## 2011-09-14 LAB — URINALYSIS, MICROSCOPIC - CHCC
Bilirubin (Urine): NEGATIVE
Glucose: NEGATIVE g/dL
Specific Gravity, Urine: 1.015 (ref 1.003–1.035)
pH: 6 (ref 4.6–8.0)

## 2011-09-14 LAB — BASIC METABOLIC PANEL
Calcium: 8.7
GFR calc Af Amer: >60
GFR calc non Af Amer: >60
Sodium: 137

## 2011-09-14 LAB — APTT: aPTT: 39 — ABNORMAL HIGH

## 2011-09-14 LAB — CBC
Hemoglobin: 13
RBC: 4.42

## 2011-09-15 ENCOUNTER — Encounter (HOSPITAL_BASED_OUTPATIENT_CLINIC_OR_DEPARTMENT_OTHER): Payer: Medicare Other | Admitting: Oncology

## 2011-09-15 DIAGNOSIS — R059 Cough, unspecified: Secondary | ICD-10-CM

## 2011-09-15 DIAGNOSIS — R05 Cough: Secondary | ICD-10-CM

## 2011-09-15 DIAGNOSIS — R509 Fever, unspecified: Secondary | ICD-10-CM

## 2011-09-16 ENCOUNTER — Encounter (HOSPITAL_BASED_OUTPATIENT_CLINIC_OR_DEPARTMENT_OTHER): Payer: Medicare Other | Admitting: Oncology

## 2011-09-16 DIAGNOSIS — R509 Fever, unspecified: Secondary | ICD-10-CM

## 2011-09-16 DIAGNOSIS — R05 Cough: Secondary | ICD-10-CM

## 2011-09-17 ENCOUNTER — Telehealth: Payer: Self-pay | Admitting: Cardiovascular Disease

## 2011-09-17 NOTE — Telephone Encounter (Signed)
Metroprolol-tart 50 mg. Instructions say take one tablet po/day. Pt said he takes 75 mg 2x/day po. Please return pt call to discuss further. Pt needs new RX and/or more tablets called in.   [facility]  [id number] # [phone]

## 2011-09-18 ENCOUNTER — Telehealth: Payer: Self-pay | Admitting: Cardiovascular Disease

## 2011-09-18 MED ORDER — METOPROLOL TARTRATE 50 MG PO TABS: ORAL_TABLET | ORAL | Status: DC

## 2011-09-18 MED ORDER — METOPROLOL SUCCINATE ER 50 MG PO TB24: 50.0000 mg | ORAL_TABLET | Freq: Two times a day (BID) | ORAL | Status: DC

## 2011-09-18 NOTE — Telephone Encounter (Signed)
Please call back about rx that was called in yesterday

## 2011-09-21 LAB — CULTURE, BLOOD (SINGLE)

## 2011-09-26 ENCOUNTER — Encounter (HOSPITAL_BASED_OUTPATIENT_CLINIC_OR_DEPARTMENT_OTHER): Payer: Medicare Other | Admitting: Oncology

## 2011-09-26 ENCOUNTER — Other Ambulatory Visit: Payer: Self-pay | Admitting: Oncology

## 2011-09-26 DIAGNOSIS — B370 Candidal stomatitis: Secondary | ICD-10-CM

## 2011-09-26 DIAGNOSIS — R109 Unspecified abdominal pain: Secondary | ICD-10-CM

## 2011-09-26 DIAGNOSIS — I4891 Unspecified atrial fibrillation: Secondary | ICD-10-CM

## 2011-09-26 DIAGNOSIS — Z5111 Encounter for antineoplastic chemotherapy: Secondary | ICD-10-CM

## 2011-09-26 DIAGNOSIS — C182 Malignant neoplasm of ascending colon: Secondary | ICD-10-CM

## 2011-09-26 DIAGNOSIS — C189 Malignant neoplasm of colon, unspecified: Secondary | ICD-10-CM

## 2011-09-26 LAB — COMPREHENSIVE METABOLIC PANEL
ALT: 10 U/L (ref 0–53)
AST: 21 U/L (ref 0–37)
Chloride: 104 mEq/L (ref 96–112)
Creatinine, Ser: 0.89 mg/dL (ref 0.50–1.35)
Sodium: 139 mEq/L (ref 135–145)
Total Bilirubin: 0.8 mg/dL (ref 0.3–1.2)
Total Protein: 6.7 g/dL (ref 6.0–8.3)

## 2011-09-26 LAB — CBC WITH DIFFERENTIAL/PLATELET
BASO%: 0.6 % (ref 0.0–2.0)
Eosinophils Absolute: 0.1 10*3/uL (ref 0.0–0.5)
MCHC: 33.1 g/dL (ref 32.0–36.0)
MONO#: 0.6 10*3/uL (ref 0.1–0.9)
NEUT#: 1.8 10*3/uL (ref 1.5–6.5)
RBC: 3.12 10*6/uL — ABNORMAL LOW (ref 4.20–5.82)
RDW: 23.5 % — ABNORMAL HIGH (ref 11.0–14.6)
WBC: 3.3 10*3/uL — ABNORMAL LOW (ref 4.0–10.3)
lymph#: 0.7 10*3/uL — ABNORMAL LOW (ref 0.9–3.3)
nRBC: 0 % (ref 0–0)

## 2011-09-26 LAB — URINALYSIS, MICROSCOPIC - CHCC
Bilirubin (Urine): NEGATIVE
Blood: NEGATIVE
Glucose: NEGATIVE g/dL
Leukocyte Esterase: NEGATIVE
Nitrite: NEGATIVE
Specific Gravity, Urine: 1.02 (ref 1.003–1.035)

## 2011-09-28 ENCOUNTER — Encounter (HOSPITAL_BASED_OUTPATIENT_CLINIC_OR_DEPARTMENT_OTHER): Payer: Medicare Other | Admitting: Oncology

## 2011-09-28 DIAGNOSIS — C182 Malignant neoplasm of ascending colon: Secondary | ICD-10-CM

## 2011-10-23 ENCOUNTER — Telehealth: Payer: Self-pay | Admitting: Cardiovascular Disease

## 2011-10-23 NOTE — Telephone Encounter (Signed)
Spoke with pt, [place] and his bp has been running high. It has ranged from 156-174/78-92. He is unable to climb the stairs or move throughout the house without getting very SOB. He denies edema more than usual. He has not taken his lasix for about one month. He will take 40 mg of lasix now and will see dr [name] tomorrow at 11:30am Deliah Goody

## 2011-10-23 NOTE — Telephone Encounter (Signed)
Pt calling back again please call

## 2011-10-23 NOTE — Telephone Encounter (Signed)
Pt's wife is calling back again

## 2011-10-23 NOTE — Telephone Encounter (Signed)
Pt calling re some sob, BP running high, 174/101 just checked @ noon

## 2011-10-24 ENCOUNTER — Telehealth: Payer: Self-pay | Admitting: Cardiovascular Disease

## 2011-10-24 ENCOUNTER — Encounter (HOSPITAL_BASED_OUTPATIENT_CLINIC_OR_DEPARTMENT_OTHER): Payer: Medicare Other | Admitting: Oncology

## 2011-10-24 ENCOUNTER — Ambulatory Visit (INDEPENDENT_AMBULATORY_CARE_PROVIDER_SITE_OTHER): Payer: Medicare Other | Admitting: Cardiovascular Disease

## 2011-10-24 ENCOUNTER — Ambulatory Visit (HOSPITAL_COMMUNITY)
Admission: RE | Admit: 2011-10-24 | Discharge: 2011-10-24 | Disposition: A | Discharge status: Home / Self Care | Payer: Medicare Other | Source: Ambulatory Visit | Attending: Oncology | Admitting: Oncology

## 2011-10-24 DIAGNOSIS — R0602 Shortness of breath: Secondary | ICD-10-CM

## 2011-10-24 DIAGNOSIS — I700 Atherosclerosis of aorta: Secondary | ICD-10-CM | POA: Insufficient documentation

## 2011-10-24 DIAGNOSIS — C189 Malignant neoplasm of colon, unspecified: Secondary | ICD-10-CM

## 2011-10-24 DIAGNOSIS — C787 Secondary malignant neoplasm of liver and intrahepatic bile duct: Secondary | ICD-10-CM

## 2011-10-24 DIAGNOSIS — J90 Pleural effusion, not elsewhere classified: Secondary | ICD-10-CM | POA: Insufficient documentation

## 2011-10-24 DIAGNOSIS — D509 Iron deficiency anemia, unspecified: Secondary | ICD-10-CM

## 2011-10-24 DIAGNOSIS — Q619 Cystic kidney disease, unspecified: Secondary | ICD-10-CM | POA: Insufficient documentation

## 2011-10-24 DIAGNOSIS — I359 Nonrheumatic aortic valve disorder, unspecified: Secondary | ICD-10-CM

## 2011-10-24 DIAGNOSIS — C772 Secondary and unspecified malignant neoplasm of intra-abdominal lymph nodes: Secondary | ICD-10-CM

## 2011-10-24 DIAGNOSIS — C182 Malignant neoplasm of ascending colon: Secondary | ICD-10-CM

## 2011-10-24 DIAGNOSIS — I25810 Atherosclerosis of coronary artery bypass graft(s) without angina pectoris: Secondary | ICD-10-CM

## 2011-10-24 DIAGNOSIS — N200 Calculus of kidney: Secondary | ICD-10-CM | POA: Insufficient documentation

## 2011-10-24 DIAGNOSIS — Z954 Presence of other heart-valve replacement: Secondary | ICD-10-CM

## 2011-10-24 DIAGNOSIS — I4891 Unspecified atrial fibrillation: Secondary | ICD-10-CM

## 2011-10-24 LAB — BASIC METABOLIC PANEL
Calcium: 9 mg/dL (ref 8.4–10.5)
Creatinine, Ser: 1 mg/dL (ref 0.4–1.5)
GFR: 78.96 mL/min (ref >60.00)
Sodium: 137 mEq/L (ref 135–145)

## 2011-10-24 LAB — CBC WITH DIFFERENTIAL/PLATELET
Basophils Relative: 0.2 % (ref 0.0–3.0)
Eosinophils Absolute: 0.2 10*3/uL (ref 0.0–0.7)
Eosinophils Relative: 1.8 % (ref 0.0–5.0)
Lymphocytes Relative: 10.7 % — ABNORMAL LOW (ref 12.0–46.0)
MCHC: 32.9 g/dL (ref 30.0–36.0)
MCV: 98.7 fl (ref 78.0–100.0)
Monocytes Absolute: 0.9 10*3/uL (ref 0.1–1.0)
Neutrophils Relative %: 76.4 % (ref 43.0–77.0)
Platelets: 310 10*3/uL (ref 150.0–400.0)
RBC: 3.46 Mil/uL — ABNORMAL LOW (ref 4.22–5.81)
WBC: 8.7 10*3/uL (ref 4.5–10.5)

## 2011-10-24 LAB — TSH: TSH: 1.64 u[IU]/mL (ref 0.35–5.50)

## 2011-10-24 MED ORDER — IOHEXOL 300 MG/ML  SOLN
100.0000 mL | Freq: Once | INTRAMUSCULAR | Status: AC | PRN
  Administered 2011-10-24: 100 mL via INTRAVENOUS

## 2011-10-24 MED ORDER — FUROSEMIDE 20 MG PO TABS: 20.0000 mg | ORAL_TABLET | Freq: Every day | ORAL | Status: DC

## 2011-10-24 NOTE — Patient Instructions (Addendum)
   Your physician has requested that you have an echocardiogram DX 786.05 SOB. Echocardiography is a painless test that uses sound waves to create images of your heart. It provides your doctor with information about the size and shape of your heart and how well your heart's chambers and valves are working. This procedure takes approximately one hour. There are no restrictions for this procedure.  Your physician recommends that you return for lab work in: TODAY BMET, BNP, CBC W/DIFF, TSH 786.05 SOB, 780.9 ANEMIA  Your physician recommends that you schedule a follow-up appointment in: 11/16/11 @ 10:45  WITH DR. [name]

## 2011-10-24 NOTE — Telephone Encounter (Signed)
pt's wife calling re question on dosage on pt's med requested he take at visit today

## 2011-10-24 NOTE — Telephone Encounter (Signed)
Spoke with pt, questions answered Kyle Love  

## 2011-10-24 NOTE — Telephone Encounter (Deleted)
Pt's wife has question re dosage of med told to take at appt today

## 2011-10-24 NOTE — Progress Notes (Signed)
70 yo with chronic afib and tissue AVR 2008. Undergoing Rx for metastatic stage 4 colon CA. Has had lots of issues with anema and bleeding. After debulking surgery elected to not resume coumadin. Since hospital D/C weight down 26 lbs. Has port a cath and is getting chemoRx. 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 He indicates post op PET scan more encouraging. Has good days and bad days. Appetite poor. Wife just had knee surgery with Alusio. Discussed issues of regulating coumadin during chemo and weight loss and I still prefer to keep him off it. No SSCP, TIA;s, dyspnea or edema  More dyspnea.  No cough.  No SSCP. No palpitations PND or orthopnea  Reviewed Labs:  Hct steadily going down last 8 weeks from 36.5 to 28.4 Echo 1/12 EF 55-65%  Mean gradient 6 no AR AVR normal mild to moderate MR  ROS: Denies fever, malais, weight loss, blurry vision, decreased visual acuity, cough, sputum, SOB, hemoptysis, pleuritic pain, palpitaitons, heartburn, abdominal pain, melena, lower extremity edema, claudication, or rash.  All other systems reviewed and negative  General: Affect appropriate Healthy:  appears stated age HEENT: normal Neck supple with no adenopathy JVP normal no bruits no thyromegaly Lungs clear with no wheezing and good diaphragmatic motion Heart:  S1/S2 no murmur,rub, gallop or click PMI normal Abdomen: benighn, BS positve, no tenderness, no AAA no bruit.  No HSM or HJR Distal pulses intact with no bruits No edema Neuro non-focal Skin warm and dry No muscular weakness   Current Outpatient Prescriptions  Medication Sig Dispense Refill  . albuterol (VENTOLIN HFA) 108 (90 BASE) MCG/ACT inhaler Inhale 2 puffs into the lungs every 6 (six) hours as needed.        . ALPRAZolam (XANAX) 0.25 MG tablet Take 0.25 mg by mouth at bedtime as needed.        . Ascorbic Acid (VITAMIN C) 1000 MG tablet Take 1,000 mg by mouth daily.        Marland Kitchen CARAFATE 1 GM/10ML suspension BID times 48H.        Marland Kitchen Cyanocobalamin (VITAMIN B 12 PO) Take by mouth.        . docusate sodium (COLACE) 100 MG capsule Take 100 mg by mouth 2 (two) times daily.        . folic acid (FOLVITE) 400 MCG tablet Take 400 mcg by mouth daily.        . furosemide (LASIX) 20 MG tablet Take 1 tablet (20 mg total) by mouth daily.  30 tablet  12  . HYDROcodone-acetaminophen (NORCO) 10-325 MG per tablet Norco 5/325 #30 w/ no refills  30 tablet  0  . lidocaine-prilocaine (EMLA) cream Ad lib.      . metoprolol (LOPRESSOR) 50 MG tablet 1 and 1/2 tablet twice daily  270 tablet  4  . mirtazapine (REMERON) 15 MG tablet       . pantoprazole (PROTONIX) 40 MG tablet Take 40 mg by mouth daily.        . potassium chloride (KLOR-CON) 10 MEQ CR tablet Take 10 mEq by mouth daily.        . vitamin E 400 UNIT capsule Take 400 Units by mouth daily.        Marland Kitchen zolpidem (AMBIEN) 5 MG tablet Ad lib.        Allergies  Diltiazem hcl; Penicillins; and Plasma human  Electrocardiogram:  Assessment and Plan

## 2011-10-25 ENCOUNTER — Other Ambulatory Visit (INDEPENDENT_AMBULATORY_CARE_PROVIDER_SITE_OTHER): Payer: Self-pay | Admitting: General Surgery

## 2011-10-25 NOTE — Assessment & Plan Note (Signed)
Stable No coumadin due to colon cancer and anemia

## 2011-10-25 NOTE — Assessment & Plan Note (Signed)
Stage 4 Overall prognosis poor but looks good.  Has port a cath.  Follow blood counts with chemo

## 2011-10-25 NOTE — Assessment & Plan Note (Signed)
May be related to dyspnea.  Hct/Hb today

## 2011-10-25 NOTE — Assessment & Plan Note (Signed)
COPD and anemia.  Check CXR and echo

## 2011-10-25 NOTE — Assessment & Plan Note (Signed)
Tissue valve with normal exam No AR audible

## 2011-10-25 NOTE — Assessment & Plan Note (Signed)
No angina stable

## 2011-10-28 DIAGNOSIS — Z9221 Personal history of antineoplastic chemotherapy: Secondary | ICD-10-CM

## 2011-10-28 DIAGNOSIS — R509 Fever, unspecified: Secondary | ICD-10-CM | POA: Diagnosis present

## 2011-10-28 DIAGNOSIS — D509 Iron deficiency anemia, unspecified: Secondary | ICD-10-CM | POA: Diagnosis present

## 2011-10-28 DIAGNOSIS — N390 Urinary tract infection, site not specified: Secondary | ICD-10-CM | POA: Diagnosis present

## 2011-10-28 DIAGNOSIS — B961 Klebsiella pneumoniae [K. pneumoniae] as the cause of diseases classified elsewhere: Secondary | ICD-10-CM | POA: Diagnosis present

## 2011-10-28 DIAGNOSIS — C189 Malignant neoplasm of colon, unspecified: Secondary | ICD-10-CM | POA: Diagnosis present

## 2011-10-28 DIAGNOSIS — I4891 Unspecified atrial fibrillation: Secondary | ICD-10-CM | POA: Diagnosis present

## 2011-10-28 DIAGNOSIS — E876 Hypokalemia: Secondary | ICD-10-CM | POA: Diagnosis present

## 2011-10-28 DIAGNOSIS — K5900 Constipation, unspecified: Secondary | ICD-10-CM | POA: Diagnosis present

## 2011-10-28 DIAGNOSIS — E871 Hypo-osmolality and hyponatremia: Secondary | ICD-10-CM | POA: Diagnosis present

## 2011-10-28 DIAGNOSIS — D72829 Elevated white blood cell count, unspecified: Secondary | ICD-10-CM | POA: Diagnosis present

## 2011-10-28 DIAGNOSIS — J449 Chronic obstructive pulmonary disease, unspecified: Secondary | ICD-10-CM | POA: Diagnosis present

## 2011-10-28 DIAGNOSIS — Z87891 Personal history of nicotine dependence: Secondary | ICD-10-CM

## 2011-10-28 DIAGNOSIS — J4489 Other specified chronic obstructive pulmonary disease: Secondary | ICD-10-CM | POA: Diagnosis present

## 2011-10-28 DIAGNOSIS — J189 Pneumonia, unspecified organism: Principal | ICD-10-CM | POA: Diagnosis present

## 2011-10-29 ENCOUNTER — Inpatient Hospital Stay (HOSPITAL_COMMUNITY)
Admission: EM | Admit: 2011-10-29 | Discharge: 2011-11-05 | DRG: 194 | Disposition: A | Discharge status: Home / Self Care | Payer: Medicare Other | Attending: Internal Medicine | Admitting: Internal Medicine

## 2011-10-29 ENCOUNTER — Telehealth: Payer: Self-pay | Admitting: *Deleted

## 2011-10-29 ENCOUNTER — Other Ambulatory Visit: Payer: Self-pay

## 2011-10-29 ENCOUNTER — Emergency Department (HOSPITAL_COMMUNITY): Payer: Medicare Other

## 2011-10-29 ENCOUNTER — Telehealth: Payer: Self-pay | Admitting: Cardiovascular Disease

## 2011-10-29 ENCOUNTER — Encounter (HOSPITAL_COMMUNITY): Payer: Self-pay

## 2011-10-29 DIAGNOSIS — J189 Pneumonia, unspecified organism: Secondary | ICD-10-CM | POA: Diagnosis present

## 2011-10-29 DIAGNOSIS — N390 Urinary tract infection, site not specified: Secondary | ICD-10-CM | POA: Diagnosis present

## 2011-10-29 DIAGNOSIS — E871 Hypo-osmolality and hyponatremia: Secondary | ICD-10-CM | POA: Diagnosis present

## 2011-10-29 DIAGNOSIS — K5900 Constipation, unspecified: Secondary | ICD-10-CM | POA: Diagnosis present

## 2011-10-29 DIAGNOSIS — I25810 Atherosclerosis of coronary artery bypass graft(s) without angina pectoris: Secondary | ICD-10-CM

## 2011-10-29 DIAGNOSIS — I4891 Unspecified atrial fibrillation: Secondary | ICD-10-CM | POA: Diagnosis present

## 2011-10-29 DIAGNOSIS — J4489 Other specified chronic obstructive pulmonary disease: Secondary | ICD-10-CM | POA: Diagnosis present

## 2011-10-29 DIAGNOSIS — D509 Iron deficiency anemia, unspecified: Secondary | ICD-10-CM | POA: Diagnosis present

## 2011-10-29 DIAGNOSIS — J449 Chronic obstructive pulmonary disease, unspecified: Secondary | ICD-10-CM | POA: Diagnosis present

## 2011-10-29 DIAGNOSIS — A419 Sepsis, unspecified organism: Secondary | ICD-10-CM

## 2011-10-29 DIAGNOSIS — D72829 Elevated white blood cell count, unspecified: Secondary | ICD-10-CM | POA: Diagnosis present

## 2011-10-29 DIAGNOSIS — E876 Hypokalemia: Secondary | ICD-10-CM | POA: Diagnosis not present

## 2011-10-29 DIAGNOSIS — R52 Pain, unspecified: Secondary | ICD-10-CM

## 2011-10-29 HISTORY — DX: Unspecified osteoarthritis, unspecified site: M19.90

## 2011-10-29 HISTORY — DX: Pneumonia, unspecified organism: J18.9

## 2011-10-29 HISTORY — DX: Personal history of colon polyps, unspecified: Z86.0100

## 2011-10-29 HISTORY — DX: Shortness of breath: R06.02

## 2011-10-29 HISTORY — DX: Personal history of colonic polyps: Z86.010

## 2011-10-29 LAB — CREATININE, SERUM
Creatinine, Ser: 1.08 mg/dL (ref 0.50–1.35)
GFR calc Af Amer: 77 mL/min — ABNORMAL LOW (ref >90)
GFR calc non Af Amer: 67 mL/min — ABNORMAL LOW (ref >90)

## 2011-10-29 LAB — URINALYSIS, ROUTINE W REFLEX MICROSCOPIC
Glucose, UA: NEGATIVE mg/dL
Ketones, ur: NEGATIVE mg/dL
pH: 6 (ref 5.0–8.0)

## 2011-10-29 LAB — CBC
MCH: 31.8 pg (ref 26.0–34.0)
MCHC: 32.6 g/dL (ref 30.0–36.0)
MCHC: 33.3 g/dL (ref 30.0–36.0)
Platelets: 218 10*3/uL (ref 150–400)
RDW: 18.1 % — ABNORMAL HIGH (ref 11.5–15.5)
RDW: 18 % — ABNORMAL HIGH (ref 11.5–15.5)
WBC: 13.1 10*3/uL — ABNORMAL HIGH (ref 4.0–10.5)

## 2011-10-29 LAB — COMPREHENSIVE METABOLIC PANEL
ALT: 9 U/L (ref 0–53)
AST: 22 U/L (ref 0–37)
Albumin: 3.3 g/dL — ABNORMAL LOW (ref 3.5–5.2)
Alkaline Phosphatase: 127 U/L — ABNORMAL HIGH (ref 39–117)
BUN: 18 mg/dL (ref 6–23)
Potassium: 3.6 mEq/L (ref 3.5–5.1)
Sodium: 132 mEq/L — ABNORMAL LOW (ref 135–145)
Total Protein: 7.9 g/dL (ref 6.0–8.3)

## 2011-10-29 LAB — URINE MICROSCOPIC-ADD ON

## 2011-10-29 LAB — DIFFERENTIAL
Basophils Absolute: 0 10*3/uL (ref 0.0–0.1)
Basophils Relative: 0 % (ref 0–1)
Eosinophils Absolute: 0 10*3/uL (ref 0.0–0.7)
Neutrophils Relative %: 86 % — ABNORMAL HIGH (ref 43–77)

## 2011-10-29 LAB — PROCALCITONIN: Procalcitonin: 0.65 ng/mL

## 2011-10-29 MED ORDER — SODIUM CHLORIDE 0.9 % IV BOLUS (SEPSIS)
500.0000 mL | Freq: Once | INTRAVENOUS | Status: AC
  Administered 2011-10-29: 500 mL via INTRAVENOUS

## 2011-10-29 MED ORDER — VITAMIN E 180 MG (400 UNIT) PO CAPS
400.0000 [IU] | ORAL_CAPSULE | Freq: Every day | ORAL | Status: DC
  Administered 2011-10-29 – 2011-11-05 (×8): 400 [IU] via ORAL
  Filled 2011-10-29 (×8): qty 1

## 2011-10-29 MED ORDER — ALBUTEROL SULFATE (5 MG/ML) 0.5% IN NEBU
2.5000 mg | INHALATION_SOLUTION | Freq: Four times a day (QID) | RESPIRATORY_TRACT | Status: DC
  Administered 2011-10-30 – 2011-11-05 (×26): 2.5 mg via RESPIRATORY_TRACT
  Filled 2011-10-29 (×25): qty 0.5

## 2011-10-29 MED ORDER — MIRTAZAPINE 15 MG PO TABS
15.0000 mg | ORAL_TABLET | Freq: Every day | ORAL | Status: DC
  Administered 2011-10-29 – 2011-11-04 (×7): 15 mg via ORAL
  Filled 2011-10-29 (×8): qty 1

## 2011-10-29 MED ORDER — AZITHROMYCIN 250 MG PO TABS
500.0000 mg | ORAL_TABLET | Freq: Once | ORAL | Status: AC
  Administered 2011-10-29: 500 mg via ORAL

## 2011-10-29 MED ORDER — PANTOPRAZOLE SODIUM 40 MG PO TBEC
40.0000 mg | DELAYED_RELEASE_TABLET | Freq: Every day | ORAL | Status: DC
  Administered 2011-10-29 – 2011-11-05 (×8): 40 mg via ORAL
  Filled 2011-10-29 (×8): qty 1

## 2011-10-29 MED ORDER — POLYETHYLENE GLYCOL 3350 17 G PO PACK
17.0000 g | PACK | Freq: Every day | ORAL | Status: DC
  Administered 2011-10-30 – 2011-11-01 (×3): 17 g via ORAL
  Administered 2011-11-02: 10:00:00 via ORAL
  Administered 2011-11-03 – 2011-11-04 (×2): 17 g via ORAL
  Administered 2011-11-05: 10:00:00 via ORAL
  Filled 2011-10-29 (×8): qty 1

## 2011-10-29 MED ORDER — FOLIC ACID 0.5 MG HALF TAB
0.5000 mg | ORAL_TABLET | Freq: Every day | ORAL | Status: DC
  Administered 2011-10-30 – 2011-11-05 (×7): 0.5 mg via ORAL
  Filled 2011-10-29 (×7): qty 1

## 2011-10-29 MED ORDER — SENNOSIDES-DOCUSATE SODIUM 8.6-50 MG PO TABS
1.0000 | ORAL_TABLET | Freq: Two times a day (BID) | ORAL | Status: DC
  Administered 2011-10-29 – 2011-11-04 (×11): 1 via ORAL
  Filled 2011-10-29 (×15): qty 1

## 2011-10-29 MED ORDER — ACETAMINOPHEN 325 MG PO TABS
650.0000 mg | ORAL_TABLET | Freq: Four times a day (QID) | ORAL | Status: DC | PRN
  Administered 2011-10-29 – 2011-10-30 (×4): 650 mg via ORAL
  Filled 2011-10-29 (×4): qty 2

## 2011-10-29 MED ORDER — FOLIC ACID 1 MG PO TABS
ORAL_TABLET | ORAL | Status: AC
  Filled 2011-10-29: qty 1

## 2011-10-29 MED ORDER — IPRATROPIUM-ALBUTEROL 18-103 MCG/ACT IN AERO
2.0000 | INHALATION_SPRAY | RESPIRATORY_TRACT | Status: DC | PRN
  Filled 2011-10-29: qty 14.7

## 2011-10-29 MED ORDER — DEXTROSE 5 % IV SOLN
500.0000 mg | INTRAVENOUS | Status: DC
  Administered 2011-10-29: 500 mg via INTRAVENOUS
  Filled 2011-10-29 (×2): qty 500

## 2011-10-29 MED ORDER — SUCRALFATE 1 GM/10ML PO SUSP
1.0000 g | Freq: Three times a day (TID) | ORAL | Status: DC
  Administered 2011-10-29 – 2011-11-05 (×29): 1 g via ORAL
  Filled 2011-10-29 (×32): qty 10

## 2011-10-29 MED ORDER — ENOXAPARIN SODIUM 40 MG/0.4ML ~~LOC~~ SOLN
40.0000 mg | SUBCUTANEOUS | Status: DC
  Filled 2011-10-29 (×2): qty 0.4

## 2011-10-29 MED ORDER — ALPRAZOLAM 0.5 MG PO TABS: 0.5000 mg | ORAL_TABLET | Freq: Every evening | ORAL | Status: DC | PRN

## 2011-10-29 MED ORDER — DEXTROSE 5 % IV SOLN
1.0000 g | Freq: Once | INTRAVENOUS | Status: AC
  Administered 2011-10-29: 1 g via INTRAVENOUS

## 2011-10-29 MED ORDER — LIDOCAINE-PRILOCAINE 2.5-2.5 % EX CREA
TOPICAL_CREAM | CUTANEOUS | Status: DC | PRN
  Filled 2011-10-29: qty 5

## 2011-10-29 MED ORDER — ZOLPIDEM TARTRATE 5 MG PO TABS: 5.0000 mg | ORAL_TABLET | Freq: Every evening | ORAL | Status: DC | PRN

## 2011-10-29 MED ORDER — HYDROMORPHONE HCL PF 1 MG/ML IJ SOLN: 0.5000 mg | INTRAMUSCULAR | Status: DC | PRN

## 2011-10-29 MED ORDER — METOPROLOL TARTRATE 50 MG PO TABS
50.0000 mg | ORAL_TABLET | Freq: Two times a day (BID) | ORAL | Status: DC
  Administered 2011-10-29 – 2011-11-01 (×7): 50 mg via ORAL
  Filled 2011-10-29 (×4): qty 1
  Filled 2011-10-29: qty 2
  Filled 2011-10-29 (×3): qty 1

## 2011-10-29 MED ORDER — ALBUTEROL SULFATE (5 MG/ML) 0.5% IN NEBU: 2.5000 mg | INHALATION_SOLUTION | RESPIRATORY_TRACT | Status: DC | PRN

## 2011-10-29 MED ORDER — DEXTROSE 5 % IV SOLN: 500.0000 mg | INTRAVENOUS | Status: DC

## 2011-10-29 MED ORDER — HYDROCODONE-ACETAMINOPHEN 10-325 MG PO TABS: 1.0000 | ORAL_TABLET | Freq: Four times a day (QID) | ORAL | Status: DC | PRN

## 2011-10-29 MED ORDER — ALBUTEROL SULFATE (5 MG/ML) 0.5% IN NEBU
2.5000 mg | INHALATION_SOLUTION | RESPIRATORY_TRACT | Status: DC
Start: 2011-10-29 — End: 2011-10-29
  Administered 2011-10-29: 2.5 mg via RESPIRATORY_TRACT
  Filled 2011-10-29 (×7): qty 0.5

## 2011-10-29 MED ORDER — KETOROLAC TROMETHAMINE 30 MG/ML IJ SOLN
INTRAMUSCULAR | Status: AC
  Administered 2011-10-29: 04:00:00
  Filled 2011-10-29: qty 1

## 2011-10-29 MED ORDER — ONDANSETRON HCL 4 MG PO TABS: 4.0000 mg | ORAL_TABLET | Freq: Four times a day (QID) | ORAL | Status: DC | PRN

## 2011-10-29 MED ORDER — FOLIC ACID 400 MCG PO TABS
400.0000 ug | ORAL_TABLET | Freq: Every day | ORAL | Status: DC
  Administered 2011-10-29: 1000 ug via ORAL
  Filled 2011-10-29 (×3): qty 1

## 2011-10-29 MED ORDER — ONDANSETRON HCL 4 MG/2ML IJ SOLN: 4.0000 mg | Freq: Four times a day (QID) | INTRAMUSCULAR | Status: DC | PRN

## 2011-10-29 MED ORDER — SENNA 8.6 MG PO TABS
2.0000 | ORAL_TABLET | Freq: Every day | ORAL | Status: DC | PRN
  Administered 2011-11-01: 17.2 mg via ORAL
  Filled 2011-10-29: qty 2

## 2011-10-29 MED ORDER — ASPIRIN 81 MG PO TABS
81.0000 mg | ORAL_TABLET | Freq: Every day | ORAL | Status: DC
  Administered 2011-10-29 – 2011-11-04 (×7): 81 mg via ORAL
  Filled 2011-10-29 (×9): qty 1

## 2011-10-29 MED ORDER — SODIUM CHLORIDE 0.9 % IV SOLN
INTRAVENOUS | Status: DC
  Administered 2011-10-29 – 2011-10-30 (×2): via INTRAVENOUS
  Administered 2011-10-30: 100 mL via INTRAVENOUS

## 2011-10-29 MED ORDER — ALUM & MAG HYDROXIDE-SIMETH 200-200-20 MG/5ML PO SUSP: 30.0000 mL | Freq: Four times a day (QID) | ORAL | Status: DC | PRN

## 2011-10-29 MED ORDER — AZITHROMYCIN 250 MG PO TABS
ORAL_TABLET | ORAL | Status: AC
  Administered 2011-10-29: 500 mg via ORAL
  Filled 2011-10-29: qty 2

## 2011-10-29 MED ORDER — DEXTROSE 5 % IV SOLN: 1.0000 g | INTRAVENOUS | Status: DC

## 2011-10-29 MED ORDER — ACETAMINOPHEN 650 MG RE SUPP: 650.0000 mg | Freq: Four times a day (QID) | RECTAL | Status: DC | PRN

## 2011-10-29 MED ORDER — DOCUSATE SODIUM 100 MG PO CAPS
100.0000 mg | ORAL_CAPSULE | Freq: Two times a day (BID) | ORAL | Status: DC
  Administered 2011-10-29 – 2011-11-05 (×15): 100 mg via ORAL
  Filled 2011-10-29 (×17): qty 1

## 2011-10-29 MED ORDER — DEXTROSE 5 % IV SOLN
1.0000 g | INTRAVENOUS | Status: DC
  Administered 2011-10-29: 1 g via INTRAVENOUS
  Filled 2011-10-29 (×2): qty 10

## 2011-10-29 MED ORDER — DEXTROSE 5 % IV SOLN
INTRAVENOUS | Status: AC
  Administered 2011-10-29: 03:00:00
  Filled 2011-10-29: qty 50

## 2011-10-29 MED ORDER — ACETAMINOPHEN 325 MG PO TABS
ORAL_TABLET | ORAL | Status: AC
  Administered 2011-10-29: 650 mg
  Filled 2011-10-29: qty 2

## 2011-10-29 MED ORDER — VITAMIN C 500 MG PO TABS
1000.0000 mg | ORAL_TABLET | Freq: Every day | ORAL | Status: DC
  Administered 2011-10-29 – 2011-11-05 (×8): 1000 mg via ORAL
  Filled 2011-10-29 (×8): qty 2

## 2011-10-29 NOTE — Telephone Encounter (Signed)
PT.'S DAUGHTER, CINDY, CALLED. HER FATHER IS IN THE [facility]. HE IS TO BE ADMITTED WITH PNEUMONIA AND AN URINARY TRACT INFECTION. PT. IS FOR CHEMO ON 10/30/10. PT.'S WIFE CELL #[phone]. NOTIFIED DR.SHERRILL'S NURSE, SUSAN COWARD,RN.

## 2011-10-29 NOTE — ED Notes (Signed)
Pt moved to [id number]

## 2011-10-29 NOTE — ED Notes (Signed)
Called report to [place] on 5th floor. Pt is resting and family at bedside.

## 2011-10-29 NOTE — H&P (Signed)
Admission Date:  10/29/2011  PCP:   Colette Ribas, MD  [facility]  Chief Complaint:  Fever   HPI: Kyle Love is an 70 y.o. malewith Stage IV Colon Cancer diagnosed in June 2012 currently receiving Chemo treatments with fevers that started 1 day ago, with temperature to 102.  He reports having cough and denies any dysuria.    Past Medical History  Diagnosis Date  . History of ETOH abuse     quit 25 yrs ago, heavy etoh 5 yrs  . Colitis   . History of GI diverticular bleed 3/11  . Rectus sheath hematoma 3/10  . Atrial fibrillation   . Status post Maze operation for atrial fibrillation   . Aortic aneurysm   . 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 S/P colonoscopy 05/03/10    mod diverticulosis sigmoid colon, internal hemorrhoids, suspected diverticular bleed  . Diverticulosis 07/23/2008    colonoscopy by Dr Jena Gauss, hyperplastic polyp  . IDA (iron deficiency anemia)     work-up including small bowel capsule study benign  . CHF (congestive heart failure)     diastolic  . COPD (chronic obstructive pulmonary disease)   . CAD (coronary artery disease)     s/p CABG  . S/P aortic valve replacement   . Cancer right colon T3N2M1  . Pain   . Weight decrease     20-30 lb in month    Past Surgical History  Procedure Date  . Maze   . Aortic valve replacement 2008  . Hemorrhoid surgery   . Esophagogastroduodenoscopy 1/12    Dr Elnoria Howard reportedly normal  . Tonsillectomy   . Colonoscopy 04/2010    at [facility]: diverticlosis,internal hemorrhiods  . Colon surgery 06/08/11    LAP. RIGHT COLECTOMY  . Cholecystectomy 06/08/11  . Portacath placement     Medications:  HOME MEDS: Prior to Admission medications   Medication Sig Start Date End Date Taking? Authorizing Provider  ALPRAZolam Prudy Feeler) 0.5 MG tablet Take 0.5 mg by mouth at bedtime as needed.     Yes Historical Provider, MD  Ascorbic Acid  (VITAMIN C) 1000 MG tablet Take 1,000 mg by mouth daily.     Yes Historical Provider, MD  aspirin 81 MG tablet Take 81 mg by mouth daily.     Yes Historical Provider, MD  CARAFATE 1 GM/10ML suspension Take 1 g by mouth 4 (four) times daily -  before meals and at bedtime.  08/07/11  Yes Historical Provider, MD  Cyanocobalamin (VITAMIN B 12 PO) Take by mouth.     Yes Historical Provider, MD  docusate sodium (COLACE) 100 MG capsule Take 100 mg by mouth 2 (two) times daily.     Yes Historical Provider, MD  folic acid (FOLVITE) 400 MCG tablet Take 400 mcg by mouth daily.     Yes Historical Provider, MD  furosemide (LASIX) 20 MG tablet Take 1 tablet (20 mg total) by mouth daily. 10/24/11  Yes Wendall Stade, MD  HYDROcodone-acetaminophen [facility]) 10-325 MG per tablet Norco 5/325 #30 w/ no refills 07/23/11  Yes Adolph Pollack, MD  lidocaine-prilocaine (EMLA) cream Ad lib. 07/13/11  Yes Historical Provider, MD  metoprolol (LOPRESSOR) 50 MG tablet 1 and 1/2 tablet twice daily 09/18/11  Yes Wendall Stade, MD  mirtazapine (REMERON) 15 MG tablet Take 15 mg by mouth at bedtime.  07/13/11  Yes Historical Provider, MD  pantoprazole (PROTONIX) 40  MG tablet Take 40 mg by mouth daily.    Yes Historical Provider, MD  polyethylene glycol (MIRALAX / GLYCOLAX) packet Take 17 g by mouth daily.     Yes Historical Provider, MD  potassium chloride (KLOR-CON) 10 MEQ CR tablet Take 10 mEq by mouth daily.     Yes Historical Provider, MD  prochlorperazine (COMPAZINE) 10 MG tablet Take 10 mg by mouth every 6 (six) hours as needed. Nausea    Yes Historical Provider, MD  vitamin E 400 UNIT capsule Take 400 Units by mouth daily.     Yes Historical Provider, MD    Allergies:  Allergies  Allergen Reactions  . Diltiazem Hcl     REACTION: Severe lower extremity edema on generic cardizem  . Penicillins     Pt can tolerate cephalosporins  . Plasma Human     Social History:   reports that he quit smoking about 9 months ago. His  smoking use included Cigarettes. He has a 56 pack-year smoking history. He does not have any smokeless tobacco history on file. He reports that he does not drink alcohol or use illicit drugs.  Family History: Family History  Problem Relation Age of Onset  . Lung cancer Mother   . Cancer Mother     lung  . Coronary artery disease Father   . Lung cancer Sister   . Cancer Sister     lung  . Pancreatic cancer Brother   . Cancer Brother     pancreatic    Rewiew of Systems:  The patient denies anorexia,decreased hearing, hoarseness, chest pain, syncope, dyspnea on exertion, peripheral edema, balance deficits, hemoptysis, abdominal pain, melena, hematochezia, severe indigestion/heartburn, hematuria, incontinence, genital sores, muscle weakness, suspicious skin lesions, transient blindness, difficulty walking, depression, unusual weight change, abnormal bleeding, enlarged lymph nodes, angioedema.  Physical Exam: Filed Vitals:   10/29/11 0136 10/29/11 0138 10/29/11 0339 10/29/11 0446  BP: 129/62  128/52   Pulse:   102   Temp:   102.8 F (39.3 C) 99.5 F (37.5 C)  TempSrc:   Oral   Resp:  26 28   SpO2:  100% 100%    Blood pressure 128/52, pulse 102, temperature 99.5 F (37.5 C), temperature source Oral, resp. rate 28, SpO2 100.00%.  EAV:[id number] 70 year old well nourished , well developed Causasian male lying in the stretcher in no acute distress; cooperative with exam PSYCH: He is alert and oriented x4; does not appear anxious does not appear depressed; affect is normal HEENT: Normocephalic and Atraumatic, Mucous membranes pink and anicteric; PERRLA; EOM intact; Nares: Patent, Oropharynx: Clear, Edentulous or Fair Dentition, Neck:  FROM, no cervical lymphadenopathy nor thyromegaly or carotid bruit; no JVD; Breasts:: Not examined CHEST WALL: No tenderness CHEST: Normal respiration, clear to auscultation bilaterally HEART: Regular rate and rhythm; no murmurs rubs or gallops BACK: No  kyphosis or scoliosis; no CVA tenderness ABDOMEN: soft non-tender; no masses, no organomegaly, normal abdominal bowel sounds; no pannus; no intertriginous candida. Rectal Exam: Not done EXTREMITIES: No bone or joint deformity; age-appropriate arthropathy of the hands and knees; no edema; no ulcerations. Genitalia: not examined PULSES: 2+ and symmetric SKIN: Normal hydration no rash or ulceration CNS: Cranial nerves 2-12 grossly intact no focal neurologic deficit   Labs & Imaging Results for orders placed during the hospital encounter of 10/29/11 (from the past 48 hour(s))  CBC     Status: Abnormal   Collection Time   10/29/11  1:26 AM      Component  Value Range Comment   WBC 18.2 (*) 4.0 - 10.5 (K/uL)    RBC 3.49 (*) 4.22 - 5.81 (MIL/uL)    Hemoglobin 11.1 (*) 13.0 - 17.0 (g/dL)    HCT 01.0 (*) 93.2 - 52.0 (%)    MCV 97.4  78.0 - 100.0 (fL)    MCH 31.8  26.0 - 34.0 (pg)    MCHC 32.6  30.0 - 36.0 (g/dL)    RDW 35.5 (*) 73.2 - 15.5 (%)    Platelets 218  150 - 400 (K/uL)   DIFFERENTIAL     Status: Abnormal   Collection Time   10/29/11  1:26 AM      Component Value Range Comment   Neutrophils Relative 86 (*) 43 - 77 (%)    Neutro Abs 15.6 (*) 1.7 - 7.7 (K/uL)    Lymphocytes Relative 4 (*) 12 - 46 (%)    Lymphs Abs 0.6 (*) 0.7 - 4.0 (K/uL)    Monocytes Relative 11  3 - 12 (%)    Monocytes Absolute 1.9 (*) 0.1 - 1.0 (K/uL)    Eosinophils Relative 0  0 - 5 (%)    Eosinophils Absolute 0.0  0.0 - 0.7 (K/uL)    Basophils Relative 0  0 - 1 (%)    Basophils Absolute 0.0  0.0 - 0.1 (K/uL)   COMPREHENSIVE METABOLIC PANEL     Status: Abnormal   Collection Time   10/29/11  1:26 AM      Component Value Range Comment   Sodium 132 (*) 135 - 145 (mEq/L)    Potassium 3.6  3.5 - 5.1 (mEq/L)    Chloride 93 (*) 96 - 112 (mEq/L)    CO2 29  19 - 32 (mEq/L)    Glucose, Bld 99  70 - 99 (mg/dL)    BUN 18  6 - 23 (mg/dL)    Creatinine, Ser 2.02  0.50 - 1.35 (mg/dL)    Calcium 9.5  8.4 - 10.5 (mg/dL)     Total Protein 7.9  6.0 - 8.3 (g/dL)    Albumin 3.3 (*) 3.5 - 5.2 (g/dL)    AST 22  0 - 37 (U/L)    ALT 9  0 - 53 (U/L)    Alkaline Phosphatase 127 (*) 39 - 117 (U/L)    Total Bilirubin 0.8  0.3 - 1.2 (mg/dL)    GFR calc non Af Amer 73 (*) >90 (mL/min)    GFR calc Af Amer 85 (*) >90 (mL/min)   URINALYSIS, ROUTINE W REFLEX MICROSCOPIC     Status: Abnormal   Collection Time   10/29/11  1:48 AM      Component Value Range Comment   Color, Urine YELLOW  YELLOW     Appearance CLOUDY (*) CLEAR     Specific Gravity, Urine 1.023  1.005 - 1.030     pH 6.0  5.0 - 8.0     Glucose, UA NEGATIVE  NEGATIVE (mg/dL)    Hgb urine dipstick SMALL (*) NEGATIVE     Bilirubin Urine NEGATIVE  NEGATIVE     Ketones, ur NEGATIVE  NEGATIVE (mg/dL)    Protein, ur 30 (*) NEGATIVE (mg/dL)    Urobilinogen, UA 0.2  0.0 - 1.0 (mg/dL)    Nitrite NEGATIVE  NEGATIVE     Leukocytes, UA MODERATE (*) NEGATIVE    URINE MICROSCOPIC-ADD ON     Status: Abnormal   Collection Time   10/29/11  1:48 AM      Component Value Range  Comment   Squamous Epithelial / LPF RARE  RARE     WBC, UA TOO NUMEROUS TO COUNT  <3 (WBC/hpf) WITH CLUMPS   RBC / HPF 3-6  <3 (RBC/hpf)    Bacteria, UA MANY (*) RARE    LACTIC ACID, PLASMA     Status: Normal   Collection Time   10/29/11  4:17 AM      Component Value Range Comment   Lactic Acid, Venous 0.8  0.5 - 2.2 (mmol/L)   PROCALCITONIN     Status: Normal   Collection Time   10/29/11  4:17 AM      Component Value Range Comment   Procalcitonin 0.65      Dg Chest Portable 1 View  10/29/2011  *RADIOLOGY REPORT*  Clinical Data: Chest pain  PORTABLE CHEST - 1 VIEW  Comparison: 09/14/2011  Findings: Right internal jugular vein Port-A-Cath is stable. Patchy densities have developed throughout the right lung.  Left perihilar patchy opacities have also developed.  Mild cardiomegaly. No pneumothorax.  There is blunting of the right costophrenic angle and a right pleural effusion may also be present.   IMPRESSION: Bilateral patchy airspace disease has developed.  Right pleural effusion is suspected.  Original Report Authenticated By: Donavan Burnet, M.D.      Assessment: 1. Pneumonia 2. UTI 3. Fever Secondary to #1 and # 2 4. Anemia 5. Stage IV Colon Cancer on Chemotherapy  Plan:     Admission, and patient has been placed on IV Antibiotic therapy of IV Rocephin and Azithromycin to cover both CAP, and an UTI.  IV Fluids have been ordered for hydration. Patient's medications have been reconciled.  His medications were reconciled.   DVT prophylaxis has been ordered.  And Patient is a FULL CODE.      Other plans as per orders.    Aniah Pauli C 10/29/2011, 5:33 AM

## 2011-10-29 NOTE — ED Provider Notes (Signed)
History     CSN: [id number] Arrival date & time: 10/29/2011 12:16 AM   First MD Initiated Contact with Patient 10/29/11 0054      No chief complaint on file.   (Consider location/radiation/quality/duration/timing/severity/associated sxs/prior treatment) HPI Comments: Pt with hx of Colon CA - last chemo was one month ago - has been having one day of fevers, with "feeling lousy" but denies sob, cp, back pain, abd pain, diarrhea, rash or n/v.  He does admit to ha, urinary hesitancy which is new for him and a cough with which he has had for some time and is scheduled for xray this week.  Sx are constant, improved with tyulenol, as high as 102.8.    The history is provided by the patient, the spouse and medical records.    Past Medical History  Diagnosis Date  . History of ETOH abuse     quit 25 yrs ago, heavy etoh 5 yrs  . Colitis   . History of GI diverticular bleed 3/11  . Rectus sheath hematoma 3/10  . Atrial fibrillation   . Status post Maze operation for atrial fibrillation   . Aortic aneurysm   . 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 S/P colonoscopy 05/03/10    mod diverticulosis sigmoid colon, internal hemorrhoids, suspected diverticular bleed  . Diverticulosis 07/23/2008    colonoscopy by Dr Jena Gauss, hyperplastic polyp  . IDA (iron deficiency anemia)     work-up including small bowel capsule study benign  . CHF (congestive heart failure)     diastolic  . COPD (chronic obstructive pulmonary disease)   . CAD (coronary artery disease)     s/p CABG  . S/P aortic valve replacement   . Cancer right colon T3N2M1  . Pain   . Weight decrease     20-30 lb in month    Past Surgical History  Procedure Date  . Maze   . Aortic valve replacement 2008  . Hemorrhoid surgery   . Esophagogastroduodenoscopy 1/12    Dr Elnoria Howard reportedly normal  . Tonsillectomy   . Colonoscopy 04/2010    at [facility]:  diverticlosis,internal hemorrhiods  . Colon surgery 06/08/11    LAP. RIGHT COLECTOMY  . Cholecystectomy 06/08/11  . Portacath placement     Family History  Problem Relation Age of Onset  . Lung cancer Mother   . Cancer Mother     lung  . Coronary artery disease Father   . Lung cancer Sister   . Cancer Sister     lung  . Pancreatic cancer Brother   . Cancer Brother     pancreatic    History  Substance Use Topics  . Smoking status: Former Smoker -- 1.0 packs/day for 56 years    Types: Cigarettes    Quit date: 01/01/2011  . Smokeless tobacco: Not on file   Comment: recently restarted  . Alcohol Use: No     history etoh abuse, quit 25 yrs ago      Review of Systems  All other systems reviewed and are negative.    Allergies  Diltiazem hcl; Penicillins; and Plasma human  Home Medications   Current Outpatient Rx  Name Route Sig Dispense Refill  . FUROSEMIDE 20 MG PO TABS Oral Take 1 tablet (20 mg total) by mouth daily. 30 tablet 12  . HYDROCODONE-ACETAMINOPHEN 10-325 MG PO TABS  Norco 5/325 #30 w/ no refills 30 tablet 0  .  METOPROLOL TARTRATE 50 MG PO TABS  1 and 1/2 tablet twice daily 270 tablet 4  . ALBUTEROL SULFATE HFA 108 (90 BASE) MCG/ACT IN AERS Inhalation Inhale 2 puffs into the lungs every 6 (six) hours as needed.      . ALPRAZOLAM 0.25 MG PO TABS Oral Take 0.25 mg by mouth at bedtime as needed.      Marland Kitchen VITAMIN C 1000 MG PO TABS Oral Take 1,000 mg by mouth daily.      Marland Kitchen CARAFATE 1 GM/10ML PO SUSP  BID times 48H.    Marland Kitchen VITAMIN B 12 PO Oral Take by mouth.      . DOCUSATE SODIUM 100 MG PO CAPS Oral Take 100 mg by mouth 2 (two) times daily.      Marland Kitchen FOLIC ACID 400 MCG PO TABS Oral Take 400 mcg by mouth daily.      Marland Kitchen LIDOCAINE-PRILOCAINE 2.5-2.5 % EX CREA  Ad lib.    Marland Kitchen MIRTAZAPINE 15 MG PO TABS      . PANTOPRAZOLE SODIUM 40 MG PO TBEC Oral Take 40 mg by mouth daily.      Marland Kitchen POTASSIUM CHLORIDE 10 MEQ PO TBCR Oral Take 10 mEq by mouth daily.      Marland Kitchen VITAMIN E 400 UNITS  PO CAPS Oral Take 400 Units by mouth daily.      Marland Kitchen ZOLPIDEM TARTRATE 5 MG PO TABS  Ad lib.      BP 127/49  Pulse 98  Temp(Src) 99.9 F (37.7 C) (Oral)  Resp 20  SpO2 100%  Physical Exam  Nursing note and vitals reviewed. Constitutional: He appears well-developed and well-nourished. No distress.  HENT:  Head: Normocephalic and atraumatic.  Mouth/Throat: Oropharynx is clear and moist. No oropharyngeal exudate.  Eyes: Conjunctivae and EOM are normal. Pupils are equal, round, and reactive to light. Right eye exhibits no discharge. Left eye exhibits no discharge. No scleral icterus.  Neck: Normal range of motion. Neck supple. No JVD present. No thyromegaly present.  Cardiovascular: Normal rate, regular rhythm, normal heart sounds and intact distal pulses.  Exam reveals no gallop and no friction rub.   No murmur heard. Pulmonary/Chest: Effort normal and breath sounds normal. No respiratory distress. He has no wheezes. He has no rales.  Abdominal: Soft. Bowel sounds are normal. He exhibits no distension and no mass. There is no tenderness.  Musculoskeletal: Normal range of motion. He exhibits no edema and no tenderness.  Lymphadenopathy:    He has no cervical adenopathy.  Neurological: He is alert. Coordination normal.  Skin: Skin is warm and dry. No rash noted. No erythema.  Psychiatric: He has a normal mood and affect. His behavior is normal.    ED Course  Procedures (including critical care time)   Labs Reviewed  CBC  DIFFERENTIAL  COMPREHENSIVE METABOLIC PANEL  URINALYSIS, ROUTINE W REFLEX MICROSCOPIC  URINE CULTURE  CULTURE, BLOOD (ROUTINE X 2)   No results found.   No diagnosis found.    MDM  Exam unremarkable other than fever, no hypotension and normal MS - no focal signs of infection - r/o UTI and pna.  Labs, fluids.   Results for orders placed during the hospital encounter of 10/29/11  CBC      Component Value Range   WBC 18.2 (*) 4.0 - 10.5 (K/uL)   RBC  3.49 (*) 4.22 - 5.81 (MIL/uL)   Hemoglobin 11.1 (*) 13.0 - 17.0 (g/dL)   HCT 27.2 (*) 53.6 - 52.0 (%)   MCV  97.4  78.0 - 100.0 (fL)   MCH 31.8  26.0 - 34.0 (pg)   MCHC 32.6  30.0 - 36.0 (g/dL)   RDW 40.9 (*) 81.1 - 15.5 (%)   Platelets 218  150 - 400 (K/uL)  DIFFERENTIAL      Component Value Range   Neutrophils Relative 86 (*) 43 - 77 (%)   Neutro Abs 15.6 (*) 1.7 - 7.7 (K/uL)   Lymphocytes Relative 4 (*) 12 - 46 (%)   Lymphs Abs 0.6 (*) 0.7 - 4.0 (K/uL)   Monocytes Relative 11  3 - 12 (%)   Monocytes Absolute 1.9 (*) 0.1 - 1.0 (K/uL)   Eosinophils Relative 0  0 - 5 (%)   Eosinophils Absolute 0.0  0.0 - 0.7 (K/uL)   Basophils Relative 0  0 - 1 (%)   Basophils Absolute 0.0  0.0 - 0.1 (K/uL)  COMPREHENSIVE METABOLIC PANEL      Component Value Range   Sodium 132 (*) 135 - 145 (mEq/L)   Potassium 3.6  3.5 - 5.1 (mEq/L)   Chloride 93 (*) 96 - 112 (mEq/L)   CO2 29  19 - 32 (mEq/L)   Glucose, Bld 99  70 - 99 (mg/dL)   BUN 18  6 - 23 (mg/dL)   Creatinine, Ser 9.14  0.50 - 1.35 (mg/dL)   Calcium 9.5  8.4 - 78.2 (mg/dL)   Total Protein 7.9  6.0 - 8.3 (g/dL)   Albumin 3.3 (*) 3.5 - 5.2 (g/dL)   AST 22  0 - 37 (U/L)   ALT 9  0 - 53 (U/L)   Alkaline Phosphatase 127 (*) 39 - 117 (U/L)   Total Bilirubin 0.8  0.3 - 1.2 (mg/dL)   GFR calc non Af Amer 73 (*) >90 (mL/min)   GFR calc Af Amer 85 (*) >90 (mL/min)  URINALYSIS, ROUTINE W REFLEX MICROSCOPIC      Component Value Range   Color, Urine YELLOW  YELLOW    Appearance CLOUDY (*) CLEAR    Specific Gravity, Urine 1.023  1.005 - 1.030    pH 6.0  5.0 - 8.0    Glucose, UA NEGATIVE  NEGATIVE (mg/dL)   Hgb urine dipstick SMALL (*) NEGATIVE    Bilirubin Urine NEGATIVE  NEGATIVE    Ketones, ur NEGATIVE  NEGATIVE (mg/dL)   Protein, ur 30 (*) NEGATIVE (mg/dL)   Urobilinogen, UA 0.2  0.0 - 1.0 (mg/dL)   Nitrite NEGATIVE  NEGATIVE    Leukocytes, UA MODERATE (*) NEGATIVE   URINE MICROSCOPIC-ADD ON      Component Value Range   Squamous  Epithelial / LPF RARE  RARE    WBC, UA TOO NUMEROUS TO COUNT  <3 (WBC/hpf)   RBC / HPF 3-6  <3 (RBC/hpf)   Bacteria, UA MANY (*) RARE     Dg Chest Portable 1 View  10/29/2011  *RADIOLOGY REPORT*  Clinical Data: Chest pain  PORTABLE CHEST - 1 VIEW  Comparison: 09/14/2011  Findings: Right internal jugular vein Port-A-Cath is stable. Patchy densities have developed throughout the right lung.  Left perihilar patchy opacities have also developed.  Mild cardiomegaly. No pneumothorax.  There is blunting of the right costophrenic angle and a right pleural effusion may also be present.  IMPRESSION: Bilateral patchy airspace disease has developed.  Right pleural effusion is suspected.  Original Report Authenticated By: Donavan Burnet, M.D.   CRITICAL CARE Performed by: Vida Roller   Total critical care time: 35  Critical care time was  exclusive of separately billable procedures and treating other patients.  Critical care was necessary to treat or prevent imminent or life-threatening deterioration.  Critical care was time spent personally by me on the following activities: development of treatment plan with patient and/or surrogate as well as nursing, discussions with consultants, evaluation of patient's response to treatment, examination of patient, obtaining history from patient or surrogate, ordering and performing treatments and interventions, ordering and review of laboratory studies, ordering and review of radiographic studies, pulse oximetry and re-evaluation of patient's condition.   Laboratory evaluation shows leukocytosis, urinary tract infection, possible bilateral pneumonias. I have discussed this with the patient states that he can take cephalosporins. Vital signs show fever greater than 102 during ED stay and with the elevated WBC count,this suggests a early sepsis with systemic inflammatory response syndrome.  Care d/w Hospitalist who agrees to admission   ED ECG REPORT   Date:  10/29/2011   Rate: 93  Rhythm: atrial fibrillation and premature ventricular contractions (PVC)  QRS Axis: normal  Intervals: normal  ST/T Wave abnormalities: normal  Conduction Disutrbances:none  Narrative Interpretation:   Old EKG Reviewed: unchanged from 06/10/11   Vida Roller, MD 10/29/11 [phone]

## 2011-10-29 NOTE — Progress Notes (Signed)
  Subjective:  No events overnight.  Objective:  Vital signs in last 24 hours:  Filed Vitals:   10/29/11 0732 10/29/11 1105 10/29/11 1324 10/29/11 1327  BP: 131/72 156/71 150/65   Pulse: 98 105 90   Temp: 98.4 F (36.9 C) 100 F (37.8 C)  99 F (37.2 C)  TempSrc: Oral   Axillary  Resp: 17 26 18    SpO2: 100% 95% 98%     Intake/Output from previous day:  No intake or output data in the 24 hours ending 10/29/11 1502  Physical Exam: General: Alert, awake, oriented x3, in no acute distress. HEENT: No bruits, no goiter. Heart: Irregular rate and rhythm, without rubs, gallops. No JVD, no carotid bruits/ Lungs: Clear to auscultation bilaterally. Decreased sounds at bases Abdomen: Soft, nontender, nondistended, positive bowel sounds. Extremities: No clubbing cyanosis or edema with positive pedal pulses. Neuro: Grossly intact, nonfocal.   Lab Results:  Basic Metabolic Panel:    Component Value Date/Time   NA 132* 10/29/2011 0126   K 3.6 10/29/2011 0126   CL 93* 10/29/2011 0126   CO2 29 10/29/2011 0126   BUN 18 10/29/2011 0126   CREATININE 1.00 10/29/2011 0126   GLUCOSE 99 10/29/2011 0126   CALCIUM 9.5 10/29/2011 0126   CBC:    Component Value Date/Time   WBC 18.2* 10/29/2011 0126   HGB 11.1* 10/29/2011 0126   HGB 9.4* 09/26/2011 0807   HCT 34.0* 10/29/2011 0126   HCT 28.4* 09/26/2011 0807   PLT 218 10/29/2011 0126   PLT 230 09/26/2011 0807   MCV 97.4 10/29/2011 0126   MCV 91.0 09/26/2011 0807   NEUTROABS 15.6* 10/29/2011 0126   LYMPHSABS 0.6* 10/29/2011 0126   MONOABS 1.9* 10/29/2011 0126   EOSABS 0.0 10/29/2011 0126   EOSABS 0.1 09/26/2011 0807   BASOSABS 0.0 10/29/2011 0126   BASOSABS 0.0 09/26/2011 0807    No results found for this or any previous visit (from the past 240 hour(s)).  Studies/Results: Dg Chest Portable 1 View 10/29/2011  IMPRESSION: Bilateral patchy airspace disease has developed.  Right pleural effusion is suspected.    Medications: Scheduled Meds:   .  acetaminophen      . aspirin  81 mg Oral Daily  . azithromycin  500 mg Oral Once  . cefTRIAXone (ROCEPHIN) IV  1 g Intravenous Once  . dextrose      . docusate sodium  100 mg Oral BID  . folic acid  400 mcg Oral Daily  . ketorolac      . metoprolol  50 mg Oral BID  . mirtazapine  15 mg Oral QHS  . pantoprazole  40 mg Oral Daily  . polyethylene glycol  17 g Oral Daily  . sodium chloride  500 mL Intravenous Once  . sucralfate  1 g Oral TID AC & HS  . vitamin C  1,000 mg Oral Daily  . vitamin E  400 Units Oral Daily   Continuous Infusions:  PRN Meds:.ALPRAZolam, HYDROcodone-acetaminophen, lidocaine-prilocaine  Assessment/Plan:  Active Problems:  PNA (pneumonia) - pt clinically and hemodynamically stable, continue supportive care with antibiotics and keep monitoring vital signs.   UTI (lower urinary tract infection) - continue Rocephin IV and await for final results.   Atrial fibrillation - rate controlled   COPD - will order nebs prn SOB   ANEMIA-IRON DEFICIENCY -stable and at pt's baseline.   Constipation - continue bowel regimen    LOS: 0 days   MAGICK-MYERS, ISKRA 10/29/2011, 3:02 PM

## 2011-10-29 NOTE — Telephone Encounter (Signed)
Patient daughter Arline Asp, calling was told by Dr. [name] anytime patient having a problem to call the office. Pt in emergency room at [facility] with pneumonia.

## 2011-10-29 NOTE — Telephone Encounter (Signed)
Spoke with pt wife, they are at [facility] long, the pt has pneumonia in both lungs, UTI and is waiting for a bed in house to be monitored. They wanted to let dr [name] know he was there. Will make dr [name] aware Deliah Goody

## 2011-10-29 NOTE — ED Notes (Signed)
Wife states that pt started having a fever today, pt has stage 4 colon cancer, he had a high fever about 6 weeks ago and received IV antibiotics, hes very congested and complains of a headache

## 2011-10-29 NOTE — Telephone Encounter (Signed)
F/u:  Daughter is afraid she has missed your call.  The numbers are [phone] and [phone].  Please call her asap.

## 2011-10-29 NOTE — Progress Notes (Signed)
INITIAL ADULT NUTRITION ASSESSMENT Date: 10/29/2011   Time: 4:32 PM Reason for Assessment: Health history  ASSESSMENT: Male 70 y.o.  Dx: Fever  Hx:  Past Medical History  Diagnosis Date  . History of ETOH abuse     quit 25 yrs ago, heavy etoh 5 yrs  . Colitis   . History of GI diverticular bleed 3/11  . Rectus sheath hematoma 3/10  . Status post Maze operation for atrial fibrillation   . Aortic aneurysm   . 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 S/P colonoscopy 05/03/10    mod diverticulosis sigmoid colon, internal hemorrhoids, suspected diverticular bleed  . Diverticulosis 07/23/2008    colonoscopy by Dr Jena Gauss, hyperplastic polyp  . IDA (iron deficiency anemia)     work-up including small bowel capsule study benign  . CHF (congestive heart failure)     diastolic  . COPD (chronic obstructive pulmonary disease)   . CAD (coronary artery disease)     s/p CABG  . S/P aortic valve replacement   . Cancer right colon T3N2M1  . Weight decrease     20-30 lb in month  . Pneumonia   . History of colon polyps   . Shortness of breath   . Arthritis     bilateral neck and shoulder pain  . Atrial fibrillation    Related Meds:  Scheduled Meds:   . acetaminophen      . albuterol  2.5 mg Nebulization Q4H  . aspirin  81 mg Oral Daily  . azithromycin  500 mg Intravenous Q24H  . azithromycin  500 mg Oral Once  . cefTRIAXone (ROCEPHIN) IV  1 g Intravenous Once  . cefTRIAXone (ROCEPHIN) IV  1 g Intravenous Q24H  . dextrose      . docusate sodium  100 mg Oral BID  . enoxaparin  40 mg Subcutaneous Q24H  . folic acid  400 mcg Oral Daily  . ketorolac      . metoprolol  50 mg Oral BID  . mirtazapine  15 mg Oral QHS  . pantoprazole  40 mg Oral Daily  . polyethylene glycol  17 g Oral Daily  . senna-docusate  1 tablet Oral BID  . sodium chloride  500 mL Intravenous Once  . sucralfate  1 g Oral TID AC & HS  .  vitamin C  1,000 mg Oral Daily  . vitamin E  400 Units Oral Daily  . DISCONTD: azithromycin  500 mg Intravenous Q24H  . DISCONTD: cefTRIAXone (ROCEPHIN) IV  1 g Intravenous Q24H   Continuous Infusions:   . sodium chloride     PRN Meds:.acetaminophen, acetaminophen, albuterol-ipratropium, ALPRAZolam, alum & mag hydroxide-simeth, HYDROcodone-acetaminophen, HYDROmorphone, lidocaine-prilocaine, ondansetron (ZOFRAN) IV, ondansetron, senna, zolpidem, DISCONTD: albuterol  Ht: 6\' 1"  (185.4 cm)  Wt: 192 lb 3.9 oz (87.2 kg)  Ideal Wt: 83.6kg % Ideal Wt: 104  Usual Wt: 88.1kg % Usual Wt: 99  Body mass index is 25.36 kg/(m^2).  Food/Nutrition Related Hx: Pt asleep during visit. Wife reports pt "eating what he wants to" at home. Wife reports pt had N/V day and night during first 2 weeks of chemotherapy and went from 210 pounds to 169 pounds during this time. Wife reports pt much better with other cycles of chemotherapy, scheduled to start cycle 6 on Wednesday. Wife reports pt now up to 194 pounds with good appetite, not on any nutritional supplements at home. Wife reports pt  not eating well today r/t fever and tiredness, as pt did not sleep well in ED.   Labs:  CMP     Component Value Date/Time   NA 132* 10/29/2011 0126   K 3.6 10/29/2011 0126   CL 93* 10/29/2011 0126   CO2 29 10/29/2011 0126   GLUCOSE 99 10/29/2011 0126   BUN 18 10/29/2011 0126   CREATININE 1.00 10/29/2011 0126   CALCIUM 9.5 10/29/2011 0126   PROT 7.9 10/29/2011 0126   ALBUMIN 3.3* 10/29/2011 0126   AST 22 10/29/2011 0126   ALT 9 10/29/2011 0126   ALKPHOS 127* 10/29/2011 0126   BILITOT 0.8 10/29/2011 0126   GFRNONAA 73* 10/29/2011 0126   GFRAA 85* 10/29/2011 0126    Diet Order:  Regular  IVF:    sodium chloride    Estimated Nutritional Needs:   Kcal:2200-2600 Protein:105-130g Fluid:2.2-2.6L  NUTRITION DIAGNOSIS: -Predicted suboptimal energy intake (NI-1.6).  Status: Ongoing  RELATED TO: fever and tiredness  AS  EVIDENCE BY: wife's report of poor intake today  MONITORING/EVALUATION(Goals): Pt to consume >75% of meals.   EDUCATION NEEDS: -No education needs identified at this time  INTERVENTION: Encouraged increased intake once pt alert. Hopefully as fever resolves, pt's intake will improve. Wife denies any need for nutritional supplements at this time. Will monitor.   Dietitian [phone]  DOCUMENTATION CODES Per approved criteria  -Not Applicable    Marshall Cork 10/29/2011, 4:32 PM

## 2011-10-29 NOTE — ED Notes (Signed)
Pt given urinal and made aware that ua sample is needed- pt unable to void at this time.

## 2011-10-30 DIAGNOSIS — E876 Hypokalemia: Secondary | ICD-10-CM | POA: Diagnosis not present

## 2011-10-30 DIAGNOSIS — E871 Hypo-osmolality and hyponatremia: Secondary | ICD-10-CM | POA: Diagnosis present

## 2011-10-30 DIAGNOSIS — D72829 Elevated white blood cell count, unspecified: Secondary | ICD-10-CM | POA: Diagnosis present

## 2011-10-30 LAB — BASIC METABOLIC PANEL
BUN: 17 mg/dL (ref 6–23)
Calcium: 8.8 mg/dL (ref 8.4–10.5)
Creatinine, Ser: 0.94 mg/dL (ref 0.50–1.35)
GFR calc Af Amer: >90 mL/min (ref >90)
GFR calc non Af Amer: 82 mL/min — ABNORMAL LOW (ref >90)

## 2011-10-30 LAB — CBC
MCHC: 31.6 g/dL (ref 30.0–36.0)
Platelets: 178 10*3/uL (ref 150–400)
RDW: 17.8 % — ABNORMAL HIGH (ref 11.5–15.5)
WBC: 10.9 10*3/uL — ABNORMAL HIGH (ref 4.0–10.5)

## 2011-10-30 MED ORDER — VANCOMYCIN HCL IN DEXTROSE 1-5 GM/200ML-% IV SOLN
1000.0000 mg | Freq: Three times a day (TID) | INTRAVENOUS | Status: DC
  Administered 2011-10-30 – 2011-11-02 (×9): 1000 mg via INTRAVENOUS
  Filled 2011-10-30 (×13): qty 200

## 2011-10-30 MED ORDER — POTASSIUM CHLORIDE CRYS ER 20 MEQ PO TBCR
40.0000 meq | EXTENDED_RELEASE_TABLET | Freq: Once | ORAL | Status: AC
  Administered 2011-10-30: 40 meq via ORAL
  Filled 2011-10-30: qty 2

## 2011-10-30 MED ORDER — PIPERACILLIN-TAZOBACTAM 3.375 G IVPB
3.3750 g | Freq: Three times a day (TID) | INTRAVENOUS | Status: DC
  Administered 2011-10-30 – 2011-11-02 (×9): 3.375 g via INTRAVENOUS
  Filled 2011-10-30 (×12): qty 50

## 2011-10-30 MED ORDER — FLUCONAZOLE IN SODIUM CHLORIDE 400-0.9 MG/200ML-% IV SOLN
400.0000 mg | INTRAVENOUS | Status: DC
  Administered 2011-10-30 – 2011-11-04 (×5): 400 mg via INTRAVENOUS
  Filled 2011-10-30 (×7): qty 200

## 2011-10-30 NOTE — Progress Notes (Signed)
[id number]/Keven Osborn/Utilization review of Chart performed. 

## 2011-10-30 NOTE — Progress Notes (Signed)
Subjective:  No events overnight. Patient reports improvement in shortness of breath. Denies chest pain. Patient also denies fevers, chills, abdominal pain  Objective:  Vital signs in last 24 hours:  Filed Vitals:   10/30/11 0551 10/30/11 0659 10/30/11 0831 10/30/11 1051  BP: 158/75   112/62  Pulse:    72  Temp:  98.7 F (37.1 C)  98.5 F (36.9 C)  TempSrc:    Oral  Resp:    20  Height:      Weight:      SpO2:   98% 96%    Intake/Output from previous day:   Intake/Output Summary (Last 24 hours) at 10/30/11 1118 Last data filed at 10/30/11 1052  Gross per 24 hour  Intake 1759.61 ml  Output      0 ml  Net 1759.61 ml    Physical Exam: General: Alert, awake, oriented x3, in no acute distress. HEENT: No bruits, no goiter. Skin is warm to Heart: Regular rate and rhythm, without murmurs, rubs, gallops. Lungs: Clear to auscultation bilaterally. Decreased sounds at the bases with minimal bibasilar crackles Abdomen: Soft, nontender, nondistended, positive bowel sounds. Extremities: No clubbing cyanosis or edema with positive pedal pulses. Neuro: Grossly intact, nonfocal.   Lab Results:  Basic Metabolic Panel:    Component Value Date/Time   NA 131* 10/30/2011 0520   K 3.4* 10/30/2011 0520   CL 94* 10/30/2011 0520   CO2 27 10/30/2011 0520   BUN 17 10/30/2011 0520   CREATININE 0.94 10/30/2011 0520   GLUCOSE 100* 10/30/2011 0520   CALCIUM 8.8 10/30/2011 0520   CBC:    Component Value Date/Time   WBC 10.9* 10/30/2011 0520   HGB 10.5* 10/30/2011 0520   HGB 9.4* 09/26/2011 0807   HCT 33.2* 10/30/2011 0520   HCT 28.4* 09/26/2011 0807   PLT 178 10/30/2011 0520   PLT 230 09/26/2011 0807   MCV 97.4 10/30/2011 0520   MCV 91.0 09/26/2011 0807   NEUTROABS 15.6* 10/29/2011 0126   LYMPHSABS 0.6* 10/29/2011 0126   MONOABS 1.9* 10/29/2011 0126   EOSABS 0.0 10/29/2011 0126   EOSABS 0.1 09/26/2011 0807   BASOSABS 0.0 10/29/2011 0126   BASOSABS 0.0 09/26/2011 0960    Recent Results (from the  past 240 hour(s))  CULTURE, BLOOD (ROUTINE X 2)     Status: Normal (Preliminary result)   Collection Time   10/29/11  1:26 AM      Component Value Range Status Comment   Specimen Description BLOOD RIGHT ANTECUBITAL   Final    Special Requests BOTTLES DRAWN AEROBIC AND ANAEROBIC 4CC   Final    Setup Time [id number]   Final    Culture     Final    Value:        BLOOD CULTURE RECEIVED NO GROWTH TO DATE CULTURE WILL BE HELD FOR 5 DAYS BEFORE ISSUING A FINAL NEGATIVE REPORT   Report Status PENDING   Incomplete   URINE CULTURE     Status: Normal (Preliminary result)   Collection Time   10/29/11  1:48 AM      Component Value Range Status Comment   Specimen Description URINE, CLEAN CATCH   Final    Special Requests NONE   Final    Setup Time [id number]   Final    Colony Count >=100,000 COLONIES/ML   Final    Culture GRAM NEGATIVE RODS   Final    Report Status PENDING   Incomplete     Studies/Results: Dg Chest Portable  1 View  10/29/2011  IMPRESSION: Bilateral patchy airspace disease has developed.  Right pleural effusion is suspected.     Medications: Scheduled Meds:   . acetaminophen      . albuterol  2.5 mg Nebulization QID  . aspirin  81 mg Oral Daily  . azithromycin  500 mg Intravenous Q24H  . cefTRIAXone (ROCEPHIN) IV  1 g Intravenous Q24H  . docusate sodium  100 mg Oral BID  . folic acid  0.5 mg Oral Daily  . metoprolol  50 mg Oral BID  . mirtazapine  15 mg Oral QHS  . pantoprazole  40 mg Oral Daily  . polyethylene glycol  17 g Oral Daily  . potassium chloride  40 mEq Oral Once  . senna-docusate  1 tablet Oral BID  . sucralfate  1 g Oral TID AC & HS  . vitamin C  1,000 mg Oral Daily  . vitamin E  400 Units Oral Daily  . DISCONTD: albuterol  2.5 mg Nebulization Q4H  . DISCONTD: azithromycin  500 mg Intravenous Q24H  . DISCONTD: cefTRIAXone (ROCEPHIN) IV  1 g Intravenous Q24H  . DISCONTD: enoxaparin  40 mg Subcutaneous Q24H  . DISCONTD: folic acid  400 mcg Oral Daily     Continuous Infusions:   . sodium chloride 100 mL/hr at 10/30/11 0600   PRN Meds:.acetaminophen, acetaminophen, albuterol-ipratropium, ALPRAZolam, alum & mag hydroxide-simeth, HYDROcodone-acetaminophen, HYDROmorphone, lidocaine-prilocaine, ondansetron (ZOFRAN) IV, ondansetron, senna, zolpidem, DISCONTD: albuterol  Assessment/Plan:  Active Problems:  PNA (pneumonia) - given patient's immunocompromised status and the fact that he spikes fever overnight to 102.2 Fahrenheit we will broaden the coverage to Vancomycin and Zosyn temporarily until final cultures are received. If patient continues to spike fever we'll consider adding fungal coverage again getting immunocompromised status. Please note that lactic acid was within normal limits and her calcitonin was slightly above the normal range and not indicative of sepsis.   UTI (lower urinary tract infection) - cultures positive for gram-negative rods. Final sensitivities are still pending. As we broaden the coverage as noted in problem above this will also cover gram-negative rods.   Colon Ca - called oncology and they will come by and see patient today. We'll follow up on their recommendations. Patient was mostly concerning patient needs to continue chemotherapy while in the hospital. Per Dr. Truett Perna chemotherapy will most likely not be continued the patient is being actively treated for PNA.   Atrial fibrillation - rate controlled, we'll continue metoprolol 50 mg twice a day   COPD - appears to be stable at that time. We'll continue albuterol nebulizers as noted above.   ANEMIA-IRON DEFICIENCY - hemoglobin and hematocrit remain stable and the patient's baseline.   Hypokalemia - will replete and check bmet in AM.   Hyponatremia - we'll continue IV fluids for now and repeat electrolyte panel in the morning.   Leukocytosis - this is most likely secondary to combination of pneumonia and urinary tract infection. WBC is trending down.    Constipation - continue bowel regimen.    LOS: 1 day   MAGICK-Geanna Divirgilio 10/30/2011, 11:18 AM

## 2011-10-30 NOTE — Progress Notes (Signed)
ANTIBIOTIC CONSULT NOTE - INITIAL  Pharmacy Consult for Vancomycin and Zosyn Indication: rule out pneumonia  Allergies  Allergen Reactions  . Diltiazem Hcl     REACTION: Severe lower extremity edema on generic cardizem  . Penicillins     Pt can tolerate cephalosporins  . Plasma Human     Patient Measurements: Height: 6\' 1"  (185.4 cm) Weight: 192 lb 3.9 oz (87.2 kg) IBW/kg (Calculated) : 79.9    Vital Signs: Temp: 98.5 F (36.9 C) (11/06 1051) Temp src: Oral (11/06 1051) BP: 112/62 mmHg (11/06 1051) Pulse Rate: 72  (11/06 1051) Intake/Output from previous day: 11/05 0701 - 11/06 0700 In: 1519.6 [I.V.:1519.6] Out: -  Intake/Output from this shift: Total I/O In: 240 [P.O.:240] Out: -   Labs:  Basename 10/30/11 0520 10/29/11 1644 10/29/11 0126  WBC 10.9* 13.1* 18.2*  HGB 10.5* 10.0* 11.1*  PLT 178 175 218  LABCREA -- -- --  CREATININE 0.94 1.08 1.00   Estimated Creatinine Clearance: 80.3 ml/min (by C-G formula based on Cr of 0.94). No results found for this basename: VANCOTROUGH:2,VANCOPEAK:2,VANCORANDOM:2,GENTTROUGH:2,GENTPEAK:2,GENTRANDOM:2,TOBRATROUGH:2,TOBRAPEAK:2,TOBRARND:2,AMIKACINPEAK:2,AMIKACINTROU:2,AMIKACIN:2, in the last 72 hours   Microbiology: Recent Results (from the past 720 hour(s))  CULTURE, BLOOD (ROUTINE X 2)     Status: Normal (Preliminary result)   Collection Time   10/29/11  1:26 AM      Component Value Range Status Comment   Specimen Description BLOOD RIGHT ANTECUBITAL   Final    Special Requests BOTTLES DRAWN AEROBIC AND ANAEROBIC 4CC   Final    Setup Time [id number]   Final    Culture     Final    Value:        BLOOD CULTURE RECEIVED NO GROWTH TO DATE CULTURE WILL BE HELD FOR 5 DAYS BEFORE ISSUING A FINAL NEGATIVE REPORT   Report Status PENDING   Incomplete   URINE CULTURE     Status: Normal (Preliminary result)   Collection Time   10/29/11  1:48 AM      Component Value Range Status Comment   Specimen Description URINE, CLEAN CATCH    Final    Special Requests NONE   Final    Setup Time [id number]   Final    Colony Count >=100,000 COLONIES/ML   Final    Culture GRAM NEGATIVE RODS   Final    Report Status PENDING   Incomplete     Medical History: Past Medical History  Diagnosis Date  . History of ETOH abuse     quit 25 yrs ago, heavy etoh 5 yrs  . Colitis   . History of GI diverticular bleed 3/11  . Rectus sheath hematoma 3/10  . Status post Maze operation for atrial fibrillation   . Aortic aneurysm   . 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 S/P colonoscopy 05/03/10    mod diverticulosis sigmoid colon, internal hemorrhoids, suspected diverticular bleed  . Diverticulosis 07/23/2008    colonoscopy by Dr Jena Gauss, hyperplastic polyp  . IDA (iron deficiency anemia)     work-up including small bowel capsule study benign  . CHF (congestive heart failure)     diastolic  . COPD (chronic obstructive pulmonary disease)   . CAD (coronary artery disease)     s/p CABG  . S/P aortic valve replacement   . Cancer right colon T3N2M1  . Weight decrease     20-30 lb in month  . Pneumonia   . History  of colon polyps   . Shortness of breath   . Arthritis     bilateral neck and shoulder pain  . Atrial fibrillation     Medications:  Scheduled:    . acetaminophen      . albuterol  2.5 mg Nebulization QID  . aspirin  81 mg Oral Daily  . docusate sodium  100 mg Oral BID  . folic acid  0.5 mg Oral Daily  . metoprolol  50 mg Oral BID  . mirtazapine  15 mg Oral QHS  . pantoprazole  40 mg Oral Daily  . polyethylene glycol  17 g Oral Daily  . potassium chloride  40 mEq Oral Once  . senna-docusate  1 tablet Oral BID  . sucralfate  1 g Oral TID AC & HS  . vitamin C  1,000 mg Oral Daily  . vitamin E  400 Units Oral Daily  . DISCONTD: albuterol  2.5 mg Nebulization Q4H  . DISCONTD: azithromycin  500 mg Intravenous Q24H  . DISCONTD: azithromycin  500  mg Intravenous Q24H  . DISCONTD: cefTRIAXone (ROCEPHIN) IV  1 g Intravenous Q24H  . DISCONTD: cefTRIAXone (ROCEPHIN) IV  1 g Intravenous Q24H  . DISCONTD: enoxaparin  40 mg Subcutaneous Q24H  . DISCONTD: folic acid  400 mcg Oral Daily   Assessment: 70YOM to begin empiric treatment of suspected PNA with Vancomycin and Zosyn.  Pt received doses of Ceftriaxone and Azithromycin yesterday, which have since been discontinued.  Tm 102.8 yesterday, has been afebrile so far today with mild leukocytosis.  SCr stable with estimated CrCl~49ml/min.  Urine cx with GNR.   Goal of Therapy:  Vancomycin trough level 15-20 mcg/ml  Plan:  Zosyn 3.375g IV q8h (4 hour infusion time). Vancomycin 1g IV q8h - check vancomycin trough tomorrow once at steady state. F/u culture results.  Kyle Love 10/30/2011,11:48 AM

## 2011-10-31 ENCOUNTER — Other Ambulatory Visit: Payer: Medicare Other

## 2011-10-31 ENCOUNTER — Encounter: Payer: Self-pay | Admitting: *Deleted

## 2011-10-31 ENCOUNTER — Telehealth: Payer: Self-pay | Admitting: *Deleted

## 2011-10-31 ENCOUNTER — Inpatient Hospital Stay: Payer: Medicare Other

## 2011-10-31 LAB — BASIC METABOLIC PANEL
CO2: 27 mEq/L (ref 19–32)
Chloride: 99 mEq/L (ref 96–112)
Glucose, Bld: 109 mg/dL — ABNORMAL HIGH (ref 70–99)
Potassium: 3.2 mEq/L — ABNORMAL LOW (ref 3.5–5.1)
Sodium: 133 mEq/L — ABNORMAL LOW (ref 135–145)

## 2011-10-31 LAB — CBC
Hemoglobin: 9.2 g/dL — ABNORMAL LOW (ref 13.0–17.0)
Platelets: 153 10*3/uL (ref 150–400)
RBC: 2.94 MIL/uL — ABNORMAL LOW (ref 4.22–5.81)
WBC: 5.9 10*3/uL (ref 4.0–10.5)

## 2011-10-31 LAB — PROCALCITONIN: Procalcitonin: 0.74 ng/mL

## 2011-10-31 LAB — LACTIC ACID, PLASMA: Lactic Acid, Venous: 1.4 mmol/L (ref 0.5–2.2)

## 2011-10-31 LAB — URINE CULTURE
Colony Count: >=100000
Culture  Setup Time: 201211050850

## 2011-10-31 LAB — VANCOMYCIN, TROUGH: Vancomycin Tr: 19.9 ug/mL (ref 10.0–20.0)

## 2011-10-31 MED ORDER — CIPROFLOXACIN IN D5W 400 MG/200ML IV SOLN
400.0000 mg | Freq: Two times a day (BID) | INTRAVENOUS | Status: DC
  Administered 2011-10-31 – 2011-11-02 (×4): 400 mg via INTRAVENOUS
  Filled 2011-10-31 (×6): qty 200

## 2011-10-31 MED ORDER — POTASSIUM CHLORIDE CRYS ER 20 MEQ PO TBCR
40.0000 meq | EXTENDED_RELEASE_TABLET | ORAL | Status: AC
  Administered 2011-10-31 (×2): 40 meq via ORAL
  Filled 2011-10-31 (×2): qty 2

## 2011-10-31 NOTE — Telephone Encounter (Signed)
Patient's daughter, Lucie Leather called asking why her father has not been followed up during this hospitalization.  "Currently in room 1504 at [facility] with pneumonia, yeast infection and stage IV cancer.  I am fit to be tied because even as a courtesy, no one has come by to see or check on my DAD.  I want his nurse to call me directly at [phone] or I'm about to come to the office."  Expressed that MD is off on Wednesday's but I will give the message to collaborative nurse.

## 2011-10-31 NOTE — Progress Notes (Signed)
ANTIBIOTIC CONSULT NOTE - INITIAL  Pharmacy Consult for Vancomycin and Zosyn Indication: rule out pneumonia  Allergies  Allergen Reactions  . Diltiazem Hcl     REACTION: Severe lower extremity edema on generic cardizem  . Penicillins     Pt can tolerate cephalosporins  . Plasma Human     Patient Measurements: Height: 6\' 1"  (185.4 cm) Weight: 192 lb 3.9 oz (87.2 kg) IBW/kg (Calculated) : 79.9    Vital Signs: Temp: 99.3 F (37.4 C) (11/07 1021) Temp src: Oral (11/07 1021) BP: 133/78 mmHg (11/07 1021) Pulse Rate: 91  (11/07 1021) Intake/Output from previous day: 11/06 0701 - 11/07 0700 In: 1700 [P.O.:600; I.V.:600; IV Piggyback:500] Out: 350 [Urine:350] Intake/Output from this shift: Total I/O In: 240 [P.O.:240] Out: -   Labs:  Basename 10/31/11 0520 10/30/11 0520 10/29/11 1644  WBC 5.9 10.9* 13.1*  HGB 9.2* 10.5* 10.0*  PLT 153 178 175  LABCREA -- -- --  CREATININE 0.92 0.94 1.08   Estimated Creatinine Clearance: 82 ml/min (by C-G formula based on Cr of 0.92).  Basename 10/31/11 1144  VANCOTROUGH 19.9  VANCOPEAK --  VANCORANDOM --  GENTTROUGH --  GENTPEAK --  GENTRANDOM --  TOBRATROUGH --  TOBRAPEAK --  TOBRARND --  AMIKACINPEAK --  AMIKACINTROU --  AMIKACIN --     Microbiology: Recent Results (from the past 720 hour(s))  CULTURE, BLOOD (ROUTINE X 2)     Status: Normal (Preliminary result)   Collection Time   10/29/11  1:26 AM      Component Value Range Status Comment   Specimen Description BLOOD RIGHT ANTECUBITAL   Final    Special Requests BOTTLES DRAWN AEROBIC AND ANAEROBIC 4CC   Final    Setup Time [id number]   Final    Culture     Final    Value:        BLOOD CULTURE RECEIVED NO GROWTH TO DATE CULTURE WILL BE HELD FOR 5 DAYS BEFORE ISSUING A FINAL NEGATIVE REPORT   Report Status PENDING   Incomplete   URINE CULTURE     Status: Normal   Collection Time   10/29/11  1:48 AM      Component Value Range Status Comment   Specimen Description  URINE, CLEAN CATCH   Final    Special Requests NONE   Final    Setup Time [id number]   Final    Colony Count >=100,000 COLONIES/ML   Final    Culture KLEBSIELLA PNEUMONIAE   Final    Report Status 10/31/2011 FINAL   Final    Organism ID, Bacteria KLEBSIELLA PNEUMONIAE   Final     Medical History: Past Medical History  Diagnosis Date  . History of ETOH abuse     quit 25 yrs ago, heavy etoh 5 yrs  . Colitis   . History of GI diverticular bleed 3/11  . Rectus sheath hematoma 3/10  . Status post Maze operation for atrial fibrillation   . Aortic aneurysm   . 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 S/P colonoscopy 05/03/10    mod diverticulosis sigmoid colon, internal hemorrhoids, suspected diverticular bleed  . Diverticulosis 07/23/2008    colonoscopy by Dr Jena Gauss, hyperplastic polyp  . IDA (iron deficiency anemia)     work-up including small bowel capsule study benign  . CHF (congestive heart failure)     diastolic  . COPD (chronic obstructive pulmonary disease)   .  CAD (coronary artery disease)     s/p CABG  . S/P aortic valve replacement   . Cancer right colon T3N2M1  . Weight decrease     20-30 lb in month  . Pneumonia   . History of colon polyps   . Shortness of breath   . Arthritis     bilateral neck and shoulder pain  . Atrial fibrillation     Medications:  Scheduled:     . albuterol  2.5 mg Nebulization QID  . aspirin  81 mg Oral Daily  . docusate sodium  100 mg Oral BID  . fluconazole (DIFLUCAN) IV  400 mg Intravenous Q24H  . folic acid  0.5 mg Oral Daily  . metoprolol  50 mg Oral BID  . mirtazapine  15 mg Oral QHS  . pantoprazole  40 mg Oral Daily  . piperacillin-tazobactam (ZOSYN)  IV  3.375 g Intravenous Q8H  . polyethylene glycol  17 g Oral Daily  . senna-docusate  1 tablet Oral BID  . sucralfate  1 g Oral TID AC & HS  . vancomycin  1,000 mg Intravenous Q8H  . vitamin C  1,000  mg Oral Daily  . vitamin E  400 Units Oral Daily   Assessment: 70YOM on day #2/x Vancomycin 1g IV q8h and Zosyn 3.375g IV q8h (extended interval) for empiric treatment of suspected PNA.    Vancomycin trough 19.9 (goal 15-20) and SCr stable so will continue with current abx doses.   Also receiving Fluconazole 400mg  IV q24h empirically considering continued fevers and immunocompromised status (colon CA).  Tm 101.8 yesterday, wbc now wnl.    11/5 Urine cx with Klebsiella pneumoniae - resistant to Bactrim and Ampicillin only. Covered with Zosyn - cannot deescalate abx until we can r/o PNA.  11/5 Blood cx ngtd.  11/5 CXR: Bilateral patchy airspace disease.  Goal of Therapy:  Vancomycin trough level 15-20 mcg/ml  Plan:  Continue Zosyn 3.375g IV q8h (4 hour infusion time) and Vancomycin 1g IV q8h. F/u culture results.  Clance Boll 10/31/2011,1:01 PM

## 2011-10-31 NOTE — Progress Notes (Signed)
Returned call to daughter.  Notified her Dr. Truett Perna will see her Dad today per collaborative nurse.  Room number is 1504 at [facility].

## 2011-10-31 NOTE — Progress Notes (Signed)
Subjective: Patient states his shortness of breath has improved somewhat. Some improvement with cough. Overall feeling a little bit better.  Objective: Vital signs in last 24 hours: Filed Vitals:   10/31/11 0225 10/31/11 0540 10/31/11 0750 10/31/11 1021  BP:  117/65  133/78  Pulse:  100  91  Temp: 98.3 F (36.8 C) 99.4 F (37.4 C)  99.3 F (37.4 C)  TempSrc:    Oral  Resp:  19  16  Height:      Weight:      SpO2:  92% 95% 97%    Intake/Output Summary (Last 24 hours) at 10/31/11 1320 Last data filed at 10/31/11 0941  Gross per 24 hour  Intake   1460 ml  Output    350 ml  Net   1110 ml    Weight change:   General: Alert, awake, oriented x3, in no acute distress Heart: Regular rate and rhythm, without murmurs, rubs, gallops. Lungs: Coarse BS bibasilar. Abdomen: Soft, nontender, nondistended, positive bowel sounds. Extremities: No clubbing cyanosis or edema with positive pedal pulses. Neuro: Grossly intact, nonfocal.   Lab Results: Results for orders placed during the hospital encounter of 10/29/11 (from the past 24 hour(s))  LACTIC ACID, PLASMA     Status: Normal   Collection Time   10/30/11 11:30 PM      Component Value Range   Lactic Acid, Venous 1.4  0.5 - 2.2 (mmol/L)  PROCALCITONIN     Status: Normal   Collection Time   10/30/11 11:30 PM      Component Value Range   Procalcitonin 0.74    CBC     Status: Abnormal   Collection Time   10/31/11  5:20 AM      Component Value Range   WBC 5.9  4.0 - 10.5 (K/uL)   RBC 2.94 (*) 4.22 - 5.81 (MIL/uL)   Hemoglobin 9.2 (*) 13.0 - 17.0 (g/dL)   HCT 04.5 (*) 40.9 - 52.0 (%)   MCV 96.6  78.0 - 100.0 (fL)   MCH 31.3  26.0 - 34.0 (pg)   MCHC 32.4  30.0 - 36.0 (g/dL)   RDW 81.1 (*) 91.4 - 15.5 (%)   Platelets 153  150 - 400 (K/uL)  BASIC METABOLIC PANEL     Status: Abnormal   Collection Time   10/31/11  5:20 AM      Component Value Range   Sodium 133 (*) 135 - 145 (mEq/L)   Potassium 3.2 (*) 3.5 - 5.1 (mEq/L)   Chloride 99  96 - 112 (mEq/L)   CO2 27  19 - 32 (mEq/L)   Glucose, Bld 109 (*) 70 - 99 (mg/dL)   BUN 14  6 - 23 (mg/dL)   Creatinine, Ser 7.82  0.50 - 1.35 (mg/dL)   Calcium 8.6  8.4 - 95.6 (mg/dL)   GFR calc non Af Amer 82 (*) >90 (mL/min)   GFR calc Af Amer >90  >90 (mL/min)  VANCOMYCIN, TROUGH     Status: Normal   Collection Time   10/31/11 11:44 AM      Component Value Range   Vancomycin Tr 19.9  10.0 - 20.0 (ug/mL)   Micro: Recent Results (from the past 240 hour(s))  CULTURE, BLOOD (ROUTINE X 2)     Status: Normal (Preliminary result)   Collection Time   10/29/11  1:26 AM      Component Value Range Status Comment   Specimen Description BLOOD RIGHT ANTECUBITAL   Final    Special  Requests BOTTLES DRAWN AEROBIC AND ANAEROBIC 4CC   Final    Setup Time [id number]   Final    Culture     Final    Value:        BLOOD CULTURE RECEIVED NO GROWTH TO DATE CULTURE WILL BE HELD FOR 5 DAYS BEFORE ISSUING A FINAL NEGATIVE REPORT   Report Status PENDING   Incomplete   URINE CULTURE     Status: Normal   Collection Time   10/29/11  1:48 AM      Component Value Range Status Comment   Specimen Description URINE, CLEAN CATCH   Final    Special Requests NONE   Final    Setup Time [id number]   Final    Colony Count >=100,000 COLONIES/ML   Final    Culture KLEBSIELLA PNEUMONIAE   Final    Report Status 10/31/2011 FINAL   Final    Organism ID, Bacteria KLEBSIELLA PNEUMONIAE   Final     Studies/Results: No results found.  Medications:     . albuterol  2.5 mg Nebulization QID  . aspirin  81 mg Oral Daily  . docusate sodium  100 mg Oral BID  . fluconazole (DIFLUCAN) IV  400 mg Intravenous Q24H  . folic acid  0.5 mg Oral Daily  . metoprolol  50 mg Oral BID  . mirtazapine  15 mg Oral QHS  . pantoprazole  40 mg Oral Daily  . piperacillin-tazobactam (ZOSYN)  IV  3.375 g Intravenous Q8H  . polyethylene glycol  17 g Oral Daily  . senna-docusate  1 tablet Oral BID  . sucralfate  1 g Oral  TID AC & HS  . vancomycin  1,000 mg Intravenous Q8H  . vitamin C  1,000 mg Oral Daily  . vitamin E  400 Units Oral Daily   Assessment:/Plan  PNA (pneumonia)  Atrial fibrillation  COPD  ANEMIA-IRON DEFICIENCY  Constipation  UTI (lower urinary tract infection)  Hypokalemia  Hyponatremia  Leukocytosis    1.PNA (pneumonia) - Pt with tmax of 102.3 last night. Clinical improvement. Given patient's immunocompromised status and the fact that he spikes fever overnight to 102.3 Fahrenheit we will continue2. Klebsiell Vancomycin and Zosyn temporarily until final cultures are received. If patient continues to spike fever we'll consider adding fungal coverage again getting immunocompromised status. Please note that lactic acid was within normal limits and her calcitonin was slightly above the normal range and not indicative of sepsis.  2. Klebsiella Pneumoniae UTI (lower urinary tract infection) - sensitivities resistant to ampicillin. Will add IV Cipro 2 current coverage.  3.Colon Ca - called oncology and they will come by and see patient today. We'll follow up on their recommendations. Patient was mostly concerning patient needs to continue chemotherapy while in the hospital. Per Dr. Truett Perna chemotherapy will most likely not be continued the patient is being actively treated for PNA.  4.Atrial fibrillation - rate controlled, we'll continue metoprolol 50 mg twice a day  5.COPD - appears to be stable at that time. We'll continue albuterol nebulizers as noted above.  6.ANEMIA-IRON DEFICIENCY - hemoglobin and hematocrit remain stable and the patient's baseline.  7.Hypokalemia - will replete and check bmet in AM.  8.Hyponatremia - we'll continue IV fluids for now and repeat electrolyte panel in the morning.  9.Leukocytosis - this is most likely secondary to combination of pneumonia and urinary tract infection. WBC is trending down. Will add IV Cipro to our current regimen as an urine cultures and  sensitivities resistant  to ampicillin. 10.Constipation - continue bowel regimen.    LOS: 2 days   Kyle Love 10/31/2011, 1:20 PM

## 2011-11-01 ENCOUNTER — Ambulatory Visit: Payer: Medicare Other | Admitting: Cardiovascular Disease

## 2011-11-01 LAB — BASIC METABOLIC PANEL
Chloride: 104 mEq/L (ref 96–112)
GFR calc Af Amer: >90 mL/min (ref >90)
GFR calc non Af Amer: 80 mL/min — ABNORMAL LOW (ref >90)
Glucose, Bld: 110 mg/dL — ABNORMAL HIGH (ref 70–99)
Potassium: 3.7 mEq/L (ref 3.5–5.1)
Sodium: 136 mEq/L (ref 135–145)

## 2011-11-01 LAB — CBC
Hemoglobin: 9.4 g/dL — ABNORMAL LOW (ref 13.0–17.0)
MCHC: 32.4 g/dL (ref 30.0–36.0)
RBC: 3 MIL/uL — ABNORMAL LOW (ref 4.22–5.81)
WBC: 4.5 10*3/uL (ref 4.0–10.5)

## 2011-11-01 MED ORDER — METOPROLOL TARTRATE 50 MG PO TABS
75.0000 mg | ORAL_TABLET | Freq: Two times a day (BID) | ORAL | Status: DC
  Administered 2011-11-01 – 2011-11-03 (×4): 75 mg via ORAL
  Filled 2011-11-01 (×5): qty 1

## 2011-11-01 MED ORDER — METOPROLOL TARTRATE 25 MG PO TABS
25.0000 mg | ORAL_TABLET | ORAL | Status: AC
  Administered 2011-11-01: 25 mg via ORAL
  Filled 2011-11-01: qty 1

## 2011-11-01 NOTE — Progress Notes (Signed)
Subjective: Patient is feeling better. Patient states shortness of breath has improved.  Objective: Vital signs in last 24 hours: Filed Vitals:   11/01/11 0225 11/01/11 0600 11/01/11 0738 11/01/11 1000  BP: 157/80 159/91  145/69  Pulse: 103 100  101  Temp: 98.6 F (37 C) 98.2 F (36.8 C)  97.9 F (36.6 C)  TempSrc: Oral Oral  Oral  Resp: 18 18  18   Height:      Weight:      SpO2: 96% 94% 97% 100%    Intake/Output Summary (Last 24 hours) at 11/01/11 1142 Last data filed at 11/01/11 0955  Gross per 24 hour  Intake   3455 ml  Output      0 ml  Net   3455 ml    Weight change:   General: Alert, awake, oriented x3, in no acute distress. Heart: Regular rate and rhythm, without murmurs, rubs, gallops. Lungs: Coarse BS in bases bilaterally. Abdomen: Soft, nontender, nondistended, positive bowel sounds. Extremities: No clubbing cyanosis or edema with positive pedal pulses. Neuro: Grossly intact, nonfocal.   Lab Results:  Basename 11/01/11 0545 10/31/11 0520  NA 136 133*  K 3.7 3.2*  CL 104 99  CO2 24 27  GLUCOSE 110* 109*  BUN 10 14  CREATININE 0.98 0.92  CALCIUM 8.8 8.6  MG 1.7 --  PHOS -- --   No results found for this basename: AST:2,ALT:2,ALKPHOS:2,BILITOT:2,PROT:2,ALBUMIN:2 in the last 72 hours No results found for this basename: LIPASE:2,AMYLASE:2 in the last 72 hours  Basename 11/01/11 0545 10/31/11 0520  WBC 4.5 5.9  NEUTROABS -- --  HGB 9.4* 9.2*  HCT 29.0* 28.4*  MCV 96.7 96.6  PLT 173 153   No results found for this basename: CKTOTAL:3,CKMB:3,CKMBINDEX:3,TROPONINI:3 in the last 72 hours No results found for this basename: POCBNP:3 in the last 72 hours No results found for this basename: DDIMER:2 in the last 72 hours No results found for this basename: HGBA1C:2 in the last 72 hours No results found for this basename: CHOL:2,HDL:2,LDLCALC:2,TRIG:2,CHOLHDL:2,LDLDIRECT:2 in the last 72 hours No results found for this basename:  TSH,T4TOTAL,FREET3,T3FREE,THYROIDAB in the last 72 hours No results found for this basename: VITAMINB12:2,FOLATE:2,FERRITIN:2,TIBC:2,IRON:2,RETICCTPCT:2 in the last 72 hours  Micro Results: Recent Results (from the past 240 hour(s))  CULTURE, BLOOD (ROUTINE X 2)     Status: Normal (Preliminary result)   Collection Time   10/29/11  1:26 AM      Component Value Range Status Comment   Specimen Description BLOOD RIGHT ANTECUBITAL   Final    Special Requests BOTTLES DRAWN AEROBIC AND ANAEROBIC 4CC   Final    Setup Time [id number]   Final    Culture     Final    Value:        BLOOD CULTURE RECEIVED NO GROWTH TO DATE CULTURE WILL BE HELD FOR 5 DAYS BEFORE ISSUING A FINAL NEGATIVE REPORT   Report Status PENDING   Incomplete   URINE CULTURE     Status: Normal   Collection Time   10/29/11  1:48 AM      Component Value Range Status Comment   Specimen Description URINE, CLEAN CATCH   Final    Special Requests NONE   Final    Setup Time [id number]   Final    Colony Count >=100,000 COLONIES/ML   Final    Culture KLEBSIELLA PNEUMONIAE   Final    Report Status 10/31/2011 FINAL   Final    Organism ID, Bacteria KLEBSIELLA PNEUMONIAE   Final  CULTURE, BLOOD (ROUTINE X 2)     Status: Normal (Preliminary result)   Collection Time   10/30/11 11:30 PM      Component Value Range Status Comment   Specimen Description BLOOD RIGHT ANTECUBITAL   Final    Special Requests BOTTLES DRAWN AEROBIC AND ANAEROBIC 4CC   Final    Setup Time [id number]   Final    Culture     Final    Value:        BLOOD CULTURE RECEIVED NO GROWTH TO DATE CULTURE WILL BE HELD FOR 5 DAYS BEFORE ISSUING A FINAL NEGATIVE REPORT   Report Status PENDING   Incomplete   CULTURE, BLOOD (ROUTINE X 2)     Status: Normal (Preliminary result)   Collection Time   10/30/11 11:33 PM      Component Value Range Status Comment   Specimen Description BLOOD RIGHT HAND   Final    Special Requests BOTTLES DRAWN AEROBIC AND ANAEROBIC 3CC   Final     Setup Time [id number]   Final    Culture     Final    Value:        BLOOD CULTURE RECEIVED NO GROWTH TO DATE CULTURE WILL BE HELD FOR 5 DAYS BEFORE ISSUING A FINAL NEGATIVE REPORT   Report Status PENDING   Incomplete     Studies/Results: No results found.  Medications:     . albuterol  2.5 mg Nebulization QID  . aspirin  81 mg Oral Daily  . ciprofloxacin  400 mg Intravenous Q12H  . docusate sodium  100 mg Oral BID  . fluconazole (DIFLUCAN) IV  400 mg Intravenous Q24H  . folic acid  0.5 mg Oral Daily  . metoprolol  75 mg Oral BID  . mirtazapine  15 mg Oral QHS  . pantoprazole  40 mg Oral Daily  . piperacillin-tazobactam (ZOSYN)  IV  3.375 g Intravenous Q8H  . polyethylene glycol  17 g Oral Daily  . potassium chloride  40 mEq Oral Q4H  . senna-docusate  1 tablet Oral BID  . sucralfate  1 g Oral TID AC & HS  . vancomycin  1,000 mg Intravenous Q8H  . vitamin C  1,000 mg Oral Daily  . vitamin E  400 Units Oral Daily  . DISCONTD: metoprolol  50 mg Oral BID    Assessment/Plan  1.PNA (pneumonia) - Pt afebrile. Clinical improvement. Continue IV Vancomycin, IV Cipro and Zosyn temporarily until final cultures are received.  Please note that lactic acid was within normal limits and her calcitonin was slightly above the normal range and not indicative of sepsis. Blood cultures continued to remain negative, and patient remains afebrile, will need to narrow antibiotic coverage. 2. Klebsiella Pneumoniae UTI (lower urinary tract infection) - sensitivities resistant to ampicillin. Continue IV Cipro D2/7 .  3.Colon Ca - per wife the patient was seen by oncology this morning and recommend resumption of chemotherapy as an outpatient in the next one to 2 weeks . Per Dr. Truett Perna chemotherapy will most likely not be continued while the patient is being actively treated for PNA.  4.Atrial fibrillation - increase metoprolol to home dose of 75 mg twice a day, Aspirin.  5.COPD - appears to be stable at  that time. We'll continue albuterol nebulizers as noted above.  6.ANEMIA-IRON DEFICIENCY - hemoglobin and hematocrit remain stable and the patient's baseline.  7.Hypokalemia - will replete and check bmet in AM.  8.Hyponatremia - we'll continue IV fluids for now and  repeat electrolyte panel in the morning.  9.Leukocytosis - this is most likely secondary to combination of pneumonia and urinary tract infection. WBC is trending down. : Continue current antibiotic coverage. 10. HTN- increase metoprolol to home dose of 75 mg twice daily. 10.Constipation - continue bowel regimen.       LOS: 3 days   THOMPSON,DANIEL 11/01/2011, 11:42 AM

## 2011-11-02 ENCOUNTER — Inpatient Hospital Stay (HOSPITAL_COMMUNITY): Payer: Medicare Other

## 2011-11-02 DIAGNOSIS — A419 Sepsis, unspecified organism: Secondary | ICD-10-CM

## 2011-11-02 DIAGNOSIS — T451X5A Adverse effect of antineoplastic and immunosuppressive drugs, initial encounter: Secondary | ICD-10-CM

## 2011-11-02 DIAGNOSIS — A4159 Other Gram-negative sepsis: Secondary | ICD-10-CM

## 2011-11-02 DIAGNOSIS — C182 Malignant neoplasm of ascending colon: Secondary | ICD-10-CM

## 2011-11-02 LAB — CBC
MCH: 31 pg (ref 26.0–34.0)
MCV: 96.5 fL (ref 78.0–100.0)
Platelets: 168 10*3/uL (ref 150–400)
RDW: 17.3 % — ABNORMAL HIGH (ref 11.5–15.5)
WBC: 5 10*3/uL (ref 4.0–10.5)

## 2011-11-02 LAB — VANCOMYCIN, TROUGH: Vancomycin Tr: 57.6 ug/mL (ref 10.0–20.0)

## 2011-11-02 LAB — BASIC METABOLIC PANEL
Calcium: 8.7 mg/dL (ref 8.4–10.5)
Creatinine, Ser: 0.94 mg/dL (ref 0.50–1.35)
GFR calc Af Amer: >90 mL/min (ref >90)

## 2011-11-02 LAB — CREATININE, SERUM: Creatinine, Ser: 0.93 mg/dL (ref 0.50–1.35)

## 2011-11-02 MED ORDER — LEVOFLOXACIN IN D5W 750 MG/150ML IV SOLN
750.0000 mg | INTRAVENOUS | Status: DC
  Administered 2011-11-02: 750 mg via INTRAVENOUS
  Filled 2011-11-02 (×2): qty 150

## 2011-11-02 MED ORDER — POTASSIUM CHLORIDE CRYS ER 20 MEQ PO TBCR
40.0000 meq | EXTENDED_RELEASE_TABLET | Freq: Once | ORAL | Status: AC
  Administered 2011-11-02: 40 meq via ORAL
  Filled 2011-11-02: qty 2

## 2011-11-02 NOTE — Progress Notes (Signed)
IP PROGRESS NOTE  Subjective:   He reports feeling better compared to hospital admission. He continues to have shortness of breath. The fever has resolved.  Objective: Vital signs in last 24 hours: Blood pressure 152/77, pulse 98, temperature 98 F (36.7 C), temperature source Oral, resp. rate 18, height 6\' 1"  (1.854 m), weight 192 lb 3.9 oz (87.2 kg), SpO2 94.00%.  Intake/Output from previous day: 11/08 0701 - 11/09 0700 In: 4805 [P.O.:720; I.V.:2485; IV Piggyback:1600] Out: -   Physical Exam:  HEENT: No thrush Lungs: Decreased breath sounds with inspiratory rhonchi at the lower posterior chest bilaterally. Inspiratory rub/rhonchi at the right anterior chest. No respiratory distress. Cardiac: Regular rhythm Abdomen: Nontender Extremities: No leg edema   Portacath/PICC-without erythema  Lab Results:  Basename 11/02/11 0520 11/01/11 0545  WBC 5.0 4.5  HGB 8.9* 9.4*  HCT 27.7* 29.0*  PLT 168 173    BMET  Basename 11/02/11 0520 11/01/11 0545  NA 139 136  K 3.3* 3.7  CL 106 104  CO2 26 24  GLUCOSE 109* 110*  BUN 7 10  CREATININE 0.930.94 0.98  CALCIUM 8.7 8.8    Studies/Results: No results found.  Medications: I have reviewed the patient's current medications.  Assessment/Plan:  Patient Active Hospital Problem List: PNA (pneumonia) (10/29/2011)   Assessment: He appears to be improving    Plan: Continue antibiotics per the medical service. Consider repeat chest x-ray.   COPD (03/31/2009)   Assessment: Medical therapy for the medicine service.    ANEMIA-IRON DEFICIENCY (01/25/2011)   Assessment: The anemia is secondary to "chronic disease", chemotherapy, and probably phlebotomy.    Plan: Monitor hemoglobin as an outpatient  UTI (lower urinary tract infection) (10/29/2011)   Assessment: Klebsiella urinary tract infection    Plan: Antibiotics per the medicine service.  Leukocytosis (10/30/2011)   Assessment: Likely secondary to pneumonia, improved.      Metastatic colon cancer-he has been treated with FOLFOX chemotherapy. There has been clinical and x-ray in improvement. The plan is to resume FOLFOX chemotherapy as an outpatient. We will plan to resume chemotherapy on November 14 if his clinical status continues to improve.  Right pleural effusion-potentially related to cardiac disease, pneumonia, or metastatic colon cancer. It would be unusual to develop a progressive malignant pleural effusion while on effective chemotherapy.  Please call oncology as needed. I will arrange for outpatient followup.     LOS: 4 days   Johnnette Laux BRADLEY  11/02/2011, 9:06 AM

## 2011-11-02 NOTE — Progress Notes (Signed)
Subjective: And states he doesn't feel as good as he did yesterday. No other complaints.  Objective: Vital signs in last 24 hours: Filed Vitals:   11/01/11 2152 11/02/11 0305 11/02/11 0605 11/02/11 0818  BP: 154/80 148/69 152/77   Pulse: 92 99 98   Temp: 98.8 F (37.1 C) 98.3 F (36.8 C) 98 F (36.7 C)   TempSrc: Oral Oral Oral   Resp: 20 18 18    Height:      Weight:      SpO2: 91% 95% 95% 94%    Intake/Output Summary (Last 24 hours) at 11/02/11 1050 Last data filed at 11/02/11 0932  Gross per 24 hour  Intake   5045 ml  Output      0 ml  Net   5045 ml    Weight change:   General: Alert, awake, oriented x3, in no acute distress. HEENT: No bruits, no goiter. Heart: Regular rate and rhythm, without murmurs, rubs, gallops. Lungs: Coarse bibasilar BS/rhonchi Abdomen: Soft, nontender, nondistended, positive bowel sounds. Extremities: No clubbing cyanosis or edema with positive pedal pulses. Neuro: Grossly intact, nonfocal.   Lab Results:  Basename 11/02/11 0520 11/01/11 0545  NA 139 136  K 3.3* 3.7  CL 106 104  CO2 26 24  GLUCOSE 109* 110*  BUN 7 10  CREATININE 0.930.94 0.98  CALCIUM 8.7 8.8  MG -- 1.7  PHOS -- --   No results found for this basename: AST:2,ALT:2,ALKPHOS:2,BILITOT:2,PROT:2,ALBUMIN:2 in the last 72 hours No results found for this basename: LIPASE:2,AMYLASE:2 in the last 72 hours  Basename 11/02/11 0520 11/01/11 0545  WBC 5.0 4.5  NEUTROABS -- --  HGB 8.9* 9.4*  HCT 27.7* 29.0*  MCV 96.5 96.7  PLT 168 173   No results found for this basename: CKTOTAL:3,CKMB:3,CKMBINDEX:3,TROPONINI:3 in the last 72 hours No results found for this basename: POCBNP:3 in the last 72 hours No results found for this basename: DDIMER:2 in the last 72 hours No results found for this basename: HGBA1C:2 in the last 72 hours No results found for this basename: CHOL:2,HDL:2,LDLCALC:2,TRIG:2,CHOLHDL:2,LDLDIRECT:2 in the last 72 hours No results found for this  basename: TSH,T4TOTAL,FREET3,T3FREE,THYROIDAB in the last 72 hours No results found for this basename: VITAMINB12:2,FOLATE:2,FERRITIN:2,TIBC:2,IRON:2,RETICCTPCT:2 in the last 72 hours  Micro Results: Recent Results (from the past 240 hour(s))  CULTURE, BLOOD (ROUTINE X 2)     Status: Normal (Preliminary result)   Collection Time   10/29/11  1:26 AM      Component Value Range Status Comment   Specimen Description BLOOD RIGHT ANTECUBITAL   Final    Special Requests BOTTLES DRAWN AEROBIC AND ANAEROBIC 4CC   Final    Setup Time [id number]   Final    Culture     Final    Value:        BLOOD CULTURE RECEIVED NO GROWTH TO DATE CULTURE WILL BE HELD FOR 5 DAYS BEFORE ISSUING A FINAL NEGATIVE REPORT   Report Status PENDING   Incomplete   URINE CULTURE     Status: Normal   Collection Time   10/29/11  1:48 AM      Component Value Range Status Comment   Specimen Description URINE, CLEAN CATCH   Final    Special Requests NONE   Final    Setup Time [id number]   Final    Colony Count >=100,000 COLONIES/ML   Final    Culture KLEBSIELLA PNEUMONIAE   Final    Report Status 10/31/2011 FINAL   Final    Organism ID,  Bacteria KLEBSIELLA PNEUMONIAE   Final   CULTURE, BLOOD (ROUTINE X 2)     Status: Normal (Preliminary result)   Collection Time   10/30/11 11:30 PM      Component Value Range Status Comment   Specimen Description BLOOD RIGHT ANTECUBITAL   Final    Special Requests BOTTLES DRAWN AEROBIC AND ANAEROBIC 4CC   Final    Setup Time [id number]   Final    Culture     Final    Value:        BLOOD CULTURE RECEIVED NO GROWTH TO DATE CULTURE WILL BE HELD FOR 5 DAYS BEFORE ISSUING A FINAL NEGATIVE REPORT   Report Status PENDING   Incomplete   CULTURE, BLOOD (ROUTINE X 2)     Status: Normal (Preliminary result)   Collection Time   10/30/11 11:33 PM      Component Value Range Status Comment   Specimen Description BLOOD RIGHT HAND   Final    Special Requests BOTTLES DRAWN AEROBIC AND ANAEROBIC 3CC    Final    Setup Time [id number]   Final    Culture     Final    Value:        BLOOD CULTURE RECEIVED NO GROWTH TO DATE CULTURE WILL BE HELD FOR 5 DAYS BEFORE ISSUING A FINAL NEGATIVE REPORT   Report Status PENDING   Incomplete     Studies/Results: No results found.  Medications:     . albuterol  2.5 mg Nebulization QID  . aspirin  81 mg Oral Daily  . ciprofloxacin  400 mg Intravenous Q12H  . docusate sodium  100 mg Oral BID  . fluconazole (DIFLUCAN) IV  400 mg Intravenous Q24H  . folic acid  0.5 mg Oral Daily  . metoprolol tartrate  25 mg Oral NOW  . metoprolol  75 mg Oral BID  . mirtazapine  15 mg Oral QHS  . pantoprazole  40 mg Oral Daily  . piperacillin-tazobactam (ZOSYN)  IV  3.375 g Intravenous Q8H  . polyethylene glycol  17 g Oral Daily  . potassium chloride  40 mEq Oral Once  . senna-docusate  1 tablet Oral BID  . sucralfate  1 g Oral TID AC & HS  . vancomycin  1,000 mg Intravenous Q8H  . vitamin C  1,000 mg Oral Daily  . vitamin E  400 Units Oral Daily  . DISCONTD: metoprolol  50 mg Oral BID    Assessment/Plan Principal Problem:  *PNA (pneumonia) Active Problems:  Atrial fibrillation  COPD  ANEMIA-IRON DEFICIENCY  Constipation  UTI (lower urinary tract infection)  Hypokalemia  Hyponatremia  Leukocytosis  1.PNA (pneumonia) - Pt afebrile x 48 hours. Cultures pending. Please note that lactic acid was within normal limits and her calcitonin was slightly above the normal range and not indicative of sepsis. Blood cultures continued to remain negative, and patient remains afebrile, will  narrow antibiotic coverage. D/C vanc and Zosyn and cipro. Place on IV levaquin. Repeat CXR. 2. Klebsiella Pneumoniae UTI (lower urinary tract infection) - sensitivities resistant to ampicillin. Change IV Cipro to levaquin to cover both PNA and UTI. Antibiotic  D3/7 .  3.Colon Ca - per wife the patient was seen by oncology this morning and recommend resumption of chemotherapy as an  outpatient in the next one to 2 weeks . Per Dr. Truett Perna chemotherapy will be resumed as outpatient. 4.Atrial fibrillation - Continue metoprolol for rate control, Aspirin.  5.COPD - appears to be stable at that time. We'll  continue albuterol nebulizers as noted above.  6.ANEMIA-IRON DEFICIENCY -  Secondary to chronic disease, chemotherapy. hemoglobin and hematocrit remain stable and the patient's baseline.  7.Hypokalemia - will replete and check bmet in AM.  8.Hyponatremia - we'll continue IV fluids for now and repeat electrolyte panel in the morning.  9.Leukocytosis - this is most likely secondary to combination of pneumonia and urinary tract infection. WBC is trending down. : Continue current antibiotic coverage.  10. HTN- Continue current regimen.  10.Constipation -  Resolved. continue bowel regimen.        LOS: 4 days   THOMPSON,DANIEL 11/02/2011, 10:50 AM

## 2011-11-02 NOTE — Progress Notes (Signed)
[id number]/Carmine Youngberg, RN, BSN, CCM/CHART REVIEW FOR UR PERFORMED. 

## 2011-11-03 LAB — BASIC METABOLIC PANEL
BUN: 7 mg/dL (ref 6–23)
CO2: 26 mEq/L (ref 19–32)
Chloride: 107 mEq/L (ref 96–112)
Creatinine, Ser: 1.03 mg/dL (ref 0.50–1.35)
GFR calc Af Amer: 82 mL/min — ABNORMAL LOW (ref >90)

## 2011-11-03 LAB — CBC
HCT: 28.4 % — ABNORMAL LOW (ref 39.0–52.0)
MCH: 31.3 pg (ref 26.0–34.0)
MCV: 96.6 fL (ref 78.0–100.0)
RDW: 17.7 % — ABNORMAL HIGH (ref 11.5–15.5)
WBC: 5.8 10*3/uL (ref 4.0–10.5)

## 2011-11-03 MED ORDER — FUROSEMIDE 10 MG/ML IJ SOLN: 60.0000 mg | Freq: Once | INTRAMUSCULAR | Status: DC

## 2011-11-03 MED ORDER — FUROSEMIDE 10 MG/ML IJ SOLN
60.0000 mg | Freq: Two times a day (BID) | INTRAMUSCULAR | Status: AC
  Administered 2011-11-03: 60 mg via INTRAVENOUS
  Administered 2011-11-04: 40 mg via INTRAVENOUS
  Filled 2011-11-03: qty 6

## 2011-11-03 MED ORDER — LEVOFLOXACIN 750 MG PO TABS
750.0000 mg | ORAL_TABLET | Freq: Every day | ORAL | Status: DC
  Administered 2011-11-03 – 2011-11-05 (×3): 750 mg via ORAL
  Filled 2011-11-03 (×3): qty 1

## 2011-11-03 MED ORDER — METOPROLOL TARTRATE 100 MG PO TABS
100.0000 mg | ORAL_TABLET | Freq: Two times a day (BID) | ORAL | Status: DC
  Administered 2011-11-03: 25 mg via ORAL
  Administered 2011-11-03 – 2011-11-05 (×4): 100 mg via ORAL
  Filled 2011-11-03 (×5): qty 1

## 2011-11-03 NOTE — Progress Notes (Signed)
Subjective: Patient denies any complaints. However patient's wife states that patient was fatigued after ambulating in the hallway last night.  Objective: Vital signs in last 24 hours: Filed Vitals:   11/02/11 2059 11/03/11 0632 11/03/11 0848 11/03/11 1000  BP: 159/83 154/84  147/80  Pulse: 101 99  101  Temp: 98 F (36.7 C) 97.8 F (36.6 C)  97.8 F (36.6 C)  TempSrc: Oral Oral  Oral  Resp: 18 20  20   Height:      Weight:      SpO2: 99% 94% 94% 93%    Intake/Output Summary (Last 24 hours) at 11/03/11 1112 Last data filed at 11/03/11 1031  Gross per 24 hour  Intake    840 ml  Output      0 ml  Net    840 ml    Weight change:   General: Alert, awake, oriented x3, in no acute distress Heart: Regular rate and rhythm, without murmurs, rubs, gallops. Lungs: Coarse BS bibasilarlly Abdomen: Soft, nontender, nondistended, positive bowel sounds. Extremities: No clubbing cyanosis or edema with positive pedal pulses. Neuro: Grossly intact, nonfocal.   Lab Results:  Basename 11/03/11 0528 11/02/11 0520 11/01/11 0545  NA 140 139 --  K 3.9 3.3* --  CL 107 106 --  CO2 26 26 --  GLUCOSE 88 109* --  BUN 7 7 --  CREATININE 1.03 0.930.94 --  CALCIUM 9.3 8.7 --  MG -- -- 1.7  PHOS -- -- --   No results found for this basename: AST:2,ALT:2,ALKPHOS:2,BILITOT:2,PROT:2,ALBUMIN:2 in the last 72 hours No results found for this basename: LIPASE:2,AMYLASE:2 in the last 72 hours  Basename 11/03/11 0528 11/02/11 0520  WBC 5.8 5.0  NEUTROABS -- --  HGB 9.2* 8.9*  HCT 28.4* 27.7*  MCV 96.6 96.5  PLT 206 168   No results found for this basename: CKTOTAL:3,CKMB:3,CKMBINDEX:3,TROPONINI:3 in the last 72 hours No results found for this basename: POCBNP:3 in the last 72 hours No results found for this basename: DDIMER:2 in the last 72 hours No results found for this basename: HGBA1C:2 in the last 72 hours No results found for this basename:  CHOL:2,HDL:2,LDLCALC:2,TRIG:2,CHOLHDL:2,LDLDIRECT:2 in the last 72 hours No results found for this basename: TSH,T4TOTAL,FREET3,T3FREE,THYROIDAB in the last 72 hours No results found for this basename: VITAMINB12:2,FOLATE:2,FERRITIN:2,TIBC:2,IRON:2,RETICCTPCT:2 in the last 72 hours  Micro Results: Recent Results (from the past 240 hour(s))  CULTURE, BLOOD (ROUTINE X 2)     Status: Normal (Preliminary result)   Collection Time   10/29/11  1:26 AM      Component Value Range Status Comment   Specimen Description BLOOD RIGHT ANTECUBITAL   Final    Special Requests BOTTLES DRAWN AEROBIC AND ANAEROBIC 4CC   Final    Setup Time [id number]   Final    Culture     Final    Value:        BLOOD CULTURE RECEIVED NO GROWTH TO DATE CULTURE WILL BE HELD FOR 5 DAYS BEFORE ISSUING A FINAL NEGATIVE REPORT   Report Status PENDING   Incomplete   URINE CULTURE     Status: Normal   Collection Time   10/29/11  1:48 AM      Component Value Range Status Comment   Specimen Description URINE, CLEAN CATCH   Final    Special Requests NONE   Final    Setup Time [id number]   Final    Colony Count >=100,000 COLONIES/ML   Final    Culture KLEBSIELLA PNEUMONIAE   Final  Report Status 10/31/2011 FINAL   Final    Organism ID, Bacteria KLEBSIELLA PNEUMONIAE   Final   CULTURE, BLOOD (ROUTINE X 2)     Status: Normal (Preliminary result)   Collection Time   10/30/11 11:30 PM      Component Value Range Status Comment   Specimen Description BLOOD RIGHT ANTECUBITAL   Final    Special Requests BOTTLES DRAWN AEROBIC AND ANAEROBIC 4CC   Final    Setup Time [id number]   Final    Culture     Final    Value:        BLOOD CULTURE RECEIVED NO GROWTH TO DATE CULTURE WILL BE HELD FOR 5 DAYS BEFORE ISSUING A FINAL NEGATIVE REPORT   Report Status PENDING   Incomplete   CULTURE, BLOOD (ROUTINE X 2)     Status: Normal (Preliminary result)   Collection Time   10/30/11 11:33 PM      Component Value Range Status Comment   Specimen  Description BLOOD RIGHT HAND   Final    Special Requests BOTTLES DRAWN AEROBIC AND ANAEROBIC 3CC   Final    Setup Time [id number]   Final    Culture     Final    Value:        BLOOD CULTURE RECEIVED NO GROWTH TO DATE CULTURE WILL BE HELD FOR 5 DAYS BEFORE ISSUING A FINAL NEGATIVE REPORT   Report Status PENDING   Incomplete     Studies/Results: Dg Chest 2 View  11/02/2011  *RADIOLOGY REPORT*  Clinical Data: Follow up pneumonia  CHEST - 2 VIEW  Comparison: 10/29/2011  Findings: Cardiomegaly with mild interstitial edema and small bilateral pleural effusions, left greater than right.  Bilateral airspace opacities are improving, likely reflecting improving edema.  Focal opacity overlying the left mid lung, suspected to reflect loculated pleural fluid.  Bibasilar atelectasis.  Sternotomy wires.  Stable right chest power port.  IMPRESSION: Cardiomegaly with mild interstitial edema and small bilateral pleural effusions, greater than right.  Focal left mid lung opacity, likely reflecting loculated pleural fluid.  Bilateral airspace opacities are improved, favored to reflect improving edema.  Original Report Authenticated By: Charline Bills, M.D.    Medications:     . albuterol  2.5 mg Nebulization QID  . aspirin  81 mg Oral Daily  . docusate sodium  100 mg Oral BID  . fluconazole (DIFLUCAN) IV  400 mg Intravenous Q24H  . folic acid  0.5 mg Oral Daily  . furosemide  60 mg Intravenous Once  . levofloxacin (LEVAQUIN) IV  750 mg Intravenous Q24H  . metoprolol  100 mg Oral BID  . mirtazapine  15 mg Oral QHS  . pantoprazole  40 mg Oral Daily  . polyethylene glycol  17 g Oral Daily  . senna-docusate  1 tablet Oral BID  . sucralfate  1 g Oral TID AC & HS  . vitamin C  1,000 mg Oral Daily  . vitamin E  400 Units Oral Daily  . DISCONTD: metoprolol  75 mg Oral BID    Assessment/Plan Principal Problem:  *PNA (pneumonia) Active Problems:  Atrial fibrillation  COPD  ANEMIA-IRON DEFICIENCY   Constipation  UTI (lower urinary tract infection)  Hypokalemia  Hyponatremia  Leukocytosis  1.PNA (pneumonia) - Pt afebrile x 72 hours. Cultures pending. Please note that lactic acid was within normal limits and her calcitonin was slightly above the normal range and not indicative of sepsis. Blood cultures continued to remain negative, and patient remains afebrile,  will narrow antibiotic coverage. D/C vanc and Zosyn and cipro. Place on IV levaquin. Repeat CXR with some improvement. Will change to oral levaquin.  2. Klebsiella Pneumoniae UTI (lower urinary tract infection) - sensitivities resistant to ampicillin. Change IV Cipro to levaquin to cover both PNA and UTI. Antibiotic D4/7 .  3.Colon Ca - per wife the patient was seen by oncology this morning and recommend resumption of chemotherapy as an outpatient in the next one to 2 weeks . Per Dr. Truett Perna chemotherapy will be resumed as outpatient.  4.Atrial fibrillation - Increase metoprolol to 100 mg twice a day for rate control, Aspirin.  5.COPD - appears to be stable at that time. We'll continue albuterol nebulizers as noted above.  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 hemoglobin and hematocrit remain stable and the patient's baseline.  7.Hypokalemia - will replete and check bmet in AM.  8.Hyponatremia - improved. 9.Leukocytosis - this is most likely secondary to combination of pneumonia and urinary tract infection. WBC is trending down. : Continue current antibiotic coverage.  10. HTN- Continue current regimen.  11.Constipation - Resolved. continue bowel regimen.       LOS: 5 days   [place] 11/03/2011, 11:12 AM

## 2011-11-04 LAB — BASIC METABOLIC PANEL
CO2: 28 mEq/L (ref 19–32)
Calcium: 9.3 mg/dL (ref 8.4–10.5)
Creatinine, Ser: 1.14 mg/dL (ref 0.50–1.35)
Glucose, Bld: 94 mg/dL (ref 70–99)

## 2011-11-04 LAB — CULTURE, BLOOD (ROUTINE X 2): Culture: NO GROWTH

## 2011-11-04 LAB — CBC
MCH: 31.4 pg (ref 26.0–34.0)
MCHC: 32.7 g/dL (ref 30.0–36.0)
MCV: 96.1 fL (ref 78.0–100.0)
Platelets: 241 10*3/uL (ref 150–400)
RDW: 17.6 % — ABNORMAL HIGH (ref 11.5–15.5)

## 2011-11-04 MED ORDER — FUROSEMIDE 10 MG/ML IJ SOLN
60.0000 mg | Freq: Once | INTRAMUSCULAR | Status: AC
  Administered 2011-11-04: 60 mg via INTRAVENOUS
  Filled 2011-11-04: qty 6

## 2011-11-04 MED ORDER — POTASSIUM CHLORIDE CRYS ER 20 MEQ PO TBCR
40.0000 meq | EXTENDED_RELEASE_TABLET | Freq: Once | ORAL | Status: AC
  Administered 2011-11-04: 40 meq via ORAL
  Filled 2011-11-04: qty 2

## 2011-11-04 NOTE — Progress Notes (Signed)
Subjective: Patient denies any complaints. Feeling better than yesterday.  Objective: Vital signs in last 24 hours: Filed Vitals:   11/04/11 0500 11/04/11 0853 11/04/11 1000 11/04/11 1225  BP: 169/81  143/84   Pulse: 100  100   Temp: 97.9 F (36.6 C)  97.7 F (36.5 C)   TempSrc: Oral  Oral   Resp: 20  18   Height:      Weight:      SpO2: 96% 94% 96% 97%    Intake/Output Summary (Last 24 hours) at 11/04/11 1505 Last data filed at 11/04/11 1407  Gross per 24 hour  Intake    440 ml  Output      0 ml  Net    440 ml    Weight change:   General: Alert, awake, oriented x3, in no acute distress Heart: Regular rate and rhythm, without murmurs, rubs, gallops. Lungs: Coarse BS bibasilarlly Abdomen: Soft, nontender, nondistended, positive bowel sounds. Extremities: No clubbing cyanosis or edema with positive pedal pulses. Neuro: Grossly intact, nonfocal.   Lab Results:  [facility] 11/04/11 0625 11/03/11 0528  NA 139 140  K 3.4* 3.9  CL 103 107  CO2 28 26  GLUCOSE 94 88  BUN 10 7  CREATININE 1.14 1.03  CALCIUM 9.3 9.3  MG -- --  PHOS -- --   No results found for this basename: AST:2,ALT:2,ALKPHOS:2,BILITOT:2,PROT:2,ALBUMIN:2 in the last 72 hours No results found for this basename: LIPASE:2,AMYLASE:2 in the last 72 hours  Basename 11/04/11 0625 11/03/11 0528  WBC 5.8 5.8  NEUTROABS -- --  HGB 9.6* 9.2*  HCT 29.4* 28.4*  MCV 96.1 96.6  PLT 241 206   No results found for this basename: CKTOTAL:3,CKMB:3,CKMBINDEX:3,TROPONINI:3 in the last 72 hours No results found for this basename: POCBNP:3 in the last 72 hours No results found for this basename: DDIMER:2 in the last 72 hours No results found for this basename: HGBA1C:2 in the last 72 hours No results found for this basename: CHOL:2,HDL:2,LDLCALC:2,TRIG:2,CHOLHDL:2,LDLDIRECT:2 in the last 72 hours No results found for this basename: TSH,T4TOTAL,FREET3,T3FREE,THYROIDAB in the last 72 hours No results found for this  basename: VITAMINB12:2,FOLATE:2,FERRITIN:2,TIBC:2,IRON:2,RETICCTPCT:2 in the last 72 hours  Micro Results: Recent Results (from the past 240 hour(s))  CULTURE, BLOOD (ROUTINE X 2)     Status: Normal   Collection Time   10/29/11  1:26 AM      Component Value Range Status Comment   Specimen Description BLOOD RIGHT ANTECUBITAL   Final    Special Requests BOTTLES DRAWN AEROBIC AND ANAEROBIC 4CC   Final    Setup Time [id number]   Final    Culture NO GROWTH 5 DAYS   Final    Report Status 11/04/2011 FINAL   Final   URINE CULTURE     Status: Normal   Collection Time   10/29/11  1:48 AM      Component Value Range Status Comment   Specimen Description URINE, CLEAN CATCH   Final    Special Requests NONE   Final    Setup Time [id number]   Final    Colony Count >=100,000 COLONIES/ML   Final    Culture KLEBSIELLA PNEUMONIAE   Final    Report Status 10/31/2011 FINAL   Final    Organism ID, Bacteria KLEBSIELLA PNEUMONIAE   Final   CULTURE, BLOOD (ROUTINE X 2)     Status: Normal (Preliminary result)   Collection Time   10/30/11 11:30 PM      Component Value Range Status Comment  Specimen Description BLOOD RIGHT ANTECUBITAL   Final    Special Requests BOTTLES DRAWN AEROBIC AND ANAEROBIC 4CC   Final    Setup Time [id number]   Final    Culture     Final    Value:        BLOOD CULTURE RECEIVED NO GROWTH TO DATE CULTURE WILL BE HELD FOR 5 DAYS BEFORE ISSUING A FINAL NEGATIVE REPORT   Report Status PENDING   Incomplete   CULTURE, BLOOD (ROUTINE X 2)     Status: Normal (Preliminary result)   Collection Time   10/30/11 11:33 PM      Component Value Range Status Comment   Specimen Description BLOOD RIGHT HAND   Final    Special Requests BOTTLES DRAWN AEROBIC AND ANAEROBIC 3CC   Final    Setup Time [id number]   Final    Culture     Final    Value:        BLOOD CULTURE RECEIVED NO GROWTH TO DATE CULTURE WILL BE HELD FOR 5 DAYS BEFORE ISSUING A FINAL NEGATIVE REPORT   Report Status PENDING    Incomplete     Studies/Results: No results found.  Medications:     . albuterol  2.5 mg Nebulization QID  . aspirin  81 mg Oral Daily  . docusate sodium  100 mg Oral BID  . fluconazole (DIFLUCAN) IV  400 mg Intravenous Q24H  . folic acid  0.5 mg Oral Daily  . furosemide  60 mg Intravenous Q12H  . furosemide  60 mg Intravenous Once  . levofloxacin  750 mg Oral Daily  . metoprolol  100 mg Oral BID  . mirtazapine  15 mg Oral QHS  . pantoprazole  40 mg Oral Daily  . polyethylene glycol  17 g Oral Daily  . potassium chloride  40 mEq Oral Once  . senna-docusate  1 tablet Oral BID  . sucralfate  1 g Oral TID AC & HS  . vitamin C  1,000 mg Oral Daily  . vitamin E  400 Units Oral Daily    Assessment/Plan Principal Problem:  *PNA (pneumonia) Active Problems:  Atrial fibrillation  COPD  ANEMIA-IRON DEFICIENCY  Constipation  UTI (lower urinary tract infection)  Hypokalemia  Hyponatremia  Leukocytosis  1.PNA (pneumonia) - Pt afebrile x 4 days.. Cultures pending. Please note that lactic acid was within normal limits and her calcitonin was slightly above the normal range and not indicative of sepsis. Blood cultures continued to remain negative, and patient remains afebrile, will narrow antibiotic coverage. Coninue  oral levaquin.  2. Klebsiella Pneumoniae UTI (lower urinary tract infection) - Levaquin  Antibiotic D5/7 .  3.Colon Ca - per wife the patient was seen by oncology this morning and recommend resumption of chemotherapy as an outpatient in the next one to 2 weeks . Per Dr. Truett Perna chemotherapy will be resumed as outpatient.  4.Atrial fibrillation - Increase metoprolol to 100 mg twice a day for rate control, Aspirin.  5.COPD - appears to be stable at that time. We'll continue albuterol nebulizers as noted above.  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 hemoglobin and hematocrit remain stable and the patient's baseline.  7.Hypokalemia - will  replete and check bmet in AM.  8.Hyponatremia - improved. 9.Leukocytosis - this is most likely secondary to combination of pneumonia and urinary tract infection. WBC is trending down. : Continue current antibiotic coverage.  10. HTN- Continue current regimen.  11.Constipation - Resolved. continue bowel regimen.  LOS: 6 days   THOMPSON,DANIEL 11/04/2011, 3:05 PM

## 2011-11-05 ENCOUNTER — Telehealth: Payer: Self-pay | Admitting: *Deleted

## 2011-11-05 ENCOUNTER — Other Ambulatory Visit: Payer: Self-pay | Admitting: *Deleted

## 2011-11-05 LAB — CBC
MCV: 96.5 fL (ref 78.0–100.0)
Platelets: 307 10*3/uL (ref 150–400)
RDW: 17.6 % — ABNORMAL HIGH (ref 11.5–15.5)
WBC: 6.3 10*3/uL (ref 4.0–10.5)

## 2011-11-05 LAB — BASIC METABOLIC PANEL
Calcium: 9.6 mg/dL (ref 8.4–10.5)
Creatinine, Ser: 1.24 mg/dL (ref 0.50–1.35)
GFR calc Af Amer: 65 mL/min — ABNORMAL LOW (ref >90)
GFR calc non Af Amer: 56 mL/min — ABNORMAL LOW (ref >90)

## 2011-11-05 MED ORDER — POTASSIUM CHLORIDE CRYS ER 20 MEQ PO TBCR
40.0000 meq | EXTENDED_RELEASE_TABLET | Freq: Once | ORAL | Status: AC
  Administered 2011-11-05: 40 meq via ORAL
  Filled 2011-11-05: qty 2

## 2011-11-05 MED ORDER — LEVOFLOXACIN 750 MG PO TABS: 750.0000 mg | ORAL_TABLET | Freq: Every day | ORAL | Status: DC

## 2011-11-05 MED ORDER — BENZONATATE 100 MG PO CAPS: 100.0000 mg | ORAL_CAPSULE | Freq: Three times a day (TID) | ORAL | Status: AC | PRN

## 2011-11-05 MED ORDER — METOPROLOL TARTRATE 100 MG PO TABS: 100.0000 mg | ORAL_TABLET | Freq: Two times a day (BID) | ORAL | Status: DC

## 2011-11-05 NOTE — Progress Notes (Signed)
Patient discharged home with wife, alert and oriented, discharge orders given, patient verbalize understanding of discharge instruction, patient in stable condition at this time

## 2011-11-05 NOTE — Discharge Summary (Addendum)
Discharge Summary  Kyle Love MR#: [id number]  DOB:09-29-1941  Date of Admission: 10/29/2011 Date of Discharge: 11/05/2011  Patient's PCP: Kyle Ribas, MD  Attending Physician:THOMPSON,DANIEL  Consults: Dr Truett Perna  Discharge Diagnoses: PNA (pneumonia) Present on Admission:  .PNA (pneumonia) .UTI (lower urinary tract infection) .Constipation .COPD .ANEMIA-IRON DEFICIENCY .Atrial fibrillation .Hyponatremia .Leukocytosis   Brief Admitting History and Physical  Kyle Love is an 70 y.o. malewith Stage IV Colon Cancer diagnosed in June 2012 currently receiving Chemo treatments with fevers that started 1 day ago, with temperature to 102. He reports having cough and denies any dysuria. For the rest of admission H and P please see note per Dr Della Goo.   Discharge Medications Current Discharge Medication List    START taking these medications   Details  levofloxacin (LEVAQUIN) 750 MG tablet Take 1 tablet (750 mg total) by mouth daily. Qty: 2 tablet, Refills: 0    Tessalon Perles 100mg ; Take 1 tablet 3 times daily x 4 days then as needed for cough.    CONTINUE these medications which have CHANGED   Details  metoprolol (LOPRESSOR) 100 MG tablet Take 1 tablet (100 mg total) by mouth 2 (two) times daily. Qty: 62 tablet, Refills: 0      CONTINUE these medications which have NOT CHANGED   Details  ALPRAZolam (XANAX) 0.5 MG tablet Take 0.5 mg by mouth at bedtime as needed.      Ascorbic Acid (VITAMIN C) 1000 MG tablet Take 1,000 mg by mouth daily.      aspirin 81 MG tablet Take 81 mg by mouth daily.      CARAFATE 1 GM/10ML suspension Take 1 g by mouth 4 (four) times daily -  before meals and at bedtime.     Cyanocobalamin (VITAMIN B 12 PO) Take by mouth.      docusate sodium (COLACE) 100 MG capsule Take 100 mg by mouth 2 (two) times daily.      folic acid (FOLVITE) 400 MCG tablet Take 400 mcg by mouth daily.      furosemide (LASIX) 20 MG  tablet Take 1 tablet (20 mg total) by mouth daily. Qty: 30 tablet, Refills: 12    HYDROcodone-acetaminophen (NORCO) 10-325 MG per tablet Norco 5/325 #30 w/ no refills Qty: 30 tablet, Refills: 0   Associated Diagnoses: Pain    lidocaine-prilocaine (EMLA) cream Ad lib.    mirtazapine (REMERON) 15 MG tablet Take 15 mg by mouth at bedtime.     pantoprazole (PROTONIX) 40 MG tablet Take 40 mg by mouth daily.     polyethylene glycol (MIRALAX / GLYCOLAX) packet Take 17 g by mouth daily.      potassium chloride (KLOR-CON) 10 MEQ CR tablet Take 10 mEq by mouth daily.      prochlorperazine (COMPAZINE) 10 MG tablet Take 10 mg by mouth every 6 (six) hours as needed. Nausea     vitamin E 400 UNIT capsule Take 400 Units by mouth daily.          Hospital Course: 1.PNA (pneumonia)  Patient had presented to hospital with 1 day hx of fevers, cough and leukocytosis with CXR findings concerning for PNA. Patient was placed on empiric antibiotics of IV rocephin and azithromycin. On hospital D 2 patient started spiking fevers and was pancultured and antibiotic coverage was broadened to Vancomycin and Zosyn. Iv Diflucan was also added to his regimen. Blood cultures obtained were negative to date. Urine cx consitent with klebsiella pneumonia. IV levaquin was subsequently added to his  regimen. Patient deferversed and improved clinically. Patient did experience some fatigue and increased oxygen requirements which were secondary to IVF. His IVF were d/c'd, repeat CXR with some improvement. Patient diuresed with IV lasix with significant improvement. Patient's antibiotic coverage was narrowed to levaquin and changed to oral medications. Patients leukocytosis trended down and normalized by day of discharge. Patient will be discharged home on 2 more days of levaquin to complete 1 week course of antibiotics. Patient will follow up with PCP as stated. Patient will be discharged in stable and improved condition.  2.  Klebsiella Pneumoniae UTI - Urinalysis obtained on admission was consistent with UTI. Patient on admission was initially placed on rocephin and later antibiotic coverage broadened to zosyn. However urine cultures came back positive for klebsiella pneumoniae which was resistant to ampicillin. Levaquin was added to his regimen and patient will be d/c'd on 2 more days of levaquin to complete a 1 week course of antibiotic regimen. 3. Atrial Fibrillation- Patients resting heart rate during hospitalization was 103 during hospitalization and as such his lopressor dose was increased for better rate control and SBP. Patient tolerated the increased dose and will follow up with his cardiologist as scheduled. 4. Hyponatremia - Patient noted to be hyponatremic during hospitalization which was initially felt to be secondary to volume depletion. Patient was hydrated gently. Patient had some improvement, however hyponatremia seemed to resolve once patient was diuresed, and had resolved by day of discharge.  5. Metastatic Colon Cancer- Remained stable. Patient was seen by his oncologist Dr Truett Perna during his hospitalization and during this time chemotherapy was held. Patient will follow up as outpatient for resumption of his chemotherapy per his oncologist. The rest of patients chronic medical issues remained stable during the hospitalization. Patient will be discharged in stable and improved condition. Present on Admission:  .PNA (pneumonia) .UTI (lower urinary tract infection) .Constipation .COPD .ANEMIA-IRON DEFICIENCY .Atrial fibrillation .Hyponatremia .Leukocytosis   Day of Discharge BP 130/58  Pulse 101  Temp(Src) 98.1 F (36.7 C) (Oral)  Resp 18  Ht 6\' 1"  (1.854 m)  Wt 87.2 kg (192 lb 3.9 oz)  BMI 25.36 kg/m2  SpO2 95% General: Alert, awake, oriented x3, in no acute distress. HEENT: No bruits, no goiter. Heart: Regular rate and rhythm, without murmurs, rubs, gallops. Lungs: Coarse basilar  BS Abdomen: Soft, nontender, nondistended, positive bowel sounds. Extremities: No clubbing cyanosis or edema with positive pedal pulses. Neuro: Grossly intact, nonfocal.   Results for orders placed during the hospital encounter of 10/29/11 (from the past 48 hour(s))  BASIC METABOLIC PANEL     Status: Abnormal   Collection Time   11/04/11  6:25 AM      Component Value Range Comment   Sodium 139  135 - 145 (mEq/L)    Potassium 3.4 (*) 3.5 - 5.1 (mEq/L)    Chloride 103  96 - 112 (mEq/L)    CO2 28  19 - 32 (mEq/L)    Glucose, Bld 94  70 - 99 (mg/dL)    BUN 10  6 - 23 (mg/dL)    Creatinine, Ser 1.61  0.50 - 1.35 (mg/dL)    Calcium 9.3  8.4 - 10.5 (mg/dL)    GFR calc non Af Amer 62 (*) >90 (mL/min)    GFR calc Af Amer 72 (*) >90 (mL/min)   CBC     Status: Abnormal   Collection Time   11/04/11  6:25 AM      Component Value Range Comment   WBC 5.8  4.0 - 10.5 (K/uL)    RBC 3.06 (*) 4.22 - 5.81 (MIL/uL)    Hemoglobin 9.6 (*) 13.0 - 17.0 (g/dL)    HCT 16.1 (*) 09.6 - 52.0 (%)    MCV 96.1  78.0 - 100.0 (fL)    MCH 31.4  26.0 - 34.0 (pg)    MCHC 32.7  30.0 - 36.0 (g/dL)    RDW 04.5 (*) 40.9 - 15.5 (%)    Platelets 241  150 - 400 (K/uL)   BASIC METABOLIC PANEL     Status: Abnormal   Collection Time   11/05/11  5:35 AM      Component Value Range Comment   Sodium 139  135 - 145 (mEq/L)    Potassium 3.4 (*) 3.5 - 5.1 (mEq/L)    Chloride 100  96 - 112 (mEq/L)    CO2 30  19 - 32 (mEq/L)    Glucose, Bld 103 (*) 70 - 99 (mg/dL)    BUN 14  6 - 23 (mg/dL)    Creatinine, Ser 8.11  0.50 - 1.35 (mg/dL)    Calcium 9.6  8.4 - 10.5 (mg/dL)    GFR calc non Af Amer 56 (*) >90 (mL/min)    GFR calc Af Amer 65 (*) >90 (mL/min)   CBC     Status: Abnormal   Collection Time   11/05/11  5:35 AM      Component Value Range Comment   WBC 6.3  4.0 - 10.5 (K/uL)    RBC 3.12 (*) 4.22 - 5.81 (MIL/uL)    Hemoglobin 9.6 (*) 13.0 - 17.0 (g/dL)    HCT 91.4 (*) 78.2 - 52.0 (%)    MCV 96.5  78.0 - 100.0 (fL)     MCH 30.8  26.0 - 34.0 (pg)    MCHC 31.9  30.0 - 36.0 (g/dL)    RDW 95.6 (*) 21.3 - 15.5 (%)    Platelets 307  150 - 400 (K/uL)     Dg Chest 2 View  11/02/2011  *RADIOLOGY REPORT*  Clinical Data: Follow up pneumonia  CHEST - 2 VIEW  Comparison: 10/29/2011  Findings: Cardiomegaly with mild interstitial edema and small bilateral pleural effusions, left greater than right.  Bilateral airspace opacities are improving, likely reflecting improving edema.  Focal opacity overlying the left mid lung, suspected to reflect loculated pleural fluid.  Bibasilar atelectasis.  Sternotomy wires.  Stable right chest power port.  IMPRESSION: Cardiomegaly with mild interstitial edema and small bilateral pleural effusions, greater than right.  Focal left mid lung opacity, likely reflecting loculated pleural fluid.  Bilateral airspace opacities are improved, favored to reflect improving edema.  Original Report Authenticated By: Charline Bills, M.D.   Ct Abdomen Pelvis W Contrast  10/24/2011  *RADIOLOGY REPORT*  Clinical Data: Metastatic colon cancer  CT ABDOMEN AND PELVIS WITH CONTRAST  Technique:  Multidetector CT imaging of the abdomen and pelvis was performed following the standard protocol during bolus administration of intravenous contrast.  Contrast: OMNIPAQUE IOHEXOL 300 MG/ML IV SOLN  Comparison: PET CT dated 07/11/2011  Findings: Moderate right pleural effusion, new.  Trace left pleural fluid/thickening with associated scarring/atelectasis.  The liver is within normal limits. Specifically, no definite hypoenhancing lesion is seen to correspond to the prior lesions on PET-CT.  The spleen, pancreas, and adrenal glands are within normal limits.  Status post cholecystectomy.  No intrahepatic or extrahepatic ductal dilatation.  Bilateral renal cysts.  Multiple 2-3 mm bilateral nonobstructing renal calculi.  No hydronephrosis.  Status post  right hemicolectomy.  No evidence of bowel obstruction.  Atherosclerotic  calcifications of the abdominal aorta and branch vessels.  No abdominopelvic ascites.  Small abdominopelvic lymph nodes, including: --3 mm short-axis right retrocrural node (series 2/image 9), previously 6 mm --8 mm short axis peripancreatic node (series 2/image 21), previously 14 mm --8 mm short axis left periaortic node ( two/image 31), previously 14 mm --7 mm left common iliac node (series 2/image 52), previously 6 mm  Very mild peritoneal nodularity, for example in the left anterior abdomen (series 2/image 29) and along the right lower pole (series 2/image 37), improved.  Prostate is mildly heterogeneous.  Bladder is thick-walled but underdistended.  Degenerative changes of the visualized thoracolumbar spine.  No focal osseous lesions.  IMPRESSION: Status post right hemicolectomy.  Prior liver metastases are not well demonstrated on CT, likely improved.  Small abdominopelvic lymph nodes, as described above, improved.  Tiny peritoneal implants, improved.  Moderate right pleural effusion, new.  Original Report Authenticated By: Charline Bills, M.D.   Dg Chest Portable 1 View  10/29/2011  *RADIOLOGY REPORT*  Clinical Data: Chest pain  PORTABLE CHEST - 1 VIEW  Comparison: 09/14/2011  Findings: Right internal jugular vein Port-A-Cath is stable. Patchy densities have developed throughout the right lung.  Left perihilar patchy opacities have also developed.  Mild cardiomegaly. No pneumothorax.  There is blunting of the right costophrenic angle and a right pleural effusion may also be present.  IMPRESSION: Bilateral patchy airspace disease has developed.  Right pleural effusion is suspected.  Original Report Authenticated By: Donavan Burnet, M.D.     Disposition: Home  Diet: cardiac prudent  Activity: increase slowly   Follow-up Appts: Discharge Orders    Future Appointments: Provider: Department: Dept Phone: Center:   11/06/2011 9:30 AM Sherriann Nelma [facility]  None   11/16/2011  10:45 AM Wendall Stade, MD [facility] [phone] [facility]   11/21/2011 8:45 AM Jarrett Soho [facility] [phone] None   11/21/2011 9:15 AM Lonna Cobb [facility] [phone] None   11/21/2011 10:00 AM [facility] [phone] None   11/23/2011 3:30 PM [facility] Flush Nurse [facility] [phone] None     Future Orders Please Complete By Expires   Diet - low sodium heart healthy      Increase activity slowly      Discharge instructions      Comments:   Follow up with Kyle Ribas, MD in 1 week. Follow up with Dr Truett Perna Follow up with Dr [name] as scheduled.       TESTS THAT NEED FOLLOW-UP Patient will need a BMET, CBC on followup and repeat CXR in 4-6 weeks.  Time spent on discharge, talking to the patient, and coordinating care: .   SignedRamiro Harvest 11/05/2011, 11:40 AM

## 2011-11-05 NOTE — Telephone Encounter (Signed)
Discharged from hospital today. Still has 2 days of antibiotics to take at home. Needs to schedule next round of chemo. Currently on schedule form 11/21/11. Requests appointment for early am (around 0900). Note to MD.

## 2011-11-06 ENCOUNTER — Telehealth: Payer: Self-pay | Admitting: *Deleted

## 2011-11-06 ENCOUNTER — Ambulatory Visit (HOSPITAL_COMMUNITY): Payer: Medicare Other | Attending: Cardiology

## 2011-11-06 DIAGNOSIS — R0989 Other specified symptoms and signs involving the circulatory and respiratory systems: Secondary | ICD-10-CM | POA: Insufficient documentation

## 2011-11-06 DIAGNOSIS — Z954 Presence of other heart-valve replacement: Secondary | ICD-10-CM | POA: Insufficient documentation

## 2011-11-06 DIAGNOSIS — R0602 Shortness of breath: Secondary | ICD-10-CM

## 2011-11-06 DIAGNOSIS — I079 Rheumatic tricuspid valve disease, unspecified: Secondary | ICD-10-CM | POA: Insufficient documentation

## 2011-11-06 DIAGNOSIS — I059 Rheumatic mitral valve disease, unspecified: Secondary | ICD-10-CM | POA: Insufficient documentation

## 2011-11-06 DIAGNOSIS — I379 Nonrheumatic pulmonary valve disorder, unspecified: Secondary | ICD-10-CM | POA: Insufficient documentation

## 2011-11-06 DIAGNOSIS — R0609 Other forms of dyspnea: Secondary | ICD-10-CM | POA: Insufficient documentation

## 2011-11-06 LAB — CULTURE, BLOOD (ROUTINE X 2): Culture  Setup Time: 201211070408

## 2011-11-06 NOTE — Telephone Encounter (Signed)
Called patient after daughter had called office twice asking for his next chemo appointment and gave him appointment for 11/12/11. Patient does not want treatment on Thanksgiving week. Wishes to wait until 11/21/11 and is insistent on his appointments being on Wednesdays to accommodate his work schedule. Family failed to tell us he did not want to be treated on Thanksgiving week when they called. Will reschedule him back to midlevel schedule for 11/28 and FOLFOX. Scheduler notified for change.

## 2011-11-07 ENCOUNTER — Telehealth: Payer: Self-pay | Admitting: *Deleted

## 2011-11-08 ENCOUNTER — Other Ambulatory Visit: Payer: Self-pay | Admitting: *Deleted

## 2011-11-08 DIAGNOSIS — R52 Pain, unspecified: Secondary | ICD-10-CM

## 2011-11-08 MED ORDER — HYDROCODONE-ACETAMINOPHEN 10-325 MG PO TABS: ORAL_TABLET | ORAL | Status: DC

## 2011-11-08 MED ORDER — FLUCONAZOLE 100 MG PO TABS: 100.0000 mg | ORAL_TABLET | Freq: Every day | ORAL | Status: AC

## 2011-11-08 NOTE — Telephone Encounter (Addendum)
Call from pt's wife reporting pt having bad taste in his mouth, interfering with eating. Spoke with pt, he denies oral pain. Pt's wife reports tongue is almost entirely coated white. Requesting new rx for "yeast". Pt also needs refill on pain medication.  Reviewed with Dr. Truett Perna, order received to call in refill for Hydrocodone and Diflucan. Same done, pt's wife made aware.

## 2011-11-09 ENCOUNTER — Telehealth: Payer: Self-pay | Admitting: *Deleted

## 2011-11-09 NOTE — Telephone Encounter (Signed)
Call from pt's daughter and wife reporting pt is having difficulty urinating. Returned call, Mrs. Mccarry reports pt's urine stream "sounds weak". Pt denies pain, dysuria, urinary urgency or frequency. Instructed wife to have pt call primary care provider if he notices pain or bleeding with urination or if pt has fever. She verbalized understanding.

## 2011-11-12 ENCOUNTER — Other Ambulatory Visit: Payer: Medicare Other | Admitting: Lab

## 2011-11-12 ENCOUNTER — Ambulatory Visit: Payer: Medicare Other | Admitting: Oncology

## 2011-11-12 ENCOUNTER — Inpatient Hospital Stay: Payer: Medicare Other

## 2011-11-16 ENCOUNTER — Encounter: Payer: Self-pay | Admitting: Cardiovascular Disease

## 2011-11-16 ENCOUNTER — Ambulatory Visit (INDEPENDENT_AMBULATORY_CARE_PROVIDER_SITE_OTHER): Payer: Medicare Other | Admitting: Cardiovascular Disease

## 2011-11-16 DIAGNOSIS — I25810 Atherosclerosis of coronary artery bypass graft(s) without angina pectoris: Secondary | ICD-10-CM

## 2011-11-16 DIAGNOSIS — I4891 Unspecified atrial fibrillation: Secondary | ICD-10-CM

## 2011-11-16 DIAGNOSIS — C189 Malignant neoplasm of colon, unspecified: Secondary | ICD-10-CM

## 2011-11-16 DIAGNOSIS — Z954 Presence of other heart-valve replacement: Secondary | ICD-10-CM

## 2011-11-16 NOTE — Patient Instructions (Addendum)
Your physician recommends that you schedule a follow-up appointment in: January 2013 WITH DR [facility] Your physician recommends that you continue on your current medications as directed. Please refer to the Current Medication list given to you today.

## 2011-11-16 NOTE — Assessment & Plan Note (Signed)
Good rate control and no coumadin due to stage 4 colon CA and chemo

## 2011-11-16 NOTE — Assessment & Plan Note (Signed)
Stable with no angina and good activity level.  Continue medical Rx  

## 2011-11-16 NOTE — Assessment & Plan Note (Signed)
Normal exam.  Echo done recently shows normal functioning

## 2011-11-16 NOTE — Progress Notes (Signed)
70 yo with chronic afib and tissue AVR 2008. Undergoing Rx for metastatic stage 4 colon CA. Has had lots of issues with anema and bleeding. After debulking surgery elected to not resume coumadin. Since hospital D/C weight down 26 lbs. Has port a cath and is getting chemoRx. 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 He indicates post op PET scan more encouraging. Has good days and bad days. Appetite poor. Wife just had knee surgery with Alusio. Discussed issues of regulating coumadin during chemo and weight loss and I still prefer to keep him off it. No SSCP, TIA;s, dyspnea or edema More dyspnea. No cough. No SSCP. No palpitations PND or orthopnea  Reviewed Labs:  Hct steadily going down last 8 weeks from 36.5 to 28.4  Echo 1/12 EF 55-65% Mean gradient 6 no AR AVR normal mild to moderate MR  Recent hospitalization earlier this month for UTI and pneumonia.  Will get next round of chemo next week  ROS: Denies fever, malais, weight loss, blurry vision, decreased visual acuity, cough, sputum, SOB, hemoptysis, pleuritic pain, palpitaitons, heartburn, abdominal pain, melena, lower extremity edema, claudication, or rash.  All other systems reviewed and negative  General: Affect appropriate Healthy:  appears stated age HEENT: normal Neck supple with no adenopathy JVP normal no bruits no thyromegaly Lungs clear with no wheezing and good diaphragmatic motion Heart:  S1/S2 SEM  murmur,rub, gallop or click PMI normal Abdomen: benighn, BS positve, no tenderness, no AAA no bruit.  No HSM or HJR Distal pulses intact with no bruits No edema Neuro non-focal Skin warm and dry No muscular weakness Right subclavian portacath   Current Outpatient Prescriptions  Medication Sig Dispense Refill  . ALPRAZolam (XANAX) 0.5 MG tablet Take 0.5 mg by mouth at bedtime as needed.        . Ascorbic Acid (VITAMIN C) 1000 MG tablet Take 1,000 mg by mouth daily.        Marland Kitchen aspirin 81 MG tablet Take 81 mg by mouth  daily.        Marland Kitchen CARAFATE 1 GM/10ML suspension Take 1 g by mouth 4 (four) times daily -  before meals and at bedtime.       . Cyanocobalamin (VITAMIN B 12 PO) Take by mouth.        . docusate sodium (COLACE) 100 MG capsule Take 100 mg by mouth 2 (two) times daily.        . folic acid (FOLVITE) 400 MCG tablet Take 400 mcg by mouth daily.        . furosemide (LASIX) 20 MG tablet Take 1 tablet (20 mg total) by mouth daily.  30 tablet  12  . HYDROcodone-acetaminophen (NORCO) 10-325 MG per tablet Norco 5/325 #30 w/ no refills  50 tablet  0  . lidocaine-prilocaine (EMLA) cream Ad lib.      . metoprolol (LOPRESSOR) 100 MG tablet Take 1 tablet (100 mg total) by mouth 2 (two) times daily.  62 tablet  0  . pantoprazole (PROTONIX) 40 MG tablet Take 40 mg by mouth daily.       . polyethylene glycol (MIRALAX / GLYCOLAX) packet Take 17 g by mouth daily.        . prochlorperazine (COMPAZINE) 10 MG tablet Take 10 mg by mouth every 6 (six) hours as needed. Nausea       . vitamin E 400 UNIT capsule Take 400 Units by mouth daily.        . fluconazole (DIFLUCAN) 100 MG tablet Take 1  tablet (100 mg total) by mouth daily.  5 tablet  0    Allergies  Diltiazem hcl; Penicillins; and Plasma human  Electrocardiogram:  Assessment and Plan

## 2011-11-16 NOTE — Assessment & Plan Note (Signed)
F/U Dr Tora Duck.  Chemo next week.  Prognosis poor

## 2011-11-20 ENCOUNTER — Other Ambulatory Visit: Payer: Self-pay | Admitting: *Deleted

## 2011-11-20 ENCOUNTER — Other Ambulatory Visit: Payer: Self-pay | Admitting: Oncology

## 2011-11-20 DIAGNOSIS — C182 Malignant neoplasm of ascending colon: Secondary | ICD-10-CM

## 2011-11-21 ENCOUNTER — Other Ambulatory Visit: Payer: Medicare Other

## 2011-11-21 ENCOUNTER — Ambulatory Visit: Payer: Medicare Other | Admitting: Nurse Practitioner

## 2011-11-21 ENCOUNTER — Inpatient Hospital Stay: Payer: Medicare Other

## 2011-11-21 ENCOUNTER — Ambulatory Visit (HOSPITAL_BASED_OUTPATIENT_CLINIC_OR_DEPARTMENT_OTHER): Payer: Medicare Other | Admitting: Nurse Practitioner

## 2011-11-21 ENCOUNTER — Other Ambulatory Visit (HOSPITAL_BASED_OUTPATIENT_CLINIC_OR_DEPARTMENT_OTHER): Payer: Medicare Other

## 2011-11-21 ENCOUNTER — Ambulatory Visit (HOSPITAL_BASED_OUTPATIENT_CLINIC_OR_DEPARTMENT_OTHER): Payer: Medicare Other

## 2011-11-21 ENCOUNTER — Other Ambulatory Visit: Payer: Self-pay | Admitting: *Deleted

## 2011-11-21 ENCOUNTER — Telehealth: Payer: Self-pay | Admitting: Oncology

## 2011-11-21 ENCOUNTER — Ambulatory Visit (HOSPITAL_COMMUNITY)
Admission: RE | Admit: 2011-11-21 | Discharge: 2011-11-21 | Disposition: A | Discharge status: Home / Self Care | Payer: Medicare Other | Source: Ambulatory Visit | Attending: Nurse Practitioner | Admitting: Nurse Practitioner

## 2011-11-21 VITALS — BP 129/68 | HR 70 | Temp 97.0°F | Ht 72.0 in | Wt 189.1 lb

## 2011-11-21 DIAGNOSIS — R609 Edema, unspecified: Secondary | ICD-10-CM | POA: Insufficient documentation

## 2011-11-21 DIAGNOSIS — C189 Malignant neoplasm of colon, unspecified: Secondary | ICD-10-CM

## 2011-11-21 DIAGNOSIS — I517 Cardiomegaly: Secondary | ICD-10-CM | POA: Insufficient documentation

## 2011-11-21 DIAGNOSIS — C770 Secondary and unspecified malignant neoplasm of lymph nodes of head, face and neck: Secondary | ICD-10-CM

## 2011-11-21 DIAGNOSIS — F329 Major depressive disorder, single episode, unspecified: Secondary | ICD-10-CM

## 2011-11-21 DIAGNOSIS — J90 Pleural effusion, not elsewhere classified: Secondary | ICD-10-CM

## 2011-11-21 DIAGNOSIS — C787 Secondary malignant neoplasm of liver and intrahepatic bile duct: Secondary | ICD-10-CM

## 2011-11-21 DIAGNOSIS — C182 Malignant neoplasm of ascending colon: Secondary | ICD-10-CM

## 2011-11-21 DIAGNOSIS — Z23 Encounter for immunization: Secondary | ICD-10-CM

## 2011-11-21 LAB — COMPREHENSIVE METABOLIC PANEL
Alkaline Phosphatase: 215 U/L — ABNORMAL HIGH (ref 39–117)
CO2: 31 mEq/L (ref 19–32)
Creatinine, Ser: 1.05 mg/dL (ref 0.50–1.35)
Glucose, Bld: 158 mg/dL — ABNORMAL HIGH (ref 70–99)
Sodium: 137 mEq/L (ref 135–145)
Total Bilirubin: 0.2 mg/dL — ABNORMAL LOW (ref 0.3–1.2)

## 2011-11-21 LAB — CBC WITH DIFFERENTIAL/PLATELET
BASO%: 0.3 % (ref 0.0–2.0)
Basophils Absolute: 0 10*3/uL (ref 0.0–0.1)
EOS%: 4.8 % (ref 0.0–7.0)
HGB: 11.4 g/dL — ABNORMAL LOW (ref 13.0–17.1)
MCH: 31.7 pg (ref 27.2–33.4)
NEUT#: 3.5 10*3/uL (ref 1.5–6.5)
RDW: 17.6 % — ABNORMAL HIGH (ref 11.0–14.6)
lymph#: 0.7 10*3/uL — ABNORMAL LOW (ref 0.9–3.3)

## 2011-11-21 MED ORDER — LEUCOVORIN CALCIUM INJECTION 350 MG
390.0000 mg/m2 | Freq: Once | INTRAVENOUS | Status: DC
  Filled 2011-11-21: qty 41

## 2011-11-21 MED ORDER — INFLUENZA VIRUS VACC SPLIT PF IM SUSP
0.5000 mL | INTRAMUSCULAR | Status: AC
  Administered 2011-11-21: 0.5 mL via INTRAMUSCULAR
  Filled 2011-11-21: qty 0.5

## 2011-11-21 MED ORDER — ONDANSETRON 8 MG/50ML IVPB (CHCC): 8.0000 mg | Freq: Once | INTRAVENOUS | Status: DC

## 2011-11-21 MED ORDER — DEXAMETHASONE SODIUM PHOSPHATE 10 MG/ML IJ SOLN: 10.0000 mg | Freq: Once | INTRAMUSCULAR | Status: DC

## 2011-11-21 MED ORDER — SODIUM CHLORIDE 0.9 % IV SOLN
2400.0000 mg/m2 | INTRAVENOUS | Status: DC
  Filled 2011-11-21: qty 101

## 2011-11-21 MED ORDER — MIRTAZAPINE 15 MG PO TABS: 15.0000 mg | ORAL_TABLET | Freq: Every day | ORAL | Status: DC

## 2011-11-21 MED ORDER — DEXTROSE 5 % IV SOLN: Freq: Once | INTRAVENOUS | Status: DC

## 2011-11-21 MED ORDER — FLUOROURACIL CHEMO INJECTION 2.5 GM/50ML
380.0000 mg/m2 | Freq: Once | INTRAVENOUS | Status: DC
  Filled 2011-11-21: qty 16

## 2011-11-21 MED ORDER — OXALIPLATIN CHEMO INJECTION 100 MG/20ML
85.0000 mg/m2 | Freq: Once | INTRAVENOUS | Status: DC
  Filled 2011-11-21: qty 36

## 2011-11-21 NOTE — Telephone Encounter (Signed)
Wife called to request refill on Remeron--pharmacy has contacted office for refill.

## 2011-11-21 NOTE — Telephone Encounter (Signed)
Marcelino Duster to gve the pt his dec 2012 appt calendar

## 2011-11-21 NOTE — Progress Notes (Signed)
OFFICE PROGRESS NOTE  Interval history:  [name] is a 70 year old man with metastatic colon cancer. He completed cycle 6 FOLFOX on 09/26/2011. Restaging CT evaluation on 10/24/2011 showed improvement. He was scheduled to proceed with cycle 7 of FOLFOX on 10/31/2011. He was hospitalized at [facility] 10/29/2011 with pneumonia. He was discharged home 11/05/2011.  [name] is seen today prior to resuming FOLFOX chemotherapy. He reports that overall he feels well. He feels that he has recovered from the pneumonia. He denies nausea or vomiting. No mouth sores. No diarrhea. He denies neuropathy symptoms. He intermittently feels cold. He denies cold sensitivity. Appetite is erratic.  He intermittently has a "sour"stomach. He is currently on Protonix. He takes Carafate as needed.   Objective: Blood pressure 129/68, pulse 70, temperature 97 F (36.1 C), temperature source Oral, height 6' (1.829 m), weight 189 lb 1.6 oz (85.775 kg).  Oropharynx is without thrush or ulceration. Lungs are clear. No wheezes or rales. Regular cardiac rhythm. Port-A-Cath site is without erythema. Abdomen is soft and nontender. No hepatomegaly. Extremities are without edema.   Lab Results:  hemoglobin 11.4 white count 4.9 absolute count 3.5 platelet count 154,000.  Studies/Results: No results found.  Medications: I have reviewed the patient's current medications.  Assessment/Plan:  1. Metastatic colon cancer, primary tumor (T3 N2b M1) at the ascending colon with metastatic omental nodules noted at the time of the right colectomy 06/08/2011.  Staging PET scan on 07/11/2011 was consistent with metastatic disease involving left neck lymph nodes, abdominal/retroperitoneal lymphadenopathy, peritoneal implants and liver metastases.  He began FOLFOX chemotherapy 07/18/2011.  He completed cycle 6 on 09/26/2011.  Restaging CT evaluation on 10/24/2011 showed improved small abdominopelvic lymph nodes and improved  tiny peritoneal implants.  Prior liver metastases were not well demonstrated on the recent CT scan with the radiologist commenting that the metastases were likely improved.  He was also noted to have a new moderate right pleural effusion. 2. New moderate right pleural effusion on CT scan 10/24/2011. Chest x-ray on 11/02/2011 showed small bilateral pleural effusions, left greater than right. We are obtaining a followup chest x-ray today. 3. Atrial fibrillation no longer taking Coumadin. 4. History of multiple episodes of gastrointestinal bleeding. 5. History of a "cancerous" polyp in 1996. 6. Status post aortic valve repair. 7. History of coronary artery disease. 8. Chronic obstructive pulmonary disease. 9. Anorexia, improved. 10. Situational depression, improved. 11. Anemia secondary to chemotherapy. 12. Fever/chills on 09/13/2011.  Question if related to a urinary tract infection.  There was no clinical evidence for a source of infection and blood/urine cultures remained negative.  He had significant pyuria on a urinalysis.  The fever resolved after treatment with ceftriaxone and ciprofloxacin.  Dr. Truett Perna had a low clinical suspicion for a Port-A-Cath related infection. 13. Hospitalization with pneumonia 10/29/2011 through 11/05/2011.  Disposition-[name] appears stable. Plan to proceed with cycle 7 of FOLFOX today as scheduled. He will return for a followup visit and cycle 8 FOLFOX in 2 weeks. He will contact the office in the interim with any problems.  Plan reviewed with Dr. Truett Perna.  Addendum-the nurse was unable to access [name] Port-A-Cath. She is concerned that the Port-A-Cath has "flipped". Today's treatment will be held. We are referring him for a chest x-ray to followup the right pleural effusion and will also ask the radiologist to evaluate the Port-A-Cath on the chest x-ray.    Lonna Cobb ANP/GNP-BC

## 2011-11-21 NOTE — Progress Notes (Signed)
1030am- Attempted to access port x 2. Unsuccessful. Port appears to have flipped based on assessment of site. Informed Lonna Cobb NP who discussed case with Dr Truett Perna. Per Lonna Cobb, will hold treatment today and send to radiology for cxr. Patient and wife verbalize understanding of plan of care. MW

## 2011-11-23 ENCOUNTER — Encounter (INDEPENDENT_AMBULATORY_CARE_PROVIDER_SITE_OTHER): Payer: Self-pay | Admitting: General Surgery

## 2011-11-23 ENCOUNTER — Ambulatory Visit (INDEPENDENT_AMBULATORY_CARE_PROVIDER_SITE_OTHER): Payer: Medicare Other | Admitting: General Surgery

## 2011-11-23 ENCOUNTER — Other Ambulatory Visit: Payer: Self-pay | Admitting: *Deleted

## 2011-11-23 DIAGNOSIS — Z452 Encounter for adjustment and management of vascular access device: Secondary | ICD-10-CM

## 2011-11-23 NOTE — Progress Notes (Signed)
Dr Truett Perna reports that surgeon was able to flip his PAC over easily in the office. Ready for use. Thinks his golf swing may have loosened a suture. If it flips again, surgeon will re-suture. Notified scheduler to reschedule for 12/4 or 12/5.

## 2011-11-23 NOTE — Progress Notes (Signed)
Kyle Love presents to the office today because it is felt that his Port-A-Cath has flipped overand cannot be accessed.  This was discovered earlier this week. He continues to need chemotherapy. He had been off chemotherapy because he had pneumonia. He does not have any pain around the Port-A-Cath site. A chest x-ray was performed which showed that the Port-A-Cath appeared to be in good position.  Exam: Chest-there is a Port-A-Cath in the right upper chest wall which is flipped over. I was able to manipulate this and flip it back into the right position and it stayed in the proper position.  I placed a piece of tape on the Port-A-Cath to try to hold it in position.  Assessment: Malfunctioning Port-A-Cath secondary to malpositioning.  It has been manipulated and repositioned properly now.   Plan: Avoid heavy or strenuous activity with the right arm for 2 weeks. He will see Kyle Love for treatment next week and I discussed this with Kyle Love.  If this occurs again, will arrange for an outpatient procedure to anchor this to the chest wall permanent sutures.

## 2011-11-23 NOTE — Patient Instructions (Signed)
No lifting or strenous activity with right arm for two weeks.  Dr. Kalman Drape office will call you early next week for an appt.

## 2011-11-26 ENCOUNTER — Telehealth: Payer: Self-pay | Admitting: *Deleted

## 2011-11-27 ENCOUNTER — Other Ambulatory Visit: Payer: Self-pay | Admitting: Oncology

## 2011-11-28 ENCOUNTER — Other Ambulatory Visit: Payer: Self-pay | Admitting: Oncology

## 2011-11-28 ENCOUNTER — Ambulatory Visit (HOSPITAL_BASED_OUTPATIENT_CLINIC_OR_DEPARTMENT_OTHER): Payer: Medicare Other

## 2011-11-28 VITALS — BP 120/63 | HR 103 | Temp 97.3°F

## 2011-11-28 DIAGNOSIS — C787 Secondary malignant neoplasm of liver and intrahepatic bile duct: Secondary | ICD-10-CM

## 2011-11-28 DIAGNOSIS — C772 Secondary and unspecified malignant neoplasm of intra-abdominal lymph nodes: Secondary | ICD-10-CM

## 2011-11-28 DIAGNOSIS — C189 Malignant neoplasm of colon, unspecified: Secondary | ICD-10-CM

## 2011-11-28 DIAGNOSIS — C182 Malignant neoplasm of ascending colon: Secondary | ICD-10-CM

## 2011-11-28 DIAGNOSIS — Z5111 Encounter for antineoplastic chemotherapy: Secondary | ICD-10-CM

## 2011-11-28 MED ORDER — SODIUM CHLORIDE 0.9 % IV SOLN
2400.0000 mg/m2 | INTRAVENOUS | Status: DC
  Administered 2011-11-28: 4950 mg via INTRAVENOUS
  Filled 2011-11-28: qty 99

## 2011-11-28 MED ORDER — ONDANSETRON 8 MG/50ML IVPB (CHCC)
8.0000 mg | Freq: Once | INTRAVENOUS | Status: AC
  Administered 2011-11-28: 8 mg via INTRAVENOUS

## 2011-11-28 MED ORDER — OXALIPLATIN CHEMO INJECTION 100 MG/20ML
85.0000 mg/m2 | Freq: Once | INTRAVENOUS | Status: AC
  Administered 2011-11-28: 175 mg via INTRAVENOUS
  Filled 2011-11-28: qty 35

## 2011-11-28 MED ORDER — FLUOROURACIL CHEMO INJECTION 2.5 GM/50ML
380.0000 mg/m2 | Freq: Once | INTRAVENOUS | Status: AC
  Administered 2011-11-28: 800 mg via INTRAVENOUS
  Filled 2011-11-28: qty 16

## 2011-11-28 MED ORDER — DEXTROSE 5 % IV SOLN
Freq: Once | INTRAVENOUS | Status: AC
  Administered 2011-11-28: 12:00:00 via INTRAVENOUS

## 2011-11-28 MED ORDER — DEXAMETHASONE SODIUM PHOSPHATE 10 MG/ML IJ SOLN
10.0000 mg | Freq: Once | INTRAMUSCULAR | Status: AC
  Administered 2011-11-28: 10 mg via INTRAVENOUS

## 2011-11-28 MED ORDER — LEUCOVORIN CALCIUM INJECTION 350 MG
400.0000 mg/m2 | Freq: Once | INTRAVENOUS | Status: AC
  Administered 2011-11-28: 828 mg via INTRAVENOUS
  Filled 2011-11-28: qty 41.4

## 2011-11-28 NOTE — Patient Instructions (Signed)
Patient received 75ml of Leucovorin; Leucovorin wasted onto floor; nurse omitted connecting leucovorin line to oxaliplatin; consulted with [place] in pharmacy who said that if the patient received 20ml of leucovorin he will be okay, based on past when there was a shortage of Leucovorin; consulted with Lonna Cobb who okayed that patient receive remaining 75ml of Leucovorin, based on pharmacist message; explained situation to patient; discharged home with wife with no complaints; patient has Rx for pain and nausea meds at home.

## 2011-11-29 ENCOUNTER — Other Ambulatory Visit: Payer: Self-pay | Admitting: *Deleted

## 2011-11-29 DIAGNOSIS — C182 Malignant neoplasm of ascending colon: Secondary | ICD-10-CM

## 2011-11-29 MED ORDER — ONDANSETRON HCL 8 MG PO TABS: 8.0000 mg | ORAL_TABLET | Freq: Three times a day (TID) | ORAL | Status: AC | PRN

## 2011-11-29 NOTE — Telephone Encounter (Signed)
Call from pt's wife reporting nausea with one episode of emesis. S/P FOLFOX on 11/28/11 due for pump DC on 11/30/11. Reviewed with Misty Stanley, NP. Order received for Zofran Q8hours PRN. Called pt's wife with instructions. Made her aware that pt may take both Compazine and Zofran for relief. She verbalized understanding.

## 2011-11-30 ENCOUNTER — Ambulatory Visit (HOSPITAL_BASED_OUTPATIENT_CLINIC_OR_DEPARTMENT_OTHER): Payer: Medicare Other

## 2011-11-30 ENCOUNTER — Telehealth: Payer: Self-pay | Admitting: Oncology

## 2011-11-30 DIAGNOSIS — C182 Malignant neoplasm of ascending colon: Secondary | ICD-10-CM

## 2011-11-30 DIAGNOSIS — C189 Malignant neoplasm of colon, unspecified: Secondary | ICD-10-CM

## 2011-11-30 DIAGNOSIS — C787 Secondary malignant neoplasm of liver and intrahepatic bile duct: Secondary | ICD-10-CM

## 2011-11-30 DIAGNOSIS — C772 Secondary and unspecified malignant neoplasm of intra-abdominal lymph nodes: Secondary | ICD-10-CM

## 2011-11-30 MED ORDER — HEPARIN SOD (PORK) LOCK FLUSH 100 UNIT/ML IV SOLN
500.0000 [IU] | Freq: Once | INTRAVENOUS | Status: AC | PRN
  Administered 2011-11-30: 500 [IU]
  Filled 2011-11-30: qty 5

## 2011-11-30 MED ORDER — SODIUM CHLORIDE 0.9 % IJ SOLN
10.0000 mL | INTRAMUSCULAR | Status: DC | PRN
  Administered 2011-11-30: 10 mL
  Filled 2011-11-30: qty 10

## 2011-11-30 NOTE — Telephone Encounter (Signed)
Made a note °

## 2011-12-05 ENCOUNTER — Other Ambulatory Visit: Payer: Medicare Other | Admitting: Lab

## 2011-12-05 ENCOUNTER — Inpatient Hospital Stay: Payer: Medicare Other

## 2011-12-05 ENCOUNTER — Ambulatory Visit: Payer: Medicare Other | Admitting: Oncology

## 2011-12-07 ENCOUNTER — Telehealth: Payer: Self-pay | Admitting: Oncology

## 2011-12-07 NOTE — Telephone Encounter (Signed)
Patient wife call today inquiring about the bill they paid,i told her that [employer] [employer] have not heard back from [facility] [employer] [facility].

## 2011-12-12 ENCOUNTER — Ambulatory Visit (HOSPITAL_BASED_OUTPATIENT_CLINIC_OR_DEPARTMENT_OTHER): Payer: Medicare Other | Admitting: Oncology

## 2011-12-12 ENCOUNTER — Encounter: Payer: Self-pay | Admitting: *Deleted

## 2011-12-12 ENCOUNTER — Other Ambulatory Visit (HOSPITAL_BASED_OUTPATIENT_CLINIC_OR_DEPARTMENT_OTHER): Payer: Medicare Other | Admitting: Lab

## 2011-12-12 ENCOUNTER — Telehealth: Payer: Self-pay | Admitting: Oncology

## 2011-12-12 ENCOUNTER — Ambulatory Visit (HOSPITAL_BASED_OUTPATIENT_CLINIC_OR_DEPARTMENT_OTHER): Payer: Medicare Other

## 2011-12-12 VITALS — BP 117/66 | HR 95 | Temp 97.1°F | Ht 71.0 in | Wt 184.7 lb

## 2011-12-12 DIAGNOSIS — I4891 Unspecified atrial fibrillation: Secondary | ICD-10-CM

## 2011-12-12 DIAGNOSIS — C189 Malignant neoplasm of colon, unspecified: Secondary | ICD-10-CM

## 2011-12-12 DIAGNOSIS — Z79899 Other long term (current) drug therapy: Secondary | ICD-10-CM

## 2011-12-12 DIAGNOSIS — C787 Secondary malignant neoplasm of liver and intrahepatic bile duct: Secondary | ICD-10-CM

## 2011-12-12 DIAGNOSIS — C778 Secondary and unspecified malignant neoplasm of lymph nodes of multiple regions: Secondary | ICD-10-CM

## 2011-12-12 DIAGNOSIS — Z5111 Encounter for antineoplastic chemotherapy: Secondary | ICD-10-CM

## 2011-12-12 DIAGNOSIS — J90 Pleural effusion, not elsewhere classified: Secondary | ICD-10-CM

## 2011-12-12 LAB — CBC WITH DIFFERENTIAL/PLATELET
Basophils Absolute: 0 10*3/uL (ref 0.0–0.1)
Eosinophils Absolute: 0.2 10*3/uL (ref 0.0–0.5)
HCT: 34.1 % — ABNORMAL LOW (ref 38.4–49.9)
HGB: 11.6 g/dL — ABNORMAL LOW (ref 13.0–17.1)
LYMPH%: 20.4 % (ref 14.0–49.0)
MCHC: 33.9 g/dL (ref 32.0–36.0)
MONO#: 0.4 10*3/uL (ref 0.1–0.9)
NEUT%: 62.8 % (ref 39.0–75.0)
Platelets: 138 10*3/uL — ABNORMAL LOW (ref 140–400)
WBC: 3.9 10*3/uL — ABNORMAL LOW (ref 4.0–10.3)
lymph#: 0.8 10*3/uL — ABNORMAL LOW (ref 0.9–3.3)

## 2011-12-12 LAB — COMPREHENSIVE METABOLIC PANEL
BUN: 18 mg/dL (ref 6–23)
CO2: 30 mEq/L (ref 19–32)
Calcium: 9.2 mg/dL (ref 8.4–10.5)
Chloride: 104 mEq/L (ref 96–112)
Creatinine, Ser: 1.14 mg/dL (ref 0.50–1.35)
Glucose, Bld: 115 mg/dL — ABNORMAL HIGH (ref 70–99)
Total Bilirubin: 0.4 mg/dL (ref 0.3–1.2)

## 2011-12-12 MED ORDER — OXALIPLATIN CHEMO INJECTION 100 MG/20ML
85.0000 mg/m2 | Freq: Once | INTRAVENOUS | Status: AC
  Administered 2011-12-12: 175 mg via INTRAVENOUS
  Filled 2011-12-12: qty 35

## 2011-12-12 MED ORDER — ONDANSETRON 8 MG/50ML IVPB (CHCC)
8.0000 mg | Freq: Once | INTRAVENOUS | Status: AC
  Administered 2011-12-12: 8 mg via INTRAVENOUS

## 2011-12-12 MED ORDER — SODIUM CHLORIDE 0.9 % IV SOLN
2400.0000 mg/m2 | INTRAVENOUS | Status: DC
  Administered 2011-12-12: 4950 mg via INTRAVENOUS
  Filled 2011-12-12: qty 99

## 2011-12-12 MED ORDER — FLUOROURACIL CHEMO INJECTION 2.5 GM/50ML
380.0000 mg/m2 | Freq: Once | INTRAVENOUS | Status: AC
  Administered 2011-12-12: 800 mg via INTRAVENOUS
  Filled 2011-12-12: qty 16

## 2011-12-12 MED ORDER — LEUCOVORIN CALCIUM INJECTION 350 MG
400.0000 mg/m2 | Freq: Once | INTRAVENOUS | Status: AC
  Administered 2011-12-12: 828 mg via INTRAVENOUS
  Filled 2011-12-12: qty 41.4

## 2011-12-12 MED ORDER — DEXAMETHASONE SODIUM PHOSPHATE 10 MG/ML IJ SOLN
10.0000 mg | Freq: Once | INTRAMUSCULAR | Status: AC
  Administered 2011-12-12: 10 mg via INTRAVENOUS

## 2011-12-12 MED ORDER — DEXTROSE 5 % IV SOLN
Freq: Once | INTRAVENOUS | Status: AC
  Administered 2011-12-12: 14:00:00 via INTRAVENOUS

## 2011-12-12 NOTE — Telephone Encounter (Signed)
gve the pt his jan 2013 appt calendar °

## 2011-12-12 NOTE — Progress Notes (Signed)
OFFICE PROGRESS NOTE   INTERVAL HISTORY:   Kyle Love returns as scheduled. He was last treated with FOLFOX on December 5. Treatment was delayed due to to a "flipped "Port-A-Cath. Dr. Abbey Chatters was able to reposition the Port-A-Cath. He reports no further problems with the Port-A-Cath. He denies nausea, mouth sores, and diarrhea. He denies peripheral numbness and tingling. He has a mild cold sensation in the feet. He has no difficulty walking.  Objective:  Vital signs in last 24 hours:  Blood pressure 117/66, pulse 95, temperature 97.1 F (36.2 C), temperature source Oral, height 5\' 11"  (1.803 m), weight 184 lb 11.2 oz (83.779 kg).    HEENT: No thrush or ulcers Resp: Lungs clear bilaterally Cardio: Irregular GI: Nontender. No hepatomegaly Vascular: The left lower leg is slightly larger than the right side. No edema. Neuro: The vibratory sense is intact at the fingers on the left. Very mild decrease in vibratory sense at the fingertips on the right. Very mild decrease in vibratory sense at the toes.    Portacath/PICC-without erythema  Lab Results:  Lab Results  Component Value Date   WBC 3.9* 12/12/2011   HGB 11.6* 12/12/2011   HCT 34.1* 12/12/2011   MCV 93.1 12/12/2011   PLT 138* 12/12/2011      Medications: I have reviewed the patient's current medications.  Assessment/Plan: 1. Metastatic colon cancer, primary tumor (T3 N2b M1) at the ascending colon with metastatic omental nodules noted at the time of the right colectomy 06/08/2011. Staging PET scan on 07/11/2011 was consistent with metastatic disease involving left neck lymph nodes, abdominal/retroperitoneal lymphadenopathy, peritoneal implants and liver metastases. He began FOLFOX chemotherapy 07/18/2011. He completed cycle 6 on 09/26/2011. Restaging CT evaluation on 10/24/2011 showed improved small abdominopelvic lymph nodes and improved tiny peritoneal implants. Prior liver metastases were not well demonstrated on the  recent CT scan with the radiologist commenting that the metastases were likely improved. He was also noted to have a new moderate right pleural effusion. 2. New moderate right pleural effusion on CT scan 10/24/2011. Chest x-ray on 11/02/2011 showed small bilateral pleural effusions, left greater than right. A chest x-ray on November 29 showed improved interstitial edema and bilateral effusions. 3. Atrial fibrillation no longer taking Coumadin. 4. History of multiple episodes of gastrointestinal bleeding. 5. History of a "cancerous" polyp in 1996. 6. Status post aortic valve repair. 7. History of coronary artery disease. 8. Chronic obstructive pulmonary disease. 9. Anorexia, improved. 10. Situational depression, improved. 11. Anemia secondary to chemotherapy. 12. Fever/chills on 09/13/2011. Question if related to a urinary tract infection. There was no clinical evidence for a source of infection and blood/urine cultures remained negative. He had significant pyuria on a urinalysis. The fever resolved after treatment with ceftriaxone and ciprofloxacin. Dr. Truett Perna had a low clinical suspicion for a Port-A-Cath related infection. 13. Hospitalization with pneumonia 10/29/2011 through 11/05/2011. 14. Malpositioned Port-A-Cath, status post in office repair by Dr. Abbey Chatters 11/23/2011      Disposition:  He appears well. He'll complete another cycle of FOLFOX chemotherapy today. He does not have neuropathy symptoms. He'll return for the next cycle of chemotherapy on January 2. He is scheduled for an office visit and chemotherapy on January 16. The plan is to obtain a restaging CT evaluation after 12 total cycles of chemotherapy. He will let us know if he develops neuropathy symptoms.   Kyle Shutters, MD  12/12/2011  2:15 PM

## 2011-12-14 ENCOUNTER — Ambulatory Visit (HOSPITAL_BASED_OUTPATIENT_CLINIC_OR_DEPARTMENT_OTHER): Payer: Medicare Other

## 2011-12-14 VITALS — BP 114/68 | HR 105 | Temp 96.9°F

## 2011-12-14 DIAGNOSIS — Z469 Encounter for fitting and adjustment of unspecified device: Secondary | ICD-10-CM

## 2011-12-14 DIAGNOSIS — C189 Malignant neoplasm of colon, unspecified: Secondary | ICD-10-CM

## 2011-12-14 MED ORDER — SODIUM CHLORIDE 0.9 % IJ SOLN
10.0000 mL | INTRAMUSCULAR | Status: DC | PRN
  Administered 2011-12-14: 10 mL
  Filled 2011-12-14: qty 10

## 2011-12-14 MED ORDER — HEPARIN SOD (PORK) LOCK FLUSH 100 UNIT/ML IV SOLN
500.0000 [IU] | Freq: Once | INTRAVENOUS | Status: AC | PRN
  Administered 2011-12-14: 500 [IU]
  Filled 2011-12-14: qty 5

## 2011-12-14 NOTE — Patient Instructions (Signed)
Call MD for problems 

## 2011-12-19 ENCOUNTER — Inpatient Hospital Stay: Payer: Medicare Other

## 2011-12-23 ENCOUNTER — Other Ambulatory Visit: Payer: Self-pay | Admitting: Oncology

## 2011-12-26 ENCOUNTER — Other Ambulatory Visit: Payer: Medicare Other | Admitting: Lab

## 2011-12-26 ENCOUNTER — Ambulatory Visit (HOSPITAL_BASED_OUTPATIENT_CLINIC_OR_DEPARTMENT_OTHER): Payer: Medicare Other

## 2011-12-26 VITALS — BP 134/77 | HR 93 | Temp 97.3°F

## 2011-12-26 DIAGNOSIS — C189 Malignant neoplasm of colon, unspecified: Secondary | ICD-10-CM

## 2011-12-26 LAB — COMPREHENSIVE METABOLIC PANEL
ALT: 22 U/L (ref 0–53)
AST: 32 U/L (ref 0–37)
Alkaline Phosphatase: 131 U/L — ABNORMAL HIGH (ref 39–117)
CO2: 31 mEq/L (ref 19–32)
Creatinine, Ser: 1.18 mg/dL (ref 0.50–1.35)
Sodium: 138 mEq/L (ref 135–145)
Total Bilirubin: 0.3 mg/dL (ref 0.3–1.2)

## 2011-12-26 LAB — CBC WITH DIFFERENTIAL/PLATELET
BASO%: 0.7 % (ref 0.0–2.0)
Basophils Absolute: 0 10*3/uL (ref 0.0–0.1)
Eosinophils Absolute: 0.2 10*3/uL (ref 0.0–0.5)
HCT: 33.2 % — ABNORMAL LOW (ref 38.4–49.9)
HGB: 11.2 g/dL — ABNORMAL LOW (ref 13.0–17.1)
MCHC: 33.7 g/dL (ref 32.0–36.0)
MONO#: 0.7 10*3/uL (ref 0.1–0.9)
NEUT#: 2.2 10*3/uL (ref 1.5–6.5)
NEUT%: 51.5 % (ref 39.0–75.0)
WBC: 4.3 10*3/uL (ref 4.0–10.3)
lymph#: 1.2 10*3/uL (ref 0.9–3.3)

## 2011-12-26 MED ORDER — ONDANSETRON 8 MG/50ML IVPB (CHCC)
8.0000 mg | Freq: Once | INTRAVENOUS | Status: AC
  Administered 2011-12-26: 8 mg via INTRAVENOUS

## 2011-12-26 MED ORDER — FLUOROURACIL CHEMO INJECTION 2.5 GM/50ML
380.0000 mg/m2 | Freq: Once | INTRAVENOUS | Status: AC
  Administered 2011-12-26: 800 mg via INTRAVENOUS
  Filled 2011-12-26: qty 16

## 2011-12-26 MED ORDER — HEPARIN SOD (PORK) LOCK FLUSH 100 UNIT/ML IV SOLN
500.0000 [IU] | Freq: Once | INTRAVENOUS | Status: DC | PRN
  Filled 2011-12-26: qty 5

## 2011-12-26 MED ORDER — LEUCOVORIN CALCIUM INJECTION 350 MG
400.0000 mg/m2 | Freq: Once | INTRAVENOUS | Status: AC
  Administered 2011-12-26: 828 mg via INTRAVENOUS
  Filled 2011-12-26: qty 41.4

## 2011-12-26 MED ORDER — DEXAMETHASONE SODIUM PHOSPHATE 10 MG/ML IJ SOLN
10.0000 mg | Freq: Once | INTRAMUSCULAR | Status: AC
  Administered 2011-12-26: 10 mg via INTRAVENOUS

## 2011-12-26 MED ORDER — SODIUM CHLORIDE 0.9 % IV SOLN
2400.0000 mg/m2 | INTRAVENOUS | Status: DC
  Administered 2011-12-26: 4950 mg via INTRAVENOUS
  Filled 2011-12-26: qty 99

## 2011-12-26 MED ORDER — DEXTROSE 5 % IV SOLN
Freq: Once | INTRAVENOUS | Status: AC
  Administered 2011-12-26: 10:00:00 via INTRAVENOUS

## 2011-12-26 MED ORDER — OXALIPLATIN CHEMO INJECTION 100 MG/20ML
85.0000 mg/m2 | Freq: Once | INTRAVENOUS | Status: AC
  Administered 2011-12-26: 175 mg via INTRAVENOUS
  Filled 2011-12-26: qty 35

## 2011-12-26 MED ORDER — SODIUM CHLORIDE 0.9 % IJ SOLN
10.0000 mL | INTRAMUSCULAR | Status: DC | PRN
  Filled 2011-12-26: qty 10

## 2011-12-28 ENCOUNTER — Ambulatory Visit (HOSPITAL_BASED_OUTPATIENT_CLINIC_OR_DEPARTMENT_OTHER): Payer: Medicare Other

## 2011-12-28 VITALS — BP 121/69 | HR 90 | Temp 97.7°F

## 2011-12-28 DIAGNOSIS — C189 Malignant neoplasm of colon, unspecified: Secondary | ICD-10-CM

## 2011-12-28 MED ORDER — HEPARIN SOD (PORK) LOCK FLUSH 100 UNIT/ML IV SOLN
500.0000 [IU] | Freq: Once | INTRAVENOUS | Status: AC | PRN
  Administered 2011-12-28: 500 [IU]
  Filled 2011-12-28: qty 5

## 2011-12-28 MED ORDER — SODIUM CHLORIDE 0.9 % IJ SOLN
10.0000 mL | INTRAMUSCULAR | Status: DC | PRN
  Administered 2011-12-28: 10 mL
  Filled 2011-12-28: qty 10

## 2012-01-01 ENCOUNTER — Other Ambulatory Visit: Payer: Self-pay | Admitting: *Deleted

## 2012-01-01 ENCOUNTER — Other Ambulatory Visit: Payer: Self-pay | Admitting: Certified Registered Nurse Anesthetist

## 2012-01-01 DIAGNOSIS — C182 Malignant neoplasm of ascending colon: Secondary | ICD-10-CM

## 2012-01-01 MED ORDER — ALPRAZOLAM 0.5 MG PO TABS: ORAL_TABLET | ORAL | Status: AC

## 2012-01-08 ENCOUNTER — Other Ambulatory Visit: Payer: Self-pay | Admitting: Oncology

## 2012-01-09 ENCOUNTER — Other Ambulatory Visit (HOSPITAL_BASED_OUTPATIENT_CLINIC_OR_DEPARTMENT_OTHER): Payer: Medicare Other | Admitting: Lab

## 2012-01-09 ENCOUNTER — Ambulatory Visit: Payer: Medicare Other | Admitting: Oncology

## 2012-01-09 ENCOUNTER — Ambulatory Visit (HOSPITAL_BASED_OUTPATIENT_CLINIC_OR_DEPARTMENT_OTHER): Payer: Medicare Other

## 2012-01-09 VITALS — BP 121/69 | HR 76 | Temp 96.9°F | Ht 71.0 in | Wt 189.3 lb

## 2012-01-09 DIAGNOSIS — C189 Malignant neoplasm of colon, unspecified: Secondary | ICD-10-CM

## 2012-01-09 DIAGNOSIS — C786 Secondary malignant neoplasm of retroperitoneum and peritoneum: Secondary | ICD-10-CM

## 2012-01-09 DIAGNOSIS — D6481 Anemia due to antineoplastic chemotherapy: Secondary | ICD-10-CM

## 2012-01-09 DIAGNOSIS — Z5111 Encounter for antineoplastic chemotherapy: Secondary | ICD-10-CM

## 2012-01-09 DIAGNOSIS — C182 Malignant neoplasm of ascending colon: Secondary | ICD-10-CM

## 2012-01-09 LAB — CBC WITH DIFFERENTIAL/PLATELET
BASO%: 0.4 % (ref 0.0–2.0)
Eosinophils Absolute: 0.1 10*3/uL (ref 0.0–0.5)
MCHC: 34 g/dL (ref 32.0–36.0)
MONO#: 0.5 10*3/uL (ref 0.1–0.9)
NEUT#: 1.6 10*3/uL (ref 1.5–6.5)
RBC: 3.39 10*6/uL — ABNORMAL LOW (ref 4.20–5.82)
RDW: 18.8 % — ABNORMAL HIGH (ref 11.0–14.6)
WBC: 2.8 10*3/uL — ABNORMAL LOW (ref 4.0–10.3)
lymph#: 0.6 10*3/uL — ABNORMAL LOW (ref 0.9–3.3)

## 2012-01-09 LAB — COMPREHENSIVE METABOLIC PANEL
ALT: 18 U/L (ref 0–53)
Albumin: 3.1 g/dL — ABNORMAL LOW (ref 3.5–5.2)
CO2: 27 mEq/L (ref 19–32)
Calcium: 8.9 mg/dL (ref 8.4–10.5)
Chloride: 104 mEq/L (ref 96–112)
Glucose, Bld: 111 mg/dL — ABNORMAL HIGH (ref 70–99)
Potassium: 4 mEq/L (ref 3.5–5.3)
Sodium: 138 mEq/L (ref 135–145)
Total Bilirubin: 0.4 mg/dL (ref 0.3–1.2)
Total Protein: 7.1 g/dL (ref 6.0–8.3)

## 2012-01-09 MED ORDER — SODIUM CHLORIDE 0.9 % IJ SOLN
10.0000 mL | INTRAMUSCULAR | Status: DC | PRN
  Filled 2012-01-09: qty 10

## 2012-01-09 MED ORDER — ONDANSETRON 8 MG/50ML IVPB (CHCC)
8.0000 mg | Freq: Once | INTRAVENOUS | Status: AC
  Administered 2012-01-09: 8 mg via INTRAVENOUS

## 2012-01-09 MED ORDER — OXALIPLATIN CHEMO INJECTION 100 MG/20ML
85.0000 mg/m2 | Freq: Once | INTRAVENOUS | Status: AC
  Administered 2012-01-09: 175 mg via INTRAVENOUS
  Filled 2012-01-09: qty 35

## 2012-01-09 MED ORDER — SODIUM CHLORIDE 0.9 % IV SOLN
2400.0000 mg/m2 | INTRAVENOUS | Status: DC
  Administered 2012-01-09: 4950 mg via INTRAVENOUS
  Filled 2012-01-09: qty 99

## 2012-01-09 MED ORDER — DEXAMETHASONE SODIUM PHOSPHATE 10 MG/ML IJ SOLN: 10.0000 mg | Freq: Once | INTRAMUSCULAR | Status: DC

## 2012-01-09 MED ORDER — HEPARIN SOD (PORK) LOCK FLUSH 100 UNIT/ML IV SOLN
500.0000 [IU] | Freq: Once | INTRAVENOUS | Status: DC | PRN
  Filled 2012-01-09: qty 5

## 2012-01-09 MED ORDER — DEXTROSE 5 % IV SOLN
Freq: Once | INTRAVENOUS | Status: AC
  Administered 2012-01-09: 11:00:00 via INTRAVENOUS

## 2012-01-09 MED ORDER — FLUOROURACIL CHEMO INJECTION 2.5 GM/50ML
380.0000 mg/m2 | Freq: Once | INTRAVENOUS | Status: AC
  Administered 2012-01-09: 800 mg via INTRAVENOUS
  Filled 2012-01-09: qty 16

## 2012-01-09 MED ORDER — DEXTROSE 5 % IV SOLN
400.0000 mg/m2 | Freq: Once | INTRAVENOUS | Status: AC
  Administered 2012-01-09: 828 mg via INTRAVENOUS
  Filled 2012-01-09: qty 41.4

## 2012-01-09 NOTE — Progress Notes (Signed)
OFFICE PROGRESS NOTE   INTERVAL HISTORY:   He returns as scheduled. He completed treatment with FOLFOX on 12/12/2011 and 12/26/2011. He reports tolerating the chemotherapy well. He denies nausea, mouth sores, and diarrhea. He denies neuropathy symptoms at present. He works 2 days per week antiplatelet cough last weekend.  Objective:  Vital signs in last 24 hours:  Blood pressure 121/69, pulse 76, temperature 96.9 F (36.1 C), temperature source Oral, height 5\' 11"  (1.803 m), weight 189 lb 4.8 oz (85.866 kg).    HEENT: No thrush or ulcers Resp: Lungs clear bilaterally, distant breath sounds at the right greater than left chest. No respiratory distress. Cardio: Irregular GI: No hepatomegaly. Nontender. Vascular: The left lower leg is larger than the right side. No erythema or edema. Neuro: Very mild decrease in vibratory sense at the fingertip bilaterally.    Portacath/PICC-without erythema  Lab Results:  Lab Results  Component Value Date   WBC 2.8* 01/09/2012   HGB 10.6* 01/09/2012   HCT 31.3* 01/09/2012   MCV 92.2 01/09/2012   PLT 120* 01/09/2012   ANC 1.6   Medications: I have reviewed the patient's current medications.  Assessment/Plan: 1. Metastatic colon cancer, primary tumor (T3 N2b M1) at the ascending colon with metastatic omental nodules noted at the time of the right colectomy 06/08/2011. Staging PET scan on 07/11/2011 was consistent with metastatic disease involving left neck lymph nodes, abdominal/retroperitoneal lymphadenopathy, peritoneal implants and liver metastases. He began FOLFOX chemotherapy 07/18/2011. He completed cycle 6 on 09/26/2011. Restaging CT evaluation on 10/24/2011 showed improved small abdominopelvic lymph nodes and improved tiny peritoneal implants. Prior liver metastases were not well demonstrated on the recent CT scan with the radiologist commenting that the metastases were likely improved. He was also noted to have a new moderate right pleural  effusion.  - -He resumed treatment with FOLFOX on 11/28/2011, he has completed 3 additional treatments to date.  2. New moderate right pleural effusion on CT scan 10/24/2011. Chest x-ray on 11/02/2011 showed small bilateral pleural effusions, left greater than right. A chest x-ray on November 29 showed improved interstitial edema and bilateral effusions. 3. Atrial fibrillation no longer taking Coumadin. 4. History of multiple episodes of gastrointestinal bleeding. 5. History of a "cancerous" polyp in 1996. 6. Status post aortic valve repair. 7. History of coronary artery disease. 8. Chronic obstructive pulmonary disease. 9. Anorexia, improved. 10. Situational depression, improved. 11. Anemia secondary to chemotherapy. 12. Fever/chills on 09/13/2011. Question if related to a urinary tract infection. There was no clinical evidence for a source of infection and blood/urine cultures remained negative. He had significant pyuria on a urinalysis. The fever resolved after treatment with ceftriaxone and ciprofloxacin. We had a low clinical suspicion for a Port-A-Cath related infection. 13. Hospitalization with pneumonia 10/29/2011 through 11/05/2011. 14. Malpositioned Port-A-Cath, status post in office repair by Dr. Abbey Chatters 11/23/2011   Disposition:  He appears stable. He is tolerating the FOLFOX well. He will complete the fourth cycle of course #2 today. He will return for cycle 5 in 2 weeks. He is scheduled for an office visit and cycle 6 chemotherapy in 4 weeks. He will be referred for a restaging CT evaluation after cycle 6.   Lucile Shutters, MD  01/09/2012  11:33 AM

## 2012-01-09 NOTE — Patient Instructions (Signed)
Patient aware of next appointment; discharged home with wife.

## 2012-01-11 ENCOUNTER — Ambulatory Visit (HOSPITAL_BASED_OUTPATIENT_CLINIC_OR_DEPARTMENT_OTHER): Payer: Medicare Other

## 2012-01-11 VITALS — BP 119/67 | HR 104 | Temp 98.3°F

## 2012-01-11 DIAGNOSIS — C189 Malignant neoplasm of colon, unspecified: Secondary | ICD-10-CM

## 2012-01-11 MED ORDER — SODIUM CHLORIDE 0.9 % IJ SOLN
10.0000 mL | INTRAMUSCULAR | Status: DC | PRN
  Administered 2012-01-11: 10 mL
  Filled 2012-01-11: qty 10

## 2012-01-11 MED ORDER — HEPARIN SOD (PORK) LOCK FLUSH 100 UNIT/ML IV SOLN
500.0000 [IU] | Freq: Once | INTRAVENOUS | Status: AC | PRN
  Administered 2012-01-11: 500 [IU]
  Filled 2012-01-11: qty 5

## 2012-01-21 ENCOUNTER — Encounter: Payer: Self-pay | Admitting: Cardiovascular Disease

## 2012-01-21 ENCOUNTER — Ambulatory Visit (INDEPENDENT_AMBULATORY_CARE_PROVIDER_SITE_OTHER): Payer: Medicare Other | Admitting: Cardiovascular Disease

## 2012-01-21 DIAGNOSIS — C189 Malignant neoplasm of colon, unspecified: Secondary | ICD-10-CM

## 2012-01-21 DIAGNOSIS — I4891 Unspecified atrial fibrillation: Secondary | ICD-10-CM

## 2012-01-21 DIAGNOSIS — Z954 Presence of other heart-valve replacement: Secondary | ICD-10-CM

## 2012-01-21 DIAGNOSIS — J449 Chronic obstructive pulmonary disease, unspecified: Secondary | ICD-10-CM

## 2012-01-21 NOTE — Patient Instructions (Signed)
Your physician wants you to follow-up in:  6 MONTHS WITH DR NISHAN  You will receive a reminder letter in the mail two months in advance. If you don't receive a letter, please call our office to schedule the follow-up appointment. Your physician recommends that you continue on your current medications as directed. Please refer to the Current Medication list given to you today. 

## 2012-01-21 NOTE — Assessment & Plan Note (Signed)
F/U Dr Tora Duck  Has chemo this Wendsday and one more RX in 2 weeks then restaging.

## 2012-01-21 NOTE — Progress Notes (Signed)
Patient ID: Kyle Love, male   DOB: [birth year], 71 y.o.   MRN: [id number] 71 yo with chronic afib and tissue AVR 2008. Undergoing Rx for metastatic stage 4 colon CA. Has had lots of issues with anema and bleeding. After debulking surgery elected to not resume coumadin. Since hospital D/C weight down 26 lbs. Has port a cath and is getting chemoRx. 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 He indicates post op PET scan more encouraging. Has good days and bad days. Appetite poor. Wife just had knee surgery with [employer]. Discussed issues of regulating coumadin during chemo and weight loss and I still prefer to keep him off it. No SSCP, TIA;s, dyspnea or edema More dyspnea. No cough. No SSCP. No palpitations PND or orthopnea    Echo 1/12 EF 55-65% Mean gradient 6 no AR AVR normal mild to moderate MR   Seen by Dr Tora Duck 1/13:  Summary of CA  Metastatic colon cancer, primary tumor (T3 N2b M1) at the ascending colon with metastatic omental nodules noted at the time of the right colectomy 06/08/2011. Staging PET scan on 07/11/2011 was consistent with metastatic disease involving left neck lymph nodes, abdominal/retroperitoneal lymphadenopathy, peritoneal implants and liver metastases. He began FOLFOX chemotherapy 07/18/2011. He completed cycle 6 on 09/26/2011. Restaging CT evaluation on 10/24/2011 showed improved small abdominopelvic lymph nodes and improved tiny peritoneal implants. Prior liver metastases were not well demonstrated on the recent CT scan with the radiologist commenting that the metastases were likely improved. He was also noted to have a new moderate right pleural effusion. - -He resumed treatment with FOLFOX on 11/28/2011, he has completed 3 additional treatments to date.    ROS: Denies fever, malais, weight loss, blurry vision, decreased visual acuity, cough, sputum, SOB, hemoptysis, pleuritic pain, palpitaitons, heartburn, abdominal pain, melena, lower extremity edema, claudication, or  rash.  All other systems reviewed and negative  General:  No change in weight since November 12 !! Affect appropriate Healthy:  appears stated age HEENT: normal Neck supple with no adenopathy JVP normal no bruits no thyromegaly Lungs clear with no wheezing and good diaphragmatic motion Heart:  S1/S2 no murmur, no rub, gallop or click PMI normal Abdomen: benighn, BS positve, no tenderness, no AAA no bruit.  No HSM or HJR Distal pulses intact with no bruits No edema Neuro non-focal Skin warm and dry No muscular weakness   Current Outpatient Prescriptions  Medication Sig Dispense Refill  . aspirin 81 MG tablet Take 81 mg by mouth daily.        . Cyanocobalamin (VITAMIN B 12 PO) Take by mouth.        . docusate sodium (COLACE) 100 MG capsule Take 100 mg by mouth 2 (two) times daily.        . folic acid (FOLVITE) 400 MCG tablet Take 400 mcg by mouth daily.        . furosemide (LASIX) 20 MG tablet Take 1 tablet (20 mg total) by mouth daily.  30 tablet  12  . lidocaine-prilocaine (EMLA) cream Ad lib.      . metoprolol (LOPRESSOR) 100 MG tablet Take 1 tablet (100 mg total) by mouth 2 (two) times daily.  62 tablet  0  . mirtazapine (REMERON) 15 MG tablet Take 15 mg by mouth at bedtime.      . ondansetron (ZOFRAN) 8 MG tablet Take by mouth every 8 (eight) hours as needed.      . pantoprazole (PROTONIX) 40 MG tablet Take 40 mg by mouth  daily.       Marland Kitchen ALPRAZolam (XANAX) 0.5 MG tablet TAKE 1/2 TO ONE TAB BY MOUTH EVERY 12 HOURS PRN ANXIETY. MAY ALSO USE FOR SLEEP PRN.  30 tablet  0  . HYDROcodone-acetaminophen (NORCO) 10-325 MG per tablet Norco 5/325 #30 w/ no refills  50 tablet  0  . prochlorperazine (COMPAZINE) 10 MG tablet Take 10 mg by mouth every 6 (six) hours as needed. Nausea         Allergies  Diltiazem hcl; Penicillins; and Plasma human  Electrocardiogram:  Assessment and Plan

## 2012-01-21 NOTE — Assessment & Plan Note (Signed)
Tissue valve normal on exam.  SBE prophylaxis

## 2012-01-21 NOTE — Assessment & Plan Note (Signed)
Baseline dsypnea.  Clinically right pleural effusion gone  F/U CXR per oncology

## 2012-01-21 NOTE — Assessment & Plan Note (Signed)
Good rate control  NO coumadin due to metastatic colon CA

## 2012-01-22 ENCOUNTER — Other Ambulatory Visit: Payer: Self-pay | Admitting: Oncology

## 2012-01-23 ENCOUNTER — Ambulatory Visit (HOSPITAL_BASED_OUTPATIENT_CLINIC_OR_DEPARTMENT_OTHER): Payer: Medicare Other

## 2012-01-23 ENCOUNTER — Other Ambulatory Visit: Payer: Medicare Other | Admitting: Lab

## 2012-01-23 VITALS — BP 119/65 | HR 76 | Temp 98.0°F

## 2012-01-23 DIAGNOSIS — Z5111 Encounter for antineoplastic chemotherapy: Secondary | ICD-10-CM

## 2012-01-23 DIAGNOSIS — C189 Malignant neoplasm of colon, unspecified: Secondary | ICD-10-CM

## 2012-01-23 LAB — COMPREHENSIVE METABOLIC PANEL
Albumin: 2.9 g/dL — ABNORMAL LOW (ref 3.5–5.2)
Alkaline Phosphatase: 139 U/L — ABNORMAL HIGH (ref 39–117)
BUN: 27 mg/dL — ABNORMAL HIGH (ref 6–23)
Creatinine, Ser: 1.1 mg/dL (ref 0.50–1.35)
Glucose, Bld: 125 mg/dL — ABNORMAL HIGH (ref 70–99)
Potassium: 4 mEq/L (ref 3.5–5.3)

## 2012-01-23 LAB — CBC WITH DIFFERENTIAL/PLATELET
Basophils Absolute: 0 10*3/uL (ref 0.0–0.1)
EOS%: 4.9 % (ref 0.0–7.0)
HCT: 31.1 % — ABNORMAL LOW (ref 38.4–49.9)
HGB: 10.4 g/dL — ABNORMAL LOW (ref 13.0–17.1)
LYMPH%: 20.9 % (ref 14.0–49.0)
MCH: 30.1 pg (ref 27.2–33.4)
MCV: 89.9 fL (ref 79.3–98.0)
MONO%: 20.9 % — ABNORMAL HIGH (ref 0.0–14.0)
NEUT%: 52.8 % (ref 39.0–75.0)
Platelets: 116 10*3/uL — ABNORMAL LOW (ref 140–400)

## 2012-01-23 MED ORDER — FLUOROURACIL CHEMO INJECTION 5 GM/100ML
2400.0000 mg/m2 | INTRAVENOUS | Status: DC
  Administered 2012-01-23: 4950 mg via INTRAVENOUS
  Filled 2012-01-23: qty 99

## 2012-01-23 MED ORDER — DEXAMETHASONE SODIUM PHOSPHATE 10 MG/ML IJ SOLN
10.0000 mg | Freq: Once | INTRAMUSCULAR | Status: AC
  Administered 2012-01-23: 10 mg via INTRAVENOUS

## 2012-01-23 MED ORDER — LEUCOVORIN CALCIUM INJECTION 350 MG
400.0000 mg/m2 | Freq: Once | INTRAVENOUS | Status: AC
  Administered 2012-01-23: 828 mg via INTRAVENOUS
  Filled 2012-01-23: qty 41.4

## 2012-01-23 MED ORDER — FLUOROURACIL CHEMO INJECTION 2.5 GM/50ML
380.0000 mg/m2 | Freq: Once | INTRAVENOUS | Status: AC
  Administered 2012-01-23: 800 mg via INTRAVENOUS
  Filled 2012-01-23: qty 16

## 2012-01-23 MED ORDER — OXALIPLATIN CHEMO INJECTION 100 MG/20ML
85.0000 mg/m2 | Freq: Once | INTRAVENOUS | Status: AC
  Administered 2012-01-23: 175 mg via INTRAVENOUS
  Filled 2012-01-23: qty 35

## 2012-01-23 MED ORDER — DEXTROSE 5 % IV SOLN
Freq: Once | INTRAVENOUS | Status: AC
  Administered 2012-01-23: 09:00:00 via INTRAVENOUS

## 2012-01-23 MED ORDER — ONDANSETRON 8 MG/50ML IVPB (CHCC)
8.0000 mg | Freq: Once | INTRAVENOUS | Status: AC
  Administered 2012-01-23: 8 mg via INTRAVENOUS

## 2012-01-25 ENCOUNTER — Ambulatory Visit (HOSPITAL_BASED_OUTPATIENT_CLINIC_OR_DEPARTMENT_OTHER): Payer: Medicare Other

## 2012-01-25 VITALS — BP 107/62 | HR 85 | Temp 98.2°F

## 2012-01-25 DIAGNOSIS — C189 Malignant neoplasm of colon, unspecified: Secondary | ICD-10-CM

## 2012-01-25 DIAGNOSIS — Z469 Encounter for fitting and adjustment of unspecified device: Secondary | ICD-10-CM

## 2012-01-25 MED ORDER — SODIUM CHLORIDE 0.9 % IJ SOLN
10.0000 mL | INTRAMUSCULAR | Status: DC | PRN
  Administered 2012-01-25: 10 mL
  Filled 2012-01-25: qty 10

## 2012-01-25 MED ORDER — HEPARIN SOD (PORK) LOCK FLUSH 100 UNIT/ML IV SOLN
500.0000 [IU] | Freq: Once | INTRAVENOUS | Status: AC | PRN
  Administered 2012-01-25: 500 [IU]
  Filled 2012-01-25: qty 5

## 2012-01-31 ENCOUNTER — Telehealth: Payer: Self-pay | Admitting: *Deleted

## 2012-01-31 NOTE — Telephone Encounter (Signed)
Message from pt's wife reporting he woke up this morning with a "hoarse voice". Returned call to pt, he denies fever/ dyspnea or cough. Instructed pt to push PO fluids, call office for dyspnea or mouth sores. He verbalized understanding. Dr. Truett Perna made aware, no new orders.

## 2012-02-05 ENCOUNTER — Other Ambulatory Visit: Payer: Self-pay | Admitting: Oncology

## 2012-02-06 ENCOUNTER — Other Ambulatory Visit (HOSPITAL_BASED_OUTPATIENT_CLINIC_OR_DEPARTMENT_OTHER): Payer: Medicare Other | Admitting: Lab

## 2012-02-06 ENCOUNTER — Ambulatory Visit (HOSPITAL_BASED_OUTPATIENT_CLINIC_OR_DEPARTMENT_OTHER): Payer: Medicare Other | Admitting: Oncology

## 2012-02-06 ENCOUNTER — Ambulatory Visit (HOSPITAL_BASED_OUTPATIENT_CLINIC_OR_DEPARTMENT_OTHER): Payer: Medicare Other

## 2012-02-06 VITALS — BP 113/68 | HR 51 | Temp 96.9°F | Ht 70.0 in | Wt 196.4 lb

## 2012-02-06 DIAGNOSIS — C787 Secondary malignant neoplasm of liver and intrahepatic bile duct: Secondary | ICD-10-CM

## 2012-02-06 DIAGNOSIS — C182 Malignant neoplasm of ascending colon: Secondary | ICD-10-CM

## 2012-02-06 DIAGNOSIS — C786 Secondary malignant neoplasm of retroperitoneum and peritoneum: Secondary | ICD-10-CM

## 2012-02-06 DIAGNOSIS — C189 Malignant neoplasm of colon, unspecified: Secondary | ICD-10-CM

## 2012-02-06 DIAGNOSIS — J069 Acute upper respiratory infection, unspecified: Secondary | ICD-10-CM

## 2012-02-06 DIAGNOSIS — Z5111 Encounter for antineoplastic chemotherapy: Secondary | ICD-10-CM

## 2012-02-06 LAB — CBC WITH DIFFERENTIAL/PLATELET
Basophils Absolute: 0 10*3/uL (ref 0.0–0.1)
EOS%: 3 % (ref 0.0–7.0)
Eosinophils Absolute: 0.2 10*3/uL (ref 0.0–0.5)
HCT: 30.4 % — ABNORMAL LOW (ref 38.4–49.9)
HGB: 10.1 g/dL — ABNORMAL LOW (ref 13.0–17.1)
LYMPH%: 13.1 % — ABNORMAL LOW (ref 14.0–49.0)
MCH: 31.3 pg (ref 27.2–33.4)
MCV: 93.9 fL (ref 79.3–98.0)
MONO%: 16.9 % — ABNORMAL HIGH (ref 0.0–14.0)
NEUT#: 4.1 10*3/uL (ref 1.5–6.5)
NEUT%: 66.8 % (ref 39.0–75.0)
Platelets: 125 10*3/uL — ABNORMAL LOW (ref 140–400)
RDW: 22 % — ABNORMAL HIGH (ref 11.0–14.6)

## 2012-02-06 LAB — COMPREHENSIVE METABOLIC PANEL
AST: 26 U/L (ref 0–37)
Albumin: 3 g/dL — ABNORMAL LOW (ref 3.5–5.2)
Alkaline Phosphatase: 148 U/L — ABNORMAL HIGH (ref 39–117)
BUN: 18 mg/dL (ref 6–23)
Creatinine, Ser: 1.21 mg/dL (ref 0.50–1.35)
Glucose, Bld: 113 mg/dL — ABNORMAL HIGH (ref 70–99)
Potassium: 4 mEq/L (ref 3.5–5.3)

## 2012-02-06 MED ORDER — DEXAMETHASONE SODIUM PHOSPHATE 10 MG/ML IJ SOLN
10.0000 mg | Freq: Once | INTRAMUSCULAR | Status: AC
  Administered 2012-02-06: 10 mg via INTRAVENOUS

## 2012-02-06 MED ORDER — ONDANSETRON 8 MG/50ML IVPB (CHCC)
8.0000 mg | Freq: Once | INTRAVENOUS | Status: AC
  Administered 2012-02-06: 8 mg via INTRAVENOUS

## 2012-02-06 MED ORDER — FLUOROURACIL CHEMO INJECTION 2.5 GM/50ML
380.0000 mg/m2 | Freq: Once | INTRAVENOUS | Status: AC
  Administered 2012-02-06: 800 mg via INTRAVENOUS
  Filled 2012-02-06: qty 16

## 2012-02-06 MED ORDER — SODIUM CHLORIDE 0.9 % IV SOLN
2400.0000 mg/m2 | INTRAVENOUS | Status: DC
  Administered 2012-02-06: 4950 mg via INTRAVENOUS
  Filled 2012-02-06: qty 99

## 2012-02-06 MED ORDER — DEXTROSE 5 % IV SOLN
85.0000 mg/m2 | Freq: Once | INTRAVENOUS | Status: AC
  Administered 2012-02-06: 175 mg via INTRAVENOUS
  Filled 2012-02-06: qty 35

## 2012-02-06 MED ORDER — DEXTROSE 5 % IV SOLN
Freq: Once | INTRAVENOUS | Status: AC
  Administered 2012-02-06: 10:00:00 via INTRAVENOUS

## 2012-02-06 MED ORDER — LEUCOVORIN CALCIUM INJECTION 350 MG
400.0000 mg/m2 | Freq: Once | INTRAVENOUS | Status: AC
  Administered 2012-02-06: 828 mg via INTRAVENOUS
  Filled 2012-02-06: qty 41.4

## 2012-02-06 NOTE — Progress Notes (Signed)
OFFICE PROGRESS NOTE   INTERVAL HISTORY:   He returns as scheduled. He completed another cycle of chemotherapy on 01/23/2012. He reports prolonged cold sensitivity following chemotherapy. He denies neuropathy symptoms today. He denies nausea, mouth sores, and diarrhea. He is working 2 days per week. He and his wife currently have a "cold "with a productive cough. No fever or shortness of breath.  Objective:  Vital signs in last 24 hours:  Blood pressure 113/68, pulse 51, temperature 96.9 F (36.1 C), height 5\' 10"  (1.778 m), weight 196 lb 6.4 oz (89.086 kg).    HEENT: No thrush or ulcers Resp: Inspiratory rhonchi at the left posterior base. No respiratory distress. Cardio: Irregular GI: No hepatosplenomegaly ,nontender Vascular: The left lower leg is larger than the right side. No erythema or edema Neuro: The vibratory sense is intact at the fingertip bilaterally    Portacath/PICC-without erythema  Lab Results:  Lab Results  Component Value Date   WBC 6.2 02/06/2012   HGB 10.1* 02/06/2012   HCT 30.4* 02/06/2012   MCV 93.9 02/06/2012   PLT 125* 02/06/2012      Medications: I have reviewed the patient's current medications.  Assessment/Plan: 1. Metastatic colon cancer, primary tumor (T3 N2b M1) at the ascending colon with metastatic omental nodules noted at the time of the right colectomy 06/08/2011. Staging PET scan on 07/11/2011 was consistent with metastatic disease involving left neck lymph nodes, abdominal/retroperitoneal lymphadenopathy, peritoneal implants and liver metastases. He began FOLFOX chemotherapy 07/18/2011. He completed cycle 6 on 09/26/2011. Restaging CT evaluation on 10/24/2011 showed improved small abdominopelvic lymph nodes and improved tiny peritoneal implants. Prior liver metastases were not well demonstrated on the recent CT scan with the radiologist commenting that the metastases were likely improved. He was also noted to have a new moderate right pleural  effusion. - -He resumed treatment with FOLFOX on 11/28/2011, he has completed 5 additional treatments to date.  2. New moderate right pleural effusion on CT scan 10/24/2011. Chest x-ray on 11/02/2011 showed small bilateral pleural effusions, left greater than right. A chest x-ray on November 29 showed improved interstitial edema and bilateral effusions. 3. Atrial fibrillation no longer taking Coumadin. 4. History of multiple episodes of gastrointestinal bleeding. 5. History of a "cancerous" polyp in 1996. 6. Status post aortic valve repair. 7. History of coronary artery disease. 8. Chronic obstructive pulmonary disease. 9. Anorexia, improved. 10. Situational depression, improved. 11. Anemia secondary to chemotherapy. 12. Fever/chills on 09/13/2011. Question if related to a urinary tract infection. There was no clinical evidence for a source of infection and blood/urine cultures remained negative. He had significant pyuria on a urinalysis. The fever resolved after treatment with ceftriaxone and ciprofloxacin. We had a low clinical suspicion for a Port-A-Cath related infection. 13. Hospitalization with pneumonia 10/29/2011 through 11/05/2011. 14. Malpositioned Port-A-Cath, status post in office repair by Dr. Abbey Chatters 11/23/2011 15. Upper respiratory infection-likely a viral URI   Disposition:  He appears stable. He will complete another cycle of FOLFOX today. He will return for an office visit and restaging evaluation in 2 weeks. He will contact us for a fever or shortness of breath. I suspect he has a viral URI today.   Lucile Shutters, MD  02/06/2012  9:31 AM

## 2012-02-07 ENCOUNTER — Telehealth: Payer: Self-pay | Admitting: *Deleted

## 2012-02-07 NOTE — Telephone Encounter (Signed)
Message from pt's wife asking if OK for pt to take Vitamin D3. Reviewed with Dr. Truett Perna: YES.

## 2012-02-08 ENCOUNTER — Ambulatory Visit (HOSPITAL_BASED_OUTPATIENT_CLINIC_OR_DEPARTMENT_OTHER): Payer: Medicare Other

## 2012-02-08 VITALS — BP 116/65 | HR 108 | Temp 97.9°F

## 2012-02-08 DIAGNOSIS — C189 Malignant neoplasm of colon, unspecified: Secondary | ICD-10-CM

## 2012-02-08 DIAGNOSIS — Z452 Encounter for adjustment and management of vascular access device: Secondary | ICD-10-CM

## 2012-02-08 MED ORDER — HEPARIN SOD (PORK) LOCK FLUSH 100 UNIT/ML IV SOLN
500.0000 [IU] | Freq: Once | INTRAVENOUS | Status: AC | PRN
  Administered 2012-02-08: 500 [IU]
  Filled 2012-02-08: qty 5

## 2012-02-08 MED ORDER — SODIUM CHLORIDE 0.9 % IJ SOLN
10.0000 mL | INTRAMUSCULAR | Status: DC | PRN
  Administered 2012-02-08: 10 mL
  Filled 2012-02-08: qty 10

## 2012-02-15 ENCOUNTER — Ambulatory Visit (HOSPITAL_COMMUNITY)
Admission: RE | Admit: 2012-02-15 | Discharge: 2012-02-15 | Disposition: A | Discharge status: Home / Self Care | Payer: Medicare Other | Source: Ambulatory Visit | Attending: Oncology | Admitting: Oncology

## 2012-02-15 DIAGNOSIS — Z9089 Acquired absence of other organs: Secondary | ICD-10-CM | POA: Insufficient documentation

## 2012-02-15 DIAGNOSIS — K7689 Other specified diseases of liver: Secondary | ICD-10-CM | POA: Insufficient documentation

## 2012-02-15 DIAGNOSIS — K5730 Diverticulosis of large intestine without perforation or abscess without bleeding: Secondary | ICD-10-CM | POA: Insufficient documentation

## 2012-02-15 DIAGNOSIS — N200 Calculus of kidney: Secondary | ICD-10-CM | POA: Insufficient documentation

## 2012-02-15 DIAGNOSIS — N281 Cyst of kidney, acquired: Secondary | ICD-10-CM | POA: Insufficient documentation

## 2012-02-15 DIAGNOSIS — C189 Malignant neoplasm of colon, unspecified: Secondary | ICD-10-CM

## 2012-02-15 DIAGNOSIS — I517 Cardiomegaly: Secondary | ICD-10-CM | POA: Insufficient documentation

## 2012-02-15 MED ORDER — IOHEXOL 300 MG/ML  SOLN
100.0000 mL | Freq: Once | INTRAMUSCULAR | Status: AC | PRN
  Administered 2012-02-15: 100 mL via INTRAVENOUS

## 2012-02-20 ENCOUNTER — Other Ambulatory Visit (HOSPITAL_BASED_OUTPATIENT_CLINIC_OR_DEPARTMENT_OTHER): Payer: Medicare Other | Admitting: Lab

## 2012-02-20 ENCOUNTER — Ambulatory Visit (HOSPITAL_BASED_OUTPATIENT_CLINIC_OR_DEPARTMENT_OTHER): Payer: Medicare Other | Admitting: Oncology

## 2012-02-20 ENCOUNTER — Telehealth: Payer: Self-pay | Admitting: Oncology

## 2012-02-20 ENCOUNTER — Ambulatory Visit: Payer: Medicare Other

## 2012-02-20 VITALS — BP 117/59 | HR 85 | Temp 96.9°F | Ht 70.0 in | Wt 189.3 lb

## 2012-02-20 DIAGNOSIS — C189 Malignant neoplasm of colon, unspecified: Secondary | ICD-10-CM

## 2012-02-20 DIAGNOSIS — C787 Secondary malignant neoplasm of liver and intrahepatic bile duct: Secondary | ICD-10-CM

## 2012-02-20 DIAGNOSIS — C770 Secondary and unspecified malignant neoplasm of lymph nodes of head, face and neck: Secondary | ICD-10-CM

## 2012-02-20 LAB — CBC WITH DIFFERENTIAL/PLATELET
BASO%: 0.3 % (ref 0.0–2.0)
EOS%: 2.8 % (ref 0.0–7.0)
LYMPH%: 12.4 % — ABNORMAL LOW (ref 14.0–49.0)
MCH: 31.8 pg (ref 27.2–33.4)
MCHC: 33.1 g/dL (ref 32.0–36.0)
MCV: 96.1 fL (ref 79.3–98.0)
MONO%: 15.1 % — ABNORMAL HIGH (ref 0.0–14.0)
NEUT%: 69.4 % (ref 39.0–75.0)
Platelets: 191 10*3/uL (ref 140–400)
RBC: 3.23 10*6/uL — ABNORMAL LOW (ref 4.20–5.82)
WBC: 5.2 10*3/uL (ref 4.0–10.3)

## 2012-02-20 LAB — COMPREHENSIVE METABOLIC PANEL
ALT: 18 U/L (ref 0–53)
Alkaline Phosphatase: 154 U/L — ABNORMAL HIGH (ref 39–117)
Creatinine, Ser: 1.3 mg/dL (ref 0.50–1.35)
Sodium: 137 mEq/L (ref 135–145)
Total Bilirubin: 0.5 mg/dL (ref 0.3–1.2)
Total Protein: 7.2 g/dL (ref 6.0–8.3)

## 2012-02-20 NOTE — Progress Notes (Signed)
OFFICE PROGRESS NOTE   INTERVAL HISTORY:   He returns as scheduled. He continues to work 2 days per week. He completed another cycle of chemotherapy on 02/06/2012. He has no new complaint.  Objective:  Vital signs in last 24 hours:  Blood pressure 117/59, pulse 85, temperature 96.9 F (36.1 C), temperature source Oral, height 5\' 10"  (1.778 m), weight 189 lb 4.8 oz (85.866 kg).    HEENT: No thrush or ulcers Lymphatics: No cervical, supraclavicular, or axillary nodes Resp: Scattered and inspiratory and expiratory wheezes. No respiratory distress Cardio: Regular rate and rhythm GI: No hepatomegaly, no mass, nontender Vascular: The left lower leg is larger than the right side.    Portacath/PICC-without erythema  Lab Results:  Lab Results  Component Value Date   WBC 5.2 02/20/2012   HGB 10.3* 02/20/2012   HCT 31.1* 02/20/2012   MCV 96.1 02/20/2012   PLT 191 02/20/2012   ANC 3.6  X-rays: CT abdomen and pelvis on 10/14/2012 was compared to a CT from 10/24/2011. Right pleural effusion has resolved. A poorly defined low-attenuation lesion is seen in the anterior segment of the right liver. This is more conspicuous than on the previous study. No other liver masses are seen. No other soft tissue masses or lymphadenopathy within the abdomen.  Medications: I have reviewed the patient's current medications.  Assessment/Plan: 1.Metastatic colon cancer, primary tumor (T3 N2b M1) at the ascending colon with metastatic omental nodules noted at the time of the right colectomy 06/08/2011. Staging PET scan on 07/11/2011 was consistent with metastatic disease involving left neck lymph nodes, abdominal/retroperitoneal lymphadenopathy, peritoneal implants and liver metastases. He began FOLFOX chemotherapy 07/18/2011. He completed cycle 6 on 09/26/2011. Restaging CT evaluation on 10/24/2011 showed improved small abdominopelvic lymph nodes and improved tiny peritoneal implants. Prior liver metastases were  not well demonstrated on the recent CT scan with the radiologist commenting that the metastases were likely improved. He was also noted to have a new moderate right pleural effusion. - -He resumed treatment with FOLFOX on 11/28/2011, he has completed 6 additional treatments to date. A restaging CT 02/15/2012 revealed a single more conspicuous a liver lesion and no other evidence of metastatic disease.  2.New moderate right pleural effusion on CT scan 10/24/2011. Resolved on the CT 02/15/2012 the 3. Chest x-ray on 11/02/2011 showed small bilateral pleural effusions, left greater than right. A chest x-ray on November 29 showed improved interstitial edema and bilateral effusions. 4. Atrial fibrillation no longer taking Coumadin.  5.History of multiple episodes of gastrointestinal bleeding. 6. History of a "cancerous" polyp in 1996.  7.Status post aortic valve repair. 8. History of coronary artery disease.  9.Chronic obstructive pulmonary disease. 10. Anorexia, improved.  11.Situational depression, improved. 12. Anemia secondary to chemotherapy. 13. Fever/chills on 09/13/2011. Question if related to a urinary tract infection. There was no clinical evidence for a source of infection and blood/urine cultures remained negative. He had significant pyuria on a urinalysis. The fever resolved after treatment with ceftriaxone and ciprofloxacin. We had a low clinical suspicion for a Port-A-Cath related infection. 14. Hospitalization with pneumonia 10/29/2011 through 11/05/2011. 15.Malpositioned Port-A-Cath, status post in office repair by Dr. Abbey Chatters 11/23/2011  Disposition:  He appears stable. He has completed another 6 cycles of FOLFOX chemotherapy. The restaging CT evaluation reveals a single liver lesion that appears increased in size compared to the CT from October of 2012. There is no other evidence of metastatic disease. Treatment options include observation, second line chemotherapy, and consideration  of an RFA  procedure to the "isolated "liver lesion. He had multiple liver metastases and evidence of carcinomatosis at presentation. I suspect there is disease outside of the liver and he would likely not benefit from hepatic directed therapy.  We decided to obtain a restaging PET scan within the next one to 2 weeks. He will return for an office visit on 03/05/2012.   Lucile Shutters, MD  02/20/2012  8:38 PM

## 2012-02-20 NOTE — Telephone Encounter (Signed)
appts made and printed for march  aom

## 2012-02-21 ENCOUNTER — Telehealth: Payer: Self-pay | Admitting: *Deleted

## 2012-02-21 NOTE — Telephone Encounter (Signed)
Returned call to wife to explain that MD wants to do PET scan 1st to be sure he has no other cancer involvement besides his liver before deciding upon the liver ablation procedure. Would not be appropriate to do if there is disease outside liver. PET scan will show if anything is hypermetabolic.

## 2012-02-27 ENCOUNTER — Encounter (HOSPITAL_COMMUNITY): Payer: Self-pay

## 2012-02-27 ENCOUNTER — Encounter (HOSPITAL_COMMUNITY)
Admission: RE | Admit: 2012-02-27 | Discharge: 2012-02-27 | Disposition: A | Discharge status: Home / Self Care | Payer: Medicare Other | Source: Ambulatory Visit | Attending: Oncology | Admitting: Oncology

## 2012-02-27 DIAGNOSIS — C787 Secondary malignant neoplasm of liver and intrahepatic bile duct: Secondary | ICD-10-CM | POA: Insufficient documentation

## 2012-02-27 DIAGNOSIS — C189 Malignant neoplasm of colon, unspecified: Secondary | ICD-10-CM | POA: Insufficient documentation

## 2012-02-27 DIAGNOSIS — R911 Solitary pulmonary nodule: Secondary | ICD-10-CM | POA: Insufficient documentation

## 2012-02-27 DIAGNOSIS — Z79899 Other long term (current) drug therapy: Secondary | ICD-10-CM | POA: Insufficient documentation

## 2012-02-27 DIAGNOSIS — R599 Enlarged lymph nodes, unspecified: Secondary | ICD-10-CM | POA: Insufficient documentation

## 2012-02-27 MED ORDER — FLUDEOXYGLUCOSE F - 18 (FDG) INJECTION
18.5000 | Freq: Once | INTRAVENOUS | Status: AC | PRN
  Administered 2012-02-27: 18.5 via INTRAVENOUS

## 2012-03-05 ENCOUNTER — Ambulatory Visit (HOSPITAL_BASED_OUTPATIENT_CLINIC_OR_DEPARTMENT_OTHER): Payer: Medicare Other | Admitting: Nurse Practitioner

## 2012-03-05 VITALS — BP 127/77 | HR 102 | Temp 97.5°F | Ht 70.0 in | Wt 188.1 lb

## 2012-03-05 DIAGNOSIS — C801 Malignant (primary) neoplasm, unspecified: Secondary | ICD-10-CM

## 2012-03-05 DIAGNOSIS — C189 Malignant neoplasm of colon, unspecified: Secondary | ICD-10-CM

## 2012-03-05 NOTE — Progress Notes (Signed)
OFFICE PROGRESS NOTE  Interval history:  Mr. Kenley is a 71 year old man with metastatic colon cancer. He completed a 12th cycle of FOLFOX chemotherapy on 02/06/2012. PET scan on 02/27/2012 showed a new hypermetabolic nodule in the right lower lobe measuring approximately 11 mm with SUV max 7.7. There were no additional hypermetabolic pulmonary nodules. There were no hypermetabolic mediastinal lymph nodes. There was a new hypermetabolic metastasis within the liver corresponding to the lesion of concern on comparison CT from 02/15/2012. The lesion measured approximately 22 mm with SUV max 18.2. Three metastases in the superior right hepatic lobe were not significantly changed. A metastasis in the inferior right hepatic lobe was no longer evident and a metastasis in the left lateral hepatic lobe was less well defined. Previous hypermetabolic bulky retroperitoneal and periportal lymphadenopathy was decreased in metabolic activity and size. There were still several intensely hypermetabolic retroperitoneal nodes.  Mr. Summer overall feels well. He denies mouth sores. No nausea or vomiting. He feels he has a good appetite. His wife does not agree. Last week he had left-sided chest pain for 3 days. He describes the pain as "like pleurisy". The pain resolved completely after 3 days. He did not require any pain medication. There was no associated shortness of breath or cough. He has had no fever.   Objective: Blood pressure 127/77, pulse 102, temperature 97.5 F (36.4 C), temperature source Oral, height 5\' 10"  (1.778 m), weight 188 lb 1.6 oz (85.322 kg).  Oropharynx is without thrush or ulceration. Lungs with scattered expiratory wheezes. No respiratory distress. Regular cardiac rhythm. Port-A-Cath site is without erythema. Abdomen soft and nontender. No hepatomegaly. Small reducible hernia at the left lower abdomen just inferior to the umbilicus. The left lower leg is larger than the right lower leg.  Lab  Results: Lab Results  Component Value Date   WBC 5.2 02/20/2012   HGB 10.3* 02/20/2012   HCT 31.1* 02/20/2012   MCV 96.1 02/20/2012   PLT 191 02/20/2012    Chemistry:    Chemistry      Component Value Date/Time   NA 137 02/20/2012 0958   K 4.3 02/20/2012 0958   CL 102 02/20/2012 0958   CO2 31 02/20/2012 0958   BUN 20 02/20/2012 0958   CREATININE 1.30 02/20/2012 0958      Component Value Date/Time   CALCIUM 9.7 02/20/2012 0958   ALKPHOS 154* 02/20/2012 0958   AST 27 02/20/2012 0958   ALT 18 02/20/2012 0958   BILITOT 0.5 02/20/2012 0958       Studies/Results: Ct Abdomen Pelvis W Contrast  02/15/2012  *RADIOLOGY REPORT*  Clinical Data: Follow-up colon carcinoma.  Restaging.  CT ABDOMEN AND PELVIS WITH CONTRAST  Technique:  Multidetector CT imaging of the abdomen and pelvis was performed following the standard protocol during bolus administration of intravenous contrast.  Contrast: OMNIPAQUE IOHEXOL 300 MG/ML IV SOLN  Comparison: 10/24/2011  Findings: Postop changes from right hemicolectomy and prior TURP are again noted.  Mild diffuse bladder wall thickening is stable. Diverticulosis is again seen involving the sigmoid colon, without evidence of diverticulitis.  No other inflammatory process or abnormal fluid collections are identified within the pelvis.  No soft tissue masses or lymphadenopathy are seen within the pelvis.  Right pleural effusion is resolved since previous study and cardiomegaly remains stable.  Surgical clips are again seen from prior cholecystectomy.  A subtle poorly defined low attenuation lesion is seen in the anterior segment the right hepatic lobe on image 19 which measures  2.0 x 1.7 cm.  This is consistent with liver metastasis and is more conspicuous than on previous study suggesting interval increase in size. No other liver masses are identified.  The spleen, pancreas, and adrenal glands are normal appearance. Several renal cysts and tiny nonobstructing calculi are seen  bilaterally but there is no evidence of renal mass or hydronephrosis.  No other soft tissue masses or lymphadenopathy identified within the abdomen.  No suspicious bone lesions are identified.  IMPRESSION:  1. Subtle 2 cm low attenuation lesion in the anterior segment of the right hepatic lobe is more conspicuous than on previous study, suggesting mild interval progression.  No other sites of metastatic disease identified. 2.  Interval resolution of right pleural effusion.  Original Report Authenticated By: Danae Orleans, M.D.   Nm Pet Image Restag (ps) Skull Base To Thigh  02/27/2012  *RADIOLOGY REPORT*  Clinical Data: Subsequent treatment strategy for colon carcinoma. Chemotherapy ongoing.  There is a new liver lesion on the recent CT scan  NUCLEAR MEDICINE PET SKULL BASE TO THIGH  Fasting Blood Glucose:  112  Technique:  18.5 mCi F-18 FDG was injected intravenously.   CT data was obtained and used for attenuation correction and anatomic localization only.  (This was not acquired as a diagnostic CT examination.) Additional exam technical data entered  on technologist worksheet.  Comparison: CT scan 02/15/2012, PET CT scan 07/11/2011  Findings:  Neck: No hypermetabolic nodes in the neck.  Chest: There is a new hypermetabolic nodule within the right lower lobe.  This is difficult to define this nodule within the pulmonary vasculature but measures roughly 11 mm (image 93) with SUV max = 7.7.  No additional hypermetabolic pulmonary nodules.  No hypermetabolic mediastinal lymph nodes.  Hypermetabolic left axillary adenopathy seen on prior PET CT has resolved.  Abdomen / Pelvis:There is a new hypermetabolic  metastasis within the liver corresponding to the lesion of concern on comparison CT from 02/15/2012.  This measures approximately 22 mm (image 124) with SUV max = 18.2.  Three metastasis in the superior right hepatic lobe are not significantly changed (image 106 to 109). Metastasis in the inferior right hepatic  lobe is no longer evident as well as in the  left lateral hepatic lobe lesion is less well defined.  The hypermetabolic bulky retroperitoneal and periportal lymphadenopathy is decreased in metabolic activity and size compared to prior.  There are still several intensely hypermetabolic retroperitoneal nodes (image 131 for example). Decrease in the hypermetabolic nodes extending into the mesentery. Small hypermetabolic aortocaval node remains (image 159) ( SUV max = 5.7).  No abnormal hypermetabolic active within the pelvis appear  Skeleton:No focal hypermetabolic activity to suggest skeletal metastasis.  IMPRESSION:  1.  Mixed response to therapy.  There is a single new hepatic metastasis while several hepatic metastasis are unchanged and others  are no longer evident. 2.  Marked decrease in hypermetabolic bulky retroperitoneal and mesenteric lymphadenopathy.  Several small hypermetabolic nodes remain in the retroperitoneum.  3.  New hypermetabolic right lower lobe pulmonary nodule. 4.  Resolution of hypermetabolic left axillary adenopathy.  Original Report Authenticated By: Genevive Bi, M.D.    Medications: I have reviewed the patient's current medications.  Assessment/Plan:  1.Metastatic colon cancer, primary tumor (T3 N2b M1) at the ascending colon with metastatic omental nodules noted at the time of the right colectomy 06/08/2011. Staging PET scan on 07/11/2011 was consistent with metastatic disease involving left neck lymph nodes, abdominal/retroperitoneal lymphadenopathy, peritoneal implants and liver metastases.  He began FOLFOX chemotherapy 07/18/2011. He completed cycle 6 on 09/26/2011. Restaging CT evaluation on 10/24/2011 showed improved small abdominopelvic lymph nodes and improved tiny peritoneal implants. Prior liver metastases were not well demonstrated on the recent CT scan with the radiologist commenting that the metastases were likely improved. He was also noted to have a new moderate  right pleural effusion. He resumed treatment with FOLFOX on 11/28/2011, he has completed 6 additional treatments to date. A restaging CT 02/15/2012 revealed a single more conspicuous a liver lesion and no other evidence of metastatic disease. Restaging PET scan on 02/27/2012 with a mixed response to therapy. There was a single new hepatic metastasis and a new right lower lobe pulmonary nodule. 2.New moderate right pleural effusion on CT scan 10/24/2011. Resolved on the CT 02/15/2012. 3. Chest x-ray on 11/02/2011 showed small bilateral pleural effusions, left greater than right. A chest x-ray on November 29 showed improved interstitial edema and bilateral effusions.  4. Atrial fibrillation no longer taking Coumadin.  5.History of multiple episodes of gastrointestinal bleeding.  6. History of a "cancerous" polyp in 1996.  7.Status post aortic valve repair.  8. History of coronary artery disease.  9.Chronic obstructive pulmonary disease.  10. Anorexia, improved.  11.Situational depression, improved.  12. Anemia secondary to chemotherapy.  13. Fever/chills on 09/13/2011. Question if related to a urinary tract infection. There was no clinical evidence for a source of infection and blood/urine cultures remained negative. He had significant pyuria on a urinalysis. The fever resolved after treatment with ceftriaxone and ciprofloxacin. We had a low clinical suspicion for a Port-A-Cath related infection.  14. Hospitalization with pneumonia 10/29/2011 through 11/05/2011.  15.Malpositioned Port-A-Cath, status post in office repair by Dr. Abbey Chatters 11/23/2011.  Disposition-Mr. Jarnigan appears stable. Dr. Truett Perna discussed a treatment break versus beginning FOLFIRI chemotherapy. It was mutually decided to begin a treatment break with plans for a restaging CT evaluation at a three-month interval. Mr. Critzer was most comfortable returning for an office visit in one month and contacting us prior to that visit  with any changes in his condition. We will schedule the CT scan when he returns for his next visit.  Plan per Dr. Truett Perna.   Lonna Cobb ANP/GNP-BC

## 2012-03-06 ENCOUNTER — Other Ambulatory Visit: Payer: Self-pay | Admitting: *Deleted

## 2012-03-06 DIAGNOSIS — R52 Pain, unspecified: Secondary | ICD-10-CM

## 2012-03-06 MED ORDER — HYDROCODONE-ACETAMINOPHEN 10-325 MG PO TABS: 1.0000 | ORAL_TABLET | Freq: Four times a day (QID) | ORAL | Status: DC | PRN

## 2012-03-07 ENCOUNTER — Other Ambulatory Visit: Payer: Self-pay | Admitting: *Deleted

## 2012-03-07 DIAGNOSIS — C182 Malignant neoplasm of ascending colon: Secondary | ICD-10-CM

## 2012-03-07 MED ORDER — SUCRALFATE 1 GM/10ML PO SUSP: 1.0000 g | Freq: Three times a day (TID) | ORAL | Status: DC

## 2012-03-10 ENCOUNTER — Other Ambulatory Visit: Payer: Self-pay | Admitting: *Deleted

## 2012-03-10 DIAGNOSIS — C189 Malignant neoplasm of colon, unspecified: Secondary | ICD-10-CM

## 2012-03-10 MED ORDER — PANTOPRAZOLE SODIUM 40 MG PO TBEC: 40.0000 mg | DELAYED_RELEASE_TABLET | Freq: Every day | ORAL | Status: AC

## 2012-03-13 ENCOUNTER — Telehealth: Payer: Self-pay | Admitting: *Deleted

## 2012-03-13 NOTE — Telephone Encounter (Signed)
Message from pt's wife reporting Bilateral rib pain, indigestion and malaise.  Returned call, pt reports intermittent "pleurisy" type pain. Denies fever, cough or dyspnea. Reviewed with Misty Stanley, NP: Pt may come in for chest XRay.  Returned call to wife, she reports Hydrocodone relieves pain, but makes pt sleep all day.  She reports pt did not want her to call office for this. She will discuss with pt to see if he will come in for [facility].

## 2012-03-14 ENCOUNTER — Other Ambulatory Visit: Payer: Self-pay | Admitting: *Deleted

## 2012-03-14 MED ORDER — FLUCONAZOLE 100 MG PO TABS: 100.0000 mg | ORAL_TABLET | Freq: Every day | ORAL | Status: DC

## 2012-03-14 NOTE — Telephone Encounter (Signed)
Message from pt's wife requesting rx for Diflucan. Pt has yeast like patches in mouth. Reviewed with Dr. Truett Perna. Rx sent electronically to pharm.

## 2012-03-17 ENCOUNTER — Telehealth: Payer: Self-pay | Admitting: *Deleted

## 2012-03-17 ENCOUNTER — Telehealth: Payer: Self-pay | Admitting: Oncology

## 2012-03-17 ENCOUNTER — Other Ambulatory Visit: Payer: Self-pay | Admitting: *Deleted

## 2012-03-17 ENCOUNTER — Ambulatory Visit (HOSPITAL_BASED_OUTPATIENT_CLINIC_OR_DEPARTMENT_OTHER): Payer: Medicare Other | Admitting: Oncology

## 2012-03-17 ENCOUNTER — Ambulatory Visit (HOSPITAL_COMMUNITY)
Admission: RE | Admit: 2012-03-17 | Discharge: 2012-03-17 | Disposition: A | Discharge status: Home / Self Care | Payer: Medicare Other | Source: Ambulatory Visit | Attending: Oncology | Admitting: Oncology

## 2012-03-17 VITALS — BP 117/72 | HR 109 | Temp 97.8°F | Ht 70.0 in | Wt 179.0 lb

## 2012-03-17 DIAGNOSIS — C189 Malignant neoplasm of colon, unspecified: Secondary | ICD-10-CM

## 2012-03-17 DIAGNOSIS — R1110 Vomiting, unspecified: Secondary | ICD-10-CM | POA: Insufficient documentation

## 2012-03-17 DIAGNOSIS — R911 Solitary pulmonary nodule: Secondary | ICD-10-CM | POA: Insufficient documentation

## 2012-03-17 DIAGNOSIS — J90 Pleural effusion, not elsewhere classified: Secondary | ICD-10-CM

## 2012-03-17 DIAGNOSIS — B370 Candidal stomatitis: Secondary | ICD-10-CM

## 2012-03-17 DIAGNOSIS — C50919 Malignant neoplasm of unspecified site of unspecified female breast: Secondary | ICD-10-CM

## 2012-03-17 DIAGNOSIS — C787 Secondary malignant neoplasm of liver and intrahepatic bile duct: Secondary | ICD-10-CM

## 2012-03-17 DIAGNOSIS — C779 Secondary and unspecified malignant neoplasm of lymph node, unspecified: Secondary | ICD-10-CM

## 2012-03-17 MED ORDER — TRAMADOL HCL 50 MG PO TABS: 50.0000 mg | ORAL_TABLET | Freq: Four times a day (QID) | ORAL | Status: AC | PRN

## 2012-03-17 MED ORDER — MIRTAZAPINE 15 MG PO TABS: 15.0000 mg | ORAL_TABLET | Freq: Every day | ORAL | Status: AC

## 2012-03-17 MED ORDER — FLUCONAZOLE 100 MG PO TABS: 100.0000 mg | ORAL_TABLET | Freq: Every day | ORAL | Status: DC

## 2012-03-17 NOTE — Telephone Encounter (Signed)
Made wife aware that abdominal film showed no obstruction--just some gas in colon. CXR was OK per MD. Continue MiraLax daily and MOM and liquid diet for next couple days. Call back on 3/28 with update and if he is doing well, can slowly advance diet.

## 2012-03-17 NOTE — Telephone Encounter (Signed)
received a note from roslind to schedule pt for appt today @ 11:45am. per roslind she will call pt with appt

## 2012-03-17 NOTE — Telephone Encounter (Signed)
sent pt to [facility] for CXR.  gv appt for april2013

## 2012-03-17 NOTE — Progress Notes (Signed)
OFFICE PROGRESS NOTE   INTERVAL HISTORY:   He returns prior to a scheduled visit. He remains off of specific therapy for colon cancer. He complains of constipation, anorexia, and increased pain at the low bilateral costal margin beginning 03/12/2012. He has continued MiraLAX and began taking milk of magnesia. He had a bowel movement earlier today. He also had an episode of emesis earlier today. He reports increased belching over the past one week.  Diflucan was prescribed for a "white coat "over the tongue on 03/14/2012. This has not helped.  He denies shortness of breath and fever. He develops somnolence after taking hydrocodone.  Objective:  Vital signs in last 24 hours:  Blood pressure 117/72, pulse 109, temperature 97.8 F (36.6 C), temperature source Oral, height 5\' 10"  (1.778 m), weight 179 lb (81.194 kg), SpO2 97.00%.    HEENT: There is a thick whitecoat over the tongue. No buccal thrush. Resp: Scattered faint and expiratory wheezes. Good air movement bilaterally. No respiratory distress. Cardio: Regular rate and rhythm GI: Hyperactive bowel sounds. No hepatomegaly, no mass, nontender, no apparent ascites Vascular: The left lower leg is slightly larger than the right side, no edema      Medications: I have reviewed the patient's current medications.  Assessment/Plan: 1.Metastatic colon cancer, primary tumor (T3 N2b M1) at the ascending colon with metastatic omental nodules noted at the time of the right colectomy 06/08/2011. Staging PET scan on 07/11/2011 was consistent with metastatic disease involving left neck lymph nodes, abdominal/retroperitoneal lymphadenopathy, peritoneal implants and liver metastases. He began FOLFOX chemotherapy 07/18/2011. He completed cycle 6 on 09/26/2011. Restaging CT evaluation on 10/24/2011 showed improved small abdominopelvic lymph nodes and improved tiny peritoneal implants. Prior liver metastases were not well demonstrated on the recent CT scan  with the radiologist commenting that the metastases were likely improved. He was also noted to have a new moderate right pleural effusion. He resumed treatment with FOLFOX on 11/28/2011, he has completed 6 additional treatments to date. A restaging CT 02/15/2012 revealed a single more conspicuous a liver lesion and no other evidence of metastatic disease. Restaging PET scan on 02/27/2012 with a mixed response to therapy. There was a single new hepatic metastasis and a new right lower lobe pulmonary nodule.  2.New moderate right pleural effusion on CT scan 10/24/2011. Resolved on the CT 02/15/2012.  3. Chest x-ray on 11/02/2011 showed small bilateral pleural effusions, left greater than right. A chest x-ray on November 29 showed improved interstitial edema and bilateral effusions.  4. Atrial fibrillation no longer taking Coumadin.  5.History of multiple episodes of gastrointestinal bleeding.  6. History of a "cancerous" polyp in 1996.  7.Status post aortic valve repair.  8. History of coronary artery disease.  9.Chronic obstructive pulmonary disease.  10. Anorexia, improved.  11.Situational depression, improved.  12. Anemia secondary to chemotherapy.  13. Fever/chills on 09/13/2011. Question if related to a urinary tract infection. There was no clinical evidence for a source of infection and blood/urine cultures remained negative. He had significant pyuria on a urinalysis. The fever resolved after treatment with ceftriaxone and ciprofloxacin. We had a low clinical suspicion for a Port-A-Cath related infection.  14. Hospitalization with pneumonia 10/29/2011 through 11/05/2011.  15.Malpositioned Port-A-Cath, status post in office repair by Dr. Abbey Chatters 11/23/2011.  16. Constipation, anorexia, anterior chest discomfort and one episode of nausea/vomiting beginning on 03/12/2012 17. Oral candidiasis-we prescribed another course of Diflucan today.  Disposition:  He presents prior to a scheduled visit  after developing constipation, anorexia, low  bilateral anterior chest discomfort, and emesis. 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 He will continue MiraLAX and begin a liquid diet. We will refer him for an abdominal x-ray series today. He will try Tramadol as needed for pain. He will return for an office visit next week as scheduled. He knows to contact us within the next few days if his symptoms are not improved.   Lucile Shutters, MD  03/17/2012  12:32 PM

## 2012-03-17 NOTE — Telephone Encounter (Signed)
Verbal order received and read back from Dr. Truett Perna for pt. To come in at 11:45am.  Orders enterred and will notify schedulers.

## 2012-03-17 NOTE — Telephone Encounter (Signed)
Wife Kyle Love called reporting pt. Is not doing well.  "Has thrush and pills are not working.  He can't eat.  Drinking about two ensure.  Keeps belching, burping.  Has not had a bm in four days.  His abdomen is tender.  Taking miralax and no results.  Would someone from Dr. Kalman Drape office please call me."  Will notify providers.

## 2012-03-20 ENCOUNTER — Ambulatory Visit (HOSPITAL_BASED_OUTPATIENT_CLINIC_OR_DEPARTMENT_OTHER): Payer: Medicare Other

## 2012-03-20 ENCOUNTER — Telehealth: Payer: Self-pay | Admitting: *Deleted

## 2012-03-20 ENCOUNTER — Other Ambulatory Visit: Payer: Self-pay | Admitting: *Deleted

## 2012-03-20 ENCOUNTER — Ambulatory Visit (HOSPITAL_COMMUNITY)
Admission: RE | Admit: 2012-03-20 | Discharge: 2012-03-20 | Disposition: A | Discharge status: Home / Self Care | Payer: Medicare Other | Source: Ambulatory Visit | Attending: Oncology | Admitting: Oncology

## 2012-03-20 ENCOUNTER — Ambulatory Visit (HOSPITAL_BASED_OUTPATIENT_CLINIC_OR_DEPARTMENT_OTHER): Payer: Medicare Other | Admitting: Nurse Practitioner

## 2012-03-20 ENCOUNTER — Telehealth: Payer: Self-pay | Admitting: Oncology

## 2012-03-20 VITALS — BP 100/67 | HR 105 | Temp 97.3°F | Ht 70.0 in | Wt 178.1 lb

## 2012-03-20 DIAGNOSIS — R109 Unspecified abdominal pain: Secondary | ICD-10-CM | POA: Insufficient documentation

## 2012-03-20 DIAGNOSIS — I4891 Unspecified atrial fibrillation: Secondary | ICD-10-CM

## 2012-03-20 DIAGNOSIS — C189 Malignant neoplasm of colon, unspecified: Secondary | ICD-10-CM

## 2012-03-20 DIAGNOSIS — R112 Nausea with vomiting, unspecified: Secondary | ICD-10-CM

## 2012-03-20 DIAGNOSIS — J90 Pleural effusion, not elsewhere classified: Secondary | ICD-10-CM

## 2012-03-20 LAB — CBC WITH DIFFERENTIAL/PLATELET
EOS%: 1.4 % (ref 0.0–7.0)
LYMPH%: 6 % — ABNORMAL LOW (ref 14.0–49.0)
MCH: 33.2 pg (ref 27.2–33.4)
MCHC: 35 g/dL (ref 32.0–36.0)
MCV: 94.7 fL (ref 79.3–98.0)
MONO%: 10.7 % (ref 0.0–14.0)
NEUT#: 7.5 10*3/uL — ABNORMAL HIGH (ref 1.5–6.5)
Platelets: 252 10*3/uL (ref 140–400)
RBC: 3.74 10*6/uL — ABNORMAL LOW (ref 4.20–5.82)
RDW: 16.6 % — ABNORMAL HIGH (ref 11.0–14.6)

## 2012-03-20 LAB — COMPREHENSIVE METABOLIC PANEL
AST: 68 U/L — ABNORMAL HIGH (ref 0–37)
Albumin: 3.2 g/dL — ABNORMAL LOW (ref 3.5–5.2)
Alkaline Phosphatase: 207 U/L — ABNORMAL HIGH (ref 39–117)
Potassium: 3.8 mEq/L (ref 3.5–5.3)
Sodium: 133 mEq/L — ABNORMAL LOW (ref 135–145)
Total Bilirubin: 0.5 mg/dL (ref 0.3–1.2)
Total Protein: 8.2 g/dL (ref 6.0–8.3)

## 2012-03-20 MED ORDER — SODIUM CHLORIDE 0.9 % IV SOLN
INTRAVENOUS | Status: DC
  Administered 2012-03-20: 13:00:00 via INTRAVENOUS

## 2012-03-20 NOTE — Telephone Encounter (Signed)
PT IS VERY WEAK. HE IS UNABLE TO EAT BUT HAS HAD SOME GADORADE AND ENSURE. PT. IS NAUSEATED AND HAS VOMITED A LARGE AMOUNT THIS MORNING. HE IS ALSO HAVING A LOT OF GAS. LAST LARGE BOWEL MOVEMENT WAS Monday,03/17/12. VERBAL ORDER AND READ BACK TO DR.SHERRILL-PT. TO HAVE A PLAIN FILM OF AN ACUTE ABDOMEN AND SEE LISA THOMAS,NP AT 12:30PM. NOTIFIED PT'S WIFE. SHE VOICES UNDERSTANDING.

## 2012-03-20 NOTE — Telephone Encounter (Signed)
Called pt's wife: Bring pt to [facility] to be worked in for [employer] then come in to the office. She verbalized understanding.

## 2012-03-20 NOTE — Telephone Encounter (Signed)
gv appts for 3/29 including ct to LT she will gv appts and prep to pt.

## 2012-03-20 NOTE — Telephone Encounter (Signed)
PT.'S DAUGHTER CALLED CONCERNED ABOUT HER FATHER'S CONDITION. SHE WOULD LIKE HER FATHER ADMITTED TO THE HOSPITAL. EXPLAINED TO PT.'S DAUGHTER THAT THIS NURSE HAS SPOKEN WITH HER MOTHER. HER FATHER IS TO HAVE A XRAY AND BE SEEN IN THIS OFFICE TODAY. THIS NURSE WILL NOTIFY DR.SHERRILL OF PT.'S DAUGHTER'S REQUEST.

## 2012-03-20 NOTE — Progress Notes (Signed)
OFFICE PROGRESS NOTE  Interval history:  Kyle Love is a 71 year old man with metastatic colon cancer. He is seen today for an unscheduled visit due to abdominal pain and vomiting. He is having intermittent pain at the left upper abdomen. Tramadol eases the pain. His last bowel movement was 03/17/2012. He continues MiraLAX and stool softeners. He vomited 1 time this morning. He did not vomit yesterday. He notes increased left-sided upper abdominal pain approximately 10 minutes after eating or drinking anything that is larger volume than a can of Ensure. He denies fever. No shortness of breath or cough. He notes that his urine stream is "weak". No dysuria.   Objective: Blood pressure 109/71, pulse 108, temperature 97.3 F (36.3 C), temperature source Oral, height 5\' 10"  (1.778 m), weight 178 lb 1.6 oz (80.786 kg).  Blood pressure lying 125/82, heart rate 106; blood pressure sitting 109/71, heart rate 108; blood pressure standing 91/61, heart rate 109.  White coating over tongue. Mucous membranes appear somewhat dry. Lungs are clear. Regular cardiac rhythm. Port-A-Cath site is without erythema. Abdomen is soft, nontender; nondistended. Bowel sounds are active. No mass. No organomegaly. No leg edema. Skin turgor is decreased.  Lab Results: Lab Results  Component Value Date   WBC 9.1 03/20/2012   HGB 12.4* 03/20/2012   HCT 35.4* 03/20/2012   MCV 94.7 03/20/2012   PLT 252 03/20/2012    Chemistry:    Chemistry      Component Value Date/Time   NA 133* 03/20/2012 1256   K 3.8 03/20/2012 1256   CL 96 03/20/2012 1256   CO2 29 03/20/2012 1256   BUN 27* 03/20/2012 1256   CREATININE 1.17 03/20/2012 1256      Component Value Date/Time   CALCIUM 9.7 03/20/2012 1256   ALKPHOS 207* 03/20/2012 1256   AST 68* 03/20/2012 1256   ALT 87* 03/20/2012 1256   BILITOT 0.5 03/20/2012 1256       Studies/Results: Dg Chest 2 View  03/17/2012  *RADIOLOGY REPORT*  Clinical Data: History of metastatic colon carcinoma.   CHEST - 2 VIEW  Comparison: PET CT 02/27/2012.  Findings: Tip of right internal jugular power Port-A-Cath is in the superior vena cava.  This cardiac silhouette is upper normal size. Ectasia and nonaneurysmal calcification of the thoracic aorta are seen.  Previous median sternotomy has been performed.  No pulmonary edema or pneumonia is evident.  On the previous PET CT a new hyper- metabolic nodular density was present within the medial aspect of the right lower lobe.  On the current chest examination on the PA image there is slight rounded nodularity projecting over the descending pulmonary artery in this area but a discrete measurable mass is not visualized.  No pleural effusion is seen.  There is osteopenic appearance of bones.  There is degenerative spondylosis. There is stable slight decreased height of the T11 vertebral body.  IMPRESSION: Right internal jugular power Port-A-Cath is in place.  On the previous PET CT a new hypermetabolic nodular density was present within the medial aspect of the right lower lobe.  On the current chest examination on the PA image there is slight rounded nodularity projecting over the descending pulmonary artery in this area but a discrete measurable mass is not visualized.  Original Report Authenticated By: Crawford Givens, M.D.   Nm Pet Image Restag (ps) Skull Base To Thigh  02/27/2012  *RADIOLOGY REPORT*  Clinical Data: Subsequent treatment strategy for colon carcinoma. Chemotherapy ongoing.  There is a new liver lesion on  the recent CT scan  NUCLEAR MEDICINE PET SKULL BASE TO THIGH  Fasting Blood Glucose:  112  Technique:  18.5 mCi F-18 FDG was injected intravenously.   CT data was obtained and used for attenuation correction and anatomic localization only.  (This was not acquired as a diagnostic CT examination.) Additional exam technical data entered  on technologist worksheet.  Comparison: CT scan 02/15/2012, PET CT scan 07/11/2011  Findings:  Neck: No hypermetabolic nodes in  the neck.  Chest: There is a new hypermetabolic nodule within the right lower lobe.  This is difficult to define this nodule within the pulmonary vasculature but measures roughly 11 mm (image 93) with SUV max = 7.7.  No additional hypermetabolic pulmonary nodules.  No hypermetabolic mediastinal lymph nodes.  Hypermetabolic left axillary adenopathy seen on prior PET CT has resolved.  Abdomen / Pelvis:There is a new hypermetabolic  metastasis within the liver corresponding to the lesion of concern on comparison CT from 02/15/2012.  This measures approximately 22 mm (image 124) with SUV max = 18.2.  Three metastasis in the superior right hepatic lobe are not significantly changed (image 106 to 109). Metastasis in the inferior right hepatic lobe is no longer evident as well as in the  left lateral hepatic lobe lesion is less well defined.  The hypermetabolic bulky retroperitoneal and periportal lymphadenopathy is decreased in metabolic activity and size compared to prior.  There are still several intensely hypermetabolic retroperitoneal nodes (image 131 for example). Decrease in the hypermetabolic nodes extending into the mesentery. Small hypermetabolic aortocaval node remains (image 159) ( SUV max = 5.7).  No abnormal hypermetabolic active within the pelvis appear  Skeleton:No focal hypermetabolic activity to suggest skeletal metastasis.  IMPRESSION:  1.  Mixed response to therapy.  There is a single new hepatic metastasis while several hepatic metastasis are unchanged and others  are no longer evident. 2.  Marked decrease in hypermetabolic bulky retroperitoneal and mesenteric lymphadenopathy.  Several small hypermetabolic nodes remain in the retroperitoneum.  3.  New hypermetabolic right lower lobe pulmonary nodule. 4.  Resolution of hypermetabolic left axillary adenopathy.  Original Report Authenticated By: Genevive Bi, M.D.   Dg Abd 2 Views  03/20/2012  *RADIOLOGY REPORT*  Clinical Data: Left side abdominal  pain with eating.  No bowel movement for 4 days.  ABDOMEN - 2 VIEW  Comparison: Two views abdomen 03/17/2012 and CT abdomen and pelvis 02/15/2012.  Findings: No free intraperitoneal air is identified.  The bowel gas pattern is unremarkable.  Mild gaseous distention of the colon seen on prior study has decreased.  No evidence of small bowel obstruction.  Cholecystectomy clips noted.  IMPRESSION: Negative exam.  Original Report Authenticated By: Bernadene Bell. Maricela Curet, M.D.   Dg Abd 2 Views  03/17/2012  *RADIOLOGY REPORT*  Clinical Data: History of colon carcinoma.  History of vomiting.  ABDOMEN - 2 VIEW  Comparison: CT 02/15/2012.  Findings: Previous median sternotomy has been performed. Cholecystectomy clips are seen.  There is no evidence of pneumoperitoneum.  Pelvic phleboliths are seen.  There is slight scoliosis convexity to the left.  There is degenerative spondylosis.  Bowel gas pattern is nonspecific.  There is mild gaseous distention of portions of the colon.  Small bowel pattern is within normal limits.  No opaque calculi are seen.  IMPRESSION: No evidence of pneumoperitoneum.  No evidence of bowel obstruction.  Original Report Authenticated By: Crawford Givens, M.D.    Medications: I have reviewed the patient's current medications.  Assessment/Plan:  1.Metastatic colon cancer, primary tumor (T3 N2b M1) at the ascending colon with metastatic omental nodules noted at the time of the right colectomy 06/08/2011. Staging PET scan on 07/11/2011 was consistent with metastatic disease involving left neck lymph nodes, abdominal/retroperitoneal lymphadenopathy, peritoneal implants and liver metastases. He began FOLFOX chemotherapy 07/18/2011. He completed cycle 6 on 09/26/2011. Restaging CT evaluation on 10/24/2011 showed improved small abdominopelvic lymph nodes and improved tiny peritoneal implants. Prior liver metastases were not well demonstrated on the recent CT scan with the radiologist commenting that the  metastases were likely improved. He was also noted to have a new moderate right pleural effusion. He resumed treatment with FOLFOX on 11/28/2011, he has completed 6 additional treatments to date. A restaging CT 02/15/2012 revealed a single more conspicuous a liver lesion and no other evidence of metastatic disease. Restaging PET scan on 02/27/2012 with a mixed response to therapy. There was a single new hepatic metastasis and a new right lower lobe pulmonary nodule.  2.New moderate right pleural effusion on CT scan 10/24/2011. Resolved on the CT 02/15/2012.  3. Chest x-ray on 11/02/2011 showed small bilateral pleural effusions, left greater than right. A chest x-ray on November 29 showed improved interstitial edema and bilateral effusions.  4. Atrial fibrillation no longer taking Coumadin.  5.History of multiple episodes of gastrointestinal bleeding.  6. History of a "cancerous" polyp in 1996.  7.Status post aortic valve repair.  8. History of coronary artery disease.  9.Chronic obstructive pulmonary disease.  10. Anorexia, improved.  11.Situational depression, improved.  12. Anemia secondary to chemotherapy.  13. Fever/chills on 09/13/2011. Question if related to a urinary tract infection. There was no clinical evidence for a source of infection and blood/urine cultures remained negative. He had significant pyuria on a urinalysis. The fever resolved after treatment with ceftriaxone and ciprofloxacin. We had a low clinical suspicion for a Port-A-Cath related infection.  14. Hospitalization with pneumonia 10/29/2011 through 11/05/2011.  15.Malpositioned Port-A-Cath, status post in office repair by Dr. Abbey Chatters 11/23/2011.  16. Constipation, anorexia, anterior chest discomfort and one episode of nausea/vomiting beginning on 03/12/2012. Plain x-ray of the abdomen 03/17/2012 negative for evidence of a bowel obstruction. 17. Oral candidiasis-he is completing a course of Diflucan. 18. Pain left upper  abdomen, one episode of nausea/vomiting. 19. Dehydration. 19. Orthostatic hypotension.  Disposition-the etiology of the abdominal pain and nausea/vomiting is unclear. Abdominal x-ray today is negative for obstruction. He appears to be dehydrated. We will administer a liter of IV fluids in the office today. He will return for additional IV fluids on 03/21/2012 to be followed by a CT scan of the abdomen. We have instructed Kyle Love to hold Lasix and to decrease the Lopressor dose to 100 mg daily. We will reevaluate following the CT scan on 03/21/2012. He will contact the office in the interim with any problems.  Patient seen with Dr. Truett Perna.    Lonna Cobb ANP/GNP-BC

## 2012-03-21 ENCOUNTER — Other Ambulatory Visit: Payer: Self-pay | Admitting: *Deleted

## 2012-03-21 ENCOUNTER — Ambulatory Visit (HOSPITAL_BASED_OUTPATIENT_CLINIC_OR_DEPARTMENT_OTHER): Payer: Medicare Other

## 2012-03-21 ENCOUNTER — Ambulatory Visit (HOSPITAL_COMMUNITY)
Admission: RE | Admit: 2012-03-21 | Discharge: 2012-03-21 | Disposition: A | Discharge status: Home / Self Care | Payer: Medicare Other | Source: Ambulatory Visit | Attending: Nurse Practitioner | Admitting: Nurse Practitioner

## 2012-03-21 DIAGNOSIS — C787 Secondary malignant neoplasm of liver and intrahepatic bile duct: Secondary | ICD-10-CM | POA: Insufficient documentation

## 2012-03-21 DIAGNOSIS — C189 Malignant neoplasm of colon, unspecified: Secondary | ICD-10-CM

## 2012-03-21 DIAGNOSIS — E860 Dehydration: Secondary | ICD-10-CM

## 2012-03-21 DIAGNOSIS — R599 Enlarged lymph nodes, unspecified: Secondary | ICD-10-CM | POA: Insufficient documentation

## 2012-03-21 DIAGNOSIS — E279 Disorder of adrenal gland, unspecified: Secondary | ICD-10-CM | POA: Insufficient documentation

## 2012-03-21 MED ORDER — IOHEXOL 300 MG/ML  SOLN
100.0000 mL | Freq: Once | INTRAMUSCULAR | Status: AC | PRN
  Administered 2012-03-21: 80 mL via INTRAVENOUS

## 2012-03-21 MED ORDER — SODIUM CHLORIDE 0.9 % IV SOLN
INTRAVENOUS | Status: DC
  Administered 2012-03-21: 10:00:00 via INTRAVENOUS

## 2012-03-22 ENCOUNTER — Ambulatory Visit: Payer: Medicare Other

## 2012-03-22 ENCOUNTER — Inpatient Hospital Stay (HOSPITAL_COMMUNITY)
Admission: EM | Admit: 2012-03-22 | Discharge: 2012-04-08 | DRG: 326 | Disposition: A | Discharge status: Home / Self Care | Payer: Medicare Other | Attending: Internal Medicine | Admitting: Internal Medicine

## 2012-03-22 ENCOUNTER — Encounter (HOSPITAL_COMMUNITY): Payer: Self-pay

## 2012-03-22 DIAGNOSIS — K3184 Gastroparesis: Secondary | ICD-10-CM | POA: Diagnosis not present

## 2012-03-22 DIAGNOSIS — Z951 Presence of aortocoronary bypass graft: Secondary | ICD-10-CM

## 2012-03-22 DIAGNOSIS — K432 Incisional hernia without obstruction or gangrene: Secondary | ICD-10-CM | POA: Diagnosis present

## 2012-03-22 DIAGNOSIS — C787 Secondary malignant neoplasm of liver and intrahepatic bile duct: Secondary | ICD-10-CM | POA: Diagnosis present

## 2012-03-22 DIAGNOSIS — I5032 Chronic diastolic (congestive) heart failure: Secondary | ICD-10-CM | POA: Diagnosis present

## 2012-03-22 DIAGNOSIS — Z91038 Other insect allergy status: Secondary | ICD-10-CM

## 2012-03-22 DIAGNOSIS — D509 Iron deficiency anemia, unspecified: Secondary | ICD-10-CM

## 2012-03-22 DIAGNOSIS — C189 Malignant neoplasm of colon, unspecified: Secondary | ICD-10-CM

## 2012-03-22 DIAGNOSIS — Z87442 Personal history of urinary calculi: Secondary | ICD-10-CM

## 2012-03-22 DIAGNOSIS — J4489 Other specified chronic obstructive pulmonary disease: Secondary | ICD-10-CM

## 2012-03-22 DIAGNOSIS — R112 Nausea with vomiting, unspecified: Secondary | ICD-10-CM

## 2012-03-22 DIAGNOSIS — I509 Heart failure, unspecified: Secondary | ICD-10-CM | POA: Diagnosis present

## 2012-03-22 DIAGNOSIS — E876 Hypokalemia: Secondary | ICD-10-CM

## 2012-03-22 DIAGNOSIS — D72829 Elevated white blood cell count, unspecified: Secondary | ICD-10-CM

## 2012-03-22 DIAGNOSIS — N390 Urinary tract infection, site not specified: Secondary | ICD-10-CM

## 2012-03-22 DIAGNOSIS — E871 Hypo-osmolality and hyponatremia: Secondary | ICD-10-CM

## 2012-03-22 DIAGNOSIS — R5381 Other malaise: Secondary | ICD-10-CM

## 2012-03-22 DIAGNOSIS — K315 Obstruction of duodenum: Secondary | ICD-10-CM

## 2012-03-22 DIAGNOSIS — K56609 Unspecified intestinal obstruction, unspecified as to partial versus complete obstruction: Secondary | ICD-10-CM

## 2012-03-22 DIAGNOSIS — R7401 Elevation of levels of liver transaminase levels: Secondary | ICD-10-CM

## 2012-03-22 DIAGNOSIS — I4892 Unspecified atrial flutter: Secondary | ICD-10-CM | POA: Diagnosis not present

## 2012-03-22 DIAGNOSIS — Z452 Encounter for adjustment and management of vascular access device: Secondary | ICD-10-CM

## 2012-03-22 DIAGNOSIS — R109 Unspecified abdominal pain: Secondary | ICD-10-CM

## 2012-03-22 DIAGNOSIS — R634 Abnormal weight loss: Secondary | ICD-10-CM | POA: Diagnosis present

## 2012-03-22 DIAGNOSIS — I4891 Unspecified atrial fibrillation: Secondary | ICD-10-CM

## 2012-03-22 DIAGNOSIS — I25810 Atherosclerosis of coronary artery bypass graft(s) without angina pectoris: Secondary | ICD-10-CM

## 2012-03-22 DIAGNOSIS — Z8719 Personal history of other diseases of the digestive system: Secondary | ICD-10-CM

## 2012-03-22 DIAGNOSIS — J449 Chronic obstructive pulmonary disease, unspecified: Secondary | ICD-10-CM

## 2012-03-22 DIAGNOSIS — Z9221 Personal history of antineoplastic chemotherapy: Secondary | ICD-10-CM

## 2012-03-22 DIAGNOSIS — R1012 Left upper quadrant pain: Secondary | ICD-10-CM | POA: Diagnosis present

## 2012-03-22 DIAGNOSIS — R609 Edema, unspecified: Secondary | ICD-10-CM | POA: Diagnosis present

## 2012-03-22 DIAGNOSIS — K5669 Other intestinal obstruction: Principal | ICD-10-CM | POA: Diagnosis present

## 2012-03-22 DIAGNOSIS — Z6828 Body mass index (BMI) 28.0-28.9, adult: Secondary | ICD-10-CM

## 2012-03-22 DIAGNOSIS — R52 Pain, unspecified: Secondary | ICD-10-CM

## 2012-03-22 DIAGNOSIS — R000 Tachycardia, unspecified: Secondary | ICD-10-CM | POA: Diagnosis not present

## 2012-03-22 DIAGNOSIS — I2510 Atherosclerotic heart disease of native coronary artery without angina pectoris: Secondary | ICD-10-CM | POA: Diagnosis present

## 2012-03-22 DIAGNOSIS — C772 Secondary and unspecified malignant neoplasm of intra-abdominal lymph nodes: Secondary | ICD-10-CM | POA: Diagnosis present

## 2012-03-22 DIAGNOSIS — Z952 Presence of prosthetic heart valve: Secondary | ICD-10-CM

## 2012-03-22 DIAGNOSIS — R0602 Shortness of breath: Secondary | ICD-10-CM

## 2012-03-22 DIAGNOSIS — R7402 Elevation of levels of lactic acid dehydrogenase (LDH): Secondary | ICD-10-CM | POA: Diagnosis present

## 2012-03-22 DIAGNOSIS — R531 Weakness: Secondary | ICD-10-CM

## 2012-03-22 DIAGNOSIS — K560 Paralytic ileus: Secondary | ICD-10-CM | POA: Diagnosis not present

## 2012-03-22 DIAGNOSIS — Z954 Presence of other heart-valve replacement: Secondary | ICD-10-CM

## 2012-03-22 DIAGNOSIS — E43 Unspecified severe protein-calorie malnutrition: Secondary | ICD-10-CM | POA: Diagnosis present

## 2012-03-22 DIAGNOSIS — I428 Other cardiomyopathies: Secondary | ICD-10-CM | POA: Diagnosis present

## 2012-03-22 DIAGNOSIS — J189 Pneumonia, unspecified organism: Secondary | ICD-10-CM

## 2012-03-22 DIAGNOSIS — K529 Noninfective gastroenteritis and colitis, unspecified: Secondary | ICD-10-CM

## 2012-03-22 LAB — CBC
MCH: 33 pg (ref 26.0–34.0)
MCV: 98.3 fL (ref 78.0–100.0)
Platelets: 210 10*3/uL (ref 150–400)
RDW: 16.4 % — ABNORMAL HIGH (ref 11.5–15.5)
WBC: 7 10*3/uL (ref 4.0–10.5)

## 2012-03-22 MED ORDER — ONDANSETRON HCL 4 MG PO TABS: 4.0000 mg | ORAL_TABLET | Freq: Four times a day (QID) | ORAL | Status: DC | PRN

## 2012-03-22 MED ORDER — CLINIMIX E/DEXTROSE (5/15) 5 % IV SOLN
INTRAVENOUS | Status: DC
  Administered 2012-03-22: 19:00:00 via INTRAVENOUS
  Filled 2012-03-22: qty 1000

## 2012-03-22 MED ORDER — FAT EMULSION 20 % IV EMUL
250.0000 mL | INTRAVENOUS | Status: DC
  Administered 2012-03-22: 250 mL via INTRAVENOUS
  Filled 2012-03-22: qty 250

## 2012-03-22 MED ORDER — SODIUM CHLORIDE 0.9 % IV SOLN
INTRAVENOUS | Status: DC
  Administered 2012-03-22: 15:00:00 via INTRAVENOUS
  Administered 2012-03-23: 100 mL/h via INTRAVENOUS
  Administered 2012-03-23: 01:00:00 via INTRAVENOUS

## 2012-03-22 MED ORDER — SODIUM CHLORIDE 0.9 % IV BOLUS (SEPSIS)
1000.0000 mL | Freq: Once | INTRAVENOUS | Status: AC
  Administered 2012-03-22: 1000 mL via INTRAVENOUS

## 2012-03-22 MED ORDER — MORPHINE SULFATE 2 MG/ML IJ SOLN
1.0000 mg | INTRAMUSCULAR | Status: DC | PRN
  Administered 2012-03-22 (×3): 1 mg via INTRAVENOUS
  Filled 2012-03-22 (×3): qty 1

## 2012-03-22 MED ORDER — ONDANSETRON HCL 4 MG/2ML IJ SOLN: 4.0000 mg | Freq: Four times a day (QID) | INTRAMUSCULAR | Status: DC | PRN

## 2012-03-22 MED ORDER — ENOXAPARIN SODIUM 40 MG/0.4ML ~~LOC~~ SOLN
40.0000 mg | SUBCUTANEOUS | Status: DC
  Filled 2012-03-22 (×2): qty 0.4

## 2012-03-22 NOTE — Consult Note (Signed)
#   [id number] is th consult note.  I saw pt.  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  I clearly feel that GI consultation is indicated for EGD and possible stent placement if they can reach the area of stenosis.  If GI cannot get to this area, I would then consider getting Dr. Abbey Chatters on board for the possibility of an open procedure and by-pass the blockage.  I will start TNA as I think this will help.  I did not discuss code status with the pt. I believe Dr. Truett Perna should be the MD to do this.  He is very nice.  His family is also is very nice.  I will pray for him!!  He has strong faith.   Isaiah 41:10  Kyle E.

## 2012-03-22 NOTE — Progress Notes (Signed)
PARENTERAL NUTRITION CONSULT NOTE - INITIAL  Pharmacy Consult for TNA Indication: Malignant bowel obstruction  Allergies  Allergen Reactions  . Diltiazem Hcl     REACTION: Severe lower extremity edema on generic cardizem  . Penicillins     Pt can tolerate cephalosporins/Pt tol Zosyn 11/01/11  . Plasma Human     Patient Measurements: Height: 5\' 10"  (177.8 cm) Weight: 174 lb (78.926 kg) IBW/kg (Calculated) : 73    Vital Signs: Temp: 98 F (36.7 C) (03/30 1255) Temp src: Oral (03/30 1255) BP: 110/69 mmHg (03/30 1255) Pulse Rate: 104  (03/30 1255) Intake/Output from previous day:   Intake/Output from this shift:    Labs:  [facility] 03/20/12 1256  WBC 9.1  HGB 12.4*  HCT 35.4*  PLT 252  APTT --  INR --     Basename 03/20/12 1256  NA 133*  K 3.8  CL 96  CO2 29  GLUCOSE 132*  BUN 27*  CREATININE 1.17  LABCREA --  CREAT24HRUR --  CALCIUM 9.7  MG --  PHOS --  PROT 8.2  ALBUMIN 3.2*  AST 68*  ALT 87*  ALKPHOS 207*  BILITOT 0.5  BILIDIR --  IBILI --  PREALBUMIN --  TRIG --  CHOLHDL --  CHOL --   Estimated Creatinine Clearance: 58.9 ml/min (by C-G formula based on Cr of 1.17).    Medical History: Past Medical History  Diagnosis Date  . History of ETOH abuse     quit 25 yrs ago, heavy etoh 5 yrs  . Colitis   . History of GI diverticular bleed 3/11  . Rectus sheath hematoma 3/10  . Status post Maze operation for atrial fibrillation   . Aortic aneurysm   . 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 S/P colonoscopy 05/03/10    mod diverticulosis sigmoid colon, internal hemorrhoids, suspected diverticular bleed  . Diverticulosis 07/23/2008    colonoscopy by Dr Jena Gauss, hyperplastic polyp  . IDA (iron deficiency anemia)     work-up including small bowel capsule study benign  . CHF (congestive heart failure)     diastolic  . COPD (chronic obstructive pulmonary disease)   . CAD  (coronary artery disease)     s/p CABG  . S/P aortic valve replacement   . Cancer right colon [id number]  . Weight decrease     20-30 lb in month  . Pneumonia   . History of colon polyps   . Shortness of breath   . Arthritis     bilateral neck and shoulder pain  . Atrial fibrillation     Medications:  Xanax Aspirin Cyanocobalamin Colace Folic acid Norco Metoprolol Protonix Remeron Miralax Compazine Tramadol Zofran   Assessment:  70 YOM with metastatic colon cancer, presents with nausea and abdominal pain, concerning for malignant bowel obstruction  Patient reports ~13lb weight loss over the past 2 weeks and is unable to eat or drink anything without significant pain  Nutritional Goals:  RD assessment ordered  Plan:   Begin Clinimix E5/15 at 71ml/hr  TNA lab panel in am  Follow up RD recommendations for goal protein/kcals   Loralee Pacas, PharmD, BCPS Pager: [phone] 03/22/2012,1:51 PM

## 2012-03-22 NOTE — Progress Notes (Signed)
Pt request clear liquids contacted dr Ardyth Harps she advised to remain NPO until GI has seen patient in case a procedure is needed... Pt to remain NPO with ice chips until GI has consulted. If GI is not going to do a procedure pt may have clear liquids until midnight contact physician for order. Annitta Needs, RN

## 2012-03-22 NOTE — ED Notes (Signed)
Patient states that he was seen by his MD yesterday and found a blockage on a CT scan. Patient denies any pain or nausea currently.

## 2012-03-22 NOTE — ED Provider Notes (Signed)
History     CSN: [id number]  Arrival date & time 03/22/12  4098   First MD Initiated Contact with Patient 03/22/12 1000      No chief complaint on file.   (Consider location/radiation/quality/duration/timing/severity/associated sxs/prior treatment) Patient is a 71 y.o. male presenting with weakness. The history is provided by the patient, the spouse and a relative.  Weakness The primary symptoms include nausea. Primary symptoms do not include fever or vomiting. Episode onset: Symptoms progressive over the last 2 weeks. Reports a 13 pund weight loss in same time frame. The symptoms are worsening. Context: The patient has a history of colon cancer with resection and chemotherapy, last chemo one month prior.   Additional symptoms include weakness and pain. 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 He continues to be unable to eat or drink anything without significant pain. . Medical issues also include cancer. Workup history includes CT scan.    Past Medical History  Diagnosis Date  . History of ETOH abuse     quit 25 yrs ago, heavy etoh 5 yrs  . Colitis   . History of GI diverticular bleed 3/11  . Rectus sheath hematoma 3/10  . Status post Maze operation for atrial fibrillation   . Aortic aneurysm   . 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 S/P colonoscopy 05/03/10    mod diverticulosis sigmoid colon, internal hemorrhoids, suspected diverticular bleed  . Diverticulosis 07/23/2008    colonoscopy by Dr Jena Gauss, hyperplastic polyp  . IDA (iron deficiency anemia)     work-up including small bowel capsule study benign  . CHF (congestive heart failure)     diastolic  . COPD (chronic obstructive pulmonary disease)   . CAD (coronary artery disease)     s/p CABG  . S/P aortic valve replacement   . Cancer right colon T3N2M1  .  Weight decrease     20-30 lb in month  . Pneumonia   . History of colon polyps   . Shortness of breath   . Arthritis     bilateral neck and shoulder pain  . Atrial fibrillation     Past Surgical History  Procedure Date  . Maze   . Aortic valve replacement 04/16/2007  . Hemorrhoid surgery 1997  . Esophagogastroduodenoscopy 05/14/11    Dr Elnoria Howard reportedly normal  . Tonsillectomy   . Colonoscopy 05/17/11, 07/23/08, 07/05/06, 10/10/05    at [facility]: diverticlosis,internal hemorrhiods  . Portacath placement 07/16/2011  . Colon surgery 06/09/11    LAP. RIGHT COLECTOMY  . Cholecystectomy 06/09/11  . Cardiac catheterization 04/03/2007    Family History  Problem Relation Age of Onset  . Lung cancer Mother   . Cancer Mother     lung  . Coronary artery disease Father   . Heart attack Father   . Lung cancer Sister   . Cancer Sister     lung  . Pancreatic cancer Brother   . Cancer Brother     pancreatic    History  Substance Use Topics  . Smoking status: Former Smoker -- 1.5 packs/day for 56 years    Types: Cigarettes    Quit date: 08/25/2011  . Smokeless tobacco: Not on file   Comment: recently restarted  . Alcohol Use: No     history etoh abuse, quit 25 yrs ago      Review of  Systems  Constitutional: Positive for unexpected weight change. Negative for fever and chills.  HENT: Negative.   Respiratory: Negative.   Cardiovascular: Negative.   Gastrointestinal: Positive for nausea and abdominal pain. Negative for vomiting.  Musculoskeletal: Negative.   Skin: Negative.   Neurological: Positive for weakness.    Allergies  Diltiazem hcl; Penicillins; and Plasma human  Home Medications   Current Outpatient Rx  Name Route Sig Dispense Refill  . ALPRAZOLAM 0.5 MG PO TABS  TAKE 1/2 TO ONE TAB BY MOUTH EVERY 12 HOURS PRN ANXIETY. MAY ALSO USE FOR SLEEP PRN. 30 tablet 0  . ASPIRIN 81 MG PO TABS Oral Take 81 mg by mouth daily.      Marland Kitchen VITAMIN B 12 PO Oral Take 100 mg by mouth  daily.     Marland Kitchen DOCUSATE SODIUM 100 MG PO CAPS Oral Take 100 mg by mouth 2 (two) times daily.      Marland Kitchen FOLIC ACID 400 MCG PO TABS Oral Take 400 mcg by mouth daily.      Marland Kitchen HYDROCODONE-ACETAMINOPHEN 10-325 MG PO TABS Oral Take 1 tablet by mouth every 6 (six) hours as needed for pain. 75 tablet 0  . LIDOCAINE-PRILOCAINE 2.5-2.5 % EX CREA  Ad lib.    Marland Kitchen METOPROLOL TARTRATE 50 MG PO TABS Oral Take 50 mg by mouth 2 (two) times daily.    Marland Kitchen MIRTAZAPINE 15 MG PO TABS Oral Take 1 tablet (15 mg total) by mouth at bedtime. 30 tablet 5  . ONDANSETRON HCL 8 MG PO TABS Oral Take by mouth every 8 (eight) hours as needed. For nausea    . PANTOPRAZOLE SODIUM 40 MG PO TBEC Oral Take 1 tablet (40 mg total) by mouth daily. 30 tablet 2  . POLYETHYLENE GLYCOL 3350 PO PACK Oral Take 17 g by mouth at bedtime.    Marland Kitchen PROCHLORPERAZINE MALEATE 10 MG PO TABS Oral Take 10 mg by mouth every 6 (six) hours as needed. Nausea     . TRAMADOL HCL 50 MG PO TABS Oral Take 1 tablet (50 mg total) by mouth every 6 (six) hours as needed for pain. 100 tablet 0  . MIRTAZAPINE 15 MG PO TABS Oral Take 1 tablet (15 mg total) by mouth at bedtime. 30 tablet 2    BP 133/79  Pulse 105  Temp(Src) 97.6 F (36.4 C) (Oral)  Resp 19  SpO2 99%  Physical Exam  Constitutional: He appears well-developed and well-nourished.  HENT:  Head: Normocephalic.  Neck: Normal range of motion. Neck supple.  Cardiovascular: Normal rate and regular rhythm.   Pulmonary/Chest: Effort normal and breath sounds normal.  Abdominal: Soft. He exhibits no mass. Bowel sounds are increased. There is no tenderness. There is no rebound and no guarding.       No current abdominal pain to palpation.   Musculoskeletal: Normal range of motion.  Neurological: He is alert. No cranial nerve deficit.  Skin: Skin is warm and dry. No rash noted.  Psychiatric: He has a normal mood and affect.    ED Course  Procedures (including critical care time)  Labs Reviewed - No data to  display Ct Abdomen W Contrast  03/21/2012  **ADDENDUM** CREATED: 03/21/2012 16:28:29  Original report dictated by Dr. Eppie Gibson.  Addended by Dr. Carlota Raspberry.  The study was reviewed with Dr. Truett Perna after the initial interpretation.  The patient has upper abdominal obstructive symptoms with nausea and vomiting.  The patient has been unresponsive to fluid clinically.  There is dilation of the  duodenal bulb.  The pylorus can be identified proximal to the duodenal bulb.  At the junction of the first and second part of the duodenum, there is narrowing of the duodenum with endoluminal nodular density and circumferential narrowing.  The appearance is compatible with stricture which may be malignant.  In this patient with known metastatic disease, this could represent seeding and invasion or potentially metastatic disease to bowel. Other neoplastic etiologies are considered less likely based on the presence of metastatic disease.  Significantly, the junction of the first and second part of the duodenum is narrowed although contrast does pass distally into the small bowel.  This may account for upper abdominal obstructive symptoms.  Gastroenterology consultation may be helpful to further evaluate, possibly with endoscopy for either dilation or passage of feeding tube distally.  **END ADDENDUM** SIGNED BY: Andreas Newport, M.D.    03/21/2012  *RADIOLOGY REPORT*  Clinical Data:  Follow-up metastatic colon carcinoma.  Recently completed chemotherapy.  CT ABDOMEN WITH CONTRAST  Technique:  Multidetector CT imaging of the abdomen was performed using the standard protocol following bolus administration of intravenous contrast.  Contrast: 80mL OMNIPAQUE IOHEXOL 300 MG/ML IJ SOLN  Comparison:  02/15/2012  Findings:  A hypovascular metastasis in the anterior segment of the right hepatic lobe has increased in size since previous study, now measuring 2.0 x 3.5 cm compared with with 1.7 x 2.0 cm on previous study.  At least two new  hypovascular metastases are seen in the dome of the right hepatic lobe which measure 1.9 cm and 2.6 cm in diameter.  The wire to other tiny less than 1 cm low attenuation lesions are seen which likely represent additional metastases.  Mild increase in lymphadenopathy is seen in the porta hepatis and celiac access as well as the portacaval space.  The largest lymph node in the portacaval space measures 1.7 cm in short axis compared with 1.0 cm on prior study.  A new 1.8 cm mass is also seen involving the left adrenal gland.  The spleen and pancreas remain normal in appearance.  Bilateral renal cysts are stable there is no evidence of renal mass or hydronephrosis.  Mild lymphadenopathy is seen left para-aortic region measuring up to 1.6 cm in short axis, which is increased since previous study.  There is no evidence of inflammatory process or abscess within the abdomen.  No evidence of dilated abdominal bowel loops.  IMPRESSION:  1.  Interval progression of liver metastases since prior study. 2.  New mild upper abdominal and retroperitoneal lymphadenopathy, consistent with metastatic disease. 3.  New small left adrenal mass, consistent with metastatic disease. Original Report Authenticated By: Andreas Newport, M.D.   Dg Abd 2 Views  03/20/2012  *RADIOLOGY REPORT*  Clinical Data: Left side abdominal pain with eating.  No bowel movement for 4 days.  ABDOMEN - 2 VIEW  Comparison: Two views abdomen 03/17/2012 and CT abdomen and pelvis 02/15/2012.  Findings: No free intraperitoneal air is identified.  The bowel gas pattern is unremarkable.  Mild gaseous distention of the colon seen on prior study has decreased.  No evidence of small bowel obstruction.  Cholecystectomy clips noted.  IMPRESSION: Negative exam.  Original Report Authenticated By: Bernadene Bell. Maricela Curet, M.D.     No diagnosis found.    MDM   Discussed with oncology - medicine requested for admission. D/W Dr. Ardyth Harps - to admit with GI consult  requested. Patient's family requesting [facility].       Rodena Medin, PA-C  03/22/12 1214 

## 2012-03-22 NOTE — H&P (Signed)
PCP:   Colette Ribas, MD, MD   Chief Complaint:  Post-prandial abdominal pain, weight loss  HPI: 71 y/o white man with a history of metastatic colon cancer followed by Dr. Myrle Sheng, who for the past 10 days has been having sharp abdominal pain about 10 minutes after eating. Only has emesis if he "eats enough". Has not been able to tolerate even liquids. Has lost 10 pounds in same amount of days.  Went to the cancer center for an unscheduled visit on 3/28, found to be very dehydrated and was given a liter of fluid and was scheduled for a CT scan on 3/29 that showed that at the junction of the first and second part of the duodenum, there is narrowing of the duodenum with endoluminal nodular density and circumferential narrowing.  The appearance is compatible with stricture which may be malignant. His pain worsened overnight, so he came to the ED for further evaluation and management. We are asked to admit him. Oncology and GI have already been consulted.  Allergies:   Allergies  Allergen Reactions  . Diltiazem Hcl     REACTION: Severe lower extremity edema on generic cardizem  . Penicillins     Pt can tolerate cephalosporins/Pt tol Zosyn 11/01/11  . Plasma Human       Past Medical History  Diagnosis Date  . History of ETOH abuse     quit 25 yrs ago, heavy etoh 5 yrs  . Colitis   . History of GI diverticular bleed 3/11  . Rectus sheath hematoma 3/10  . Status post Maze operation for atrial fibrillation   . Aortic aneurysm   . 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 S/P colonoscopy 05/03/10    mod diverticulosis sigmoid colon, internal hemorrhoids, suspected diverticular bleed  . Diverticulosis 07/23/2008    colonoscopy by Dr Jena Gauss, hyperplastic polyp  . IDA (iron deficiency anemia)     work-up including small bowel capsule study benign  . CHF (congestive heart failure)     diastolic  . COPD (chronic  obstructive pulmonary disease)   . CAD (coronary artery disease)     s/p CABG  . S/P aortic valve replacement   . Cancer right colon T3N2M1  . Weight decrease     20-30 lb in month  . Pneumonia   . History of colon polyps   . Shortness of breath   . Arthritis     bilateral neck and shoulder pain  . Atrial fibrillation     Past Surgical History  Procedure Date  . Maze   . Aortic valve replacement 04/16/2007  . Hemorrhoid surgery 1997  . Esophagogastroduodenoscopy 05/14/11    Dr Elnoria Howard reportedly normal  . Tonsillectomy   . Colonoscopy 05/17/11, 07/23/08, 07/05/06, 10/10/05    at [facility]: diverticlosis,internal hemorrhiods  . Portacath placement 07/16/2011  . Colon surgery 06/09/11    LAP. RIGHT COLECTOMY  . Cholecystectomy 06/09/11  . Cardiac catheterization 04/03/2007    Prior to Admission medications   Medication Sig Start Date End Date Taking? Authorizing Provider  ALPRAZolam (XANAX) 0.5 MG tablet TAKE 1/2 TO ONE TAB BY MOUTH EVERY 12 HOURS PRN ANXIETY. MAY ALSO USE FOR SLEEP PRN. 01/01/12  Yes Ladene Artist, MD  aspirin 81 MG tablet Take 81 mg by mouth daily.     Yes Historical Provider, MD  Cyanocobalamin (VITAMIN B 12 PO) Take 100 mg by mouth daily.  Yes Historical Provider, MD  docusate sodium (COLACE) 100 MG capsule Take 100 mg by mouth 2 (two) times daily.     Yes Historical Provider, MD  folic acid (FOLVITE) 400 MCG tablet Take 400 mcg by mouth daily.     Yes Historical Provider, MD  HYDROcodone-acetaminophen (NORCO) 10-325 MG per tablet Take 1 tablet by mouth every 6 (six) hours as needed for pain. 03/06/12  Yes Ladene Artist, MD  lidocaine-prilocaine (EMLA) cream Ad lib. 07/13/11  Yes Historical Provider, MD  metoprolol (LOPRESSOR) 50 MG tablet Take 50 mg by mouth 2 (two) times daily.   Yes Historical Provider, MD  mirtazapine (REMERON) 15 MG tablet Take 1 tablet (15 mg total) by mouth at bedtime. 03/17/12  Yes Ladene Artist, MD  ondansetron (ZOFRAN) 8 MG tablet Take by  mouth every 8 (eight) hours as needed. For nausea 11/29/11  Yes Ladene Artist, MD  pantoprazole (PROTONIX) 40 MG tablet Take 1 tablet (40 mg total) by mouth daily. 03/10/12  Yes Ladene Artist, MD  polyethylene glycol [facility] / GLYCOLAX) packet Take 17 g by mouth at bedtime.   Yes Historical Provider, MD  prochlorperazine (COMPAZINE) 10 MG tablet Take 10 mg by mouth every 6 (six) hours as needed. Nausea    Yes Historical Provider, MD  traMADol (ULTRAM) 50 MG tablet Take 1 tablet (50 mg total) by mouth every 6 (six) hours as needed for pain. 03/17/12 03/27/12 Yes Ladene Artist, MD  mirtazapine (REMERON) 15 MG tablet Take 1 tablet (15 mg total) by mouth at bedtime. 11/21/11 12/21/11  Rana Snare, NP    Social History:  reports that he quit smoking about 6 months ago. His smoking use included Cigarettes. He has a 84 pack-year smoking history. He does not have any smokeless tobacco history on file. He reports that he does not drink alcohol or use illicit drugs.  Family History  Problem Relation Age of Onset  . Lung cancer Mother   . Cancer Mother     lung  . Coronary artery disease Father   . Heart attack Father   . Lung cancer Sister   . Cancer Sister     lung  . Pancreatic cancer Brother   . Cancer Brother     pancreatic    Review of Systems:  Negative except as mentioned in HPI.  Physical Exam: Blood pressure 132/74, pulse 105, temperature 98.2 F (36.8 C), temperature source Oral, resp. rate 18, height 5\' 10"  (1.778 m), weight 78.926 kg (174 lb), SpO2 100.00%. Gen: AAOx3, pleasant, in moderate distress secondary to epigastric abdominal pain. HEENT: [place]/AT/PERRL/EOMI, dry mucous membranes. Neck: supple, no JVD, no lymphadenopathy, no bruits no goiter. CV: RRR, no M/R/G Lungs: CTA B Abdomen: S/ND/+BS, pain to deep palpation of epigastrium. Ext: no C/C/E, positive pedal pulses. Neuro: grossly intact and nonfocal.  Labs on Admission:  No results found for this or any previous  visit (from the past 48 hour(s)).  Radiological Exams on Admission: Ct Abdomen W Contrast  03/21/2012  **ADDENDUM** CREATED: 03/21/2012 16:28:29  Original report dictated by Dr. Eppie Gibson.  Addended by Dr. Carlota Raspberry.  The study was reviewed with Dr. Truett Perna after the initial interpretation.  The patient has upper abdominal obstructive symptoms with nausea and vomiting.  The patient has been unresponsive to fluid clinically.  There is dilation of the duodenal bulb.  The pylorus can be identified proximal to the duodenal bulb.  At the junction of the first and second part of the  duodenum, there is narrowing of the duodenum with endoluminal nodular density and circumferential narrowing.  The appearance is compatible with stricture which may be malignant.  In this patient with known metastatic disease, this could represent seeding and invasion or potentially metastatic disease to bowel. Other neoplastic etiologies are considered less likely based on the presence of metastatic disease.  Significantly, the junction of the first and second part of the duodenum is narrowed although contrast does pass distally into the small bowel.  This may account for upper abdominal obstructive symptoms.  Gastroenterology consultation may be helpful to further evaluate, possibly with endoscopy for either dilation or passage of feeding tube distally.  **END ADDENDUM** SIGNED BY: Andreas Newport, M.D.    03/21/2012  *RADIOLOGY REPORT*  Clinical Data:  Follow-up metastatic colon carcinoma.  Recently completed chemotherapy.  CT ABDOMEN WITH CONTRAST  Technique:  Multidetector CT imaging of the abdomen was performed using the standard protocol following bolus administration of intravenous contrast.  Contrast: 80mL OMNIPAQUE IOHEXOL 300 MG/ML IJ SOLN  Comparison:  02/15/2012  Findings:  A hypovascular metastasis in the anterior segment of the right hepatic lobe has increased in size since previous study, now measuring 2.0 x 3.5 cm compared with  with 1.7 x 2.0 cm on previous study.  At least two new hypovascular metastases are seen in the dome of the right hepatic lobe which measure 1.9 cm and 2.6 cm in diameter.  The wire to other tiny less than 1 cm low attenuation lesions are seen which likely represent additional metastases.  Mild increase in lymphadenopathy is seen in the porta hepatis and celiac access as well as the portacaval space.  The largest lymph node in the portacaval space measures 1.7 cm in short axis compared with 1.0 cm on prior study.  A new 1.8 cm mass is also seen involving the left adrenal gland.  The spleen and pancreas remain normal in appearance.  Bilateral renal cysts are stable there is no evidence of renal mass or hydronephrosis.  Mild lymphadenopathy is seen left para-aortic region measuring up to 1.6 cm in short axis, which is increased since previous study.  There is no evidence of inflammatory process or abscess within the abdomen.  No evidence of dilated abdominal bowel loops.  IMPRESSION:  1.  Interval progression of liver metastases since prior study. 2.  New mild upper abdominal and retroperitoneal lymphadenopathy, consistent with metastatic disease. 3.  New small left adrenal mass, consistent with metastatic disease. Original Report Authenticated By: Andreas Newport, M.D.    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 GI (Dr. Juanda Chance) has been consulted by the EDP, to see if dilatation and stent placement endoscopically is feasible. Otherwise may require a surgical consultation. Has been started on TNA given his anorexia and weight loss by oncology.  #2 DVT prophylaxis: lovenox.  Time Spent on Admission: 60 minutes  HERNANDEZ ACOSTA,Alastair Hennes [facility] Pager: [phone] 03/22/2012, 2:52 PM

## 2012-03-22 NOTE — ED Provider Notes (Signed)
Medical screening examination/treatment/procedure(s) were conducted as a shared visit with non-physician practitioner(s) and myself.  I personally evaluated the patient during the encounter   Patient seen by me, followed by hematology oncology service here family was informed that if he did not improve he will require admission today there is concern for a blockage in his duodenal area due to his metastatic colon cancer. Patient's primary care doctor is in [facility]. We'll discuss with on-call hematology oncology for admission if they want to just consult will contact the hospitalist service to formally do the admission. Basic labs not required they were just done yesterday the patient was receiving fluids currently.     Shelda Jakes, MD 03/22/12 [phone]

## 2012-03-22 NOTE — Consult Note (Signed)
[facility] Consultation  Referring Provider: No ref. provider found Primary Care Physician:  Colette Ribas, MD, MD Primary Gastroenterologist:  Dr.Rorke, Dr Elnoria Howard  Reason for Consultation:Vomiting everything x 5 days, foul smalling stuff, epig.pain, weight loss 9 lbs in last few days.Abnormal CT scan  HPI: Kyle Love is a 71 y.o. male with Stage 4 colon cancer, diagnosed on colon 04/2011 by Dr Kendell Bane, previously evaluated by Dr Elnoria Howard 12/2010 with normal EGD and SBCE. He underwent right hemicolectomy by Dr Abbey Chatters in 04/2011 with findings of residual disease. He has completed Chemo Rx by Dr Truett Perna. CT scan of the abdomen on 03/20/2012 showed enlarged portal nodes, enlarging liver lesions and a stricturing between first and second portion duodenum. He has not had any solid food for  7 days.   Past Medical History  Diagnosis Date  . History of ETOH abuse     quit 25 yrs ago, heavy etoh 5 yrs  . Colitis   . History of GI diverticular bleed 3/11  . Rectus sheath hematoma 3/10  . Status post Maze operation for atrial fibrillation   . Aortic aneurysm   . 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 S/P colonoscopy 05/03/10    mod diverticulosis sigmoid colon, internal hemorrhoids, suspected diverticular bleed  . Diverticulosis 07/23/2008    colonoscopy by Dr Jena Gauss, hyperplastic polyp  . IDA (iron deficiency anemia)     work-up including small bowel capsule study benign  . CHF (congestive heart failure)     diastolic  . COPD (chronic obstructive pulmonary disease)   . CAD (coronary artery disease)     s/p CABG  . S/P aortic valve replacement   . Cancer right colon [id number]  . Weight decrease     20-30 lb in month  . Pneumonia   . History of colon polyps   . Shortness of breath   . Arthritis     bilateral neck and shoulder pain  . Atrial fibrillation     Past Surgical History  Procedure Date   . Maze   . Aortic valve replacement 04/16/2007  . Hemorrhoid surgery 1997  . Esophagogastroduodenoscopy 05/14/11    Dr Elnoria Howard reportedly normal  . Tonsillectomy   . Colonoscopy 05/17/11, 07/23/08, 07/05/06, 10/10/05    at [facility]: diverticlosis,internal hemorrhiods  . Portacath placement 07/16/2011  . Colon surgery 06/09/11    LAP. RIGHT COLECTOMY  . Cholecystectomy 06/09/11  . Cardiac catheterization 04/03/2007    Prior to Admission medications   Medication Sig Start Date End Date Taking? Authorizing Provider  ALPRAZolam (XANAX) 0.5 MG tablet TAKE 1/2 TO ONE TAB BY MOUTH EVERY 12 HOURS PRN ANXIETY. MAY ALSO USE FOR SLEEP PRN. 01/01/12  Yes Ladene Artist, MD  aspirin 81 MG tablet Take 81 mg by mouth daily.     Yes Historical Provider, MD  Cyanocobalamin (VITAMIN B 12 PO) Take 100 mg by mouth daily.    Yes Historical Provider, MD  docusate sodium (COLACE) 100 MG capsule Take 100 mg by mouth 2 (two) times daily.     Yes Historical Provider, MD  folic acid (FOLVITE) 400 MCG tablet Take 400 mcg by mouth daily.     Yes Historical Provider, MD  HYDROcodone-acetaminophen (NORCO) 10-325 MG per tablet Take 1 tablet by mouth every 6 (six) hours as needed for pain. 03/06/12  Yes Ladene Artist, MD  lidocaine-prilocaine (EMLA) cream Ad lib. 07/13/11  Yes Historical  Provider, MD  metoprolol (LOPRESSOR) 50 MG tablet Take 50 mg by mouth 2 (two) times daily.   Yes Historical Provider, MD  mirtazapine (REMERON) 15 MG tablet Take 1 tablet (15 mg total) by mouth at bedtime. 03/17/12  Yes Ladene Artist, MD  ondansetron (ZOFRAN) 8 MG tablet Take by mouth every 8 (eight) hours as needed. For nausea 11/29/11  Yes Ladene Artist, MD  pantoprazole (PROTONIX) 40 MG tablet Take 1 tablet (40 mg total) by mouth daily. 03/10/12  Yes Ladene Artist, MD  polyethylene glycol [facility] / GLYCOLAX) packet Take 17 g by mouth at bedtime.   Yes Historical Provider, MD  prochlorperazine (COMPAZINE) 10 MG tablet Take 10 mg by mouth every 6  (six) hours as needed. Nausea    Yes Historical Provider, MD  traMADol (ULTRAM) 50 MG tablet Take 1 tablet (50 mg total) by mouth every 6 (six) hours as needed for pain. 03/17/12 03/27/12 Yes Ladene Artist, MD  mirtazapine (REMERON) 15 MG tablet Take 1 tablet (15 mg total) by mouth at bedtime. 11/21/11 12/21/11  Rana Snare, NP    Current Facility-Administered Medications  Medication Dose Route Frequency Provider Last Rate Last Dose  . 0.9 %  sodium chloride infusion   Intravenous Continuous Henderson Cloud, MD 100 mL/hr at 03/22/12 1501    . enoxaparin (LOVENOX) injection 40 mg  40 mg Subcutaneous Q24H Henderson Cloud, MD      . fat emulsion 20 % infusion 250 mL  250 mL Intravenous Continuous TPN Rollene Fare, PHARMD      . morphine 2 MG/ML injection 1 mg  1 mg Intravenous Q4H PRN Henderson Cloud, MD   1 mg at 03/22/12 1500  . ondansetron (ZOFRAN) tablet 4 mg  4 mg Oral Q6H PRN Henderson Cloud, MD       Or  . ondansetron [facility]) injection 4 mg  4 mg Intravenous Q6H PRN Henderson Cloud, MD      . sodium chloride 0.9 % bolus 1,000 mL  1,000 mL Intravenous Once Rodena Medin, PA-C   1,000 mL at 03/22/12 1113  . tpn solution (CLINIMIX E 5/15) 1,000 mL infusion   Intravenous Continuous TPN Rollene Fare, PHARMD       Facility-Administered Medications Ordered in Other Encounters  Medication Dose Route Frequency Provider Last Rate Last Dose  . DISCONTD: 0.9 %  sodium chloride infusion   Intravenous Continuous Rana Snare, NP 300 mL/hr at 03/20/12 1318      Allergies as of 03/22/2012 - Review Complete 03/22/2012  Allergen Reaction Noted  . Diltiazem hcl  07/11/2009  . Penicillins    . Plasma human  07/04/2011    Family History  Problem Relation Age of Onset  . Lung cancer Mother   . Cancer Mother     lung  . Coronary artery disease Father   . Heart attack Father   . Lung cancer Sister   . Cancer Sister     lung  .  Pancreatic cancer Brother   . Cancer Brother     pancreatic    History   Social History  . Marital Status: Married    Spouse Name: N/A    Number of Children: N/A  . Years of Education: N/A   Occupational History  . Not on file.   Social History Main Topics  . Smoking status: Former Smoker -- 1.5 packs/day for 56 years  Types: Cigarettes    Quit date: 08/25/2011  . Smokeless tobacco: Not on file   Comment: recently restarted  . Alcohol Use: No     history etoh abuse, quit 25 yrs ago  . Drug Use: No  . Sexually Active: No   Other Topics Concern  . Not on file   Social History Narrative  . No narrative on file    Review of Systems: Gen: Denies any fever, chills, sweats, anorexia, fatigue, weakness, malaise,and sleep disorder, positive for weight loss 9 lbs CV: Denies chest pain, angina, palpitations, syncope, orthopnea, PND, peripheral edema, and claudication. Resp: Denies dyspnea at rest, dyspnea with exercise, cough, sputum, wheezing, coughing up blood, and pleurisy. GI: Denies vomiting blood, jaundice, and fecal incontinence.   Denies dysphagia or odynophagia., vomitting post prandially undigested food GU : Denies urinary burning, blood in urine, urinary frequency, urinary hesitancy, nocturnal urination, and urinary incontinence. MS: Denies joint pain, limitation of movement, and swelling, stiffness, low back pain, extremity pain. Denies muscle weakness, cramps, atrophy.  Derm: Denies rash, itching, dry skin, hives, moles, warts, or unhealing ulcers.  Psych: Denies depression, anxiety, memory loss, suicidal ideation, hallucinations, paranoia, and confusion. Heme: Denies bruising, bleeding, and enlarged lymph nodes. Neuro:  Denies any headaches, dizziness, paresthesias. Endo:  Denies any problems with DM, thyroid, adrenal function.  Physical Exam: Vital signs in last 24 hours: Temp:  [97.6 F (36.4 C)-98.2 F (36.8 C)] 98.2 F (36.8 C) (03/30 1342) Pulse Rate:   [104-105] 105  (03/30 1342) Resp:  [16-19] 18  (03/30 1342) BP: (108-133)/(67-79) 132/74 mmHg (03/30 1342) SpO2:  [96 %-100 %] 100 % (03/30 1342) Weight:  [174 lb (78.926 kg)] 174 lb (78.926 kg) (03/30 1346) Last BM Date: 03/22/12 General:   Alert,  Well-developed, well-nourished, pleasant and cooperative in NAD Head:  Normocephalic and atraumatic. Eyes:  Sclera clear, no icterus.   Conjunctiva pink. Ears:  Normal auditory acuity. Nose:  No deformity, discharge,  or lesions. Mouth:  No deformity or lesions.  Oropharynx pink & moist. Neck:  Supple; no masses or thyromegaly. Lungs:  Clear throughout to auscultation.   No wheezes, crackles, or rhonchi. No acute distress. Heart:  Regular rate and rhythmmechanical valve click. Abdomen:  Soft, nontender and nondistended. No masses, hepatosplenomegaly or hernias noted. Normal bowel sounds, without guarding, and without rebound.   Rectal: not done  Msk:  Symmetrical without gross deformities. Normal posture. Pulses:  Normal pulses noted. Extremities:  Without clubbing or edema. Neurologic:  Alert and  oriented x4;  grossly normal neurologically. Skin:  Intact without significant lesions or rashes. Cervical Nodes:  No significant cervical adenopathy. Psych:  Alert and cooperative. Normal mood and affect.  Intake/Output from previous day:   Intake/Output this shift:    Lab Results:  Basename 03/22/12 1510 03/20/12 1256  WBC 7.0 9.1  HGB 11.4* 12.4*  HCT 33.9* 35.4*  PLT 210 252   BMET  Basename 03/22/12 1510 03/20/12 1256  NA -- 133*  K -- 3.8  CL -- 96  CO2 -- 29  GLUCOSE -- 132*  BUN -- 27*  CREATININE 1.09 1.17  CALCIUM -- 9.7   LFT  Basename 03/20/12 1256  PROT 8.2  ALBUMIN 3.2*  AST 68*  ALT 87*  ALKPHOS 207*  BILITOT 0.5  BILIDIR --  IBILI --   PT/INR No results found for this basename: LABPROT:2,INR:2 in the last 72 hours Hepatitis Panel No results found for this basename: HEPBSAG,HCVAB,HEPAIGM,HEPBIGM in  the last 72 hours  Studies/Results: Ct Abdomen W Contrast  03/21/2012  **ADDENDUM** CREATED: 03/21/2012 16:28:29  Original report dictated by Dr. Eppie Gibson.  Addended by Dr. Carlota Raspberry.  The study was reviewed with Dr. Truett Perna after the initial interpretation.  The patient has upper abdominal obstructive symptoms with nausea and vomiting.  The patient has been unresponsive to fluid clinically.  There is dilation of the duodenal bulb.  The pylorus can be identified proximal to the duodenal bulb.  At the junction of the first and second part of the duodenum, there is narrowing of the duodenum with endoluminal nodular density and circumferential narrowing.  The appearance is compatible with stricture which may be malignant.  In this patient with known metastatic disease, this could represent seeding and invasion or potentially metastatic disease to bowel. Other neoplastic etiologies are considered less likely based on the presence of metastatic disease.  Significantly, the junction of the first and second part of the duodenum is narrowed although contrast does pass distally into the small bowel.  This may account for upper abdominal obstructive symptoms.  Gastroenterology consultation may be helpful to further evaluate, possibly with endoscopy for either dilation or passage of feeding tube distally.  **END ADDENDUM** SIGNED BY: Andreas Newport, M.D.    03/21/2012  *RADIOLOGY REPORT*  Clinical Data:  Follow-up metastatic colon carcinoma.  Recently completed chemotherapy.  CT ABDOMEN WITH CONTRAST  Technique:  Multidetector CT imaging of the abdomen was performed using the standard protocol following bolus administration of intravenous contrast.  Contrast: 80mL OMNIPAQUE IOHEXOL 300 MG/ML IJ SOLN  Comparison:  02/15/2012  Findings:  A hypovascular metastasis in the anterior segment of the right hepatic lobe has increased in size since previous study, now measuring 2.0 x 3.5 cm compared with with 1.7 x 2.0 cm on previous  study.  At least two new hypovascular metastases are seen in the dome of the right hepatic lobe which measure 1.9 cm and 2.6 cm in diameter.  The wire to other tiny less than 1 cm low attenuation lesions are seen which likely represent additional metastases.  Mild increase in lymphadenopathy is seen in the porta hepatis and celiac access as well as the portacaval space.  The largest lymph node in the portacaval space measures 1.7 cm in short axis compared with 1.0 cm on prior study.  A new 1.8 cm mass is also seen involving the left adrenal gland.  The spleen and pancreas remain normal in appearance.  Bilateral renal cysts are stable there is no evidence of renal mass or hydronephrosis.  Mild lymphadenopathy is seen left para-aortic region measuring up to 1.6 cm in short axis, which is increased since previous study.  There is no evidence of inflammatory process or abscess within the abdomen.  No evidence of dilated abdominal bowel loops.  IMPRESSION:  1.  Interval progression of liver metastases since prior study. 2.  New mild upper abdominal and retroperitoneal lymphadenopathy, consistent with metastatic disease. 3.  New small left adrenal mass, consistent with metastatic disease. Original Report Authenticated By: Andreas Newport, M.D.     Previous Endoscopies: See above  Impression / Plan: Acute N&V  Due to duodenal outlet obstruction likely from an extrinsic compression by enlarged lymph nodes from Stage 4 adenocarcinoma of the colon resected 04/2011, s/p chemotherapy, possible invasion of the tumor into the duodenum, resulting in inability to eat and resulting  weight loss. Plan to proceed with upper endoscopy to assess the nature of the obstruction, possible balloon dilation and assessment for a placement of  a stent later on. He will remain on bowl rest tillEGD, consider cTNA depending on whether aggressive chemoRx planned. Possible surgical placement of a jejunal feeding tube distal to the  obstruction.    LOS: 0 days   Lina Sar  03/22/2012, 6:19 PM

## 2012-03-23 ENCOUNTER — Encounter (HOSPITAL_COMMUNITY): Payer: Self-pay | Admitting: *Deleted

## 2012-03-23 ENCOUNTER — Encounter (HOSPITAL_COMMUNITY)
Admission: EM | Disposition: A | Discharge status: Home / Self Care | Payer: Self-pay | Source: Home / Self Care | Attending: Internal Medicine

## 2012-03-23 HISTORY — PX: ESOPHAGOGASTRODUODENOSCOPY: SHX5428

## 2012-03-23 LAB — COMPREHENSIVE METABOLIC PANEL
ALT: 81 U/L — ABNORMAL HIGH (ref 0–53)
AST: 79 U/L — ABNORMAL HIGH (ref 0–37)
Albumin: 2.6 g/dL — ABNORMAL LOW (ref 3.5–5.2)
Alkaline Phosphatase: 205 U/L — ABNORMAL HIGH (ref 39–117)
Potassium: 3.5 mEq/L (ref 3.5–5.1)
Sodium: 133 mEq/L — ABNORMAL LOW (ref 135–145)
Total Protein: 6.4 g/dL (ref 6.0–8.3)

## 2012-03-23 LAB — SURGICAL PCR SCREEN
MRSA, PCR: NEGATIVE
Staphylococcus aureus: NEGATIVE

## 2012-03-23 LAB — CBC
HCT: 31.9 % — ABNORMAL LOW (ref 39.0–52.0)
MCHC: 34.2 g/dL (ref 30.0–36.0)
MCV: 96.4 fL (ref 78.0–100.0)
RDW: 16 % — ABNORMAL HIGH (ref 11.5–15.5)

## 2012-03-23 LAB — MAGNESIUM: Magnesium: 2.1 mg/dL (ref 1.5–2.5)

## 2012-03-23 LAB — TRIGLYCERIDES: Triglycerides: 89 mg/dL

## 2012-03-23 LAB — CHOLESTEROL, TOTAL: Cholesterol: 105 mg/dL (ref 0–200)

## 2012-03-23 SURGERY — EGD (ESOPHAGOGASTRODUODENOSCOPY)
Anesthesia: Moderate Sedation

## 2012-03-23 MED ORDER — ONDANSETRON HCL 4 MG/2ML IJ SOLN
4.0000 mg | Freq: Four times a day (QID) | INTRAMUSCULAR | Status: DC | PRN
  Administered 2012-03-25 – 2012-03-26 (×2): 4 mg via INTRAVENOUS
  Filled 2012-03-23 (×2): qty 2

## 2012-03-23 MED ORDER — CLINIMIX E/DEXTROSE (5/20) 5 % IV SOLN
INTRAVENOUS | Status: AC
  Administered 2012-03-23: 17:00:00 via INTRAVENOUS
  Filled 2012-03-23: qty 2000

## 2012-03-23 MED ORDER — MIDAZOLAM HCL 10 MG/2ML IJ SOLN
INTRAMUSCULAR | Status: AC
  Filled 2012-03-23: qty 2

## 2012-03-23 MED ORDER — HYDROMORPHONE HCL PF 1 MG/ML IJ SOLN: 1.0000 mg | INTRAMUSCULAR | Status: DC | PRN

## 2012-03-23 MED ORDER — METOPROLOL TARTRATE 50 MG PO TABS
50.0000 mg | ORAL_TABLET | Freq: Two times a day (BID) | ORAL | Status: DC
  Administered 2012-03-23 – 2012-03-25 (×4): 50 mg via ORAL
  Filled 2012-03-23 (×5): qty 1

## 2012-03-23 MED ORDER — HYDROMORPHONE HCL PF 1 MG/ML IJ SOLN
1.0000 mg | INTRAMUSCULAR | Status: DC | PRN
  Administered 2012-03-23 – 2012-03-25 (×16): 1 mg via INTRAVENOUS
  Filled 2012-03-23 (×17): qty 1

## 2012-03-23 MED ORDER — ENOXAPARIN SODIUM 40 MG/0.4ML ~~LOC~~ SOLN
40.0000 mg | SUBCUTANEOUS | Status: DC
  Administered 2012-03-23 – 2012-03-24 (×2): 40 mg via SUBCUTANEOUS
  Filled 2012-03-23 (×3): qty 0.4

## 2012-03-23 MED ORDER — MORPHINE SULFATE 2 MG/ML IJ SOLN
2.0000 mg | INTRAMUSCULAR | Status: DC | PRN
  Administered 2012-03-23 (×3): 2 mg via INTRAVENOUS
  Filled 2012-03-23 (×3): qty 1

## 2012-03-23 MED ORDER — BUTAMBEN-TETRACAINE-BENZOCAINE 2-2-14 % EX AERO
INHALATION_SPRAY | CUTANEOUS | Status: DC | PRN
  Administered 2012-03-23: 2 via TOPICAL

## 2012-03-23 MED ORDER — MIDAZOLAM HCL 10 MG/2ML IJ SOLN
INTRAMUSCULAR | Status: DC | PRN
  Administered 2012-03-23: 2 mg via INTRAVENOUS
  Administered 2012-03-23 (×2): 1 mg via INTRAVENOUS

## 2012-03-23 MED ORDER — ONDANSETRON HCL 4 MG PO TABS: 4.0000 mg | ORAL_TABLET | Freq: Four times a day (QID) | ORAL | Status: DC | PRN

## 2012-03-23 MED ORDER — HYDROMORPHONE HCL PF 1 MG/ML IJ SOLN
1.0000 mg | INTRAMUSCULAR | Status: DC | PRN
  Administered 2012-03-23 (×2): 1 mg via INTRAVENOUS
  Filled 2012-03-23 (×3): qty 1

## 2012-03-23 MED ORDER — SODIUM CHLORIDE 0.9 % IV SOLN: INTRAVENOUS | Status: DC

## 2012-03-23 MED ORDER — FENTANYL NICU IV SYRINGE 50 MCG/ML
INJECTION | INTRAMUSCULAR | Status: DC | PRN
  Administered 2012-03-23: 25 ug via INTRAVENOUS

## 2012-03-23 MED ORDER — CLINIMIX E/DEXTROSE (5/20) 5 % IV SOLN
INTRAVENOUS | Status: DC
  Filled 2012-03-23: qty 2000

## 2012-03-23 MED ORDER — SODIUM CHLORIDE 0.9 % IV SOLN
INTRAVENOUS | Status: DC
Start: 2012-03-23 — End: 2012-03-25
  Administered 2012-03-23 – 2012-03-25 (×4): via INTRAVENOUS

## 2012-03-23 MED ORDER — FENTANYL CITRATE 0.05 MG/ML IJ SOLN
INTRAMUSCULAR | Status: AC
  Filled 2012-03-23: qty 2

## 2012-03-23 NOTE — Progress Notes (Signed)
PARENTERAL NUTRITION CONSULT NOTE - FOLLOW UP  Pharmacy Consult for TNA Indication: Malignant small bowel obstruction  Allergies  Allergen Reactions  . Diltiazem Hcl     REACTION: Severe lower extremity edema on generic cardizem  . Penicillins     Pt can tolerate cephalosporins/Pt tol Zosyn 11/01/11  . Plasma Human     Patient Measurements: Height: 5\' 10"  (177.8 cm) Weight: 178 lb 9.6 oz (81.012 kg) IBW/kg (Calculated) : 73  Adjusted Body Weight: 75 kg Recent weight loss: ~13 lb over 2 weeks  Vital Signs: Temp: 97.6 F (36.4 C) (03/31 0755) Temp src: Oral (03/31 0755) BP: 151/89 mmHg (03/31 0755) Pulse Rate: 117  (03/31 0755) Intake/Output from previous day: 03/30 0701 - 03/31 0700 In: 2133.3 [I.V.:1551.7; TPN:581.7] Out: 200 [Urine:200] Intake/Output from this shift: Total I/O In: -  Out: 300 [Urine:300]  Labs:  [facility] 03/23/12 0423 03/22/12 1510 03/20/12 1256  WBC 6.6 7.0 9.1  HGB 10.9* 11.4* 12.4*  HCT 31.9* 33.9* 35.4*  PLT 188 210 252  APTT -- -- --  INR -- -- --     Basename 03/23/12 0423 03/22/12 1510 03/22/12 1310 03/20/12 1256  NA 133* -- -- 133*  K 3.5 -- -- 3.8  CL 101 -- -- 96  CO2 25 -- -- 29  GLUCOSE 111* -- -- 132*  BUN 19 -- -- 27*  CREATININE 0.95 1.09 -- 1.17  LABCREA -- -- -- --  CREAT24HRUR -- -- -- --  CALCIUM 8.4 -- -- 9.7  MG 2.1 -- -- --  PHOS -- -- -- --  PROT 6.4 -- -- 8.2  ALBUMIN 2.6* -- -- 3.2*  AST 79* -- -- 68*  ALT 81* -- -- 87*  ALKPHOS 205* -- -- 207*  BILITOT 0.3 -- -- 0.5  BILIDIR -- -- -- --  IBILI -- -- -- --  PREALBUMIN -- -- 13.0* --  TRIG -- -- -- --  CHOLHDL -- -- -- --  CHOL -- -- -- --  Corrected calcium = 9.52  Estimated Creatinine Clearance: 72.6 ml/min (by C-G formula based on Cr of 0.95).    Medications:  Scheduled:    . enoxaparin  40 mg Subcutaneous Q24H  . sodium chloride  1,000 mL Intravenous Once   Infusions:    . sodium chloride 100 mL/hr at 03/23/12 0037  . fat emulsion 250  mL (03/22/12 1854)  . TPN (CLINIMIX) +/- additives 40 mL/hr at 03/22/12 1854     Basename 03/22/12 2245 03/22/12 1449  GLUCAP 103* 78    Insulin Requirements in the past 24 hours:  No sliding scale ordered currently  Current Nutrition:  Clinimix E5/15 at 23ml/hr 20% Lipids at 68ml/hr NPO, ice chips  IVF: NS at 122ml/hr  Assessment: 65 YOM with metastatic colon cancer, presents with nausea and abdominal pain, concerning for malignant bowel obstruction. Patient reports ~13lb weight loss over the past 2 weeks and is unable to eat or drink anything without significant pain. GI consulted, plan for EGD with possible balloon dilation and palliative stent.  Considering J-tube placement for post-obstruction feeds.  Continuing TNA for now. Electrolytes wnl CBGs in goal <150 LFTs elevated prior to starting TNA, unchanged today.  Monitor this closely.  Nutritional Goals:  RD assessment pending; for now will aim for Clinimix E5/20 16ml/hr = 108g protein (1.44g/kg/day), kcal 2380 MWF, 1900 TTSS, Avg 2107 (28kcal/kg/day)   Plan:   Change to Clinimix E5/20, advance rate 59ml/hr  Fat emulsion, MVI and trace elements MWF only  due to ongoing shortage  Reduce IVF to 16ml/hr to maintain 192ml/hr total  Continue CBGs q8h, no SSI ordered/required at this time  TNA lab panels on Mondays & Thursdays.  F/U RD recommendations   Loralee Pacas, PharmD, BCPS Pager: [phone] 03/23/2012,8:29 AM

## 2012-03-23 NOTE — Consult Note (Signed)
NAME:  Kyle Love, Kyle Love NO.:  [id number]  MEDICAL RECORD NO.:  [id number]  LOCATION:  1529                         FACILITY:  [facility]  PHYSICIAN:  Josph Macho, M.D.  DATE OF BIRTH:  07-30-1941  DATE OF CONSULTATION: DATE OF DISCHARGE:                                CONSULTATION   REFERRING PHYSICIAN:  Isidor Holts, M.D.  REASON FOR CONSULTATION: 1. Metastatic colon cancer. 2. 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  HISTORY OF PRESENT ILLNESS:  Kyle Love is a real nice 71 year old, white gentleman.  He followed Dr. Truett Perna.  He was diagnosed with colon cancer a year ago.  He had metastatic disease at the time of diagnosis. He underwent a palliative right colectomy.  He has been on systemic chemotherapy.  He had been receiving FOLFOX. His last chemotherapy with FOLFOX was about a month or so ago.  Dr. Truett Perna was given Kyle Love a "break" from chemo because of accumulated side affects.  Kyle Love further has had worsening issues with nausea and vomiting. He has had one way of abdominal pain.  He has been getting dehydrated. He has been to the [facility] 2 or 3 times the past week, getting IV fluids.  He finally had a CT scan done.  This was done on the 29th.  This unfortunately showed a malignant-appearing stricture in the proximal duodenum.  There was dilation of the duodenal bulb.  He had narrowing at the junction of the first and second portion of the duodenum.  It was felt that this was consistent with a stricture.  He has seen [place], GI in the past.  I believe the [place], GI has been consulted already.  He says he only has pain when he eats.  He says that he has some diarrhea as he is only able to get some liquids to "dribble through." He says that when he does eat, he has a "knife" in his stomach.  He has had no bleeding.  He has had no cough.  His gaze is quite weak.  This appears again worse  secondary to his lack of intake.  PAST MEDICAL HISTORY:  Remarkable for: 1. Atrial fibrillation - off Coumadin. 2. COPD. 3. Coronary artery disease, status post CABG. 4. Nephrolithiasis. 5. Aortic valve replacement.  ALLERGIES: 1. Diltiazem. 2. Penicillin. 3. "Human plasma."  MEDICATIONS:  On admission were: 1. Xanax 0.5 mg q.12 hours p.r.n. 2. Aspirin 81 mg p.o. daily. 3. Vitamin B12, 100 mg p.o. daily. 4. Folic acid 400 mcg p.o. daily. 5. Metoprolol 100 mg p.o. daily. 6. Remeron 15 mg p.o. at bedtime. 7. Zofran 8 mg p.o. q.8 hours p.r.n. 8. Protonix 40 mg p.o. daily. 9. MiraLax 17 g p.o. at bedtime. 10.Ultram 50 mg p.o. q.6 hours p.r.n.  SOCIAL HISTORY:  Negative for tobacco use.  There is no alcohol use.  FAMILY HISTORY:  Noncontributory.  PHYSICAL EXAMINATION:  GENERAL:  This is an elderly-appearing white gentleman in no obvious distress. VITAL SIGNS:  Show a temperature of 97.6, pulse 104, respiratory rate 18, and blood pressure 110/69.  Weight was not taken. HEENT:  Head and neck exam shows a normocephalic, atraumatic skull. There are  no ocular or oral lesions.  There are no palpable cervical or supraclavicular lymph nodes. LUNGS:  Clear bilaterally. CARDIAC EXAM:  Regular rate and rhythm with a normal S1, S2.  There are no murmurs, rubs, or bruits. ABDOMINAL EXAM:  Soft.  He has decent bowel sounds.  There is no guarding or rebound or tenderness.  He has no obvious fluid wave.  There is no subcutaneous nodularity.  He has no palpable hepatosplenomegaly. EXTREMITIES:  Show no clubbing, cyanosis, or edema.  He may have some slight muscle atrophy in upper lower extremities. NEUROLOGICAL EXAM:  Shows no focal neurological deficits.  LABORATORY STUDIES:  Labs are pending.  IMPRESSION:  Kyle Love is a 71 year old gentleman with metastatic colon cancer.  He now has nausea and vomiting.  It appears that he does have a malignant-appearing stricture in the proximal  duodenum.  I really believe that [place], Gastroenterology needs to be consulted (which I hope they have been all ready) to see about doing an upper endoscopy.  If they can reach that area of narrowing, they might be able to place a stent for palliative purposes.  If there is no intervention that is able to be done by Gastroenterology, Dr. Abbey Chatters of General surgery, who operated on Kyle Love initially, might need to be consulted to see if there is any indication for some type of open procedure and a bypass.  He clearly is in need of nutrition.  He does have a Port-A-Cath in.  I think it would be worthwhile starting him on TNA.  I think this might help pick up his nutritional reserves.  I will order a pre-albumin on him.  This, in my mind, is quite soon prognostic.  His wife and daughter was with [place].  They are both very nice.  The family is very sweet.  They definitely understand the situation quite well.  I have not addressed code status with Kyle Love.  I believe Dr. Truett Perna probably is the most appropriate physician for this as he has a very good relationship with Kyle Love.  We will definitely pray hard for Kyle Love.  He seems like a real nice guy.  We will certainly follow along closely while hospitalized.     Josph Macho, M.D.     PRE/MEDQ  D:  03/22/2012  T:  03/23/2012  Job:  [id number]

## 2012-03-23 NOTE — Progress Notes (Signed)
Subjective: Still with significant abdominal pain despite increased morphine dose.  Objective: Vital signs in last 24 hours: Temp:  [97.6 F (36.4 C)-98.9 F (37.2 C)] 98 F (36.7 C) (03/31 0508) Pulse Rate:  [104-107] 107  (03/31 0508) Resp:  [16-19] 18  (03/31 0508) BP: (108-141)/(67-79) 141/70 mmHg (03/31 0508) SpO2:  [96 %-100 %] 96 % (03/31 0508) Weight:  [78.926 kg (174 lb)-81.012 kg (178 lb 9.6 oz)] 81.012 kg (178 lb 9.6 oz) (03/31 0649) Weight change:  Last BM Date: 03/22/12  Intake/Output from previous day: 03/30 0701 - 03/31 0700 In: 2133.3 [I.V.:1551.7; TPN:581.7] Out: 200 [Urine:200]     Physical Exam: General: Alert, awake, oriented x3. HEENT: No bruits, no goiter. Heart: irregular rate and rhythm, without murmurs, rubs, gallops. Lungs: Clear to auscultation bilaterally. Abdomen: Soft,  nondistended, positive bowel sounds, tender to deep palpation of epigastric area. Extremities: No clubbing cyanosis or edema with positive pedal pulses. Neuro: Grossly intact, nonfocal.    Lab Results: Basic Metabolic Panel:  Basename 03/23/12 0423 03/22/12 1510 03/20/12 1256  NA 133* -- 133*  K 3.5 -- 3.8  CL 101 -- 96  CO2 25 -- 29  GLUCOSE 111* -- 132*  BUN 19 -- 27*  CREATININE 0.95 1.09 --  CALCIUM 8.4 -- 9.7  MG 2.1 -- --  PHOS -- -- --   Liver Function Tests:  [facility] 03/23/12 0423 03/20/12 1256  AST 79* 68*  ALT 81* 87*  ALKPHOS 205* 207*  BILITOT 0.3 0.5  PROT 6.4 8.2  ALBUMIN 2.6* 3.2*   CBC:  Basename 03/23/12 0423 03/22/12 1510  WBC 6.6 7.0  NEUTROABS -- --  HGB 10.9* 11.4*  HCT 31.9* 33.9*  MCV 96.4 98.3  PLT 188 210   CBG:  Basename 03/22/12 2245 03/22/12 1449  GLUCAP 103* 78    Studies/Results: Ct Abdomen W Contrast  03/21/2012  **ADDENDUM** CREATED: 03/21/2012 16:28:29  Original report dictated by Dr. Eppie Gibson.  Addended by Dr. Carlota Raspberry.  The study was reviewed with Dr. Truett Perna after the initial interpretation.  The patient has upper  abdominal obstructive symptoms with nausea and vomiting.  The patient has been unresponsive to fluid clinically.  There is dilation of the duodenal bulb.  The pylorus can be identified proximal to the duodenal bulb.  At the junction of the first and second part of the duodenum, there is narrowing of the duodenum with endoluminal nodular density and circumferential narrowing.  The appearance is compatible with stricture which may be malignant.  In this patient with known metastatic disease, this could represent seeding and invasion or potentially metastatic disease to bowel. Other neoplastic etiologies are considered less likely based on the presence of metastatic disease.  Significantly, the junction of the first and second part of the duodenum is narrowed although contrast does pass distally into the small bowel.  This may account for upper abdominal obstructive symptoms.  Gastroenterology consultation may be helpful to further evaluate, possibly with endoscopy for either dilation or passage of feeding tube distally.  **END ADDENDUM** SIGNED BY: Andreas Newport, M.D.    03/21/2012  *RADIOLOGY REPORT*  Clinical Data:  Follow-up metastatic colon carcinoma.  Recently completed chemotherapy.  CT ABDOMEN WITH CONTRAST  Technique:  Multidetector CT imaging of the abdomen was performed using the standard protocol following bolus administration of intravenous contrast.  Contrast: 80mL OMNIPAQUE IOHEXOL 300 MG/ML IJ SOLN  Comparison:  02/15/2012  Findings:  A hypovascular metastasis in the anterior segment of the right hepatic lobe has increased  in size since previous study, now measuring 2.0 x 3.5 cm compared with with 1.7 x 2.0 cm on previous study.  At least two new hypovascular metastases are seen in the dome of the right hepatic lobe which measure 1.9 cm and 2.6 cm in diameter.  The wire to other tiny less than 1 cm low attenuation lesions are seen which likely represent additional metastases.  Mild increase in  lymphadenopathy is seen in the porta hepatis and celiac access as well as the portacaval space.  The largest lymph node in the portacaval space measures 1.7 cm in short axis compared with 1.0 cm on prior study.  A new 1.8 cm mass is also seen involving the left adrenal gland.  The spleen and pancreas remain normal in appearance.  Bilateral renal cysts are stable there is no evidence of renal mass or hydronephrosis.  Mild lymphadenopathy is seen left para-aortic region measuring up to 1.6 cm in short axis, which is increased since previous study.  There is no evidence of inflammatory process or abscess within the abdomen.  No evidence of dilated abdominal bowel loops.  IMPRESSION:  1.  Interval progression of liver metastases since prior study. 2.  New mild upper abdominal and retroperitoneal lymphadenopathy, consistent with metastatic disease. 3.  New small left adrenal mass, consistent with metastatic disease. Original Report Authenticated By: Andreas Newport, M.D.    Medications: Scheduled Meds:   . enoxaparin  40 mg Subcutaneous Q24H  . sodium chloride  1,000 mL Intravenous Once   Continuous Infusions:   . sodium chloride 100 mL/hr at 03/23/12 0037  . fat emulsion 250 mL (03/22/12 1854)  . 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 Dr. Juanda Chance to attempt balloon dilatation and possible stent placement today. Has already been started on TNA. Will continue to follow. Pain management continues to be an issue. Will upgrade morphine to dilaudid.   LOS: 1 day   [facility] Pager: [phone] 03/23/2012, 7:47 AM

## 2012-03-23 NOTE — Progress Notes (Signed)
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 Will discuss plans for nutritional support with oncology and surgery. Prognosis guarded considering extent of his disease after chemotherapy

## 2012-03-23 NOTE — Op Note (Signed)
[facility] [address] [place], [place]  [address]  ENDOSCOPY PROCEDURE REPORT  PATIENT:  Kyle, Love  MR#:  [id number] BIRTHDATE:  1941-04-20, 70 yrs. old  GENDER:  male  ENDOSCOPIST:  Hedwig Morton. Juanda Chance, MD Referred by:  Assunta Found, M.D.  PROCEDURE DATE:  03/23/2012 PROCEDURE:  EGD with biopsy, 43239, EGD with balloon dilatation ASA CLASS:  Class III INDICATIONS:  nausea and vomiting, abnormal imaging stage 4 colon cancer with liver and portal adenopathy, CT scan shows stenosis at the duodenal outlet weight loss 9 lbs  MEDICATIONS:   There was residual sedation effect present from prior procedure., Versed 4 mg, Fentanyl 25 mcg TOPICAL ANESTHETIC:  Cetacaine Spray  DESCRIPTION OF PROCEDURE:   After the risks benefits and alternatives of the procedure were thoroughly explained, informed consent was obtained.  The Pentax Gastroscope [id number] endoscope was introduced through the mouth and advanced to the first portion of the duodenum, without limitations.  The instrument was slowly withdrawn as the mucosa was fully examined. <<PROCEDUREIMAGES>>  stenosis in the bulb and descending duodenum. eccentric pylorus, obstructed duodenal buld and descending duodenum by edematous folds and rubbery tissue extending from the pylorus at 82 cm over 6 cm to 88 cm, pediatric colonoscope used to enter the pylorus, there were no mucosal ulcerations or a infiltrating tumor Multiple biopsies were obtained and sent to pathology. Balloon dilatation was performed (see image1, image2, image6, image5, image8, image9, image10, and image7). Bx of the edematous folds taken 18mm BS balloon inflated in the stenotic area and held for 1 minute, no significant dilation achieved,lumen closed after balloon withdrawal  A hiatal hernia was found (see image4 and image3). 2 cm hiatal hernia with a nonobstructing fibrous ring Retroflexed views revealed no abnormalities.    The scope was  then withdrawn from the patient and the procedure completed.  COMPLICATIONS:  None  ENDOSCOPIC IMPRESSION:  RECOMMENDATIONS: 1) Await biopsy results surgical consult, oncology consult, will need to bypass obstruction or place feedinj jejunostomy depensing on plans for further ChemoRx, The obstruction is likely caused by enlarged lymph nodes in the area.  consider placement of a duodenal stent as a temporary measure  REPEAT EXAM:  In 0 year(s) for.  ______________________________ Hedwig Morton. Juanda Chance, MD  CC:  Avel Peace, MDPeter Myna Hidalgo, MD  n. Rosalie DoctorHedwig Morton. Treylon Henard at 03/23/2012 09:39 AM  Rennie Natter, [phone]

## 2012-03-23 NOTE — Interval H&P Note (Signed)
History and Physical Interval Note:  03/23/2012 8:41 AM  Kyle Love  has presented today for surgery, with the diagnosis of duodenal outlet obstruction  The various methods of treatment have been discussed with the patient and family. After consideration of risks, benefits and other options for treatment, the patient has consented to  Procedure(s) (LRB): ESOPHAGOGASTRODUODENOSCOPY (EGD) (N/A) as a surgical intervention .  The patients' history has been reviewed, patient examined, no change in status, stable for surgery.  I have reviewed the patients' chart and labs.  Questions were answered to the patient's satisfaction.     Lina Sar

## 2012-03-24 ENCOUNTER — Inpatient Hospital Stay (HOSPITAL_COMMUNITY): Payer: Medicare Other

## 2012-03-24 ENCOUNTER — Encounter (HOSPITAL_COMMUNITY): Payer: Self-pay

## 2012-03-24 ENCOUNTER — Encounter (HOSPITAL_COMMUNITY): Payer: Self-pay | Admitting: Internal Medicine

## 2012-03-24 DIAGNOSIS — K56609 Unspecified intestinal obstruction, unspecified as to partial versus complete obstruction: Secondary | ICD-10-CM

## 2012-03-24 DIAGNOSIS — R109 Unspecified abdominal pain: Secondary | ICD-10-CM

## 2012-03-24 DIAGNOSIS — C7800 Secondary malignant neoplasm of unspecified lung: Secondary | ICD-10-CM

## 2012-03-24 DIAGNOSIS — K315 Obstruction of duodenum: Secondary | ICD-10-CM | POA: Diagnosis present

## 2012-03-24 DIAGNOSIS — C787 Secondary malignant neoplasm of liver and intrahepatic bile duct: Secondary | ICD-10-CM

## 2012-03-24 LAB — CBC
HCT: 33.3 % — ABNORMAL LOW (ref 39.0–52.0)
MCV: 97.1 fL (ref 78.0–100.0)
RBC: 3.43 MIL/uL — ABNORMAL LOW (ref 4.22–5.81)
WBC: 7.1 10*3/uL (ref 4.0–10.5)

## 2012-03-24 LAB — COMPREHENSIVE METABOLIC PANEL
ALT: 77 U/L — ABNORMAL HIGH (ref 0–53)
Alkaline Phosphatase: 209 U/L — ABNORMAL HIGH (ref 39–117)
CO2: 26 mEq/L (ref 19–32)
GFR calc Af Amer: >90 mL/min (ref >90)
GFR calc non Af Amer: 82 mL/min — ABNORMAL LOW (ref >90)
Glucose, Bld: 128 mg/dL — ABNORMAL HIGH (ref 70–99)
Potassium: 4.2 mEq/L (ref 3.5–5.1)
Sodium: 135 mEq/L (ref 135–145)
Total Protein: 6.5 g/dL (ref 6.0–8.3)

## 2012-03-24 LAB — DIFFERENTIAL
Eosinophils Relative: 7 % — ABNORMAL HIGH (ref 0–5)
Lymphocytes Relative: 9 % — ABNORMAL LOW (ref 12–46)
Lymphs Abs: 0.6 10*3/uL — ABNORMAL LOW (ref 0.7–4.0)
Monocytes Absolute: 0.6 10*3/uL (ref 0.1–1.0)

## 2012-03-24 MED ORDER — TRACE MINERALS CR-CU-MN-SE-ZN 10-1000-500-60 MCG/ML IV SOLN
INTRAVENOUS | Status: AC
  Administered 2012-03-24: 18:00:00 via INTRAVENOUS
  Filled 2012-03-24: qty 2000

## 2012-03-24 MED ORDER — HEPARIN SODIUM (PORCINE) 5000 UNIT/ML IJ SOLN: 5000.0000 [IU] | Freq: Once | INTRAMUSCULAR | Status: DC

## 2012-03-24 MED ORDER — FAT EMULSION 20 % IV EMUL
240.0000 mL | INTRAVENOUS | Status: AC
  Administered 2012-03-24: 240 mL via INTRAVENOUS
  Filled 2012-03-24: qty 250

## 2012-03-24 MED ORDER — SODIUM CHLORIDE 0.9 % IV SOLN
1.0000 g | INTRAVENOUS | Status: AC
  Administered 2012-03-25: 1 g via INTRAVENOUS
  Filled 2012-03-24: qty 1

## 2012-03-24 NOTE — Progress Notes (Signed)
Patient ID: ZAFIR SCHAUER, male   DOB: Jul 19, 1941, 71 y.o.   MRN: [id number] [facility] Progress Note  Subjective: Able to take small amts of liquids. He is having ongoing abdominal pain-primarily in epigastrium and LUQ. He is using Dilaudid regularly which helps.  Objective:  Vital signs in last 24 hours: Temp:  [97.4 F (36.3 C)-98.5 F (36.9 C)] 97.6 F (36.4 C) (04/01 0500) Pulse Rate:  [85-110] 107  (04/01 0500) Resp:  [16-20] 18  (04/01 0500) BP: (123-152)/(66-83) 137/83 mmHg (04/01 0500) SpO2:  [95 %-100 %] 95 % (04/01 0500) Weight:  [183 lb 1 oz (83.037 kg)] 183 lb 1 oz (83.037 kg) (04/01 0919) Last BM Date: 03/22/12 General:   Alert,  Well-developed,    in NAD Heart:  Regular rate and rhythm; no murmurs Pulm;clear Abdomen:  Tender across upper abdomen,somewhat distended,bs+, no palp mass Neurologic:  Alert and  oriented x4;  grossly normal neurologically. Psych:  Alert and cooperative. Normal mood and affect.  Intake/Output from previous day: 03/31 0701 - 04/01 0700 In: 2342 [P.O.:360; I.V.:935; TPN:1047] Out: 750 [Urine:750] Intake/Output this shift:    Lab Results:  Basename 03/24/12 0421 03/23/12 0423 03/22/12 1510  WBC 7.1 6.6 7.0  HGB 11.2* 10.9* 11.4*  HCT 33.3* 31.9* 33.9*  PLT 181 188 210   BMET  Basename 03/24/12 0421 03/23/12 0423 03/22/12 1510  NA 135 133* --  K 4.2 3.5 --  CL 103 101 --  CO2 26 25 --  GLUCOSE 128* 111* --  BUN 15 19 --  CREATININE 0.92 0.95 1.09  CALCIUM 8.5 8.4 --   LFT  Basename 03/24/12 0421  PROT 6.5  ALBUMIN 2.5*  AST 64*  ALT 77*  ALKPHOS 209*  BILITOT 0.3  BILIDIR --  IBILI --    Assessment / Plan: 71 yo male with metastatic colon cancer with 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 Stricture endoscopically appears to be an extrinsic process. Bx pending. Surgery is to see today-may do UGI next to further outline . Continue sips clears and TNA Pt had recent PET  scan 02/20/12, and CT was done on admit without obvious mass or adenopathy around duodenum Principal Problem:  *Bowel obstruction Active Problems:  COLON CANCER  Atrial fibrillation  Transaminitis  Unspecified intestinal obstruction     LOS: 2 days   Jo-Anne Kluth  03/24/2012, 9:41 AM

## 2012-03-24 NOTE — Consult Note (Signed)
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 Referring Physician: [name] is an 71 y.o. male.  HPI: Patient is a 71 year old gentleman with a history of stage IV colon cancer who underwent laparoscopic converted to open right colectomy removal of the omental mass laparoscopic cholecystectomy June 2012 by Dr. Abbey Chatters.  He is also undergone chemotherapy; FOLFOX. He now returns after a break from his chemotherapy with issues of nausea and vomiting. He's had a 9 pound plus weight loss, since 3/13. He was taking some liquids, but has reached the point that even clears cause severe reflux, and abdominal pain, nausea, and he's vomited a couple times after eating.  CT scan on this admission showed dilatation the duodenal bulb. There is narrow ring of the duodenal endoluminal nodular density, compatible with a stricture which might be malignant. There is significant duodenal narrowing although contrast does pass distally into the small bowel. EGD by Dr. Juanda Chance, 03/23/12, shows stenosis in the bulb and the descending duodenum. Eccentric pylorus, obstructive duodenal bulb and the descending duodenum was edematous. There was no mucosal ulcerations or infiltrating tumor seen. Multiple biopsies were obtained and sent to pathology. Balloon dilatation was also performed. PET scan 02/27/2012, shows a nodular density which is new in the liver. There are several densities unchanged and some no longer evident. There is a decrease in the bulky retroperitoneal lymph nodes, all observed after chemotherapy. He was seen on 03/22/12, by Dr. 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 Dr. Derrell Lolling has been asked to see in consultation.  Past Medical History  Diagnosis Date  . )Aortic aneurysm s/p AVR    s/p CABG, RCA reimplnant with  aortic valve replacement, CHF (congestive heart failure/fluid overload post op)   04/16/2007 Dr. Laneta Simmers    Atrial fibrillation,  . Status post Maze operation for atrial fibrillation, Hx of multiple DCCV, now off coumadin for GI bleeds      . Colitis/History of GI diverticular bleed/GERD 3/11  T3N2M1  . Stage IV Colon cancer with Right open colectomy, and removal of Omental Mass, cholecystectomy 06/08/11. Dr. 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 S/P colonoscopy 05/03/10   mod diverticulosis sigmoid colon, internal hemorrhoids, suspected diverticular bleed     . Diverticulosis 07/23/2008       colonoscopy by Dr Jena Gauss, hyperplastic polyp  . COPD (chronic obstructive pulmonary disease) 50 year hx of tobacco use . Pneumonia by history         IDA (iron deficiency anemia)    work-up including small bowel capsule study benign    . History of ETOH abuse    quit 25 yrs ago, heavy etoh 5 yrs             Arthritis    bilateral neck and shoulder pain          Past Surgical History  Procedure Date  . Maze   . Aortic valve replacement 04/16/2007  . Hemorrhoid surgery 1997  . Esophagogastroduodenoscopy 05/14/11    Dr Elnoria Howard reportedly normal  . Tonsillectomy   . Colonoscopy 05/17/11, 07/23/08, 07/05/06, 10/10/05    at [facility]: diverticlosis,internal hemorrhiods  . Portacath placement 07/16/2011  . Colon surgery 06/09/11    LAP. RIGHT COLECTOMY  . Cholecystectomy 06/09/11  . Cardiac catheterization 04/03/2007  . Esophagogastroduodenoscopy  03/23/2012    Procedure: ESOPHAGOGASTRODUODENOSCOPY (EGD);  Surgeon: Hart Carwin, MD;  Location: [facility];  Service: Endoscopy;  Laterality: N/A;    Family History  Problem Relation Age of Onset  . Lung cancer Mother   . Cancer Mother     lung  . Coronary artery disease Father   . Heart attack Father   . Lung cancer Sister   . Cancer Sister     lung  . Pancreatic cancer Brother   . Cancer Brother     pancreatic      Social History:  reports that he quit smoking about 6 months ago. His smoking use included Cigarettes. He has a 84 pack-year smoking history. He does not have any smokeless tobacco history on file. He reports that he does not drink alcohol or use illicit drugs.  Allergies:  Allergies  Allergen Reactions  . Diltiazem Hcl     REACTION: Severe lower extremity edema on generic cardizem  . Penicillins     Pt can tolerate cephalosporins/Pt tol Zosyn 11/01/11  . Plasma Human     Medications:  Prior to Admission:  Prescriptions prior to admission  Medication Sig Dispense Refill  . ALPRAZolam (XANAX) 0.5 MG tablet TAKE 1/2 TO ONE TAB BY MOUTH EVERY 12 HOURS PRN ANXIETY. MAY ALSO USE FOR SLEEP PRN.  30 tablet  0  . aspirin 81 MG tablet Take 81 mg by mouth daily.        . Cyanocobalamin (VITAMIN B 12 PO) Take 100 mg by mouth daily.       Marland Kitchen docusate sodium (COLACE) 100 MG capsule Take 100 mg by mouth 2 (two) times daily.        . folic acid (FOLVITE) 400 MCG tablet Take 400 mcg by mouth daily.        Marland Kitchen HYDROcodone-acetaminophen (NORCO) 10-325 MG per tablet Take 1 tablet by mouth every 6 (six) hours as needed for pain.  75 tablet  0  . lidocaine-prilocaine (EMLA) cream Ad lib.      . metoprolol (LOPRESSOR) 50 MG tablet Take 50 mg by mouth 2 (two) times daily.      . mirtazapine (REMERON) 15 MG tablet Take 1 tablet (15 mg total) by mouth at bedtime.  30 tablet  5  . ondansetron (ZOFRAN) 8 MG tablet Take by mouth every 8 (eight) hours as needed. For nausea      . pantoprazole (PROTONIX) 40 MG tablet Take 1 tablet (40 mg total) by mouth daily.  30 tablet  2  . polyethylene glycol (MIRALAX / GLYCOLAX) packet Take 17 g by mouth at bedtime.      . prochlorperazine (COMPAZINE) 10 MG tablet Take 10 mg by mouth every 6 (six) hours as needed. Nausea       . traMADol (ULTRAM) 50 MG tablet Take 1 tablet (50 mg total) by mouth every 6 (six) hours as needed for pain.  100 tablet  0  . mirtazapine (REMERON)  15 MG tablet Take 1 tablet (15 mg total) by mouth at bedtime.  30 tablet  2   Scheduled:   . enoxaparin  40 mg Subcutaneous Q24H  . metoprolol tartrate  50 mg Oral BID  . DISCONTD: enoxaparin  40 mg Subcutaneous Q24H   Continuous:   . sodium chloride 100 mL/hr at 03/24/12 0555  . TPN (CLINIMIX) +/- additives     And  . fat emulsion    . TPN (CLINIMIX) +/- additives 50 mL/hr at 03/23/12 1720  .  DISCONTD: sodium chloride 100 mL/hr (03/23/12 1104)  . DISCONTD: sodium chloride    . DISCONTD: fat emulsion 250 mL (03/22/12 1854)  . DISCONTD: TPN (CLINIMIX) +/- additives 40 mL/hr at 03/22/12 1854  . DISCONTD: TPN (CLINIMIX) +/- additives     UJW:JXBJYNWGNFAOZ (DILAUDID) injection, ondansetron (ZOFRAN) IV, ondansetron, DISCONTD: butamben-tetracaine-benzocaine, DISCONTD: fentaNYL, DISCONTD:  HYDROmorphone (DILAUDID) injection, DISCONTD:  HYDROmorphone (DILAUDID) injection, DISCONTD: midazolam, DISCONTD: ondansetron (ZOFRAN) IV, DISCONTD: ondansetron Anti-infectives    None      Results for orders placed during the hospital encounter of 03/22/12 (from the past 48 hour(s))  PREALBUMIN     Status: Abnormal   Collection Time   03/22/12  1:10 PM      Component Value Range Comment   Prealbumin 13.0 (*) 17.0 - 34.0 (mg/dL)   GLUCOSE, CAPILLARY     Status: Normal   Collection Time   03/22/12  2:49 PM      Component Value Range Comment   Glucose-Capillary 78  70 - 99 (mg/dL)   CBC     Status: Abnormal   Collection Time   03/22/12  3:10 PM      Component Value Range Comment   WBC 7.0  4.0 - 10.5 (K/uL)    RBC 3.45 (*) 4.22 - 5.81 (MIL/uL)    Hemoglobin 11.4 (*) 13.0 - 17.0 (g/dL)    HCT 30.8 (*) 65.7 - 52.0 (%)    MCV 98.3  78.0 - 100.0 (fL)    MCH 33.0  26.0 - 34.0 (pg)    MCHC 33.6  30.0 - 36.0 (g/dL)    RDW 84.6 (*) 96.2 - 15.5 (%)    Platelets 210  150 - 400 (K/uL)   CREATININE, SERUM     Status: Abnormal   Collection Time   03/22/12  3:10 PM      Component Value Range Comment    Creatinine, Ser 1.09  0.50 - 1.35 (mg/dL)    GFR calc non Af Amer 66 (*) >90 (mL/min)    GFR calc Af Amer 76 (*) >90 (mL/min)   GLUCOSE, CAPILLARY     Status: Abnormal   Collection Time   03/22/12 10:45 PM      Component Value Range Comment   Glucose-Capillary 103 (*) 70 - 99 (mg/dL)    Comment 1 Notify RN     PREALBUMIN     Status: Abnormal   Collection Time   03/23/12  4:23 AM      Component Value Range Comment   Prealbumin 12.0 (*) 17.0 - 34.0 (mg/dL)   MAGNESIUM     Status: Normal   Collection Time   03/23/12  4:23 AM      Component Value Range Comment   Magnesium 2.1  1.5 - 2.5 (mg/dL)   CHOLESTEROL, TOTAL     Status: Normal   Collection Time   03/23/12  4:23 AM      Component Value Range Comment   Cholesterol 105  0 - 200 (mg/dL)   TRIGLYCERIDES     Status: Normal   Collection Time   03/23/12  4:23 AM      Component Value Range Comment   Triglycerides 89  <150 (mg/dL)   COMPREHENSIVE METABOLIC PANEL     Status: Abnormal   Collection Time   03/23/12  4:23 AM      Component Value Range Comment   Sodium 133 (*) 135 - 145 (mEq/L)    Potassium 3.5  3.5 - 5.1 (mEq/L)    Chloride  101  96 - 112 (mEq/L)    CO2 25  19 - 32 (mEq/L)    Glucose, Bld 111 (*) 70 - 99 (mg/dL)    BUN 19  6 - 23 (mg/dL)    Creatinine, Ser 1.19  0.50 - 1.35 (mg/dL)    Calcium 8.4  8.4 - 10.5 (mg/dL)    Total Protein 6.4  6.0 - 8.3 (g/dL)    Albumin 2.6 (*) 3.5 - 5.2 (g/dL)    AST 79 (*) 0 - 37 (U/L)    ALT 81 (*) 0 - 53 (U/L)    Alkaline Phosphatase 205 (*) 39 - 117 (U/L)    Total Bilirubin 0.3  0.3 - 1.2 (mg/dL)    GFR calc non Af Amer 81 (*) >90 (mL/min)    GFR calc Af Amer >90  >90 (mL/min)   CBC     Status: Abnormal   Collection Time   03/23/12  4:23 AM      Component Value Range Comment   WBC 6.6  4.0 - 10.5 (K/uL)    RBC 3.31 (*) 4.22 - 5.81 (MIL/uL)    Hemoglobin 10.9 (*) 13.0 - 17.0 (g/dL)    HCT 14.7 (*) 82.9 - 52.0 (%)    MCV 96.4  78.0 - 100.0 (fL)    MCH 32.9  26.0 - 34.0 (pg)     MCHC 34.2  30.0 - 36.0 (g/dL)    RDW 56.2 (*) 13.0 - 15.5 (%)    Platelets 188  150 - 400 (K/uL)   SURGICAL PCR SCREEN     Status: Normal   Collection Time   03/23/12  8:05 AM      Component Value Range Comment   MRSA, PCR NEGATIVE  NEGATIVE     Staphylococcus aureus NEGATIVE  NEGATIVE    MAGNESIUM     Status: Normal   Collection Time   03/24/12  4:21 AM      Component Value Range Comment   Magnesium 1.8  1.5 - 2.5 (mg/dL)   PHOSPHORUS     Status: Normal   Collection Time   03/24/12  4:21 AM      Component Value Range Comment   Phosphorus 2.9  2.3 - 4.6 (mg/dL)   CBC     Status: Abnormal   Collection Time   03/24/12  4:21 AM      Component Value Range Comment   WBC 7.1  4.0 - 10.5 (K/uL)    RBC 3.43 (*) 4.22 - 5.81 (MIL/uL)    Hemoglobin 11.2 (*) 13.0 - 17.0 (g/dL)    HCT 86.5 (*) 78.4 - 52.0 (%)    MCV 97.1  78.0 - 100.0 (fL)    MCH 32.7  26.0 - 34.0 (pg)    MCHC 33.6  30.0 - 36.0 (g/dL)    RDW 69.6 (*) 29.5 - 15.5 (%)    Platelets 181  150 - 400 (K/uL)   DIFFERENTIAL     Status: Abnormal   Collection Time   03/24/12  4:21 AM      Component Value Range Comment   Neutrophils Relative 76  43 - 77 (%)    Neutro Abs 5.4  1.7 - 7.7 (K/uL)    Lymphocytes Relative 9 (*) 12 - 46 (%)    Lymphs Abs 0.6 (*) 0.7 - 4.0 (K/uL)    Monocytes Relative 8  3 - 12 (%)    Monocytes Absolute 0.6  0.1 - 1.0 (K/uL)    Eosinophils Relative 7 (*)  0 - 5 (%)    Eosinophils Absolute 0.5  0.0 - 0.7 (K/uL)    Basophils Relative 0  0 - 1 (%)    Basophils Absolute 0.0  0.0 - 0.1 (K/uL)   CHOLESTEROL, TOTAL     Status: Normal   Collection Time   03/24/12  4:21 AM      Component Value Range Comment   Cholesterol 108  0 - 200 (mg/dL)   TRIGLYCERIDES     Status: Normal   Collection Time   03/24/12  4:21 AM      Component Value Range Comment   Triglycerides 54  <150 (mg/dL)   PREALBUMIN     Status: Abnormal   Collection Time   03/24/12  4:21 AM      Component Value Range Comment   Prealbumin 10.4 (*) 17.0 -  34.0 (mg/dL)   COMPREHENSIVE METABOLIC PANEL     Status: Abnormal   Collection Time   03/24/12  4:21 AM      Component Value Range Comment   Sodium 135  135 - 145 (mEq/L)    Potassium 4.2  3.5 - 5.1 (mEq/L)    Chloride 103  96 - 112 (mEq/L)    CO2 26  19 - 32 (mEq/L)    Glucose, Bld 128 (*) 70 - 99 (mg/dL)    BUN 15  6 - 23 (mg/dL)    Creatinine, Ser 1.61  0.50 - 1.35 (mg/dL)    Calcium 8.5  8.4 - 10.5 (mg/dL)    Total Protein 6.5  6.0 - 8.3 (g/dL)    Albumin 2.5 (*) 3.5 - 5.2 (g/dL)    AST 64 (*) 0 - 37 (U/L)    ALT 77 (*) 0 - 53 (U/L)    Alkaline Phosphatase 209 (*) 39 - 117 (U/L)    Total Bilirubin 0.3  0.3 - 1.2 (mg/dL)    GFR calc non Af Amer 82 (*) >90 (mL/min)    GFR calc Af Amer >90  >90 (mL/min)     No results found.  Review of Systems  Constitutional: Positive for weight loss. Negative for fever, chills, malaise/fatigue and diaphoresis.  Eyes: Negative.   Respiratory: Negative.   Cardiovascular: Negative.   Gastrointestinal: Positive for heartburn (can't lie down, describes a burning feeling after he eats in stomach and throat.), nausea, vomiting and abdominal pain (after eating.). Negative for diarrhea, constipation and blood in stool.  Genitourinary: Negative.   Musculoskeletal: Negative.   Neurological: Negative.  Negative for weakness.  Endo/Heme/Allergies: Bruises/bleeds easily.  Psychiatric/Behavioral: Negative.    Blood pressure 123/66, pulse 109, temperature 97.6 F (36.4 C), temperature source Oral, resp. rate 18, height 5\' 10"  (1.778 m), weight 83.037 kg (183 lb 1 oz), SpO2 95.00%. Physical Exam  Constitutional: He is oriented to person, place, and time. He appears well-developed and well-nourished. No distress.       Lost 30 lbs with original cancer last year.  Actually gained most of it back.  9 lb. Wt loss since 03/05/12  HENT:  Head: Normocephalic and atraumatic.  Nose: Nose normal.  Eyes: Conjunctivae and EOM are normal. Pupils are equal, round, and  reactive to light. Right eye exhibits discharge. Left eye exhibits no discharge. No scleral icterus.  Neck: Normal range of motion. No JVD present. No tracheal deviation present. No thyromegaly present.  Cardiovascular: Regular rhythm, normal heart sounds and intact distal pulses.  Exam reveals no friction rub.   No murmur heard.      Tachycardic  HR about 120  when I saw him.  He does not feel palpations  Respiratory: Effort normal and breath sounds normal. No respiratory distress. He has no wheezes. He has no rales. He exhibits no tenderness.       Some ronchi at bases  GI: Soft. Bowel sounds are normal. He exhibits no distension and no mass. There is no tenderness. There is no rebound and no guarding.  Musculoskeletal: Normal range of motion. He exhibits edema.  Lymphadenopathy:    He has no cervical adenopathy.  Neurological: He is alert and oriented to person, place, and time. No cranial nerve deficit. Coordination normal.  Skin: Skin is warm and dry. No rash noted. He is not diaphoretic. No erythema.  Psychiatric: He has a normal mood and affect. His behavior is normal. Judgment and thought content normal.    Assessment/Plan: 1. His reduction in bulky retroperitoneal lymph nodes ory of stage IV colon cancer with metastasis status post laparoscopic cholecystectomy laparoscopic converted to open right colectomy with removal of omental mass 06/08/2011 Dr. Abbey Chatters. 2. Chemotherapy (FOLFOX) with new right liver lobe mass, resolution of some previous hepatic density; reduction in bulky retroperitoneal lymph nodes. 3. COPD with history of 50+ years of tobacco use. About a half pack per week now. Recent hospitalization with pneumonia bilateral, and UTI. 4. Aortic aneurysm, status post aortic valve replacement and aneurysm repair, reimplantation of right coronary artery 04/16/2007 Dr. Laneta Simmers. 5. History of chronic atrial fibrillation; status post Maze procedure during surgery. Multiple DC  cardioversions 6..GI bleed bleed/diverticular bleed, supratherapeutic INR on Coumadin. Discontinued 6 2012. 7. History of prior heavy alcohol use.  Plan: Patient's been seen and evaluated by Dr. Derrell Lolling. He has known metastatic disease. The duodenal stenosis is thought to be cancer until proven otherwise. We requested a GI. Swallow to further evaluate this stricture seen on CT scan.  Although the patient has multiple issues. It was Dr. Jacinto Halim opinion that he is most likely a surgical candidate for possible gastrojejunal bypass.  He has talled with Dr.Sherrill, and DR. Allyson Sabal in Radiology, studies pending and we will follow with you.   [facility] physician assistant for Dr. Claud Kelp.   Ala Capri 03/24/2012, 12:43 PM

## 2012-03-24 NOTE — Progress Notes (Signed)
Subjective: Pain well controlled on dilaudid. Had BM. Able to take small amount of clear liquids.  Objective: Vital signs in last 24 hours: Temp:  [97.4 F (36.3 C)-98.4 F (36.9 C)] 97.6 F (36.4 C) (04/01 0500) Pulse Rate:  [85-110] 109  (04/01 1006) Resp:  [16-18] 18  (04/01 0500) BP: (123-152)/(66-83) 123/66 mmHg (04/01 1006) SpO2:  [95 %-97 %] 95 % (04/01 0500) Weight:  [83.037 kg (183 lb 1 oz)] 83.037 kg (183 lb 1 oz) (04/01 0919) Weight change:  Last BM Date: 03/22/12  Intake/Output from previous day: 03/31 0701 - 04/01 0700 In: 2342 [P.O.:360; I.V.:935; TPN:1047] Out: 750 [Urine:750]     Physical Exam: General: Alert, awake, oriented x3. HEENT: No bruits, no goiter. Heart: irregular rate and rhythm, without murmurs, rubs, gallops. Lungs: Clear to auscultation bilaterally. Abdomen: Soft,  nondistended, positive bowel sounds, tender to deep palpation of epigastric area. Extremities: No clubbing cyanosis or edema with positive pedal pulses. Neuro: Grossly intact, nonfocal.    Lab Results: Basic Metabolic Panel:  Basename 03/24/12 0421 03/23/12 0423  NA 135 133*  K 4.2 3.5  CL 103 101  CO2 26 25  GLUCOSE 128* 111*  BUN 15 19  CREATININE 0.92 0.95  CALCIUM 8.5 8.4  MG 1.8 2.1  PHOS 2.9 --   Liver Function Tests:  [facility] 03/24/12 0421 03/23/12 0423  AST 64* 79*  ALT 77* 81*  ALKPHOS 209* 205*  BILITOT 0.3 0.3  PROT 6.5 6.4  ALBUMIN 2.5* 2.6*   CBC:  Basename 03/24/12 0421 03/23/12 0423  WBC 7.1 6.6  NEUTROABS 5.4 --  HGB 11.2* 10.9*  HCT 33.3* 31.9*  MCV 97.1 96.4  PLT 181 188   CBG:  Basename 03/22/12 2245 03/22/12 1449  GLUCAP 103* 78    Studies/Results: No results found.  Medications: Scheduled Meds:    . enoxaparin  40 mg Subcutaneous Q24H  . metoprolol tartrate  50 mg Oral BID  . DISCONTD: enoxaparin  40 mg Subcutaneous Q24H   Continuous Infusions:    . sodium chloride 100 mL/hr at 03/24/12 0555  . TPN (CLINIMIX) +/-  additives     And  . fat emulsion    . TPN (CLINIMIX) +/- additives 50 mL/hr at 03/23/12 1720  . DISCONTD: sodium chloride 100 mL/hr (03/23/12 1104)  . DISCONTD: sodium chloride    . DISCONTD: fat emulsion 250 mL (03/22/12 1854)  . DISCONTD: TPN (CLINIMIX) +/- additives 40 mL/hr at 03/22/12 1854  . DISCONTD: TPN (CLINIMIX) +/- additives     PRN Meds:.HYDROmorphone (DILAUDID) injection, ondansetron (ZOFRAN) IV, ondansetron, DISCONTD: butamben-tetracaine-benzocaine, DISCONTD: fentaNYL, DISCONTD:  HYDROmorphone (DILAUDID) injection, DISCONTD:  HYDROmorphone (DILAUDID) injection, DISCONTD: midazolam, DISCONTD: ondansetron (ZOFRAN) IV, DISCONTD: ondansetron  Assessment/Plan:  Principal Problem:  *Bowel obstruction Active Problems:  COLON CANCER  Atrial fibrillation  Transaminitis  Unspecified intestinal obstruction   #1 Bowel obstruction: Likely from malignant stricture. Unable to open endoscopically. Biopsies pending. UGI today. Surgery has been consulted as well. Continue dilaudid PRN for pain control.    LOS: 2 days   [facility] Pager: [phone] 03/24/2012, 10:59 AM

## 2012-03-24 NOTE — Progress Notes (Signed)
PARENTERAL NUTRITION CONSULT NOTE - FOLLOW UP  Pharmacy Consult for TNA Indication: Duodenal Outlet Obstruction  Allergies  Allergen Reactions  . Diltiazem Hcl     REACTION: Severe lower extremity edema on generic cardizem  . Penicillins     Pt can tolerate cephalosporins/Pt tol Zosyn 11/01/11  . Plasma Human     Patient Measurements: Height: 5\' 10"  (177.8 cm) Weight: 178 lb 9.6 oz (81.012 kg) IBW/kg (Calculated) : 73  Adjusted Body Weight: 75 kg Recent weight loss: ~13 lb over 2 weeks  Vital Signs: Temp: 97.6 F (36.4 C) (04/01 0500) Temp src: Oral (04/01 0500) BP: 137/83 mmHg (04/01 0500) Pulse Rate: 107  (04/01 0500) Intake/Output from previous day: 03/31 0701 - 04/01 0700 In: 2342 [P.O.:360; I.V.:935; TPN:1047] Out: 750 [Urine:750] Intake/Output from this shift:    Labs:  Basename 03/24/12 0421 03/23/12 0423 03/22/12 1510  WBC 7.1 6.6 7.0  HGB 11.2* 10.9* 11.4*  HCT 33.3* 31.9* 33.9*  PLT 181 188 210  APTT -- -- --  INR -- -- --    Basename 03/24/12 0421 03/23/12 0423 03/22/12 1510 03/22/12 1310  NA 135 133* -- --  K 4.2 3.5 -- --  CL 103 101 -- --  CO2 26 25 -- --  GLUCOSE 128* 111* -- --  BUN 15 19 -- --  CREATININE 0.92 0.95 1.09 --  LABCREA -- -- -- --  CREAT24HRUR -- -- -- --  CALCIUM 8.5 8.4 -- --  MG 1.8 2.1 -- --  PHOS 2.9 -- -- --  PROT 6.5 6.4 -- --  ALBUMIN 2.5* 2.6* -- --  AST 64* 79* -- --  ALT 77* 81* -- --  ALKPHOS 209* 205* -- --  BILITOT 0.3 0.3 -- --  BILIDIR -- -- -- --  IBILI -- -- -- --  PREALBUMIN -- 12.0* -- 13.0*  TRIG -- 89 -- --  CHOLHDL -- -- -- --  CHOL -- 105 -- --   Estimated Creatinine Clearance: 74.9 ml/min (by C-G formula based on Cr of 0.92).   Basename 03/22/12 2245 03/22/12 1449  GLUCAP 103* 78    Insulin Requirements in the past 24 hours:  No sliding scale ordered currently, CBGs < 150s  Current Nutrition:  Clinimix E5/20 at 50 ml/hr 20% Lipids at 88ml/hr NPO, ice chips  IVF: NS at 157ml/hr  (increased per MD 3/31)  Assessment: 30 YOM with metastatic colon cancer, presents with nausea and abdominal pain, concerning for malignant bowel obstruction.  Patient reports ~13lb weight loss over the past 2 weeks and is unable to eat or drink anything without significant pain.  S/p EGD 3/31, with duodenal outlet obstruction, attempted made for dilatation with balloon but lumen re-closed after balloon deflated. GI noted will discuss plans for nutritional support with oncology and surgery.  Continuing TNA for now. Electrolytes wnl.  Pending 4/1 TG, Prealb, cholesterol. CBGs in goal <150, no SSI ordered  LFTs elevated prior to starting TNA, essentially unchanged since TNA started.  Monitor this closely. Per RN, tolerating TNA well without problems/complications.  Nutritional Goals:  RD assessment pending; for now will aim for Clinimix E5/20 18ml/hr = 108g protein (1.44g/kg/day), kcal 2380 MWF, 1900 TTSS, Avg 2107 (28kcal/kg/day)  Plan:   Increase Clinimix E 5/20 to 75 ml/hr.    Fat emulsion, MVI and trace elements MWF only due to ongoing shortage  MD increased IVF rate 3/31 after pharmacy decreased for fluid balance with increased TNA rate so will defer to MD for IVF rate  adjustment from here on out.    Continue CBGs q8h, no SSI ordered/required at this time  TNA lab panels on Mondays & Thursdays.  F/U RD recommendations, f/u GI discussion with oncology and surgery for nutritional support plans.   Geoffry Paradise, PharmD.   Pager:  [phone] 7:49 AM

## 2012-03-24 NOTE — Progress Notes (Signed)
Surgery attending followup note:  Barium upper GI shows complete obstruction in the post bulbar duodenum. This was reviewed with Dr. Audie Pinto. No contrast went through after 45 minutes in the right lateral decubitus position. The proximal stomach was distended.  The patient has a high-grade, essentially complete obstruction of the post bulbar duodenum. I am concerned this is a malignant obstruction due to recurrent colon cancer. I discussed the patient's care was Dr. Mancel Bale, who feels that he is a viable candidate for further chemotherapy once his GI tract obstruction is resolved.  I have advised the patient to undergo exploratory laparotomy and gastrojejunostomy tomorrow. He has agreed to this. I have explained the indications and details and risks of this surgery to the patient and his family. He is aware that this may be a difficult operation due to  carcinomatosis. He is aware of other risks of bleeding, infection and anastomotic complications. He is aware that there are numerous risks otherwise. All of his questions were answered. He understands these issues. He is in full agreement with this plan.   Angelia Mould. Derrell Lolling, M.D., [facility] General and Minimally invasive Surgery Breast and Colorectal Surgery Office:   [phone] Pager:   [phone]

## 2012-03-24 NOTE — Progress Notes (Signed)
   CARE MANAGEMENT NOTE 03/24/2012  Patient:  Kyle Love, Kyle Love   Account Number:  [id number]  Date Initiated:  03/24/2012  Documentation initiated by:  Lanier Clam  Subjective/Objective Assessment:   ADMITTED W/ABD PAIN.HX: COLON CA     Action/Plan:   FROM HOME W/SPOUSE.   Anticipated DC Date:  03/28/2012   Anticipated DC Plan:  HOME/SELF CARE         Choice offered to / List presented to:             Status of service:  In process, will continue to follow Medicare Important Message given?   (If response is "NO", the following Medicare IM given date fields will be blank) Date Medicare IM given:   Date Additional Medicare IM given:    Discharge Disposition:    Per UR Regulation:  Reviewed for med. necessity/level of care/duration of stay  If discussed at Long Length of Stay Meetings, dates discussed:    Comments:  03/24/12 Emiline Mancebo RN,BSN NCM [phone] GI FOLLOWING

## 2012-03-24 NOTE — Progress Notes (Signed)
IP PROGRESS NOTE  Subjective:   He was admitted over the weekend with increased obstructive symptoms. He continues to have left upper abdominal pain. No nausea at present. He had a bowel movement yesterday.  Objective: Vital signs in last 24 hours: Blood pressure 123/66, pulse 109, temperature 97.6 F (36.4 C), temperature source Oral, resp. rate 18, height 5\' 10"  (1.778 m), weight 183 lb 1 oz (83.037 kg), SpO2 95.00%.  Intake/Output from previous day: 03/31 0701 - 04/01 0700 In: 2342 [P.O.:360; I.V.:935; TPN:1047] Out: 750 [Urine:750]  Physical Exam:  Lungs: Inspiratory rhonchi at the lower posterior chest bilaterally, no respiratory distress Cardiac: Regular rate and rhythm, tachycardia Abdomen: Mildly distended, no mass, no hepatomegaly, no apparent ascites Extremities: No leg edema Lymph nodes: No cervical, supraclavicular, axillary, or inguinal lymph nodes  Portacath/PICC-without erythema  Lab Results:  Basename 03/24/12 0421 03/23/12 0423  WBC 7.1 6.6  HGB 11.2* 10.9*  HCT 33.3* 31.9*  PLT 181 188    BMET  Basename 03/24/12 0421 03/23/12 0423  NA 135 133*  K 4.2 3.5  CL 103 101  CO2 26 25  GLUCOSE 128* 111*  BUN 15 19  CREATININE 0.92 0.95  CALCIUM 8.5 8.4    Studies/Results: No results found.  Medications: I have reviewed the patient's current medications.  Assessment/Plan:  1. Metastatic colon cancer, a sending colon tumor (T3 N2BM1) with metastatic omental nodules noted at the time of a right colectomy 06/08/2011. There was initial radiologic improvement with FOLFOX chemotherapy. A restaging PET scan on 02/27/2012 revealed a single new hepatic metastasis and a new right lower lobe pulmonary nodule.  2. History of atrial fibrillation  3. History of coronary artery disease  4. COPD  5. Proximal small bowel obstruction-likely related to metastatic lymphadenopathy or carcinomatosis  6. Upper abdominal pain-? Related to the bowel obstruction versus  tumor pain  He is admitted with a proximal bowel obstruction. He underwent an upper endoscopy yesterday which confirmed extrinsic compression of the proximal small bowel. I discussed the current situation and treatment options with [name] and his wife. He had an excellent performance status prior to developing abdominal pain and nausea last week. I will consult the surgical service as he may be a candidate for a palliative bypass procedure.  We will plan to initiate salvage systemic therapy after the bowel obstruction has resolved. Chemotherapy is unlikely to result in a rapid improvement of the obstructive symptoms.  LOS: 2 days   [name]  03/24/2012, 11:31 AM

## 2012-03-24 NOTE — Progress Notes (Signed)
Patient seen, examined, and I agree with the above documentation, including the assessment and plan. 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 Juanda Chance at EGD.  There did not seem to be mucosal disease, making a compressive process more likely. Discussed with Dr. Derrell Lolling and plan will be for barium UGI to better eval this narrowing and to hopefully determine its length. May need surgical bypass.  Unclear if enteral stenting would be durable enough and difficult to know for now long this would be effective with migration or other complication. Continue TPN

## 2012-03-24 NOTE — Consult Note (Signed)
Surgical attending note:  I have interviewed and examined this patient. I agree with the assessment and plan as outlined by  Mr. Marlyne Beards, [place].  This gentleman has stage IV colon cancer. On 06/08/2011 he underwent laparoscopic cholecystectomy and open right colectomy. He had a large tumor mass adherent to the retroperitoneum just below the duodenum and multiple omental nodules. Pathology showed extensively involved radial margin and  8/11 positive nodes. Initial pathologic stage was T3, N2b, M1.  He has had recent FOLFOX chemotherapy. PET scan on 02/20/2012 showed multiple hepatic metastasis, and decrease in uptake in the retroperitoneum and mesenteric adenopathy. Right lower lobe nodules noted.  CT scan performed 03/20/2012 shows a dilated duodenal bulb and narrowed second portion, possible malignant obstruction. Liver masses.  The patient presents with a two-week history of left-sided abdominal pain, obstructive symptoms and a 10 pound weight loss.  EGD suggest extrinsic compression of the post bulbar duodenum. The scope could not be advanced past the obstruction.  Assessment: Duodenal obstruction, partial but high-grade. The concern is that this is a malignant obstruction from recurrent retroperitoneal tumor or adenopathy.  Stage IV colon cancer.  Plan: N.p.o. Except sips of water and clear liquids. TNA already initiated Thin barium upper GI to define duodenal anatomy. After upper GI I will discuss options for intervention with Dr. Rhea Belton, Dr. Truett Perna.  The patient appears to be a viable candidate for surgical intervention and a more durable bypass such as a gastrojejunostomy, if the other caregivers agree.   Angelia Mould. Derrell Lolling, M.D., [facility] General and Minimally invasive Surgery Breast and Colorectal Surgery Office:   [phone] Pager:   [phone]

## 2012-03-24 NOTE — Progress Notes (Signed)
INITIAL ADULT NUTRITION ASSESSMENT Date: 03/24/2012   Time: 9:07 AM Reason for Assessment: Consult for new TNA, nutrition risk   ASSESSMENT: Male 71 y.o.  Dx: Bowel obstruction  Hx:  Past Medical History  Diagnosis Date  . History of ETOH abuse     quit 25 yrs ago, heavy etoh 5 yrs  . Colitis   . History of GI diverticular bleed 3/11  . Rectus sheath hematoma 3/10  . Status post Maze operation for atrial fibrillation   . Aortic aneurysm   . 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 S/P colonoscopy 05/03/10    mod diverticulosis sigmoid colon, internal hemorrhoids, suspected diverticular bleed  . Diverticulosis 07/23/2008    colonoscopy by Dr Jena Gauss, hyperplastic polyp  . IDA (iron deficiency anemia)     work-up including small bowel capsule study benign  . CHF (congestive heart failure)     diastolic  . COPD (chronic obstructive pulmonary disease)   . CAD (coronary artery disease)     s/p CABG  . S/P aortic valve replacement   . Cancer right colon T3N2M1  . Weight decrease     20-30 lb in month  . Pneumonia   . History of colon polyps   . Shortness of breath   . Arthritis     bilateral neck and shoulder pain  . Atrial fibrillation    Related Meds:  Scheduled Meds:   . enoxaparin  40 mg Subcutaneous Q24H  . metoprolol tartrate  50 mg Oral BID  . DISCONTD: enoxaparin  40 mg Subcutaneous Q24H   Continuous Infusions:   . sodium chloride 100 mL/hr at 03/24/12 0555  . TPN (CLINIMIX) +/- additives     And  . fat emulsion    . TPN (CLINIMIX) +/- additives 50 mL/hr at 03/23/12 1720  . DISCONTD: sodium chloride 100 mL/hr (03/23/12 1104)  . DISCONTD: sodium chloride    . DISCONTD: fat emulsion 250 mL (03/22/12 1854)  . DISCONTD: TPN (CLINIMIX) +/- additives 40 mL/hr at 03/22/12 1854  . DISCONTD: TPN (CLINIMIX) +/- additives     PRN Meds:.HYDROmorphone (DILAUDID) injection, ondansetron (ZOFRAN) IV,  ondansetron, DISCONTD: butamben-tetracaine-benzocaine, DISCONTD: fentaNYL, DISCONTD:  HYDROmorphone (DILAUDID) injection, DISCONTD:  HYDROmorphone (DILAUDID) injection, DISCONTD: midazolam, DISCONTD: ondansetron (ZOFRAN) IV, DISCONTD: ondansetron  Ht: 5\' 10"  (177.8 cm)  Wt: 178 lb 9.6 oz (81.012 kg)  Ideal Wt: 166 lb % Ideal Wt: 107  Usual Wt: 191 lb % Usual Wt: 93  03/05/12 188 lb 1.6 oz per [facility] office progress note  Body mass index is 25.63 kg/(m^2).  Food/Nutrition Related Hx: Pt with metastatic stage 4 colon CA s/p 12 cycles of FOLFOX chemotherapy. Pt admitted with worsening nausea/abdominal pain and 13 pound unintended weight loss in the past 2 weeks. Pt reports the abdominal pain occurred about 10 minutes after eating with emesis only if he "eats enough". Pt unable to tolerate even liquids PTA. Pt had EGD yesterday which showed duodenal outlet obstruction. Pt still with thrush. Wife reports pt has not been able to eat anything for the past 2 weeks. Pt with distended abdomen. Awaiting plan to see if pt will remain on TNA versus surgical placement of jejunal feeding tube. Pt reports not drinking much of clear liquids and still has abdominal pain 2 hours after getting pain medications.   TNA: Clinimix 5/20 @ 50 ml/hr.  Lipids (20% IVFE @ 10 ml/hr), multivitamins, and  trace elements are provided 3 times weekly (MWF) due to national backorder.  Provides 1262 kcal and 60 grams protein daily (based on weekly average).  Meets 56% minimum estimated kcal and 60% minimum estimated protein needs.  Additional IVF with NS @ 100 ml/hr.  CT of abdomen/pelvis on 3/29: 1. Interval progression of liver metastases since prior study.  2. New mild upper abdominal and retroperitoneal lymphadenopathy,  consistent with metastatic disease.  3. New small left adrenal mass, consistent with metastatic  disease.  Labs:  CMP     Component Value Date/Time   NA 135 03/24/2012 0421   K 4.2  03/24/2012 0421   CL 103 03/24/2012 0421   CO2 26 03/24/2012 0421   GLUCOSE 128* 03/24/2012 0421   BUN 15 03/24/2012 0421   CREATININE 0.92 03/24/2012 0421   CALCIUM 8.5 03/24/2012 0421   PROT 6.5 03/24/2012 0421   ALBUMIN 2.5* 03/24/2012 0421   AST 64* 03/24/2012 0421   ALT 77* 03/24/2012 0421   ALKPHOS 209* 03/24/2012 0421   BILITOT 0.3 03/24/2012 0421   GFRNONAA 82* 03/24/2012 0421   GFRAA >90 03/24/2012 0421   3/30 PALB 12  CBG (last 3)   Basename 03/22/12 2245 03/22/12 1449  GLUCAP 103* 78   - Potassium, Phosphorus, Magneisum WNL - Elevated LFTs likely r/t liver mestastses  - CBGs <150    Intake/Output Summary (Last 24 hours) at 03/24/12 0932 Last data filed at 03/24/12 0538  Gross per 24 hour  Intake   2342 ml  Output    450 ml  Net   1892 ml   Last BM - 03/22/12   Diet Order: Clear Liquid   IVF:    sodium chloride Last Rate: 100 mL/hr at 03/24/12 0555  TPN (CLINIMIX) +/- additives   And   fat emulsion   TPN (CLINIMIX) +/- additives Last Rate: 50 mL/hr at 03/23/12 1720  DISCONTD: sodium chloride Last Rate: 100 mL/hr (03/23/12 1104)  DISCONTD: sodium chloride   DISCONTD: fat emulsion Last Rate: 250 mL (03/22/12 1854)  DISCONTD: TPN (CLINIMIX) +/- additives Last Rate: 40 mL/hr at 03/22/12 1854  DISCONTD: TPN [facility]) +/- additives     Estimated Nutritional Needs:   Kcal: 2250-2500 Protein: 100-125g Fluid: 2.2-2.5L  NUTRITION DIAGNOSIS: -Altered GI function (NI-1.4).  Status: Ongoing -Pt meets criteria for severe PCM of acute illness AEB 6.8% weight loss with <50% energy intake for the past 2 weeks per pt and wife report   RELATED TO: duodenal outlet obstruction  AS EVIDENCE BY: EGD and GI notes  MONITORING/EVALUATION(Goals): TNA to meet >90% of estimated nutritional needs.   EDUCATION NEEDS: -No education needs identified at this time  INTERVENTION: TNA per pharmacy. Recommend jejunal tube feeding if possible to continue to use gut. Originally thought pt at risk or  refeeding syndrome r/t pt unable to eat/drink for 2 weeks but potassium, phosphorus, and magnesium have been WNL for the past few days and CBGs<150. Awaiting nutrition support plans from MD and GI. Recommend MD to order medications for thrush as appropriate. Will monitor.   Dietitian #: [phone]  DOCUMENTATION CODES Per approved criteria  -Severe malnutrition in the context of acute illness or injury    Kyle Love 03/24/2012, 9:07 AM

## 2012-03-24 NOTE — Progress Notes (Signed)
Call placed to Dr. On call about lovenox administration tonight prior to surgery tomorrow. Dr. Andrey Campanile stated to give lovenox tonight.

## 2012-03-25 ENCOUNTER — Encounter (HOSPITAL_COMMUNITY)
Admission: EM | Disposition: A | Discharge status: Home / Self Care | Payer: Self-pay | Source: Home / Self Care | Attending: Internal Medicine

## 2012-03-25 ENCOUNTER — Inpatient Hospital Stay (HOSPITAL_COMMUNITY): Payer: Medicare Other | Admitting: Anesthesiology

## 2012-03-25 ENCOUNTER — Encounter (HOSPITAL_COMMUNITY): Payer: Self-pay | Admitting: Anesthesiology

## 2012-03-25 DIAGNOSIS — C189 Malignant neoplasm of colon, unspecified: Secondary | ICD-10-CM

## 2012-03-25 HISTORY — PX: VENTRAL HERNIA REPAIR: SHX424

## 2012-03-25 HISTORY — PX: GASTROJEJUNOSTOMY: SHX1697

## 2012-03-25 LAB — CBC
Hemoglobin: 10.9 g/dL — ABNORMAL LOW (ref 13.0–17.0)
MCH: 32.6 pg (ref 26.0–34.0)
RBC: 3.34 MIL/uL — ABNORMAL LOW (ref 4.22–5.81)

## 2012-03-25 LAB — BASIC METABOLIC PANEL
CO2: 25 mEq/L (ref 19–32)
Chloride: 103 mEq/L (ref 96–112)
Glucose, Bld: 111 mg/dL — ABNORMAL HIGH (ref 70–99)
Potassium: 4.1 mEq/L (ref 3.5–5.1)
Sodium: 135 mEq/L (ref 135–145)

## 2012-03-25 LAB — SURGICAL PCR SCREEN: Staphylococcus aureus: NEGATIVE

## 2012-03-25 SURGERY — GASTROJEJUNOSTOMY
Anesthesia: General | Site: Abdomen | Wound class: Contaminated

## 2012-03-25 MED ORDER — ONDANSETRON HCL 4 MG/2ML IJ SOLN
INTRAMUSCULAR | Status: DC | PRN
  Administered 2012-03-25: 4 mg via INTRAVENOUS

## 2012-03-25 MED ORDER — ONDANSETRON HCL 4 MG/2ML IJ SOLN: 4.0000 mg | Freq: Four times a day (QID) | INTRAMUSCULAR | Status: DC | PRN

## 2012-03-25 MED ORDER — ENOXAPARIN SODIUM 40 MG/0.4ML ~~LOC~~ SOLN
40.0000 mg | SUBCUTANEOUS | Status: DC
  Administered 2012-03-25 – 2012-04-07 (×14): 40 mg via SUBCUTANEOUS
  Filled 2012-03-25 (×15): qty 0.4

## 2012-03-25 MED ORDER — HYDROMORPHONE HCL PF 1 MG/ML IJ SOLN
1.0000 mg | INTRAMUSCULAR | Status: DC | PRN
  Administered 2012-03-25 – 2012-03-26 (×5): 1 mg via INTRAVENOUS
  Filled 2012-03-25 (×6): qty 1

## 2012-03-25 MED ORDER — GLYCOPYRROLATE 0.2 MG/ML IJ SOLN
INTRAMUSCULAR | Status: DC | PRN
  Administered 2012-03-25: .5 mg via INTRAVENOUS

## 2012-03-25 MED ORDER — CISATRACURIUM BESYLATE 2 MG/ML IV SOLN
INTRAVENOUS | Status: DC | PRN
  Administered 2012-03-25 (×2): 2 mg via INTRAVENOUS
  Administered 2012-03-25: 6 mg via INTRAVENOUS

## 2012-03-25 MED ORDER — PROPOFOL 10 MG/ML IV BOLUS
INTRAVENOUS | Status: DC | PRN
  Administered 2012-03-25: 140 mg via INTRAVENOUS

## 2012-03-25 MED ORDER — PROMETHAZINE HCL 25 MG/ML IJ SOLN: 6.2500 mg | INTRAMUSCULAR | Status: DC | PRN

## 2012-03-25 MED ORDER — NEOSTIGMINE METHYLSULFATE 1 MG/ML IJ SOLN
INTRAMUSCULAR | Status: DC | PRN
  Administered 2012-03-25: 4 mg via INTRAVENOUS

## 2012-03-25 MED ORDER — CLINIMIX E/DEXTROSE (5/20) 5 % IV SOLN
INTRAVENOUS | Status: AC
  Administered 2012-03-25: 18:00:00 via INTRAVENOUS
  Filled 2012-03-25: qty 2000

## 2012-03-25 MED ORDER — FENTANYL CITRATE 0.05 MG/ML IJ SOLN
INTRAMUSCULAR | Status: DC | PRN
  Administered 2012-03-25: 100 ug via INTRAVENOUS
  Administered 2012-03-25 (×3): 50 ug via INTRAVENOUS

## 2012-03-25 MED ORDER — SUCCINYLCHOLINE CHLORIDE 20 MG/ML IJ SOLN
INTRAMUSCULAR | Status: DC | PRN
  Administered 2012-03-25: 100 mg via INTRAVENOUS

## 2012-03-25 MED ORDER — HYDROMORPHONE HCL PF 1 MG/ML IJ SOLN
0.2500 mg | INTRAMUSCULAR | Status: DC | PRN
  Administered 2012-03-25 (×4): 0.5 mg via INTRAVENOUS

## 2012-03-25 MED ORDER — POTASSIUM CHLORIDE IN NACL 20-0.9 MEQ/L-% IV SOLN
INTRAVENOUS | Status: DC
  Administered 2012-03-25 – 2012-03-27 (×4): via INTRAVENOUS
  Filled 2012-03-25 (×9): qty 1000

## 2012-03-25 MED ORDER — ONDANSETRON HCL 4 MG PO TABS
4.0000 mg | ORAL_TABLET | Freq: Four times a day (QID) | ORAL | Status: DC | PRN
  Administered 2012-03-26: 4 mg via ORAL

## 2012-03-25 MED ORDER — CEFAZOLIN SODIUM-DEXTROSE 2-3 GM-% IV SOLR
2.0000 g | Freq: Three times a day (TID) | INTRAVENOUS | Status: AC
  Administered 2012-03-26 (×3): 2 g via INTRAVENOUS
  Filled 2012-03-25 (×3): qty 50

## 2012-03-25 MED ORDER — FAT EMULSION 20 % IV EMUL
INTRAVENOUS | Status: DC | PRN
  Administered 2012-03-25: 10 mL via INTRAVENOUS

## 2012-03-25 MED ORDER — LACTATED RINGERS IV SOLN
INTRAVENOUS | Status: DC
  Administered 2012-03-25: 1000 mL via INTRAVENOUS
  Administered 2012-03-25 (×2): via INTRAVENOUS

## 2012-03-25 MED ORDER — VITAMINS A & D EX OINT
TOPICAL_OINTMENT | CUTANEOUS | Status: AC
  Filled 2012-03-25: qty 5

## 2012-03-25 MED ORDER — HYDROMORPHONE HCL PF 1 MG/ML IJ SOLN
INTRAMUSCULAR | Status: DC | PRN
  Administered 2012-03-25: 1 mg via INTRAVENOUS
  Administered 2012-03-25 (×2): 0.5 mg via INTRAVENOUS

## 2012-03-25 MED ORDER — PHENYLEPHRINE HCL 10 MG/ML IJ SOLN
INTRAMUSCULAR | Status: DC | PRN
  Administered 2012-03-25: 80 ug via INTRAVENOUS

## 2012-03-25 SURGICAL SUPPLY — 39 items
APPLICATOR COTTON TIP 6IN STRL (MISCELLANEOUS) ×2 IMPLANT
BLADE EXTENDED COATED 6.5IN (ELECTRODE) IMPLANT
BLADE HEX COATED 2.75 (ELECTRODE) ×2 IMPLANT
CANISTER SUCTION 2500CC (MISCELLANEOUS) ×2 IMPLANT
CLOTH BEACON ORANGE TIMEOUT ST (SAFETY) ×2 IMPLANT
COVER MAYO STAND STRL (DRAPES) IMPLANT
DRAPE LAPAROSCOPIC ABDOMINAL (DRAPES) ×2 IMPLANT
DRAPE WARM FLUID 44X44 (DRAPE) IMPLANT
ELECT REM PT RETURN 9FT ADLT (ELECTROSURGICAL) ×2
ELECTRODE REM PT RTRN 9FT ADLT (ELECTROSURGICAL) ×1 IMPLANT
GLOVE BIOGEL PI IND STRL 7.0 (GLOVE) ×2 IMPLANT
GLOVE BIOGEL PI INDICATOR 7.0 (GLOVE) ×2
GLOVE EUDERMIC 7 POWDERFREE (GLOVE) ×4 IMPLANT
GOWN STRL NON-REIN LRG LVL3 (GOWN DISPOSABLE) ×4 IMPLANT
GOWN STRL REIN XL XLG (GOWN DISPOSABLE) ×4 IMPLANT
KIT BASIN OR (CUSTOM PROCEDURE TRAY) ×2 IMPLANT
LIGASURE IMPACT 36 18CM CVD LR (INSTRUMENTS) ×2 IMPLANT
NS IRRIG 1000ML POUR BTL (IV SOLUTION) ×4 IMPLANT
PACK GENERAL/GYN (CUSTOM PROCEDURE TRAY) ×2 IMPLANT
SPONGE GAUZE 4X4 12PLY (GAUZE/BANDAGES/DRESSINGS) ×2 IMPLANT
SPONGE LAP 18X18 X RAY DECT (DISPOSABLE) IMPLANT
STAPLER PROXIMATE 75MM BLUE (STAPLE) ×2 IMPLANT
STAPLER VISISTAT 35W (STAPLE) ×2 IMPLANT
SUCTION POOLE TIP (SUCTIONS) IMPLANT
SUT NOVA 1 T20/GS 25DT (SUTURE) ×4 IMPLANT
SUT PDS AB 1 CTX 36 (SUTURE) IMPLANT
SUT PDS AB 1 TP1 96 (SUTURE) ×8 IMPLANT
SUT SILK 2 0 (SUTURE) ×1
SUT SILK 2 0 SH CR/8 (SUTURE) ×6 IMPLANT
SUT SILK 2-0 18XBRD TIE 12 (SUTURE) ×1 IMPLANT
SUT SILK 3 0 (SUTURE) ×1
SUT SILK 3 0 SH CR/8 (SUTURE) ×4 IMPLANT
SUT SILK 3-0 18XBRD TIE 12 (SUTURE) ×1 IMPLANT
SUT VICRYL 2 0 18  UND BR (SUTURE)
SUT VICRYL 2 0 18 UND BR (SUTURE) IMPLANT
TAPE CLOTH SURG 6X10 WHT LF (GAUZE/BANDAGES/DRESSINGS) ×2 IMPLANT
TOWEL OR 17X26 10 PK STRL BLUE (TOWEL DISPOSABLE) ×4 IMPLANT
TRAY FOLEY CATH 14FRSI W/METER (CATHETERS) ×2 IMPLANT
YANKAUER SUCT BULB TIP NO VENT (SUCTIONS) IMPLANT

## 2012-03-25 NOTE — Progress Notes (Signed)
Subjective: Late Entry: Patient was seen earlier today. Pain well controlled on dilaudid. To OR today for bypass.  Objective: Vital signs in last 24 hours: Temp:  [98.4 F (36.9 C)-99.3 F (37.4 C)] 98.4 F (36.9 C) (04/02 1545) Pulse Rate:  [108-111] 110  (04/02 1615) Resp:  [18-24] 18  (04/02 1615) BP: (135-170)/(69-112) 158/112 mmHg (04/02 1600) SpO2:  [94 %-100 %] 100 % (04/02 1615) Weight:  [85.5 kg (188 lb 7.9 oz)] 85.5 kg (188 lb 7.9 oz) (04/02 0700) Weight change:  Last BM Date: 03/22/12  Intake/Output from previous day: 04/01 0701 - 04/02 0700 In: 0  Out: 1250 [Urine:1250] Total I/O In: 2300 [I.V.:2300] Out: 1700 [Other:1700]   Physical Exam: General: Alert, awake, oriented x3. HEENT: No bruits, no goiter. Heart: irregular rate and rhythm, without murmurs, rubs, gallops. Lungs: Clear to auscultation bilaterally. Abdomen: Soft,  nondistended, positive bowel sounds, tender to deep palpation of epigastric area. Extremities: No clubbing cyanosis or edema with positive pedal pulses. Neuro: Grossly intact, nonfocal.    Lab Results: Basic Metabolic Panel:  Basename 03/25/12 0355 03/24/12 0421 03/23/12 0423  NA 135 135 --  K 4.1 4.2 --  CL 103 103 --  CO2 25 26 --  GLUCOSE 111* 128* --  BUN 17 15 --  CREATININE 0.84 0.92 --  CALCIUM 8.6 8.5 --  MG -- 1.8 2.1  PHOS -- 2.9 --   Liver Function Tests:  Hemet Endoscopy 03/24/12 0421 03/23/12 0423  AST 64* 79*  ALT 77* 81*  ALKPHOS 209* 205*  BILITOT 0.3 0.3  PROT 6.5 6.4  ALBUMIN 2.5* 2.6*   CBC:  Basename 03/25/12 0355 03/24/12 0421  WBC 7.5 7.1  NEUTROABS -- 5.4  HGB 10.9* 11.2*  HCT 32.7* 33.3*  MCV 97.9 97.1  PLT 172 181   CBG:  Basename 03/22/12 2245  GLUCAP 103*    Studies/Results: Dg Abd Portable 1v  03/24/2012  *RADIOLOGY REPORT*  Clinical Data: Evaluate for barium.  Upper GI today  PORTABLE ABDOMEN - 1 VIEW  Comparison: 03/24/2012  Findings: Barium remains in the antrum of the stomach.   The upper GI suggests an obstruction of the duodenum.  There is now  barium in the small bowel which is nondilated.  A small amount of barium has entered the colon and rectum.  IMPRESSION: There is retained barium in the stomach.  Barium has passed into nondilated small bowel and colon suggesting partial obstruction of the duodenum.  Original Report Authenticated By: Camelia Phenes, M.D.   Varney Biles Kayleen Memos Hans Eden  03/24/2012  *RADIOLOGY REPORT*  Clinical Data:  History of recurrent carcinoma of the colon with liver metastases, evaluate for possible duodenal obstruction  UPPER GI SERIES WITH KUB  Technique:  Routine upper GI series was performed with thin barium  Fluoroscopy Time: 3.07 minutes  Comparison:  CT abdomen and pelvis of 03/21/2012  Findings: A preliminary film of the abdomen shows a large amount of gas within the colon and gaseous distention of the stomach as well. No definite distended small bowel is seen.  In view of the distended stomach, thin barium was utilized rather than water soluble contrast.  On the preliminary film there appear to be flowing osteophytes present and probable sacroiliitis most consistent with ankylosing spondylitis.  Barium was given orally and images of the esophagus were obtained. There is a diminished primary peristaltic wave present.  A small hiatal hernia is noted.  With the patient on his back, the barium collected within the fundus of  the stomach.  He was then placed in the right lateral decubitus position and monitored for up to 50 minutes.  Barium did enter the duodenal bulb, but did not enter the post bulbar duodenum where a lesion has been noted on recent CT.  IMPRESSION: The patient is obstructed immediately post bulbar.  No barium passes beyond the obstruction over 50 minutes of intermittent fluoroscopic evaluation.  Original Report Authenticated By: Juline Patch, M.D.    Medications: Scheduled Meds:    . enoxaparin  40 mg Subcutaneous Q24H  . ertapenem  1 g  Intravenous 60 min Pre-Op  . metoprolol tartrate  50 mg Oral BID  . DISCONTD: heparin  5,000 Units Subcutaneous Once   Continuous Infusions:    . sodium chloride Stopped (03/25/12 1306)  . TPN (CLINIMIX) +/- additives Stopped (03/25/12 1300)   And  . fat emulsion Stopped (03/25/12 1300)  . lactated ringers    . TPN (CLINIMIX) +/- additives 75 mL/hr (03/25/12 1358)  . TPN (CLINIMIX) +/- additives     PRN Meds:.HYDROmorphone, HYDROmorphone (DILAUDID) injection, ondansetron (ZOFRAN) IV, ondansetron, promethazine  Assessment/Plan:  Principal Problem:  *Bowel obstruction Active Problems:  COLON CANCER  Atrial fibrillation  Transaminitis  Unspecified intestinal obstruction  Duodenal stenosis   #1 Bowel obstruction: Likely from malignant stricture. Unable to open endoscopically. Biopsies pending. Surgery has decided to perform a gastrojejunostomy to bypass the area of obstruction. Patient is otherwise medically stable.   LOS: 3 days   [facility] Pager: [phone] 03/25/2012, 4:15 PM

## 2012-03-25 NOTE — Anesthesia Postprocedure Evaluation (Signed)
  Anesthesia Post-op Note  Patient: Kyle Love  Procedure(s) Performed: Procedure(s) (LRB): GASTROJEJUNOSTOMY (N/A) HERNIA REPAIR VENTRAL ADULT (N/A)  Patient Location: PACU  Anesthesia Type: General  Level of Consciousness: awake and alert   Airway and Oxygen Therapy: Patient Spontanous Breathing  Post-op Pain: mild  Post-op Assessment: Post-op Vital signs reviewed, Patient's Cardiovascular Status Stable, Respiratory Function Stable, Patent Airway and No signs of Nausea or vomiting  Post-op Vital Signs: stable  Complications: No apparent anesthesia complications

## 2012-03-25 NOTE — Progress Notes (Signed)
PARENTERAL NUTRITION CONSULT NOTE - FOLLOW UP  Pharmacy Consult for TNA Indication: Duodenal Outlet Obstruction  Allergies  Allergen Reactions  . Diltiazem Hcl     REACTION: Severe lower extremity edema on generic cardizem  . Penicillins     Pt can tolerate cephalosporins/Pt tol Zosyn 11/01/11  . Plasma Human     Patient Measurements: Height: 5\' 10"  (177.8 cm) Weight: 188 lb 7.9 oz (85.5 kg) IBW/kg (Calculated) : 73  Adjusted Body Weight: 75 kg Recent weight loss: ~13 lb over 2 weeks  Vital Signs: Temp: 99.3 F (37.4 C) (04/02 0615) Temp src: Oral (04/02 0615) BP: 135/75 mmHg (04/02 0615) Pulse Rate: 111  (04/02 0615) Intake/Output from previous day: 04/01 0701 - 04/02 0700 In: 0  Out: 1250 [Urine:1250] Intake/Output from this shift:    Labs:  [facility] 03/25/12 0355 03/24/12 0421 03/23/12 0423  WBC 7.5 7.1 6.6  HGB 10.9* 11.2* 10.9*  HCT 32.7* 33.3* 31.9*  PLT 172 181 188  APTT -- -- --  INR -- -- --    Basename 03/25/12 0355 03/24/12 0421 03/23/12 0423 03/22/12 1310  NA 135 135 133* --  K 4.1 4.2 3.5 --  CL 103 103 101 --  CO2 25 26 25  --  GLUCOSE 111* 128* 111* --  BUN 17 15 19  --  CREATININE 0.84 0.92 0.95 --  LABCREA -- -- -- --  CREAT24HRUR -- -- -- --  CALCIUM 8.6 8.5 8.4 --  MG -- 1.8 2.1 --  PHOS -- 2.9 -- --  PROT -- 6.5 6.4 --  ALBUMIN -- 2.5* 2.6* --  AST -- 64* 79* --  ALT -- 77* 81* --  ALKPHOS -- 209* 205* --  BILITOT -- 0.3 0.3 --  BILIDIR -- -- -- --  IBILI -- -- -- --  PREALBUMIN -- 10.4* 12.0* 13.0*  TRIG -- 54 89 --  CHOLHDL -- -- -- --  CHOL -- 108 105 --   Estimated Creatinine Clearance: 82.1 ml/min (by C-G formula based on Cr of 0.84).   Basename 03/22/12 2245 03/22/12 1449  GLUCAP 103* 78    Insulin Requirements in the past 24 hours:  No sliding scale ordered currently, CBGs < 150s  Current Nutrition:  Clinimix E5/20 at 75 ml/hr 20% Lipids at 21ml/hr NPO, sips of water   IVF: NS at 140ml/hr (increased per MD  3/31)  Assessment:  41 YOM with metastatic colon cancer, CT scan 3/8 with dilated duodenal bulb, possible malignant obstruction, liver masses.  EGD 3/31, with duodenal outlet obstruction, attempted made for dilatation with balloon but lumen re-closed after balloon deflated. UGI 4/1, with complete obstruction in the post bulbar duodenum, rec'd exp lap and gastrojejunostomy 4/2.   Electrolytes, cholesterol, TGs wnl.  Prealbumin down:  13 (3/30), 12 (3/31), 10.4 (4/1) LFTs elevated prior to starting TNA, essentially unchanged since TNA started.  Monitor this closely. RD recommended jejunal TFs if possible   Nutritional Goals:   RD rec 4/1: Kcal: 2250-2500, Protein: 100-125g, Fluid: 2.2-2.5L  Clinimix E5/20 20ml/hr = 108g protein (1.44g/kg/day), kcal 2380 MWF, 1900 TTSS, Avg 2107 (28kcal/kg/day)  Plan:   Continue Clinimix E 5/20 to 75 ml/hr.    Fat emulsion, MVI and trace elements MWF only due to ongoing shortage  MD increased IVF rate 3/31 after pharmacy decreased for fluid balance with increased TNA rate so will defer to MD for IVF rate adjustment from here on out.    Continue CBGs q8h, no SSI ordered/required at this time  TNA  lab panels on Mondays & Thursdays.  Geoffry Paradise, PharmD.   Pager:  [phone] 8:16 AM

## 2012-03-25 NOTE — Progress Notes (Signed)
General surgery attending note:  Subjective: Patient alert. Denies nausea or vomiting. Still has intermittent left upper quadrant pain. Feels ready for surgery. Feels that all questions are answered. Vital signs stable. Labs noted.  Objective vital signs stable. Abdomen soft. Not really tender. Tympanitic left upper quadrant.  Assessment: High-grade, essentially complete obstruction post bulbar duodenum. Considering history of stage IV colon cancer, malignant obstruction is more likely than benign obstruction.  Stage IV colon cancer. Hopefully he will be a candidate for further chemotherapy in the future, which is Dr. Daivd Council plan.  Plan: the patient will be taken to the operating room today for exploratory laparotomy, and hopefully we will be able to accomplish a gastrojejunostomy. All of his questions are answered and he agrees with this plan.   Angelia Mould. Derrell Lolling, M.D., [facility] General and Minimally invasive Surgery Breast and Colorectal Surgery Office:   [phone] Pager:   [phone]

## 2012-03-25 NOTE — Transfer of Care (Signed)
Immediate Anesthesia Transfer of Care Note  Patient: Kyle Love  Procedure(s) Performed: Procedure(s) (LRB): GASTROJEJUNOSTOMY (N/A) HERNIA REPAIR VENTRAL ADULT (N/A)  Patient Location: PACU  Anesthesia Type: General  Level of Consciousness: awake, alert  and oriented  Airway & Oxygen Therapy: Patient Spontanous Breathing and Patient connected to face mask oxygen  Post-op Assessment: Report given to PACU RN and Post -op Vital signs reviewed and stable  Post vital signs: Reviewed and stable  Complications: No apparent anesthesia complications

## 2012-03-25 NOTE — Op Note (Signed)
Patient Name:           Kyle Love   Date of Surgery:        03/25/2012  Pre op Diagnosis:      Stage IV colon cancer, malignant obstruction of post bulbar duodenum, ventral hernia  Post op Diagnosis:    same  Procedure:                 Exploratory laparotomy, retrocolic gastrojejunostomy, repair ventral incisional hernia  Surgeon:                     Angelia Mould. Derrell Lolling, M.D., FACS  Assistant:                      Gaynelle Adu, M.D.  Operative Indications:   This is a 71 year old man who was diagnosed with colon cancer one year ago. He was operated upon by Dr. Abbey Chatters and was found to have a bulky cancer of the right colon which was adherent to the right retroperitoneum. He also had multiple omental nodules at that time. He has undergone chemotherapy under the guidance of Dr. Truett Perna. Some of his metastatic disease has regressed and some new areas have emerged. He has multiple liver metastasis. He recently presented with nausea and vomiting of about 2 weeks' duration with some weight loss. He has had CT scan, endoscopy, an upper GI and all these showed what appears to be a near-complete obstruction of the post bulbar duodenum, presumably by extrinsic compression. Options for management have been discussed among the physicians and with the patient. Decision was made to proceed with surgical bypass in hopes of restoring his GI function and nutritional state. The patient is very much in favor of this. He has a ventral hernia around his umbilicus as well.  Operative Findings:       There was palpable adenopathy around the first and second portion of the duodenum and up along the  portal triad. He had some mesenteric nodules of the small bowel. The stomach felt grossly normal. The colon was somewhat distended. He had ascites which was clear yellow. He had multiple liver masses.  Procedure in Detail:          Following the induction of general endotracheal anesthesia, a Foley catheter was placed.  A nasogastric tube was placed and it drained 1700 cc of bilious fluid immediately. The abdomen was prepped and draped in a sterile fashion. Surgical time out was performed. Intravenous antibiotics were given.  Midline incision was made from below the xiphoid ultimately down and around the umbilicus so we could repair the hernia. The fascia was incised in the midline. The abdomen was entered and explored with findings as described above. Self-retaining retractor was placed.  We decided that the transverse colon was too distended for a antecolic gastrojejunostomy so we planned a retrocolic gastrojejunostomy. We identified the ligament of Treitz Treitz and reconfirmed that 3 times to be sure that we were in the proximal bowel. We marked his bowel with a Babcock clamp. We then found an avascular window in the transverse colon mesentery and divided this with the LigaSure device until we had an open area. We divided the gastrocolic omentum with the LigaSure device. We then brought the small bowel up through the opening and make to make sure that it was not twisted or deformed in any way. We created an anastomosis between the proximal jejunum and the back wall of the greater curvature of  the stomach with a GIA stapling device. The bowel looked viable. We closed the common defect with multiple interrupted sutures of 2-0 silk. We placed multiple other sutures of 3-0 silk to reinforce the staple line at critical points. The anastomosis looked good. We could palpate a large lumen. We brought the anastomosis down below the transverse colon mesentery and sutured the stomach to the transverse colon mesentery to a prevent herniation in the future. We repositioned the NG tube in the body of the stomach. We irrigated the abdomen and pelvis extensively.  We examined the midline incision and identified the hernia in the periumbilical area this was where the previous scar was. We undermined the subcutaneous tissue and mobilized  the fascia. The lower one third of the wall of fascia was closed with interrupted sutures of #1 Novofil. The upper two thirds were closed with a running suture of #1 double stranded PDS. Skin was closed with skin staples. A clean bandage was placed. The patient went to recovery room stable. There were no complications. EBL 50 cc. Counts correct.     Angelia Mould. Derrell Lolling, M.D., FACS General and Minimally Invasive Surgery Breast and Colorectal Surgery  03/25/2012 3:52 PM

## 2012-03-25 NOTE — Preoperative (Signed)
Beta Blockers   Reason not to administer Beta Blockers:Hold beta blocker due to other NPO bowel obstruction 

## 2012-03-25 NOTE — Anesthesia Preprocedure Evaluation (Addendum)
Anesthesia Evaluation  Patient identified by MRN, date of birth, ID band Patient awake    Reviewed: Allergy & Precautions, H&P , NPO status , Patient's Chart, lab work & pertinent test results  Airway Mallampati: II TM Distance: >3 FB Neck ROM: Full    Dental No notable dental hx.    Pulmonary COPD   + decreased breath sounds      Cardiovascular + CAD, + CABG, + Peripheral Vascular Disease and +CHF + dysrhythmias Atrial Fibrillation + Valvular Problems/Murmurs Rhythm:Regular Rate:Tachycardia  S/p AVR   Neuro/Psych negative neurological ROS  negative psych ROS   GI/Hepatic negative GI ROS, Neg liver ROS,   Endo/Other  negative endocrine ROS  Renal/GU negative Renal ROS  negative genitourinary   Musculoskeletal negative musculoskeletal ROS (+)   Abdominal   Peds negative pediatric ROS (+)  Hematology negative hematology ROS (+)   Anesthesia Other Findings   Reproductive/Obstetrics negative OB ROS                          Anesthesia Physical Anesthesia Plan  ASA: III  Anesthesia Plan: General   Post-op Pain Management:    Induction: Intravenous, Rapid sequence and Cricoid pressure planned  Airway Management Planned: Oral ETT  Additional Equipment:   Intra-op Plan:   Post-operative Plan: Extubation in OR and Possible Post-op intubation/ventilation  Informed Consent: I have reviewed the patients History and Physical, chart, labs and discussed the procedure including the risks, benefits and alternatives for the proposed anesthesia with the patient or authorized representative who has indicated his/her understanding and acceptance.   Dental advisory given  Plan Discussed with: CRNA  Anesthesia Plan Comments:         Anesthesia Quick Evaluation

## 2012-03-26 ENCOUNTER — Ambulatory Visit: Payer: Medicare Other | Admitting: Nurse Practitioner

## 2012-03-26 LAB — BASIC METABOLIC PANEL
CO2: 24 mEq/L (ref 19–32)
Calcium: 8.5 mg/dL (ref 8.4–10.5)
Chloride: 103 mEq/L (ref 96–112)
Sodium: 132 mEq/L — ABNORMAL LOW (ref 135–145)

## 2012-03-26 LAB — CBC
MCV: 96.3 fL (ref 78.0–100.0)
Platelets: 193 10*3/uL (ref 150–400)
RBC: 3.48 MIL/uL — ABNORMAL LOW (ref 4.22–5.81)
WBC: 9.6 10*3/uL (ref 4.0–10.5)

## 2012-03-26 MED ORDER — HYDROMORPHONE 0.3 MG/ML IV SOLN
INTRAVENOUS | Status: DC
  Administered 2012-03-26: 08:00:00 via INTRAVENOUS

## 2012-03-26 MED ORDER — DIPHENHYDRAMINE HCL 12.5 MG/5ML PO ELIX: 12.5000 mg | ORAL_SOLUTION | Freq: Four times a day (QID) | ORAL | Status: DC | PRN

## 2012-03-26 MED ORDER — NALOXONE HCL 0.4 MG/ML IJ SOLN: 0.4000 mg | INTRAMUSCULAR | Status: DC | PRN

## 2012-03-26 MED ORDER — TRACE MINERALS CR-CU-MN-SE-ZN 10-1000-500-60 MCG/ML IV SOLN
INTRAVENOUS | Status: DC
  Filled 2012-03-26 (×2): qty 2000

## 2012-03-26 MED ORDER — PANTOPRAZOLE SODIUM 40 MG IV SOLR
40.0000 mg | Freq: Two times a day (BID) | INTRAVENOUS | Status: DC
  Administered 2012-03-26 – 2012-04-02 (×15): 40 mg via INTRAVENOUS
  Filled 2012-03-26 (×18): qty 40

## 2012-03-26 MED ORDER — FAT EMULSION 20 % IV EMUL
240.0000 mL | INTRAVENOUS | Status: AC
  Administered 2012-03-26: 240 mL via INTRAVENOUS
  Filled 2012-03-26: qty 250

## 2012-03-26 MED ORDER — DIPHENHYDRAMINE HCL 50 MG/ML IJ SOLN: 12.5000 mg | Freq: Four times a day (QID) | INTRAMUSCULAR | Status: DC | PRN

## 2012-03-26 MED ORDER — SODIUM CHLORIDE 0.9 % IJ SOLN: 9.0000 mL | INTRAMUSCULAR | Status: DC | PRN

## 2012-03-26 MED ORDER — HYDROMORPHONE 0.3 MG/ML IV SOLN
INTRAVENOUS | Status: AC
  Administered 2012-03-26: 5.6 mg via INTRAVENOUS
  Filled 2012-03-26: qty 25

## 2012-03-26 MED ORDER — ONDANSETRON HCL 4 MG/2ML IJ SOLN: 4.0000 mg | Freq: Four times a day (QID) | INTRAMUSCULAR | Status: DC | PRN

## 2012-03-26 MED ORDER — FAT EMULSION 20 % IV EMUL
240.0000 mL | INTRAVENOUS | Status: DC
  Filled 2012-03-26 (×2): qty 250

## 2012-03-26 MED ORDER — ACETAMINOPHEN 10 MG/ML IV SOLN
1000.0000 mg | Freq: Four times a day (QID) | INTRAVENOUS | Status: AC
  Administered 2012-03-26 (×4): 1000 mg via INTRAVENOUS
  Filled 2012-03-26 (×5): qty 100

## 2012-03-26 MED ORDER — TRACE MINERALS CR-CU-MN-SE-ZN 10-1000-500-60 MCG/ML IV SOLN
INTRAVENOUS | Status: AC
  Administered 2012-03-26: 17:00:00 via INTRAVENOUS
  Filled 2012-03-26: qty 2160

## 2012-03-26 MED ORDER — HYDROMORPHONE 0.3 MG/ML IV SOLN
INTRAVENOUS | Status: DC
  Administered 2012-03-26: 2.1 mg via INTRAVENOUS
  Administered 2012-03-26: 18:00:00 via INTRAVENOUS
  Administered 2012-03-27: 3.3 mg via INTRAVENOUS
  Administered 2012-03-27: 1.8 mg via INTRAVENOUS

## 2012-03-26 MED ORDER — METOPROLOL TARTRATE 1 MG/ML IV SOLN
5.0000 mg | Freq: Four times a day (QID) | INTRAVENOUS | Status: DC
  Administered 2012-03-26 – 2012-03-27 (×4): 5 mg via INTRAVENOUS
  Filled 2012-03-26 (×8): qty 5

## 2012-03-26 MED ORDER — HYDROMORPHONE 0.3 MG/ML IV SOLN
INTRAVENOUS | Status: AC
  Administered 2012-03-27: 234 mg via INTRAVENOUS
  Filled 2012-03-26: qty 25

## 2012-03-26 NOTE — Progress Notes (Signed)
1 Day Post-Op  Subjective: Alert. Having a lot of pain. Heart rate 115. Denies shortness of breath. Denies chest pain. Does complain of reflux symptoms.  NG drain bilious fluid, moderate to high volumes. Excellent urine output. Lab work looks fine,  WBC 7500, hemoglobin 10.9. Normal electrolytes.  Objective: Vital signs in last 24 hours: Temp:  [97.5 F (36.4 C)-99.1 F (37.3 C)] 97.5 F (36.4 C) (04/03 0612) Pulse Rate:  [110-119] 118  (04/03 0612) Resp:  [17-24] 18  (04/03 0612) BP: (137-170)/(69-89) 137/78 mmHg (04/03 0612) SpO2:  [94 %-100 %] 97 % (04/03 0612) Weight:  [188 lb 4.4 oz (85.4 kg)] 188 lb 4.4 oz (85.4 kg) (04/03 0500) Last BM Date: 03/22/12  Intake/Output from previous day: 04/02 0701 - 04/03 0700 In: 2500 [I.V.:2500] Out: 2450 [Urine:750] Intake/Output this shift:    General appearance: moderate distress and awake and alert with normal mental status. In moderate distress from abdominal pain. Skin warm and dry. Resp: clear to auscultation bilaterally GI: the abdomen is soft and appropriately tender. I don't perceive any peritoneal signs. Wound looks fine. No bowel sounds. Slightly distended.  Lab Results:  Results for orders placed during the hospital encounter of 03/22/12 (from the past 24 hour(s))  SURGICAL PCR SCREEN     Status: Normal   Collection Time   03/25/12 11:32 AM      Component Value Range   MRSA, PCR NEGATIVE  NEGATIVE    Staphylococcus aureus NEGATIVE  NEGATIVE   TYPE AND SCREEN     Status: Normal (Preliminary result)   Collection Time   03/25/12  1:30 PM      Component Value Range   ABO/RH(D) O POS     Antibody Screen NEG     Sample Expiration 03/28/2012     Unit Number [id number]     Blood Component Type RED CELLS,LR     Unit division 00     Status of Unit ALLOCATED     Transfusion Status OK TO TRANSFUSE     Crossmatch Result Compatible     Unit Number [id number]     Blood Component Type RED CELLS,LR     Unit division 00     Status of  Unit ALLOCATED     Transfusion Status OK TO TRANSFUSE     Crossmatch Result Compatible    BASIC METABOLIC PANEL     Status: Abnormal   Collection Time   03/26/12  4:28 AM      Component Value Range   Sodium 132 (*) 135 - 145 (mEq/L)   Potassium 4.0  3.5 - 5.1 (mEq/L)   Chloride 103  96 - 112 (mEq/L)   CO2 24  19 - 32 (mEq/L)   Glucose, Bld 130 (*) 70 - 99 (mg/dL)   BUN 18  6 - 23 (mg/dL)   Creatinine, Ser 5.62  0.50 - 1.35 (mg/dL)   Calcium 8.5  8.4 - 13.0 (mg/dL)   GFR calc non Af Amer 89 (*) >90 (mL/min)   GFR calc Af Amer >90  >90 (mL/min)  CBC     Status: Abnormal   Collection Time   03/26/12  4:28 AM      Component Value Range   WBC 9.6  4.0 - 10.5 (K/uL)   RBC 3.48 (*) 4.22 - 5.81 (MIL/uL)   Hemoglobin 11.6 (*) 13.0 - 17.0 (g/dL)   HCT 86.5 (*) 78.4 - 52.0 (%)   MCV 96.3  78.0 - 100.0 (fL)   MCH 33.3  26.0 - 34.0 (pg)   MCHC 34.6  30.0 - 36.0 (g/dL)   RDW 95.6  21.3 - 08.6 (%)   Platelets 193  150 - 400 (K/uL)     Studies/Results: @RISRSLT24 @     . acetaminophen  1,000 mg Intravenous Q6H  .  ceFAZolin (ANCEF) IV  2 g Intravenous Q8H  . enoxaparin  40 mg Subcutaneous Q24H  . ertapenem  1 g Intravenous 60 min Pre-Op  . metoprolol  5 mg Intravenous Q6H  . pantoprazole (PROTONIX) IV  40 mg Intravenous Q12H  . vitamin A & D      . DISCONTD: enoxaparin  40 mg Subcutaneous Q24H  . DISCONTD: metoprolol tartrate  50 mg Oral BID     Assessment/Plan: s/p Procedure(s): GASTROJEJUNOSTOMY HERNIA REPAIR VENTRAL ADULT   POD #1. Stable but pain control inadequate. Will switch to PCA Dilaudid, full dose and add every 6 H. Tylenol IV.Marland Kitchen  Coronary artery disease and tachycardia. I have started IV Lopressor at this time.  Out of bed to chair if tolerated.  Check lab work again tomorrow.  Management of medical problems by [facility] hospitalist.    LOS: 4 days    Angelia Mould. Derrell Lolling, M.D., [facility] General and Minimally invasive Surgery Breast  and Colorectal Surgery Office:   [phone] Pager:   [phone]  03/26/2012  . .prob

## 2012-03-26 NOTE — Progress Notes (Addendum)
PARENTERAL NUTRITION CONSULT NOTE - FOLLOW UP  Pharmacy Consult for TNA Indication: Duodenal Outlet Obstruction  Allergies  Allergen Reactions  . Diltiazem Hcl     REACTION: Severe lower extremity edema on generic cardizem  . Penicillins     Pt can tolerate cephalosporins/Pt tol Zosyn 11/01/11  . Plasma Human     Patient Measurements: Height: 5\' 10"  (177.8 cm) Weight: 188 lb 4.4 oz (85.4 kg) IBW/kg (Calculated) : 73  Adjusted Body Weight: 75 kg Recent weight loss: ~13 lb over 2 weeks  Vital Signs: Temp: 97.5 F (36.4 C) (04/03 0612) Temp src: Oral (04/03 0612) BP: 137/78 mmHg (04/03 0612) Pulse Rate: 118  (04/03 0612) Intake/Output from previous day: 04/02 0701 - 04/03 0700 In: 2500 [I.V.:2500] Out: 2450 [Urine:750] Intake/Output from this shift:    Labs:  [facility] 03/26/12 0428 03/25/12 0355 03/24/12 0421  WBC 9.6 7.5 7.1  HGB 11.6* 10.9* 11.2*  HCT 33.5* 32.7* 33.3*  PLT 193 172 181  APTT -- -- --  INR -- -- --    Basename 03/26/12 0428 03/25/12 0355 03/24/12 0421  NA 132* 135 135  K 4.0 4.1 4.2  CL 103 103 103  CO2 24 25 26   GLUCOSE 130* 111* 128*  BUN 18 17 15   CREATININE 0.77 0.84 0.92  LABCREA -- -- --  CREAT24HRUR -- -- --  CALCIUM 8.5 8.6 8.5  MG -- -- 1.8  PHOS -- -- 2.9  PROT -- -- 6.5  ALBUMIN -- -- 2.5*  AST -- -- 64*  ALT -- -- 77*  ALKPHOS -- -- 209*  BILITOT -- -- 0.3  BILIDIR -- -- --  IBILI -- -- --  PREALBUMIN -- -- 10.4*  TRIG -- -- 54  CHOLHDL -- -- --  CHOL -- -- 108   Estimated Creatinine Clearance: 86.2 ml/min (by C-G formula based on Cr of 0.77).   Basename 03/22/12 2245 03/22/12 1449  GLUCAP 103* 78    Insulin Requirements in the past 24 hours:  No sliding scale ordered currently, CBGs < 150s  Current Nutrition:  Clinimix E5/20 at 75 ml/hr 20% Lipids at 71ml/hr on MWF  NPO   IVF: NS at 165ml/hr (increased per MD 4/2)  Assessment:  21 YOM with metastatic colon cancer, CT scan 3/8 with dilated duodenal  bulb, possible malignant obstruction, liver masses.  EGD 3/31, with duodenal outlet obstruction, attempted made for dilatation with balloon but lumen re-closed after balloon deflated. UGI 4/1, with complete obstruction in the post bulbar duodenum.  Patient is s/p exp lap and gastrojejunostomy 4/2.   Na slightly low but unable to adjust in TNA bag.  Other electrolytes, cholesterol, TGs wnl.  Prealbumin down:  13 (3/30), 12 (3/31), 10.4 (4/1) LFTs elevated prior to starting TNA, essentially unchanged since TNA started.  Monitor this closely.  Nutritional Goals:   RD rec 4/1: Kcal: 2250-2500, Protein: 100-125g, Fluid: 2.2-2.5L  Clinimix E5/20 13ml/hr = 108g protein (1.44g/kg/day), kcal 2380 MWF, 1900 TTSS, Avg 2107 (28kcal/kg/day)  Plan:   Advance TNA to goal with Clinimix E 5/20 at 90 ml/hr.    Fat emulsion, MVI and trace elements MWF only due to ongoing shortage  MD increased IVF rate 3/31 and 4/2 after pharmacy decreased for fluid balance with increased TNA rate so will defer to MD for IVF rate adjustment from here on out.    Continue CBGs q8h, no SSI ordered/required at this time  TNA lab panels on Mondays & Thursdays.  Geoffry Paradise, PharmD.  Pager:  [phone] 8:11 AM

## 2012-03-26 NOTE — Progress Notes (Signed)
Patient ID: Kyle Love, male   DOB: [birth year], 71 y.o.   MRN: [id number]  Subjective: No events overnight. Patient denies chest pain, shortness of breath, abdominal pain at the moment since he was started on PCA pump.  Objective:  Vital signs in last 24 hours:  Filed Vitals:   03/26/12 0500 03/26/12 0612 03/26/12 0813 03/26/12 1003  BP:  137/78  140/86  Pulse:  118  120  Temp:  97.5 F (36.4 C)  98 F (36.7 C)  TempSrc:  Oral  Oral  Resp:  18 27 20   Height:      Weight: 85.4 kg (188 lb 4.4 oz)     SpO2:  97% 98% 96%    Intake/Output from previous day:   Intake/Output Summary (Last 24 hours) at 03/26/12 1132 Last data filed at 03/26/12 1007  Gross per 24 hour  Intake   2500 ml  Output   3550 ml  Net  -1050 ml    Physical Exam: General: Alert, awake, oriented x3, in no acute distress. HEENT: No bruits, no goiter. Moist mucous membranes, no scleral icterus, no conjunctival pallor. Heart: Irregular rate and rhythm, tachycardic, no murmurs, rubs, gallops. Lungs: Clear to auscultation bilaterally. No wheezing, no rhonchi, no rales.  Abdomen: Soft, tender in epigastric area, nondistended, positive bowel sounds. NG drain bilious fluid. Extremities: No clubbing or cyanosis, no pitting edema,  positive pedal pulses. Neuro: Grossly nonfocal.  Lab Results:  Lab 03/26/12 0428 03/25/12 0355 03/24/12 0421 03/23/12 0423 03/22/12 1510 03/20/12 1256  WBC 9.6 7.5 7.1 6.6 7.0 --  HGB 11.6* 10.9* 11.2* 10.9* 11.4* --  HCT 33.5* 32.7* 33.3* 31.9* 33.9* --  PLT 193 172 181 188 210 --  MCV 96.3 97.9 97.1 96.4 98.3 --  MCH 33.3 32.6 32.7 32.9 33.0 --  MCHC 34.6 33.3 33.6 34.2 33.6 --  RDW 15.5 15.8* 15.7* 16.0* 16.4* --  LYMPHSABS -- -- 0.6* -- -- 0.6*  MONOABS -- -- 0.6 -- -- 1.0*  EOSABS -- -- 0.5 -- -- 0.1  BASOSABS -- -- 0.0 -- -- 0.0  BANDABS -- -- -- -- -- --    Lab 03/26/12 0428 03/25/12 0355 03/24/12 0421 03/23/12 0423 03/22/12 1510 03/20/12 1256  NA 132* 135 135  133* -- 133*  K 4.0 4.1 4.2 3.5 -- 3.8  CL 103 103 103 101 -- 96  CO2 24 25 26 25  -- 29  GLUCOSE 130* 111* 128* 111* -- 132*  BUN 18 17 15 19  -- 27*  CREATININE 0.77 0.84 0.92 0.95 1.09 --  CALCIUM 8.5 8.6 8.5 8.4 -- 9.7  MG -- -- 1.8 2.1 -- --   Studies/Results: Dg Abd Portable 1v 03/24/2012  IMPRESSION: There is retained barium in the stomach.  Barium has passed into nondilated small bowel and colon suggesting partial obstruction of the duodenum.    Dg Ugi W/kub 03/24/2012  IMPRESSION: The patient is obstructed immediately post bulbar.  No barium passes beyond the obstruction over 50 minutes of intermittent fluoroscopic evaluation.   Medications: Scheduled Meds:   . acetaminophen  1,000 mg Intravenous Q6H  .  ceFAZolin (ANCEF) IV  2 g Intravenous Q8H  . enoxaparin  40 mg Subcutaneous Q24H  . ertapenem  1 g Intravenous 60 min Pre-Op  . HYDROmorphone PCA 0.3 mg/mL   Intravenous Q4H  . HYDROmorphone PCA 0.3 mg/mL      . metoprolol  5 mg Intravenous Q6H  . pantoprazole (PROTONIX) IV  40 mg Intravenous Q12H  .  vitamin A & D      . DISCONTD: enoxaparin  40 mg Subcutaneous Q24H  . DISCONTD: metoprolol tartrate  50 mg Oral BID   Continuous Infusions:   . 0.9 % NaCl with KCl 20 mEq / L 125 mL/hr at 03/26/12 0258  . TPN (CLINIMIX) +/- additives Stopped (03/25/12 1300)   And  . fat emulsion Stopped (03/25/12 1300)  . TPN (CLINIMIX) +/- additives     And  . fat emulsion    . TPN (CLINIMIX) +/- additives 75 mL/hr at 03/25/12 1748  . DISCONTD: sodium chloride Stopped (03/25/12 1306)  . DISCONTD: fat emulsion    . DISCONTD: lactated ringers    . DISCONTD: TPN (CLINIMIX) +/- additives     PRN Meds:.diphenhydrAMINE, diphenhydrAMINE, naloxone, ondansetron (ZOFRAN) IV, ondansetron (ZOFRAN) IV, ondansetron (ZOFRAN) IV, ondansetron, ondansetron, sodium chloride, DISCONTD: HYDROmorphone, DISCONTD:  HYDROmorphone (DILAUDID) injection, DISCONTD: HYDROmorphone, DISCONTD:  promethazine  Assessment/Plan:  Principal Problem:  *Bowel obstruction - s/p gastrojejunostomy, post op day #1 - pt now PCA pump and denies pain at the moment - continue to follow up on surgery recommendations  Active Problems:  Atrial fibrillation - tachycardic - please note pt was off metoprolol and at home he is on 50 mg BID - will continue IV form for now and switch to PO once pt tolerating   COLON CANCER - no additional recommendations from oncology service   Duodenal stenosis - s/p gastrojejunostomy, post op day #1   EDUCATION - test results and diagnostic studies were discussed with patient and pt's family who was present at the bedside (wife) - patient and family have verbalized the understanding - questions were answered at the bedside and contact information was provided for additional questions or concerns   LOS: 4 days   MAGICK-Karole Oo 03/26/2012, 11:32 AM  [facility] HOSPITALIST Pager: [phone]

## 2012-03-26 NOTE — Progress Notes (Signed)
The operative findings are noted. He is recovering from the gastrojejunostomy procedure.  I will be out until 03/31/2012. Please call oncology as needed. We will schedule outpatient followup to plan salvage systemic therapy.  Mancel Bale, M.D.

## 2012-03-27 ENCOUNTER — Inpatient Hospital Stay (HOSPITAL_COMMUNITY): Payer: Medicare Other

## 2012-03-27 ENCOUNTER — Other Ambulatory Visit: Payer: Self-pay

## 2012-03-27 DIAGNOSIS — I4892 Unspecified atrial flutter: Secondary | ICD-10-CM

## 2012-03-27 DIAGNOSIS — E876 Hypokalemia: Secondary | ICD-10-CM

## 2012-03-27 LAB — COMPREHENSIVE METABOLIC PANEL
ALT: 32 U/L (ref 0–53)
AST: 36 U/L (ref 0–37)
Albumin: 2 g/dL — ABNORMAL LOW (ref 3.5–5.2)
Alkaline Phosphatase: 156 U/L — ABNORMAL HIGH (ref 39–117)
BUN: 21 mg/dL (ref 6–23)
Chloride: 104 mEq/L (ref 96–112)
Potassium: 4.6 mEq/L (ref 3.5–5.1)
Sodium: 134 mEq/L — ABNORMAL LOW (ref 135–145)
Total Bilirubin: 0.3 mg/dL (ref 0.3–1.2)
Total Protein: 6 g/dL (ref 6.0–8.3)

## 2012-03-27 LAB — CARDIAC PANEL(CRET KIN+CKTOT+MB+TROPI): Troponin I: <0.3 ng/mL (ref <0.30)

## 2012-03-27 LAB — GLUCOSE, CAPILLARY

## 2012-03-27 MED ORDER — DILTIAZEM HCL 25 MG/5ML IV SOLN: 10.0000 mg | Freq: Once | INTRAVENOUS | Status: DC

## 2012-03-27 MED ORDER — SODIUM CHLORIDE 0.9 % IJ SOLN
10.0000 mL | INTRAMUSCULAR | Status: DC | PRN
Start: 2012-03-27 — End: 2012-04-08
  Administered 2012-04-07 – 2012-04-08 (×3): 10 mL

## 2012-03-27 MED ORDER — ONDANSETRON HCL 4 MG/2ML IJ SOLN
4.0000 mg | Freq: Four times a day (QID) | INTRAMUSCULAR | Status: DC | PRN
  Administered 2012-03-31 – 2012-04-04 (×6): 4 mg via INTRAVENOUS
  Filled 2012-03-27 (×6): qty 2

## 2012-03-27 MED ORDER — HYDROMORPHONE 0.3 MG/ML IV SOLN
INTRAVENOUS | Status: DC
  Administered 2012-03-27: 1.5 mg via INTRAVENOUS
  Administered 2012-03-27 (×2): via INTRAVENOUS
  Administered 2012-03-27: 1.8 mg via INTRAVENOUS
  Administered 2012-03-28: 0.9 mg via INTRAVENOUS
  Administered 2012-03-28: 2.4 mg via INTRAVENOUS
  Administered 2012-03-28: 1.2 mg via INTRAVENOUS
  Administered 2012-03-28: 2.4 mg via INTRAVENOUS
  Administered 2012-03-28: 1.5 mg via INTRAVENOUS
  Administered 2012-03-28 – 2012-03-29 (×2): via INTRAVENOUS
  Administered 2012-03-29: 1.8 mg via INTRAVENOUS
  Administered 2012-03-29 (×2): 0.9 mg via INTRAVENOUS
  Administered 2012-03-29: 1.5 mg via INTRAVENOUS
  Administered 2012-03-30: 2.7 mg via INTRAVENOUS
  Administered 2012-03-30: 1.8 mg via INTRAVENOUS
  Administered 2012-03-30: 0.9 mg via INTRAVENOUS
  Administered 2012-03-30: 1.5 mg via INTRAVENOUS
  Administered 2012-03-30: 0.3 mg via INTRAVENOUS
  Administered 2012-03-31: 1.2 mg via INTRAVENOUS
  Administered 2012-03-31: 0.9 mg via INTRAVENOUS
  Administered 2012-03-31: 1.2 mg via INTRAVENOUS
  Administered 2012-04-01: 1.5 mg via INTRAVENOUS
  Administered 2012-04-01: 0.3 mg via INTRAVENOUS
  Administered 2012-04-01: 07:00:00 via INTRAVENOUS
  Administered 2012-04-01: 0.6 mg via INTRAVENOUS
  Administered 2012-04-01: 2.4 mg via INTRAVENOUS
  Administered 2012-04-02: 1.2 mg via INTRAVENOUS
  Administered 2012-04-02: 0.3 mg via INTRAVENOUS
  Administered 2012-04-02: 0.6 mg via INTRAVENOUS
  Administered 2012-04-02: 18:00:00 via INTRAVENOUS
  Administered 2012-04-02: 0.3 mg via INTRAVENOUS
  Administered 2012-04-02: 0.9 mg via INTRAVENOUS
  Administered 2012-04-02: 1.5 mg via INTRAVENOUS
  Administered 2012-04-03: 0.9 mg via INTRAVENOUS
  Administered 2012-04-03: 0.6 mg via INTRAVENOUS
  Administered 2012-04-03: 0.3 mg via INTRAVENOUS
  Administered 2012-04-03: 0.6 mg via INTRAVENOUS
  Administered 2012-04-03: 0.3 mg via INTRAVENOUS
  Administered 2012-04-04: 0.9 mg via INTRAVENOUS
  Administered 2012-04-04 (×3): 0.3 mg via INTRAVENOUS
  Administered 2012-04-05: 1.2 mg via INTRAVENOUS

## 2012-03-27 MED ORDER — HYDROMORPHONE 0.3 MG/ML IV SOLN: INTRAVENOUS | Status: DC

## 2012-03-27 MED ORDER — HYDROMORPHONE 0.3 MG/ML IV SOLN
INTRAVENOUS | Status: AC
  Filled 2012-03-27: qty 25

## 2012-03-27 MED ORDER — CLINIMIX E/DEXTROSE (5/20) 5 % IV SOLN
INTRAVENOUS | Status: AC
  Administered 2012-03-27: 18:00:00 via INTRAVENOUS
  Filled 2012-03-27: qty 2160

## 2012-03-27 MED ORDER — SODIUM CHLORIDE 0.9 % IJ SOLN: 9.0000 mL | INTRAMUSCULAR | Status: DC | PRN

## 2012-03-27 MED ORDER — DIGOXIN 0.25 MG/ML IJ SOLN
0.1250 mg | Freq: Every day | INTRAMUSCULAR | Status: DC
  Administered 2012-03-28 – 2012-03-31 (×4): 0.125 mg via INTRAVENOUS
  Filled 2012-03-27 (×10): qty 0.5

## 2012-03-27 MED ORDER — NALOXONE HCL 0.4 MG/ML IJ SOLN: 0.4000 mg | INTRAMUSCULAR | Status: DC | PRN

## 2012-03-27 MED ORDER — METOPROLOL TARTRATE 1 MG/ML IV SOLN
10.0000 mg | Freq: Four times a day (QID) | INTRAVENOUS | Status: DC
  Administered 2012-03-27 (×2): 10 mg via INTRAVENOUS
  Filled 2012-03-27 (×9): qty 10

## 2012-03-27 MED ORDER — DIGOXIN 0.25 MG/ML IJ SOLN
0.2500 mg | Freq: Four times a day (QID) | INTRAMUSCULAR | Status: AC
  Administered 2012-03-27 (×2): 0.25 mg via INTRAVENOUS
  Filled 2012-03-27 (×3): qty 1

## 2012-03-27 MED ORDER — SODIUM CHLORIDE 0.9 % IV SOLN
INTRAVENOUS | Status: DC
  Administered 2012-04-02: 23:00:00 via INTRAVENOUS

## 2012-03-27 MED ORDER — MOXIFLOXACIN HCL IN NACL 400 MG/250ML IV SOLN
400.0000 mg | INTRAVENOUS | Status: DC
  Administered 2012-03-27 – 2012-04-01 (×6): 400 mg via INTRAVENOUS
  Filled 2012-03-27 (×7): qty 250

## 2012-03-27 MED ORDER — DIPHENHYDRAMINE HCL 12.5 MG/5ML PO ELIX: 12.5000 mg | ORAL_SOLUTION | Freq: Four times a day (QID) | ORAL | Status: DC | PRN

## 2012-03-27 MED ORDER — METOPROLOL TARTRATE 1 MG/ML IV SOLN
10.0000 mg | INTRAVENOUS | Status: DC
  Administered 2012-03-27 – 2012-04-01 (×26): 10 mg via INTRAVENOUS
  Filled 2012-03-27 (×22): qty 10
  Filled 2012-03-27: qty 5
  Filled 2012-03-27 (×10): qty 10

## 2012-03-27 MED ORDER — DIPHENHYDRAMINE HCL 50 MG/ML IJ SOLN: 12.5000 mg | Freq: Four times a day (QID) | INTRAMUSCULAR | Status: DC | PRN

## 2012-03-27 NOTE — Progress Notes (Signed)
Pt noted fidgeting with NG tube several times through the night. He was redirected and educated each time. Extra tape was used to secure and re-enforce NG-tube to Pt nose. Tube is in place and currently on intermittent suction.

## 2012-03-27 NOTE — Progress Notes (Signed)
Pt HR converted from ST to A-fib. EKG completed, MD on call paged, orders received. Will continue to monitor. Kyle Love A

## 2012-03-27 NOTE — Progress Notes (Signed)
2 Days Post-Op   Subjective: Pain control is better with IV, all and PCA Dilaudid. He still has abdominal pain which he admits a somewhat stressful.  He has been out of bed and has actually ambulated in the hall.  EMG output bilious, 1800 cc last 24 hours, but subsiding. No stool or flatus. Excellent urine output.  Objective: Vital signs in last 24 hours: Temp:  [97.5 F (36.4 C)-98 F (36.7 C)] 97.7 F (36.5 C) (04/04 0557) Pulse Rate:  [117-120] 117  (04/04 0557) Resp:  [16-27] 21  (04/04 0727) BP: (111-142)/(71-86) 142/79 mmHg (04/04 0557) SpO2:  [94 %-99 %] 94 % (04/04 0727) FiO2 (%):  [29 %-94 %] 94 % (04/04 0727) Last BM Date: 03/22/12  Intake/Output from previous day: 04/03 0701 - 04/04 0700 In: 0  Out: 3275 [Urine:1475; Emesis/NG output:1800] Intake/Output this shift: Total I/O In: -  Out: 200 [Emesis/NG output:200]  General appearance: alert. Middle status cleared. Mild distress  from abdominal pain. No respiratory problems. Resp: coarse breath sounds and slight wheezing at bases. No rhonchi. Moves air fairly well. GI: distended but soft.  Midline wound okay.  Lab Results:  Results for orders placed during the hospital encounter of 03/22/12 (from the past 24 hour(s))  COMPREHENSIVE METABOLIC PANEL     Status: Abnormal   Collection Time   03/27/12  4:25 AM      Component Value Range   Sodium 134 (*) 135 - 145 (mEq/L)   Potassium 4.6  3.5 - 5.1 (mEq/L)   Chloride 104  96 - 112 (mEq/L)   CO2 25  19 - 32 (mEq/L)   Glucose, Bld 95  70 - 99 (mg/dL)   BUN 21  6 - 23 (mg/dL)   Creatinine, Ser 9.52  0.50 - 1.35 (mg/dL)   Calcium 8.6  8.4 - 84.1 (mg/dL)   Total Protein 6.0  6.0 - 8.3 (g/dL)   Albumin 2.0 (*) 3.5 - 5.2 (g/dL)   AST 36  0 - 37 (U/L)   ALT 32  0 - 53 (U/L)   Alkaline Phosphatase 156 (*) 39 - 117 (U/L)   Total Bilirubin 0.3  0.3 - 1.2 (mg/dL)   GFR calc non Af Amer 86 (*) >90 (mL/min)   GFR calc Af Amer >90  >90 (mL/min)  MAGNESIUM     Status: Normal   Collection Time   03/27/12  4:25 AM      Component Value Range   Magnesium 1.8  1.5 - 2.5 (mg/dL)  PHOSPHORUS     Status: Normal   Collection Time   03/27/12  4:25 AM      Component Value Range   Phosphorus 3.0  2.3 - 4.6 (mg/dL)     Studies/Results: @RISRSLT24 @     . acetaminophen  1,000 mg Intravenous Q6H  .  ceFAZolin (ANCEF) IV  2 g Intravenous Q8H  . enoxaparin  40 mg Subcutaneous Q24H  . HYDROmorphone PCA 0.3 mg/mL   Intravenous Q4H  . HYDROmorphone PCA 0.3 mg/mL      . metoprolol  5 mg Intravenous Q6H  . pantoprazole (PROTONIX) IV  40 mg Intravenous Q12H  . DISCONTD: HYDROmorphone PCA 0.3 mg/mL   Intravenous Q4H     Assessment/Plan: s/p Procedure(s): GASTROJEJUNOSTOMY HERNIA REPAIR VENTRAL ADULT  POD #2. Stable. Pain control improved. Expected ileus, continue NG tube. Discontinue Foley Increase ambulation Incentive spirometry  Coronary artery disease and tachycardia area. Continue IV Lopressor  Continue TNA support.  Measurement of numerous medical problems but  [facility] hospitalist.    LOS: 5 days    Mal Asher M. Derrell Lolling, M.D., [facility] General and Minimally invasive Surgery Breast and Colorectal Surgery Office:   [phone] Pager:   [phone]  03/27/2012  . .prob

## 2012-03-27 NOTE — Progress Notes (Signed)
PARENTERAL NUTRITION CONSULT NOTE - FOLLOW UP  Pharmacy Consult for TNA Indication: Duodenal Outlet Obstruction  Allergies  Allergen Reactions  . Diltiazem Hcl     REACTION: Severe lower extremity edema on generic cardizem  . Penicillins     Pt can tolerate cephalosporins/Pt tol Zosyn 11/01/11  . Plasma Human     Patient Measurements: Height: 5\' 10"  (177.8 cm) Weight: 188 lb 4.4 oz (85.4 kg) IBW/kg (Calculated) : 73  Adjusted Body Weight: 75 kg Recent weight loss: ~13 lb over 2 weeks  Vital Signs: Temp: 97.7 F (36.5 C) (04/04 0557) Temp src: Oral (04/04 0557) BP: 142/79 mmHg (04/04 0557) Pulse Rate: 117  (04/04 0557) Intake/Output from previous day: 04/03 0701 - 04/04 0700 In: 0  Out: 3275 [Urine:1475; Emesis/NG output:1800] Intake/Output from this shift: Total I/O In: -  Out: 200 [Emesis/NG output:200]  Labs:  [facility] 03/26/12 0428 03/25/12 0355  WBC 9.6 7.5  HGB 11.6* 10.9*  HCT 33.5* 32.7*  PLT 193 172  APTT -- --  INR -- --    Basename 03/27/12 0425 03/26/12 0428 03/25/12 0355  NA 134* 132* 135  K 4.6 4.0 4.1  CL 104 103 103  CO2 25 24 25   GLUCOSE 95 130* 111*  BUN 21 18 17   CREATININE 0.83 0.77 0.84  LABCREA -- -- --  CREAT24HRUR -- -- --  CALCIUM 8.6 8.5 8.6  MG 1.8 -- --  PHOS 3.0 -- --  PROT 6.0 -- --  ALBUMIN 2.0* -- --  AST 36 -- --  ALT 32 -- --  ALKPHOS 156* -- --  BILITOT 0.3 -- --  BILIDIR -- -- --  IBILI -- -- --  PREALBUMIN -- -- --  TRIG -- -- --  CHOLHDL -- -- --  CHOL -- -- --   Estimated Creatinine Clearance: 83.1 ml/min (by C-G formula based on Cr of 0.83).   Basename 03/22/12 2245 03/22/12 1449  GLUCAP 103* 78    Insulin Requirements in the past 24 hours:  No sliding scale ordered currently, CBGs < 150s  Current Nutrition:  Clinimix E5/20 at 75 ml/hr 20% Lipids at 86ml/hr on MWF  NPO   IVF: NS at 50ml/hr (increased per MD 4/2)  Assessment:  35 YOM with metastatic colon cancer, CT scan 3/8 with dilated  duodenal bulb, possible malignant obstruction, liver masses.  EGD 3/31, with duodenal outlet obstruction, attempted made for dilatation with balloon but lumen re-closed after balloon deflated. UGI 4/1, with complete obstruction in the post bulbar duodenum.  Patient is s/p exp lap and gastrojejunostomy 4/2.    Rate advanced to goal 4/3. Na slightly low but unable to adjust in TNA bag.  K+ at upper end, monitor for now.  Other electrolytes, cholesterol, TGs wnl.  Prealbumin down:  13 (3/30), 12 (3/31), 10.4 (4/1) LFTs elevated prior to starting TNA, now improving.  Monitor this closely.  Nutritional Goals:   RD rec 4/1: Kcal: 2250-2500, Protein: 100-125g, Fluid: 2.2-2.5L  Clinimix E5/20 72ml/hr = 108g protein (1.44g/kg/day), kcal 2380 MWF, 1900 TTSS, Avg 2107 (28kcal/kg/day)  Plan:   Continue TNA to goal with Clinimix E 5/20 at 90 ml/hr.    Fat emulsion, MVI and trace elements MWF only due to ongoing shortage  Defer to MD for IVF rate adjustment from here on out: decreased to 25ml/hr today  Continue CBGs q8h, no SSI ordered/required at this time  TNA lab panels on Mondays & Thursdays.   Loralee Pacas, PharmD, BCPS Pager: [phone] 03/27/2012, 8:03 AM

## 2012-03-27 NOTE — Progress Notes (Signed)
Patient ID: Kyle Love, male   DOB: May 11, 1941, 71 y.o.   MRN: [id number]  Subjective: No events overnight. Patient denies chest pain, shortness of breath. He reports persistent abdominal pain. He has been tachycardic since last night per nurse's report.  Objective:  Vital signs in last 24 hours:  Filed Vitals:   03/27/12 0344 03/27/12 0557 03/27/12 0724 03/27/12 0727  BP:  142/79    Pulse:  117    Temp:  97.7 F (36.5 C)    TempSrc:  Oral    Resp: 21 20 21 21   Height:      Weight:      SpO2: 97% 97% 94% 94%    Intake/Output from previous day:   Intake/Output Summary (Last 24 hours) at 03/27/12 0832 Last data filed at 03/27/12 0727  Gross per 24 hour  Intake      0 ml  Output   3475 ml  Net  -3475 ml    Physical Exam: General: Alert, awake, oriented x3, in mild distress due to abdominal pain. HEENT: No bruits, no goiter. Moist mucous membranes, no scleral icterus, no conjunctival pallor. Heart: Regular rhythm but tachycardic, S1/S2 +, no murmurs, rubs, gallops. Lungs: Somewhat diminished breath sounds at the right base, mild end expiratory wheezing, no rhonchi, no rales.  Abdomen: Soft, nontender, slightly distended, positive bowel sounds. NGT placed with bilious draining noted Extremities: No clubbing or cyanosis, no pitting edema,  positive pedal pulses. Neuro: Grossly nonfocal.  Lab Results:  Lab 03/26/12 0428 03/25/12 0355 03/24/12 0421 03/23/12 0423 03/22/12 1510 03/20/12 1256  WBC 9.6 7.5 7.1 6.6 7.0 --  HGB 11.6* 10.9* 11.2* 10.9* 11.4* --  HCT 33.5* 32.7* 33.3* 31.9* 33.9* --  PLT 193 172 181 188 210 --    Lab 03/27/12 0425 03/26/12 0428 03/25/12 0355 03/24/12 0421 03/23/12 0423  NA 134* 132* 135 135 133*  K 4.6 4.0 4.1 4.2 3.5  CL 104 103 103 103 101  CO2 25 24 25 26 25   GLUCOSE 95 130* 111* 128* 111*  BUN 21 18 17 15 19   CREATININE 0.83 0.77 0.84 0.92 0.95  CALCIUM 8.6 8.5 8.6 8.5 8.4  MG 1.8 -- -- 1.8 2.1   Studies/Results: No results  found.  Medications: Scheduled Meds:   . acetaminophen  1,000 mg Intravenous Q6H  .  ceFAZolin (ANCEF) IV  2 g Intravenous Q8H  . enoxaparin  40 mg Subcutaneous Q24H  . HYDROmorphone PCA 0.3 mg/mL   Intravenous Q4H  . HYDROmorphone PCA 0.3 mg/mL      . metoprolol  10 mg Intravenous Q6H  . pantoprazole (PROTONIX) IV  40 mg Intravenous Q12H  . DISCONTD: diltiazem  10 mg Intravenous Once  . DISCONTD: HYDROmorphone PCA 0.3 mg/mL   Intravenous Q4H  . DISCONTD: metoprolol  5 mg Intravenous Q6H   Continuous Infusions:   . sodium chloride    . TPN (CLINIMIX) +/- additives 90 mL/hr at 03/26/12 1712   And  . fat emulsion 240 mL (03/26/12 1711)  . TPN (CLINIMIX) +/- additives 75 mL/hr at 03/25/12 1748  . TPN (CLINIMIX) +/- additives    . DISCONTD: 0.9 % NaCl with KCl 20 mEq / L 125 mL/hr at 03/27/12 0743   PRN Meds:.diphenhydrAMINE, diphenhydrAMINE, naloxone, ondansetron (ZOFRAN) IV, ondansetron, sodium chloride, DISCONTD: diphenhydrAMINE, DISCONTD: diphenhydrAMINE, DISCONTD: naloxone, DISCONTD: ondansetron (ZOFRAN) IV, DISCONTD: ondansetron (ZOFRAN) IV, DISCONTD: ondansetron, DISCONTD: sodium chloride  Assessment/Plan:  Principal Problem:  *Bowel obstruction  - s/p gastrojejunostomy, post op day #  2  - pt continues to use PCA pump and denies pain at the moment  - continue to follow up on surgery recommendations  - NGT with 200 cc output overnight  Active Problems:  Atrial fibrillation  - tachycardic but regular rhythm - on 12 lead EKG, sinus tachycardia  - will increase the dose of Metoprolol IV to 10 mg Q6 hours - pt is allergic to Diltiazem  - transfer to telemetry, cycle CE's x 3 sets - obtain CXR given findings on physical exam - cardiology consult  COLON CANCER  - no additional recommendations from oncology service   Duodenal stenosis  - s/p gastrojejunostomy, post op day #2    ANEMIA-IRON DEFICIENCY - Hg/Hct remain stable and at pt's baseline   Hyponatremia -  stable - BMP in AM   EDUCATION  - test results and diagnostic studies were discussed with patient and pt's family who was present at the bedside (wife)  - patient and family have verbalized the understanding  - questions were answered at the bedside and contact information was provided for additional questions or concerns    LOS: 5 days   MAGICK-Odean Fester 03/27/2012, 8:32 AM  [facility] Pager: [phone]

## 2012-03-27 NOTE — Progress Notes (Signed)
Nutrition Follow-up  Diet Order: NPO  TNA: Clinimix E 5/20 @ 90 ml/hr.  Lipids (20% IVFE @ 10 ml/hr), multivitamins, and trace elements are provided 3 times weekly (MWF) due to national backorder.  Provides 2106 kcal and 108 grams protein daily (based on weekly average).  Meets 94% minimum estimated kcal and 108% minimum estimated protein needs.  Additional IVF with NS @ 50 ml/hr.  - Pt had UGI on 4/1 showed that the patient is obstructed immediately post bulbar. No barium passes beyond the obstruction over 50 minutes of intermittent fluoroscopic evaluation. Surgeon mentioned concern for possible malignant obstruction r/t recurrent colon CA. POD# 2 gastrojejunostomy with ventral hernia repair. Postop pt c/o reflux symptoms and persistent abdominal pain. NGT put out overnight. Cardiology consulted for pt's tachycardia this morning. Pt getting transferred to telemetry today. Met with pt who stated his abdominal pain is better than yesterday, no other nutrition concerns.   Meds: Scheduled Meds:   . acetaminophen  1,000 mg Intravenous Q6H  .  ceFAZolin (ANCEF) IV  2 g Intravenous Q8H  . enoxaparin  40 mg Subcutaneous Q24H  . HYDROmorphone PCA 0.3 mg/mL   Intravenous Q4H  . HYDROmorphone PCA 0.3 mg/mL      . metoprolol  10 mg Intravenous Q6H  . pantoprazole (PROTONIX) IV  40 mg Intravenous Q12H  . DISCONTD: diltiazem  10 mg Intravenous Once  . DISCONTD: HYDROmorphone PCA 0.3 mg/mL   Intravenous Q4H  . DISCONTD: metoprolol  5 mg Intravenous Q6H   Continuous Infusions:   . sodium chloride    . TPN (CLINIMIX) +/- additives 90 mL/hr at 03/26/12 1712   And  . fat emulsion 240 mL (03/26/12 1711)  . TPN (CLINIMIX) +/- additives 75 mL/hr at 03/25/12 1748  . TPN (CLINIMIX) +/- additives    . DISCONTD: 0.9 % NaCl with KCl 20 mEq / L 125 mL/hr at 03/27/12 0743   PRN Meds:.diphenhydrAMINE, diphenhydrAMINE, naloxone, ondansetron (ZOFRAN) IV, ondansetron, sodium chloride, DISCONTD:  diphenhydrAMINE, DISCONTD: diphenhydrAMINE, DISCONTD: naloxone, DISCONTD: ondansetron (ZOFRAN) IV, DISCONTD: ondansetron (ZOFRAN) IV, DISCONTD: ondansetron, DISCONTD: sodium chloride  Labs:  CMP     Component Value Date/Time   NA 134* 03/27/2012 0425   K 4.6 03/27/2012 0425   CL 104 03/27/2012 0425   CO2 25 03/27/2012 0425   GLUCOSE 95 03/27/2012 0425   BUN 21 03/27/2012 0425   CREATININE 0.83 03/27/2012 0425   CALCIUM 8.6 03/27/2012 0425   PROT 6.0 03/27/2012 0425   ALBUMIN 2.0* 03/27/2012 0425   AST 36 03/27/2012 0425   ALT 32 03/27/2012 0425   ALKPHOS 156* 03/27/2012 0425   BILITOT 0.3 03/27/2012 0425   GFRNONAA 86* 03/27/2012 0425   GFRAA >90 03/27/2012 0425   CBG (last 3)  No results found for this basename: GLUCAP:3 in the last 72 hours  - PALB down from 12 on 3/31 to 10.4 on 4/1 - Sodium slightly low - Pharmacy continuing to monitor LFTs   Intake/Output Summary (Last 24 hours) at 03/27/12 0911 Last data filed at 03/27/12 0727  Gross per 24 hour  Intake      0 ml  Output   2575 ml  Net  -2575 ml   NGT - dark green output so far today Last BM - 3/30  Weight Status:   4/1 183 lb 1 oz 4/3 188 lb 4.4 oz - Unclear what is causing this gain, may be fluid related  Re-estimated needs:  2250-2500 calories 120-145g protein  Nutrition Dx: Altered  GI function - ongoing  Goal: TNA to meet >90% of estimated nutritional needs - currently met, however re-estimated protein needs to higher r/t recent GI surgery   Intervention: TNA per pharmacy. Diet advancement per MD.   Monitor: Weights, labs, BM, NGT output   Marshall Cork Pager #: [phone]

## 2012-03-27 NOTE — Plan of Care (Signed)
Problem: Altered GI Function ([place]-1.4) Goal: Food and/or nutrient delivery Individualized approach for food/nutrient provision.  Outcome: Not Progressing POD# 2 gastrojejunostomy

## 2012-03-27 NOTE — Consult Note (Addendum)
CARDIOLOGY CONSULT NOTE  Patient ID: Kyle Love MRN: [id number] DOB/AGE: 12/26/1940 71 y.o.  Admit date: 03/22/2012 Referring Physician: Dr Izola Price Primary Physician: Dr Phillips Odor Primary Cardiologist: Dr [name] Reason for Consultation: Tachycardia  HPI: This is a 71 year old gentleman who we are asked to see in consultation for evaluation of tachycardia. The patient has a long history of atrial fibrillation and aortic valve disease status post bioprosthetic aortic valve replacement in 2008. He has undergone chemotherapy for metastatic colon cancer. He was last seen by Dr. [name] in the office 01/21/2012. At that time it was recommended that he stay off of Coumadin in the setting of his metastatic cancer and ongoing chemotherapy. The patient has had abdominal pain and weight loss and he was diagnosed with a partial bowel obstruction. He has required surgical gastrojejunostomy. This morning it was noted that his heart rate was elevated. The patient has had some improvement in his pain control. He still has discomfort throughout the abdominal region. He denies chest pain or pressure. He denies dyspnea, but his wife thinks he is more short of breath. He denies orthopnea, PND, or leg swelling. The patient has been receiving metoprolol 5 mg IV every 6 hours and this was increased to 10 mg every 6 hours this morning. Of note, he has no history of TIA or stroke.  Past Medical History  Diagnosis Date  . History of ETOH abuse     quit 25 yrs ago, heavy etoh 5 yrs  . Colitis   . History of GI diverticular bleed 3/11  . Rectus sheath hematoma 3/10  . Status post Maze operation for atrial fibrillation   . Aortic aneurysm   . 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 S/P colonoscopy 05/03/10    mod diverticulosis sigmoid colon, internal hemorrhoids, suspected diverticular bleed  . Diverticulosis 07/23/2008    colonoscopy by Dr  Jena Gauss, hyperplastic polyp  . IDA (iron deficiency anemia)     work-up including small bowel capsule study benign  . CHF (congestive heart failure)     diastolic  . COPD (chronic obstructive pulmonary disease)   . CAD (coronary artery disease)     s/p CABG  . S/P aortic valve replacement   . Cancer right colon [id number]  . Weight decrease     20-30 lb in month  . Pneumonia   . History of colon polyps   . Shortness of breath   . Arthritis     bilateral neck and shoulder pain  . Atrial fibrillation      Past Surgical History  Procedure Date  . Maze   . Aortic valve replacement 04/16/2007  . Hemorrhoid surgery 1997  . Esophagogastroduodenoscopy 05/14/11    Dr Elnoria Howard reportedly normal  . Tonsillectomy   . Colonoscopy 05/17/11, 07/23/08, 07/05/06, 10/10/05    at [facility]: diverticlosis,internal hemorrhiods  . Portacath placement 07/16/2011  . Colon surgery 06/09/11    LAP. RIGHT COLECTOMY  . Cholecystectomy 06/09/11  . Cardiac catheterization 04/03/2007  . Esophagogastroduodenoscopy 03/23/2012    Procedure: ESOPHAGOGASTRODUODENOSCOPY (EGD);  Surgeon: Hart Carwin, MD;  Location: [facility];  Service: Endoscopy;  Laterality: N/A;     Family History  Problem Relation Age of Onset  . Lung cancer Mother   . Cancer Mother     lung  . Coronary artery disease Father   . Heart attack Father   . Lung cancer Sister   . Cancer  Sister     lung  . Pancreatic cancer Brother   . Cancer Brother     pancreatic    Social History: History   Social History  . Marital Status: Married    Spouse Name: N/A    Number of Children: N/A  . Years of Education: N/A   Occupational History  . Not on file.   Social History Main Topics  . Smoking status: Former Smoker -- 1.5 packs/day for 56 years    Types: Cigarettes    Quit date: 08/25/2011  . Smokeless tobacco: Not on file   Comment: recently restarted  . Alcohol Use: No     history etoh abuse, quit 25 yrs ago  . Drug Use: No  . Sexually Active:  No   Other Topics Concern  . Not on file   Social History Narrative  . No narrative on file     Prescriptions prior to admission  Medication Sig Dispense Refill  . ALPRAZolam (XANAX) 0.5 MG tablet TAKE 1/2 TO ONE TAB BY MOUTH EVERY 12 HOURS PRN ANXIETY. MAY ALSO USE FOR SLEEP PRN.  30 tablet  0  . aspirin 81 MG tablet Take 81 mg by mouth daily.        . Cyanocobalamin (VITAMIN B 12 PO) Take 100 mg by mouth daily.       Marland Kitchen docusate sodium (COLACE) 100 MG capsule Take 100 mg by mouth 2 (two) times daily.        . folic acid (FOLVITE) 400 MCG tablet Take 400 mcg by mouth daily.        Marland Kitchen HYDROcodone-acetaminophen (NORCO) 10-325 MG per tablet Take 1 tablet by mouth every 6 (six) hours as needed for pain.  75 tablet  0  . lidocaine-prilocaine (EMLA) cream Ad lib.      . metoprolol (LOPRESSOR) 50 MG tablet Take 50 mg by mouth 2 (two) times daily.      . mirtazapine (REMERON) 15 MG tablet Take 1 tablet (15 mg total) by mouth at bedtime.  30 tablet  5  . ondansetron (ZOFRAN) 8 MG tablet Take by mouth every 8 (eight) hours as needed. For nausea      . pantoprazole (PROTONIX) 40 MG tablet Take 1 tablet (40 mg total) by mouth daily.  30 tablet  2  . polyethylene glycol (MIRALAX / GLYCOLAX) packet Take 17 g by mouth at bedtime.      . prochlorperazine (COMPAZINE) 10 MG tablet Take 10 mg by mouth every 6 (six) hours as needed. Nausea       . traMADol (ULTRAM) 50 MG tablet Take 1 tablet (50 mg total) by mouth every 6 (six) hours as needed for pain.  100 tablet  0  . mirtazapine (REMERON) 15 MG tablet Take 1 tablet (15 mg total) by mouth at bedtime.  30 tablet  2    ROS: General: no fevers/chills/night sweats Eyes: no blurry vision, diplopia, or amaurosis ENT: no sore throat or hearing loss Resp: no cough, wheezing, or hemoptysis CV: See history of present illness GI: Positive for nausea, abdominal pain, and weight loss  GU: no dysuria, frequency, or hematuria Skin: no rash Neuro: no headache,  numbness, tingling, or weakness of extremities Musculoskeletal: no joint pain or swelling Heme: no bleeding, DVT, or easy bruising Endo: no polydipsia or polyuria   Physical Exam: Blood pressure 142/79, pulse 117, temperature 97.7 F (36.5 C), temperature source Oral, resp. rate 21, height 5\' 10"  (1.778 m), weight 85.4 kg (188  lb 4.4 oz), SpO2 94.00%.  Pt is alert and oriented, in no distress. HEENT: normal Neck: JVP normal. Carotid upstrokes normal without bruits. No thyromegaly. Lungs: equal expansion, decreased breath sounds but clear bilaterally CV: Apex is discrete and nondisplaced, tachycardic and regular with a soft systolic ejection murmur  Abd: soft, mild diffuse tenderness Back: no CVA tenderness Ext: no C/C/E        Femoral pulses 2+=         DP/PT pulses intact and = Skin: warm and dry without rash Neuro: CNII-XII intact             Strength intact = bilaterally  Labs:   Lab Results  Component Value Date   WBC 9.6 03/26/2012   HGB 11.6* 03/26/2012   HCT 33.5* 03/26/2012   MCV 96.3 03/26/2012   PLT 193 03/26/2012    Lab 03/27/12 0425  NA 134*  K 4.6  CL 104  CO2 25  BUN 21  CREATININE 0.83  CALCIUM 8.6  PROT 6.0  BILITOT 0.3  ALKPHOS 156*  ALT 32  AST 36  GLUCOSE 95   No results found for this basename: CKTOTAL, CKMB, CKMBINDEX, TROPONINI    Lab Results  Component Value Date   CHOL 108 03/24/2012   CHOL 105 03/23/2012   No results found for this basename: HDL   No results found for this basename: LDLCALC   Lab Results  Component Value Date   TRIG 54 03/24/2012   TRIG 89 03/23/2012   No results found for this basename: CHOLHDL   No results found for this basename: LDLDIRECT      Radiology: No results found.  EKG: Atrial flutter with RVR, ventricular rate 122 beats per minute  ASSESSMENT AND PLAN:  1. Atrial flutter with RVR 2. Metastatic colon cancer status post gastrojejunostomy for treatment of bowel obstruction 3. Aortic valve disease status  post tissue aVR with normal valve function 4. Cardiomyopathy with left ventricular ejection fraction 40-45% by echocardiogram in 2012  For now I would recommend continuing the higher dose of IV metoprolol. I have reviewed the patient's office records and he was intolerant to Cardizem in the past because of leg swelling. This would be safe to use for a short time for rate control but it will not be a good long-term therapy for him. We can also add digoxin for rate control. Regarding anticoagulation, he has been deemed unsuitable candidate for long-term warfarin so I would recommend continuing with DVT prophylaxis dose Lovenox at this point. We'll follow throughout his hospital stay to assist with management of his atrial dysrhythmias.  I agree with transfer to a telemetry unit.  Tonny Bollman 03/27/2012, 9:00 AM

## 2012-03-28 ENCOUNTER — Other Ambulatory Visit: Payer: Medicare Other | Admitting: Lab

## 2012-03-28 ENCOUNTER — Inpatient Hospital Stay (HOSPITAL_COMMUNITY): Payer: Medicare Other

## 2012-03-28 LAB — BASIC METABOLIC PANEL
BUN: 22 mg/dL (ref 6–23)
CO2: 26 mEq/L (ref 19–32)
Calcium: 8.9 mg/dL (ref 8.4–10.5)
Creatinine, Ser: 0.8 mg/dL (ref 0.50–1.35)
GFR calc non Af Amer: 87 mL/min — ABNORMAL LOW (ref >90)
Glucose, Bld: 113 mg/dL — ABNORMAL HIGH (ref 70–99)
Sodium: 132 mEq/L — ABNORMAL LOW (ref 135–145)

## 2012-03-28 LAB — CBC
HCT: 35.6 % — ABNORMAL LOW (ref 39.0–52.0)
Hemoglobin: 12.1 g/dL — ABNORMAL LOW (ref 13.0–17.0)
MCH: 33.1 pg (ref 26.0–34.0)
MCHC: 34 g/dL (ref 30.0–36.0)
MCV: 97.3 fL (ref 78.0–100.0)
RBC: 3.66 MIL/uL — ABNORMAL LOW (ref 4.22–5.81)

## 2012-03-28 MED ORDER — DILTIAZEM HCL 100 MG IV SOLR
5.0000 mg/h | INTRAVENOUS | Status: DC
  Administered 2012-03-28 – 2012-04-01 (×4): 5 mg/h via INTRAVENOUS
  Filled 2012-03-28 (×6): qty 100

## 2012-03-28 MED ORDER — BISACODYL 10 MG RE SUPP
10.0000 mg | Freq: Two times a day (BID) | RECTAL | Status: DC
  Administered 2012-03-28 – 2012-04-08 (×15): 10 mg via RECTAL
  Filled 2012-03-28 (×17): qty 1

## 2012-03-28 MED ORDER — FAT EMULSION 20 % IV EMUL
240.0000 mL | INTRAVENOUS | Status: AC
  Administered 2012-03-28: 240 mL via INTRAVENOUS
  Filled 2012-03-28: qty 250

## 2012-03-28 MED ORDER — TRACE MINERALS CR-CU-MN-SE-ZN 10-1000-500-60 MCG/ML IV SOLN
INTRAVENOUS | Status: AC
  Administered 2012-03-28: 17:00:00 via INTRAVENOUS
  Filled 2012-03-28: qty 2400

## 2012-03-28 MED ORDER — ACETAMINOPHEN 10 MG/ML IV SOLN
1000.0000 mg | Freq: Four times a day (QID) | INTRAVENOUS | Status: AC
  Administered 2012-03-28 – 2012-03-29 (×3): 1000 mg via INTRAVENOUS
  Filled 2012-03-28 (×4): qty 100

## 2012-03-28 MED ORDER — HYDROMORPHONE 0.3 MG/ML IV SOLN
INTRAVENOUS | Status: AC
  Filled 2012-03-28: qty 25

## 2012-03-28 NOTE — Progress Notes (Signed)
Questionable 7 beats of vtach on the telemetry monitor. SR on the monitor in the 90s HR. Patient asleep and asymptomatic. No shortness of breath. NP Arguello on call paged and informed. No orders given. Will continue to monitor patient.

## 2012-03-28 NOTE — Progress Notes (Signed)
3 Days Post-Op  Subjective: He's fairly miserable.  Abdomen hurts, back hurts, he's not in any acute distress, just uncomfortable. Up to straight chair at bedside.  His wife is trying to help him get comfortable.  No flatus, 1225 per NG yesterday.  He has a new cannister so I don't know how much he's put out today.  Objective: Vital signs in last 24 hours: Temp:  [97.4 F (36.3 C)-98.9 F (37.2 C)] 97.8 F (36.6 C) (04/05 0357) Pulse Rate:  [72-123] 72  (04/05 1030) Resp:  [16-22] 22  (04/05 0800) BP: (125-163)/(64-91) 125/64 mmHg (04/05 1030) SpO2:  [93 %-98 %] 98 % (04/05 0800) Last BM Date: 03/22/12  Intake/Output from previous day: 04/04 0701 - 04/05 0700 In: 4358.5 [I.V.:1600; NG/GT:330; IV Piggyback:250; TPN:2178.5] Out: 2075 [Urine:850; Emesis/NG output:1225] Intake/Output this shift: Total I/O In: 30 [NG/GT:30] Out: 250 [Emesis/NG output:250]  General appearance: alert, appears stated age and mild distress Resp: clear to auscultation bilaterally GI: soft, still having pain with PCA; bowel sounds normal; no masses,  no organomegaly and Wounds look good  Lab Results:   [facility] 03/28/12 0516 03/26/12 0428  WBC 9.4 9.6  HGB 12.1* 11.6*  HCT 35.6* 33.5*  PLT 192 193    BMET  Basename 03/28/12 0516 03/27/12 0425  NA 132* 134*  K 4.5 4.6  CL 99 104  CO2 26 25  GLUCOSE 113* 95  BUN 22 21  CREATININE 0.80 0.83  CALCIUM 8.9 8.6   PT/INR No results found for this basename: LABPROT:2,INR:2 in the last 72 hours   Lab 03/27/12 0425 03/24/12 0421 03/23/12 0423  AST 36 64* 79*  ALT 32 77* 81*  ALKPHOS 156* 209* 205*  BILITOT 0.3 0.3 0.3  PROT 6.0 6.5 6.4  ALBUMIN 2.0* 2.5* 2.6*     Lipase  No results found for this basename: lipase     Studies/Results: Dg Chest 2 View  03/27/2012  *RADIOLOGY REPORT*  Clinical Data: Fever, congestion, cough, recent bowel surgery  CHEST - 2 VIEW  Comparison: March 17, 2012  Findings: The right sided Powerport tip is stable  at the cavoatrial junction.  The nasogastric tube tip overlies the gastric bubble. There is new infiltrate in the left mid lung and right lower lung. Cardiomegaly is unchanged.  The pulmonary vasculature is within normal limits.  IMPRESSION: Interval development of pneumonia in the left mid lung and right lower lung.  Original Report Authenticated By: Brandon Melnick, M.D.    Medications:    . digoxin  0.125 mg Intravenous Daily  . digoxin  0.25 mg Intravenous Q6H  . enoxaparin  40 mg Subcutaneous Q24H  . HYDROmorphone PCA 0.3 mg/mL   Intravenous Q4H  . HYDROmorphone PCA 0.3 mg/mL      . metoprolol  10 mg Intravenous Q4H  . moxifloxacin  400 mg Intravenous Q24H  . pantoprazole (PROTONIX) IV  40 mg Intravenous Q12H  . DISCONTD: HYDROmorphone PCA 0.3 mg/mL   Intravenous Q4H  . DISCONTD: HYDROmorphone PCA 0.3 mg/mL   Intravenous Q4H  . DISCONTD: metoprolol  10 mg Intravenous Q6H    Assessment/Plan Stage IV colon cancer, malignant obstruction of post bulbar duodenum, ventral hernia.  S/p Exploratory laparotomy, retrocolic gastrojejunostomy, repair ventral incisional hernia 03/25/12 Dr. Derrell Lolling.  Post op Aib/Flutter with RVR, not on anticoagulation due to Cancer.  Patient Active Problem List  Diagnoses  . COLON CANCER  . CAD, ARTERY BYPASS GRAFT  . Atrial fibrillation  . COPD  . DIVERTICULAR BLEEDING,  HX OF  . RENAL CALCULUS, HX OF  . BEE STING ALLERGY  . AORTIC VALVE REPLACEMENT, HX OF  . SHORTNESS OF BREATH  . ANEMIA-IRON DEFICIENCY  . Colitis  . Constipation  . PNA (pneumonia)  . UTI (lower urinary tract infection)  . Hypokalemia  . Hyponatremia  . Leukocytosis  . Encounter for fitting of portacath  . Bowel obstruction  . Transaminitis  . Duodenal stenosis    Plan: d/c foley, add IV tylenol.  He is on TNA, LFT's were up some, and improving. Will need to watch those with IV tylenol.    LOS: 6 days    Joanie Duprey 03/28/2012

## 2012-03-28 NOTE — Progress Notes (Signed)
General surgery attending note:  This afternoon, he is actually feeling much better. As his pain is under good control. No flatus or stool yet. NG drainage bilious but volume is less. He got a little dizzy when he tried to ambulate but no chest pain or shortness of breath. Atrial flutter is under better control and Cardizem drip but has occasional bursts of rapid ventricular rate.  Abdomen soft, much less tender than yesterday. We disclaim.  Plan: Continue NG tube today.           Try Dulcolax suppository today.  Hope to get NG tube out in a day or 2.   Angelia Mould. Derrell Lolling, M.D., [facility] General and Minimally invasive Surgery Breast and Colorectal Surgery Office:   [phone] Pager:   [phone]

## 2012-03-28 NOTE — Progress Notes (Signed)
Patient ID: Kyle Love, male   DOB: [birth year], 71 y.o.   MRN: [id number]  Subjective: No events overnight. Patient denies chest pain, shortness of breath, abdominal pain.   Objective:  Vital signs in last 24 hours:  Filed Vitals:   03/28/12 1030 03/28/12 1211 03/28/12 1452 03/28/12 1531  BP: 125/64 147/73 119/73   Pulse: 72  101   Temp:   97.4 F (36.3 C)   TempSrc:   Oral   Resp:  19 16 16   Height:      Weight:      SpO2:  97% 97% 99%   Intake/Output from previous day:  Intake/Output Summary (Last 24 hours) at 03/28/12 1707 Last data filed at 03/28/12 1455  Gross per 24 hour  Intake 5303.33 ml  Output   1425 ml  Net 3878.33 ml    Physical Exam: General: Alert, awake, oriented x3, in no acute distress. HEENT: No bruits, no goiter. Moist mucous membranes, no scleral icterus, no conjunctival pallor. NGT in place Heart: Regular rate and rhythm, S1/S2 +, no murmurs, rubs, gallops. Lungs: Clear to auscultation bilaterally with bibasilar crackles. No wheezing, no rhonchi, no rales.  Abdomen: Soft, mildly tender in epigastric area, nondistended, positive bowel sounds. Extremities: No clubbing or cyanosis, no pitting edema,  positive pedal pulses. Neuro: Grossly nonfocal.  Lab Results:  Lab 03/28/12 0516 03/26/12 0428 03/25/12 0355 03/24/12 0421 03/23/12 0423  WBC 9.4 9.6 7.5 7.1 6.6  HGB 12.1* 11.6* 10.9* 11.2* 10.9*  HCT 35.6* 33.5* 32.7* 33.3* 31.9*  PLT 192 193 172 181 188   Lab 03/28/12 0516 04-03-12 0425 03/26/12 0428 03/25/12 0355 03/24/12 0421  NA 132* 134* 132* 135 135  K 4.5 4.6 4.0 4.1 4.2  CL 99 104 103 103 103  CO2 26 25 24 25 26   GLUCOSE 113* 95 130* 111* 128*  BUN 22 21 18 17 15   CREATININE 0.80 0.83 0.77 0.84 0.92  CALCIUM 8.9 8.6 8.5 8.6 8.5  MG -- 1.8 -- -- 1.8  Cardiac markers:  Lab 04/03/2012 0913  CKMB 2.3  TROPONINI <0.30  MYOGLOBIN --   Studies/Results: Dg Chest 2 View 04/03/12   IMPRESSION:  Interval development of pneumonia in  the left mid lung and right lower lung.  Medications: Scheduled Meds:   . acetaminophen  1,000 mg Intravenous Q6H  . bisacodyl  10 mg Rectal BID  . digoxin  0.125 mg Intravenous Daily  . digoxin  0.25 mg Intravenous Q6H  . enoxaparin  40 mg Subcutaneous Q24H  . HYDROmorphone PCA 0.3 mg/mL   Intravenous Q4H  . HYDROmorphone PCA 0.3 mg/mL      . metoprolol  10 mg Intravenous Q4H  . moxifloxacin  400 mg Intravenous Q24H  . pantoprazole (PROTONIX) IV  40 mg Intravenous Q12H  . DISCONTD: metoprolol  10 mg Intravenous Q6H   Continuous Infusions:   . sodium chloride    . diltiazem (CARDIZEM) infusion 5 mg/hr (03/28/12 1027)  . TPN (CLINIMIX) +/- additives 90 mL/hr at 03/26/12 1712   And  . fat emulsion 240 mL (03/26/12 1711)  . TPN (CLINIMIX) +/- additives     And  . fat emulsion    . TPN (CLINIMIX) +/- additives 90 mL/hr at 03-Apr-2012 1802   PRN Meds:.diphenhydrAMINE, diphenhydrAMINE, naloxone, ondansetron (ZOFRAN) IV, ondansetron, sodium chloride, sodium chloride  Assessment/Plan:  Principal Problem:  *Bowel obstruction  - s/p gastrojejunostomy, post op day #3 - pt continues to use PCA pump and denies pain at the  moment  - continue to follow up on surgery recommendations  - NGT with 100 cc output overnight   Active Problems:  Atrial fibrillation  - HR in 80's this morning as pt was placed on cardizem drip - CXR consistent with PNA - cardiac enzymes x 3 negative   PNA - continue antibiotic for now - pt remains afebrile over 24 hour period  COLON CANCER  - no additional recommendations from oncology service   Duodenal stenosis  - s/p gastrojejunostomy, post op day #3  ANEMIA-IRON DEFICIENCY  - Hg/Hct remain stable and at pt's baseline   Hyponatremia  - stable  - BMP in AM   EDUCATION  - test results and diagnostic studies were discussed with patient and pt's family who was present at the bedside (wife)  - patient and family have verbalized the understanding  -  questions were answered at the bedside and contact information was provided for additional questions or concerns    LOS: 6 days   MAGICK-Chenille Toor 03/28/2012, 5:07 PM  [facility] Pager: [phone]

## 2012-03-28 NOTE — Clinical Documentation Improvement (Signed)
MALNUTRITION DOCUMENTATION CLARIFICATION  THIS DOCUMENT IS NOT A PERMANENT PART OF THE MEDICAL RECORD  TO RESPOND TO THE THIS QUERY, FOLLOW THE INSTRUCTIONS BELOW:  1. If needed, update documentation for the patient's encounter via the notes activity.  2. Access this query again and click edit on the In [employer].  3. After updating, or not, click F2 to complete all highlighted (required) fields concerning your review. Select "additional documentation in the medical record" OR "no additional documentation provided".  4. Click Sign note button.  5. The deficiency will fall out of your In Basket *Please let us know if you are not able to complete this workflow by phone or e-mail (listed below).  Please update your documentation within the medical record to reflect your response to this query.                                                                                        03/28/12   Dear Dr. Izola Price / Associates,  In a better effort to capture your patient's severity of illness, reflect appropriate length of stay and utilization of resources, a review of the patient medical record has revealed the following indicators.    Based on your clinical judgment, please clarify and document in a progress note and/or discharge summary the clinical condition associated with the following supporting information:  In responding to this query please exercise your independent judgment.  The fact that a query is asked, does not imply that any particular answer is desired or expected.  Pt admitted with bowel obstruction. Has  metastatic stage 4 colon CA. Please clarify/validate in progress notes nutritional assessment  on 03/24/12  indicating  pt meets criteria  for   Agree with diagnosis of severe protein calorie malnutrition secondary to stage 4 colon CA and progressive failure to thrive  Severe Malnutrition    Severe Protein Calorie Malnutrition      _______Other  Condition________________ _______Cannot clinically determine     Supporting Information: Risk Factors: Colon cancer,   Signs & Symptoms: per ED note : "unable to eat or drink  without significant pain. Positive for unexpected  weight change... " per H&P note: " weight decrease 20-30 lb in month...   started on TNA given his anorexia and weight loss" -Ht:  45ft 10 in      Wt:178  -BMI:25.63  -Weight  Loss: Pt reports 13lbs  over 2 weeks  Albumin level:2.5  -Total Protein: 6.5   Treatments: -Enteral Feeding: TPN fat emulsion  -Medications:NS@50ml /hr,  Protonix IV  -Nutrition Consult: meets criteria for severe PCM of acute illness AEB 6.8% weight loss with <50% energy intake for the past 2 weeks per pt and wife report    You may use possible, probable, or suspect with inpatient documentation. possible, probable, suspected diagnoses MUST be documented at the time of discharge  Reviewed: agree with diagnosis of severe protein calorie malnutrition given weight loss over 20 lbs and stage 4 colon cancer, progressive failure to thrive Thank You, Andy Gauss RN  Clinical Documentation Specialist:  Pager [phone] E-mail [email or web address]   Health Information Management [place]

## 2012-03-28 NOTE — Progress Notes (Signed)
    Subjective:  No chest pain or dyspnea. Pt with abdominal discomfort and looking forward to NG tube removal.  Objective:  Vital Signs in the last 24 hours: Temp:  [97.4 F (36.3 C)-98.9 F (37.2 C)] 97.8 F (36.6 C) (04/05 0357) Pulse Rate:  [113-123] 118  (04/05 0800) Resp:  [16-22] 22  (04/05 0800) BP: (127-163)/(77-91) 139/82 mmHg (04/05 0357) SpO2:  [93 %-98 %] 98 % (04/05 0800)  Intake/Output from previous day: 04/04 0701 - 04/05 0700 In: 4358.5 [I.V.:1600; NG/GT:330; IV Piggyback:250; TPN:2178.5] Out: 2075 [Urine:850; Emesis/NG output:1225]  Physical Exam: Pt is alert and oriented, elderly male in NAD HEENT: normal Neck: JVP - normal Lungs: CTA bilaterally CV: tachy, regular without murmur or gallop Abd: soft Ext: no C/C/E, distal pulses intact and equal Skin: warm/dry no rash   Lab Results:  Basename 03/28/12 0516 03/26/12 0428  WBC 9.4 9.6  HGB 12.1* 11.6*  PLT 192 193    Basename 03/28/12 0516 03/27/12 0425  NA 132* 134*  K 4.5 4.6  CL 99 104  CO2 26 25  GLUCOSE 113* 95  BUN 22 21  CREATININE 0.80 0.83    Basename 03/27/12 0913  TROPONINI <0.30   Tele: aflutter hr 120, episodic afib and short run of NSVT versus aberrancy (Ashman's)  Assessment/Plan:  1. Atrial flutter/fib with RVR  2. Metastatic colon cancer status post gastrojejunostomy for treatment of bowel obstruction  3. Aortic valve disease status post tissue aVR with normal prosthetic valve function  4. Cardiomyopathy with left ventricular ejection fraction 40-45% by echocardiogram in 2012   Heart rate remains elevated on IV metoprolol at max dose and IV digoxin. I discussed consideration of Amio with the patient but he and his wife tell me he is unable to take - had severe side effects in past. Cardizem is listed as an allergy but I reviewed old notes and it was stopped because of leg swelling. I don't think we have any other options in this patient who cannot take PO meds, so I'm going  to start him on IV cardizem today. As per prior note, no anticoagulation as per Dr [name] in setting metastatic CA. Continue DVT prophylaxis lovenox. Will follow, thx.  Tonny Bollman, M.D. 03/28/2012, 9:07 AM

## 2012-03-28 NOTE — Progress Notes (Signed)
PARENTERAL NUTRITION CONSULT NOTE - FOLLOW UP  Pharmacy Consult for TNA Indication: Duodenal Outlet Obstruction  Allergies  Allergen Reactions  . Diltiazem Hcl     REACTION: Severe lower extremity edema on generic cardizem  . Penicillins     Pt can tolerate cephalosporins/Pt tol Zosyn 11/01/11  . Plasma Human     Patient Measurements: Height: 5\' 10"  (177.8 cm) Weight: 188 lb 4.4 oz (85.4 kg) IBW/kg (Calculated) : 73  Adjusted Body Weight: 75 kg Recent weight loss: ~13 lb over 2 weeks  Vital Signs: Temp: 97.8 F (36.6 C) (04/05 0357) Temp src: Oral (04/05 0357) BP: 139/82 mmHg (04/05 0357) Pulse Rate: 113  (04/05 0357)  Intake/Output from previous day: 04/04 0701 - 04/05 0700 In: 4358.5 [I.V.:1600; NG/GT:330; IV Piggyback:250; TPN:2178.5] Out: 2075 [Urine:850; Emesis/NG output:1225]  Labs:  [facility] 03/28/12 0516 03/26/12 0428  WBC 9.4 9.6  HGB 12.1* 11.6*  HCT 35.6* 33.5*  PLT 192 193  APTT -- --  INR -- --    Basename 03/28/12 0516 03/27/12 0425 03/26/12 0428  NA 132* 134* 132*  K 4.5 4.6 4.0  CL 99 104 103  CO2 26 25 24   GLUCOSE 113* 95 130*  BUN 22 21 18   CREATININE 0.80 0.83 0.77  LABCREA -- -- --  CREAT24HRUR -- -- --  CALCIUM 8.9 8.6 8.5  MG -- 1.8 --  PHOS -- 3.0 --  PROT -- 6.0 --  ALBUMIN -- 2.0* --  AST -- 36 --  ALT -- 32 --  ALKPHOS -- 156* --  BILITOT -- 0.3 --  BILIDIR -- -- --  IBILI -- -- --  PREALBUMIN -- -- --  TRIG -- -- --  CHOLHDL -- -- --  CHOL -- -- --   Estimated Creatinine Clearance: 86.2 ml/min (by C-G formula based on Cr of 0.8).   Insulin Requirements in the past 24 hours:  No sliding scale ordered currently, CBGs < 150s  Current Nutrition:  Clinimix E5/20 at 90 ml/hr Lipid emulsion 20% at 13ml/hr  MWF only NPO  IVF: NS at 54ml/hr (increased per MD 4/2)  Assessment:  25 YOM with metastatic colon cancer, CT scan 3/8 with dilated duodenal bulb, possible malignant obstruction, liver masses.  EGD 3/31, with  duodenal outlet obstruction, attempted made for dilatation with balloon but lumen re-closed after balloon deflated. UGI 4/1, with complete obstruction in the post bulbar duodenum.  Patient is s/p exp lap and gastrojejunostomy 4/2.    TNA goal rate adjusted and advanced to goal 4/5 Na slightly low but unable to adjust in TNA bag.  Other electrolytes, cholesterol, TGs wnl.  Prealbumin down:  13 (3/30), 12 (3/31), 10.4 (4/1) LFTs elevated prior to starting TNA, now improving.  Monitor this closely. Lipid emulsion currently available for daily dosing, but due to high protein needs will not give daily to prevent excess kcal.  Nutritional Goals:   Re-estimated recs per RD 4/4: Kcal: 2250-2500, Protein: 120-145g, Fluid: 2.2-2.5L  Clinimix E5/20 at goal rate of 100 ml/hr + Lipids 20% at 70ml/hr (MWF only) = 120 g protein (1.4 g/kg/day), average of 2318 kcal/day  (2592 kcal MWF, 2112 kcal TTSS)  Plan:   TNA to goal rate with Clinimix E 5/20 at 100 ml/hr  Continue Lipid emulsion 20% at 32ml/hr only MWF  MVI and trace elements MWF only due to ongoing shortage  Defer to MD for IVF rate adjustment (currently at 48ml/hr today = total IVF rate of 147ml/hr)  Continue CBGs q8h, no SSI  ordered/required at this time  TNA lab panels on Mondays & Thursdays.   Lynann Beaver PharmD, BCPS Pager [phone] 03/28/2012 9:34 AM

## 2012-03-29 LAB — CBC
Hemoglobin: 10.9 g/dL — ABNORMAL LOW (ref 13.0–17.0)
MCH: 33 pg (ref 26.0–34.0)
MCV: 96.4 fL (ref 78.0–100.0)
RBC: 3.3 MIL/uL — ABNORMAL LOW (ref 4.22–5.81)

## 2012-03-29 LAB — TYPE AND SCREEN
ABO/RH(D): O POS
Antibody Screen: NEGATIVE
Unit division: 0
Unit division: 0

## 2012-03-29 LAB — BASIC METABOLIC PANEL
CO2: 23 mEq/L (ref 19–32)
Calcium: 8.2 mg/dL — ABNORMAL LOW (ref 8.4–10.5)
Creatinine, Ser: 0.71 mg/dL (ref 0.50–1.35)
Glucose, Bld: 114 mg/dL — ABNORMAL HIGH (ref 70–99)

## 2012-03-29 MED ORDER — ACETAMINOPHEN 10 MG/ML IV SOLN
1000.0000 mg | Freq: Four times a day (QID) | INTRAVENOUS | Status: AC
  Administered 2012-03-29 – 2012-03-30 (×4): 1000 mg via INTRAVENOUS
  Filled 2012-03-29 (×4): qty 100

## 2012-03-29 MED ORDER — POTASSIUM CHLORIDE 10 MEQ/50ML IV SOLN
10.0000 meq | INTRAVENOUS | Status: AC
  Administered 2012-03-29 (×4): 10 meq via INTRAVENOUS
  Filled 2012-03-29 (×8): qty 50

## 2012-03-29 MED ORDER — METOCLOPRAMIDE HCL 5 MG/ML IJ SOLN
10.0000 mg | Freq: Four times a day (QID) | INTRAMUSCULAR | Status: DC
  Administered 2012-03-29 – 2012-04-02 (×18): 10 mg via INTRAVENOUS
  Filled 2012-03-29 (×25): qty 2

## 2012-03-29 MED ORDER — CLINIMIX E/DEXTROSE (5/20) 5 % IV SOLN
INTRAVENOUS | Status: AC
  Administered 2012-03-29: 18:00:00 via INTRAVENOUS
  Filled 2012-03-29: qty 2400

## 2012-03-29 MED ORDER — HYDROMORPHONE 0.3 MG/ML IV SOLN
INTRAVENOUS | Status: AC
  Filled 2012-03-29: qty 25

## 2012-03-29 NOTE — Progress Notes (Signed)
SUBJECTIVE:  Heart rate much better controlled  OBJECTIVE:   Vitals:   Filed Vitals:   03/29/12 0000 03/29/12 0159 03/29/12 0400 03/29/12 0512  BP:  148/75  120/67  Pulse:  93  96  Temp:  97.6 F (36.4 C)  97.4 F (36.3 C)  TempSrc:  Oral  Oral  Resp: 20 21 18 19   Height:      Weight:      SpO2: 97% 97% 97% 96%   I&O's:   Intake/Output Summary (Last 24 hours) at 03/29/12 9604 Last data filed at 03/29/12 0700  Gross per 24 hour  Intake 2988.08 ml  Output   2050 ml  Net 938.08 ml   TELEMETRY: Reviewed telemetry pt in atrial fibrillation with CVR     PHYSICAL EXAM General: Well developed, well nourished, in no acute distress  Lungs:   Clear bilaterally to auscultation and percussion. Heart:  Irregularly irregular S1 S2 Pulses are 2+ & equal. Abdomen: Bowel sounds are positive, abdomen soft and non-tender without masses  Extremities:   No clubbing, cyanosis or edema.  DP +1 Neuro: Alert and oriented X 3. Psych:  Good affect, responds appropriately   LABS: Basic Metabolic Panel:  Basename 03/29/12 0445 03/28/12 0516 03/27/12 0425  NA 132* 132* --  K 3.7 4.5 --  CL 102 99 --  CO2 23 26 --  GLUCOSE 114* 113* --  BUN 23 22 --  CREATININE 0.71 0.80 --  CALCIUM 8.2* 8.9 --  MG -- -- 1.8  PHOS -- -- 3.0   Liver Function Tests:  Basename 03/27/12 0425  AST 36  ALT 32  ALKPHOS 156*  BILITOT 0.3  PROT 6.0  ALBUMIN 2.0*   No results found for this basename: LIPASE:2,AMYLASE:2 in the last 72 hours CBC:  Basename 03/29/12 0445 03/28/12 0516  WBC 8.3 9.4  NEUTROABS -- --  HGB 10.9* 12.1*  HCT 31.8* 35.6*  MCV 96.4 97.3  PLT 200 192   Cardiac Enzymes:  Basename 03/27/12 0913  CKTOTAL 151  CKMB 2.3  CKMBINDEX --  TROPONINI <0.30   Coag Panel:   Lab Results  Component Value Date   INR 1.05 05/31/2011   INR 1.09 05/21/2011   INR 2.08* 05/11/2011    RADIOLOGY: Dg Chest 2 View  03/27/2012  *RADIOLOGY REPORT*  Clinical Data: Fever, congestion, cough,  recent bowel surgery  CHEST - 2 VIEW  Comparison: March 17, 2012  Findings: The right sided Powerport tip is stable at the cavoatrial junction.  The nasogastric tube tip overlies the gastric bubble. There is new infiltrate in the left mid lung and right lower lung. Cardiomegaly is unchanged.  The pulmonary vasculature is within normal limits.  IMPRESSION: Interval development of pneumonia in the left mid lung and right lower lung.  Original Report Authenticated By: Brandon Melnick, M.D.   Dg Chest 2 View  03/17/2012  *RADIOLOGY REPORT*  Clinical Data: History of metastatic colon carcinoma.  CHEST - 2 VIEW  Comparison: PET CT 02/27/2012.  Findings: Tip of right internal jugular power Port-A-Cath is in the superior vena cava.  This cardiac silhouette is upper normal size. Ectasia and nonaneurysmal calcification of the thoracic aorta are seen.  Previous median sternotomy has been performed.  No pulmonary edema or pneumonia is evident.  On the previous PET CT a new hyper- metabolic nodular density was present within the medial aspect of the right lower lobe.  On the current chest examination on the PA image there is slight rounded  nodularity projecting over the descending pulmonary artery in this area but a discrete measurable mass is not visualized.  No pleural effusion is seen.  There is osteopenic appearance of bones.  There is degenerative spondylosis. There is stable slight decreased height of the T11 vertebral body.  IMPRESSION: Right internal jugular power Port-A-Cath is in place.  On the previous PET CT a new hypermetabolic nodular density was present within the medial aspect of the right lower lobe.  On the current chest examination on the PA image there is slight rounded nodularity projecting over the descending pulmonary artery in this area but a discrete measurable mass is not visualized.  Original Report Authenticated By: Crawford Givens, M.D.   Ct Abdomen W Contrast  03/21/2012  **ADDENDUM** CREATED:  03/21/2012 16:28:29  Original report dictated by Dr. Eppie Gibson.  Addended by Dr. Carlota Raspberry.  The study was reviewed with Dr. Truett Perna after the initial interpretation.  The patient has upper abdominal obstructive symptoms with nausea and vomiting.  The patient has been unresponsive to fluid clinically.  There is dilation of the duodenal bulb.  The pylorus can be identified proximal to the duodenal bulb.  At the junction of the first and second part of the duodenum, there is narrowing of the duodenum with endoluminal nodular density and circumferential narrowing.  The appearance is compatible with stricture which may be malignant.  In this patient with known metastatic disease, this could represent seeding and invasion or potentially metastatic disease to bowel. Other neoplastic etiologies are considered less likely based on the presence of metastatic disease.  Significantly, the junction of the first and second part of the duodenum is narrowed although contrast does pass distally into the small bowel.  This may account for upper abdominal obstructive symptoms.  Gastroenterology consultation may be helpful to further evaluate, possibly with endoscopy for either dilation or passage of feeding tube distally.  **END ADDENDUM** SIGNED BY: Andreas Newport, M.D.    03/21/2012  *RADIOLOGY REPORT*  Clinical Data:  Follow-up metastatic colon carcinoma.  Recently completed chemotherapy.  CT ABDOMEN WITH CONTRAST  Technique:  Multidetector CT imaging of the abdomen was performed using the standard protocol following bolus administration of intravenous contrast.  Contrast: 80mL OMNIPAQUE IOHEXOL 300 MG/ML IJ SOLN  Comparison:  02/15/2012  Findings:  A hypovascular metastasis in the anterior segment of the right hepatic lobe has increased in size since previous study, now measuring 2.0 x 3.5 cm compared with with 1.7 x 2.0 cm on previous study.  At least two new hypovascular metastases are seen in the dome of the right hepatic lobe which  measure 1.9 cm and 2.6 cm in diameter.  The wire to other tiny less than 1 cm low attenuation lesions are seen which likely represent additional metastases.  Mild increase in lymphadenopathy is seen in the porta hepatis and celiac access as well as the portacaval space.  The largest lymph node in the portacaval space measures 1.7 cm in short axis compared with 1.0 cm on prior study.  A new 1.8 cm mass is also seen involving the left adrenal gland.  The spleen and pancreas remain normal in appearance.  Bilateral renal cysts are stable there is no evidence of renal mass or hydronephrosis.  Mild lymphadenopathy is seen left para-aortic region measuring up to 1.6 cm in short axis, which is increased since previous study.  There is no evidence of inflammatory process or abscess within the abdomen.  No evidence of dilated abdominal bowel loops.  IMPRESSION:  1.  Interval progression of liver metastases since prior study. 2.  New mild upper abdominal and retroperitoneal lymphadenopathy, consistent with metastatic disease. 3.  New small left adrenal mass, consistent with metastatic disease. Original Report Authenticated By: Andreas Newport, M.D.   Dg Chest Port 1 View  03/28/2012  *RADIOLOGY REPORT*  Clinical Data: Shortness of breath, congestion, history of carcinoma of the colon  PORTABLE CHEST - 1 VIEW  Comparison: Chest x-ray of 03/27/2012 and 01/05/2011  Findings: There is little change in the opacity in the left mid lung base and at the right lung base suspicious for pneumonia. Interval development of pulmonary metastases cannot be excluded as well.  There may be a right effusion present.  Cardiomegaly is stable.  Right-sided power port Port-A-Cath remains with the tip in the lower SVC.  An NG tube is present.  IMPRESSION: Persistent opacity in the left mid lung base and at the right lung base suspicious for pneumonia and possible right effusion.  Cannot exclude development of metastatic disease in this patient with  carcinoma of the colon.  Original Report Authenticated By: Juline Patch, M.D.   Dg Abd 2 Views  03/20/2012  *RADIOLOGY REPORT*  Clinical Data: Left side abdominal pain with eating.  No bowel movement for 4 days.  ABDOMEN - 2 VIEW  Comparison: Two views abdomen 03/17/2012 and CT abdomen and pelvis 02/15/2012.  Findings: No free intraperitoneal air is identified.  The bowel gas pattern is unremarkable.  Mild gaseous distention of the colon seen on prior study has decreased.  No evidence of small bowel obstruction.  Cholecystectomy clips noted.  IMPRESSION: Negative exam.  Original Report Authenticated By: Bernadene Bell. Maricela Curet, M.D.   Dg Abd 2 Views  03/17/2012  *RADIOLOGY REPORT*  Clinical Data: History of colon carcinoma.  History of vomiting.  ABDOMEN - 2 VIEW  Comparison: CT 02/15/2012.  Findings: Previous median sternotomy has been performed. Cholecystectomy clips are seen.  There is no evidence of pneumoperitoneum.  Pelvic phleboliths are seen.  There is slight scoliosis convexity to the left.  There is degenerative spondylosis.  Bowel gas pattern is nonspecific.  There is mild gaseous distention of portions of the colon.  Small bowel pattern is within normal limits.  No opaque calculi are seen.  IMPRESSION: No evidence of pneumoperitoneum.  No evidence of bowel obstruction.  Original Report Authenticated By: Crawford Givens, M.D.   Dg Abd Portable 1v  03/24/2012  *RADIOLOGY REPORT*  Clinical Data: Evaluate for barium.  Upper GI today  PORTABLE ABDOMEN - 1 VIEW  Comparison: 03/24/2012  Findings: Barium remains in the antrum of the stomach.  The upper GI suggests an obstruction of the duodenum.  There is now  barium in the small bowel which is nondilated.  A small amount of barium has entered the colon and rectum.  IMPRESSION: There is retained barium in the stomach.  Barium has passed into nondilated small bowel and colon suggesting partial obstruction of the duodenum.  Original Report Authenticated By: Camelia Phenes, M.D.   Varney Biles Kayleen Memos W/kub  03/24/2012  *RADIOLOGY REPORT*  Clinical Data:  History of recurrent carcinoma of the colon with liver metastases, evaluate for possible duodenal obstruction  UPPER GI SERIES WITH KUB  Technique:  Routine upper GI series was performed with thin barium  Fluoroscopy Time: 3.07 minutes  Comparison:  CT abdomen and pelvis of 03/21/2012  Findings: A preliminary film of the abdomen shows a large amount of gas within the colon and gaseous distention  of the stomach as well. No definite distended small bowel is seen.  In view of the distended stomach, thin barium was utilized rather than water soluble contrast.  On the preliminary film there appear to be flowing osteophytes present and probable sacroiliitis most consistent with ankylosing spondylitis.  Barium was given orally and images of the esophagus were obtained. There is a diminished primary peristaltic wave present.  A small hiatal hernia is noted.  With the patient on his back, the barium collected within the fundus of the stomach.  He was then placed in the right lateral decubitus position and monitored for up to 50 minutes.  Barium did enter the duodenal bulb, but did not enter the post bulbar duodenum where a lesion has been noted on recent CT.  IMPRESSION: The patient is obstructed immediately post bulbar.  No barium passes beyond the obstruction over 50 minutes of intermittent fluoroscopic evaluation.  Original Report Authenticated By: Juline Patch, M.D.      ASSESSMENT:  1. Atrial flutter/fib with RVR - heart rate much improved on IV Cardizem gtt 2. Metastatic colon cancer status post gastrojejunostomy for treatment of bowel obstruction  3. Aortic valve disease status post tissue aVR with normal prosthetic valve function  4. Cardiomyopathy with left ventricular ejection fraction 40-45% by echocardiogram in 2012    PLAN:   1.  Continue IV digoxin/Cardizem and metoprolol for rate control of afib 2.  No  anticoagulation as per prior notes.  Quintella Reichert, MD  03/29/2012  8:07 AM

## 2012-03-29 NOTE — Progress Notes (Signed)
PARENTERAL NUTRITION CONSULT NOTE - FOLLOW UP  Pharmacy Consult for TNA Indication: Duodenal Outlet Obstruction  Allergies  Allergen Reactions  . Diltiazem Hcl     REACTION: Severe lower extremity edema on generic cardizem  . Penicillins     Pt can tolerate cephalosporins/Pt tol Zosyn 11/01/11  . Plasma Human     Patient Measurements: Height: 5\' 10"  (177.8 cm) Weight: 188 lb 4.4 oz (85.4 kg) IBW/kg (Calculated) : 73  Adjusted Body Weight: 75 kg Recent weight loss: ~13 lb over 2 weeks  Vital Signs: Temp: 97.4 F (36.3 C) (04/06 0512) Temp src: Oral (04/06 0512) BP: 120/67 mmHg (04/06 0512) Pulse Rate: 96  (04/06 0512)  Intake/Output from previous day: 04/05 0701 - 04/06 0700 In: 2988.1 [I.V.:691.8; NG/GT:540; IV Piggyback:460; TPN:1296.3] Out: 2050 [Urine:1050; Emesis/NG output:1000]  Labs:  [facility] 03/29/12 0445 03/28/12 0516  WBC 8.3 9.4  HGB 10.9* 12.1*  HCT 31.8* 35.6*  PLT 200 192  APTT -- --  INR -- --    Basename 03/29/12 0445 03/28/12 0516 03/27/12 0425  NA 132* 132* 134*  K 3.7 4.5 4.6  CL 102 99 104  CO2 23 26 25   GLUCOSE 114* 113* 95  BUN 23 22 21   CREATININE 0.71 0.80 0.83  LABCREA -- -- --  CREAT24HRUR -- -- --  CALCIUM 8.2* 8.9 8.6  MG -- -- 1.8  PHOS -- -- 3.0  PROT -- -- 6.0  ALBUMIN -- -- 2.0*  AST -- -- 36  ALT -- -- 32  ALKPHOS -- -- 156*  BILITOT -- -- 0.3  BILIDIR -- -- --  IBILI -- -- --  PREALBUMIN -- -- --  TRIG -- -- --  CHOLHDL -- -- --  CHOL -- -- --   Estimated Creatinine Clearance: 86.2 ml/min (by C-G formula based on Cr of 0.71).   Insulin Requirements in the past 24 hours:  No sliding scale ordered currently, CBGs < 150s  Current Nutrition:  Clinimix E5/20 at 100 ml/hr Lipid emulsion 20% at 5ml/hr  MWF only NPO  IVF: NS at [facility]  Assessment:  24 YOM with metastatic colon cancer, CT scan 3/8 with dilated duodenal bulb, possible malignant obstruction, liver masses.  EGD 3/31, with duodenal outlet  obstruction, attempted made for dilatation with balloon but lumen re-closed after balloon deflated. UGI 4/1, with complete obstruction in the post bulbar duodenum.  Patient is s/p exp lap and gastrojejunostomy 4/2.    TNA goal rate adjusted and advanced to goal 4/5 Na slightly low but unable to adjust in TNA bag.  Other electrolytes, cholesterol, TGs wnl.  Prealbumin down:  13 (3/30), 12 (3/31), 10.4 (4/1) LFTs elevated prior to starting TNA, now improving.  Alk phos high but trending down.  Monitor this closely. Lipid emulsion currently available for daily dosing, but due to high protein needs will not give daily to prevent excess kcal.  Nutritional Goals:   Re-estimated recs per RD 4/4: Kcal: 2250-2500, Protein: 120-145g, Fluid: 2.2-2.5L  Clinimix E5/20 at goal rate of 100 ml/hr + Lipids 20% at 48ml/hr (MWF only) = 120 g protein (1.4 g/kg/day), average of 2318 kcal/day  (2592 kcal MWF, 2112 kcal TTSS)  Plan:   TNA goal rate Clinimix E 5/20 at 100 ml/hr  Continue Lipid emulsion 20% at 55ml/hr only MWF  MVI and trace elements MWF only due to ongoing shortage  Continue CBGs q8h, no SSI ordered/required at this time  TNA lab panels on Mondays & Thursdays.  Clance Boll, PharmD, BCPS  Pager: [phone] 03/29/2012 10:42 AM

## 2012-03-29 NOTE — Progress Notes (Signed)
Patient ID: Kyle Love, male   DOB: 01/01/1941, 71 y.o.   MRN: [id number]  Subjective: No events overnight. Patient denies chest pain, shortness of breath, abdominal pain. He reports ambulating and feeling slightly better.  Objective:  Vital signs in last 24 hours:  Filed Vitals:   03/29/12 0159 03/29/12 0400 03/29/12 0512 03/29/12 1045  BP: 148/75  120/67 149/75  Pulse: 93  96 92  Temp: 97.6 F (36.4 C)  97.4 F (36.3 C) 97.5 F (36.4 C)  TempSrc: Oral  Oral Oral  Resp: 21 18 19 20   Height:      Weight:      SpO2: 97% 97% 96% 98%    Intake/Output from previous day:   Intake/Output Summary (Last 24 hours) at 03/29/12 1049 Last data filed at 03/29/12 1038  Gross per 24 hour  Intake 2748.08 ml  Output   2040 ml  Net 708.08 ml    Physical Exam: General: Alert, awake, oriented x3, in no acute distress. HEENT: No bruits, no goiter. Moist mucous membranes, no scleral icterus, no conjunctival pallor. Heart: Regular rate and rhythm, S1/S2 +, no murmurs, rubs, gallops. Lungs: Clear to auscultation bilaterally. No wheezing, no rhonchi, no rales.  Abdomen: Soft, tender slightly in epigastric area, mildly distended, positive bowel sounds. Biliary drain in place.  Extremities: No clubbing or cyanosis, no pitting edema,  positive pedal pulses. Neuro: Grossly nonfocal.  Lab Results:  Lab 03/29/12 0445 03/28/12 0516 03/26/12 0428 03/25/12 0355 03/24/12 0421  WBC 8.3 9.4 9.6 7.5 7.1  HGB 10.9* 12.1* 11.6* 10.9* 11.2*  HCT 31.8* 35.6* 33.5* 32.7* 33.3*  PLT 200 192 193 172 181    Lab 03/29/12 0445 03/28/12 0516 03/27/12 0425 03/26/12 0428  NA 132* 132* 134* 132*  K 3.7 4.5 4.6 4.0  CL 102 99 104 103  CO2 23 26 25 24   GLUCOSE 114* 113* 95 130*  BUN 23 22 21 18   CREATININE 0.71 0.80 0.83 0.77  CALCIUM 8.2* 8.9 8.6 8.5   Lab 03/27/12 0913  CKMB 2.3  TROPONINI <0.30  MYOGLOBIN --   Studies/Results: Dg Chest Port 1 View 03/28/2012  IMPRESSION:  Persistent opacity  in the left mid lung base and at the right lung base suspicious for pneumonia and possible right effusion.  Cannot exclude development of metastatic disease in this patient with carcinoma of the colon.    Medications: Scheduled Meds:   . acetaminophen  1,000 mg Intravenous Q6H  . acetaminophen  1,000 mg Intravenous Q6H  . bisacodyl  10 mg Rectal BID  . digoxin  0.125 mg Intravenous Daily  . enoxaparin  40 mg Subcutaneous Q24H  . HYDROmorphone PCA 0.3 mg/mL   Intravenous Q4H  . metoCLOPramide (REGLAN) injection  10 mg Intravenous Q6H  . metoprolol  10 mg Intravenous Q4H  . moxifloxacin  400 mg Intravenous Q24H  . pantoprazole (PROTONIX) IV  40 mg Intravenous Q12H  . potassium chloride  10 mEq Intravenous Q1 Hr x 4   Continuous Infusions:   . sodium chloride    . diltiazem (CARDIZEM) infusion 5 mg/hr (03/28/12 1027)  . TPN (CLINIMIX) +/- additives 100 mL/hr at 03/28/12 1722   And  . fat emulsion 240 mL (03/28/12 1722)  . TPN (CLINIMIX) +/- additives 90 mL/hr at 03/27/12 1802  . TPN (CLINIMIX) +/- additives     PRN Meds:.diphenhydrAMINE, diphenhydrAMINE, naloxone, ondansetron (ZOFRAN) IV, ondansetron, sodium chloride, sodium chloride  Assessment/Plan:  Principal Problem:  *Bowel obstruction  - s/p gastrojejunostomy, post  op day #4 - pt continues to use PCA pump and denies pain at the moment  - continue to follow up on surgery recommendations  - NGT with 100 cc output overnight   Active Problems:  Atrial fibrillation  - HR in 80's this morning as pt was placed on cardizem drip  - CXR consistent with PNA  - cardiac enzymes x 3 negative   PNA  - continue antibiotic for now day #3 - pt remains afebrile over 24 hour period   COLON CANCER  - no additional recommendations from oncology service   Duodenal stenosis  - s/p gastrojejunostomy, post op day #4  ANEMIA-IRON DEFICIENCY  - Hg/Hct remain stable and at pt's baseline   Hyponatremia  - stable  - BMP in AM    EDUCATION  - test results and diagnostic studies were discussed with patient and pt's family who was present at the bedside (wife)  - patient and family have verbalized the understanding  - questions were answered at the bedside and contact information was provided for additional questions or concerns    LOS: 7 days   [name] 03/29/2012, 10:49 AM  [facility] Pager: [phone]

## 2012-03-29 NOTE — Progress Notes (Signed)
4 Days Post-Op  Subjective: Feeling better. Less pain. Says he had a small loose BM. Ambulating in halls.  NG drainage 1000 cc last 24 hours. Bilious.   Objective: Vital signs in last 24 hours: Temp:  [97.4 F (36.3 C)-98.4 F (36.9 C)] 97.4 F (36.3 C) (04/06 0512) Pulse Rate:  [72-101] 96  (04/06 0512) Resp:  [16-21] 19  (04/06 0512) BP: (119-148)/(64-83) 120/67 mmHg (04/06 0512) SpO2:  [92 %-99 %] 96 % (04/06 0512) Last BM Date: 03/22/12  Intake/Output from previous day: 04/05 0701 - 04/06 0700 In: 2988.1 [I.V.:691.8; NG/GT:540; IV Piggyback:460; TPN:1296.3] Out: 2050 [Urine:1050; Emesis/NG output:1000] Intake/Output this shift:    General appearance: alert. Appears comfortable. No distress. GI: abdomen soft, somewhat distended. Minimally tender. Rare bowel sounds. Wound clean.  Lab Results:  Results for orders placed during the hospital encounter of 03/22/12 (from the past 24 hour(s))  GLUCOSE, CAPILLARY     Status: Abnormal   Collection Time   03/28/12  4:00 PM      Component Value Range   Glucose-Capillary 106 (*) 70 - 99 (mg/dL)  GLUCOSE, CAPILLARY     Status: Abnormal   Collection Time   03/28/12  9:13 PM      Component Value Range   Glucose-Capillary 120 (*) 70 - 99 (mg/dL)  PRO B NATRIURETIC PEPTIDE     Status: Abnormal   Collection Time   03/29/12  4:20 AM      Component Value Range   Pro B Natriuretic peptide (BNP) 404.8 (*) 0 - 125 (pg/mL)  CBC     Status: Abnormal   Collection Time   03/29/12  4:45 AM      Component Value Range   WBC 8.3  4.0 - 10.5 (K/uL)   RBC 3.30 (*) 4.22 - 5.81 (MIL/uL)   Hemoglobin 10.9 (*) 13.0 - 17.0 (g/dL)   HCT 16.1 (*) 09.6 - 52.0 (%)   MCV 96.4  78.0 - 100.0 (fL)   MCH 33.0  26.0 - 34.0 (pg)   MCHC 34.3  30.0 - 36.0 (g/dL)   RDW 04.5  40.9 - 81.1 (%)   Platelets 200  150 - 400 (K/uL)  BASIC METABOLIC PANEL     Status: Abnormal   Collection Time   03/29/12  4:45 AM      Component Value Range   Sodium 132 (*) 135 - 145  (mEq/L)   Potassium 3.7  3.5 - 5.1 (mEq/L)   Chloride 102  96 - 112 (mEq/L)   CO2 23  19 - 32 (mEq/L)   Glucose, Bld 114 (*) 70 - 99 (mg/dL)   BUN 23  6 - 23 (mg/dL)   Creatinine, Ser 9.14  0.50 - 1.35 (mg/dL)   Calcium 8.2 (*) 8.4 - 10.5 (mg/dL)   GFR calc non Af Amer >90  >90 (mL/min)   GFR calc Af Amer >90  >90 (mL/min)  GLUCOSE, CAPILLARY     Status: Abnormal   Collection Time   03/29/12  7:56 AM      Component Value Range   Glucose-Capillary 126 (*) 70 - 99 (mg/dL)     Studies/Results: @[id number] @     . acetaminophen  1,000 mg Intravenous Q6H  . bisacodyl  10 mg Rectal BID  . digoxin  0.125 mg Intravenous Daily  . enoxaparin  40 mg Subcutaneous Q24H  . HYDROmorphone PCA 0.3 mg/mL   Intravenous Q4H  . HYDROmorphone PCA 0.3 mg/mL      . metoprolol  10 mg  Intravenous Q4H  . moxifloxacin  400 mg Intravenous Q24H  . pantoprazole (PROTONIX) IV  40 mg Intravenous Q12H     Assessment/Plan: s/p Procedure(s): GASTROJEJUNOSTOMY HERNIA REPAIR VENTRAL ADULT  POD #4. Stable Postop ileus, expected Gastroparesis, expected.  Leave NG today.  We'll start him on IV Reglan to see if that will help resolve his gastroparesis.   Baseline relative hypokalemia, we'll give IV potassium runs. Check  b-met tomorrow.  Continue TNA.    LOS: 7 days    Nakai Pollio M. Derrell Lolling, M.D., [facility] General and Minimally invasive Surgery Breast and Colorectal Surgery Office:   [phone] Pager:   [phone]  03/29/2012  . .prob

## 2012-03-30 LAB — BASIC METABOLIC PANEL
CO2: 26 mEq/L (ref 19–32)
Chloride: 101 mEq/L (ref 96–112)
Glucose, Bld: 107 mg/dL — ABNORMAL HIGH (ref 70–99)
Potassium: 4.3 mEq/L (ref 3.5–5.1)
Sodium: 131 mEq/L — ABNORMAL LOW (ref 135–145)

## 2012-03-30 LAB — GLUCOSE, CAPILLARY
Glucose-Capillary: 121 mg/dL — ABNORMAL HIGH (ref 70–99)
Glucose-Capillary: 117 mg/dL — ABNORMAL HIGH (ref 70–99)
Glucose-Capillary: 116 mg/dL — ABNORMAL HIGH (ref 70–99)
Glucose-Capillary: 126 mg/dL — ABNORMAL HIGH (ref 70–99)

## 2012-03-30 MED ORDER — MAGIC MOUTHWASH
10.0000 mL | Freq: Three times a day (TID) | ORAL | Status: DC
  Administered 2012-03-30 – 2012-03-31 (×3): 10 mL via ORAL
  Administered 2012-03-31: 15 mL via ORAL
  Administered 2012-03-31 – 2012-04-08 (×22): 10 mL via ORAL
  Filled 2012-03-30 (×30): qty 10

## 2012-03-30 MED ORDER — HYDROMORPHONE 0.3 MG/ML IV SOLN
INTRAVENOUS | Status: AC
  Filled 2012-03-30: qty 25

## 2012-03-30 MED ORDER — CLINIMIX E/DEXTROSE (5/20) 5 % IV SOLN
INTRAVENOUS | Status: AC
  Administered 2012-03-30: 18:00:00 via INTRAVENOUS
  Filled 2012-03-30: qty 2400

## 2012-03-30 NOTE — Progress Notes (Signed)
5 Days Post-Op  Subjective: States he had a good bowel movement and passing flatus. Denies nausea. NG tube drainage record is incomplete, a drain 500 cc over one 12 hour shift. Not much in the canister this morning.  On IV Reglan.  Objective: Vital signs in last 24 hours: Temp:  [97.3 F (36.3 C)-97.9 F (36.6 C)] 97.9 F (36.6 C) (04/07 0600) Pulse Rate:  [86-101] 86  (04/07 0600) Resp:  [15-24] 19  (04/07 0600) BP: (120-153)/(66-75) 130/70 mmHg (04/07 0600) SpO2:  [95 %-99 %] 98 % (04/07 0600) Last BM Date: 03/29/12  Intake/Output from previous day: 04/06 0701 - 04/07 0700 In: 3599.3 [NG/GT:570; IV Piggyback:666; TPN:2363.3] Out: 1425 [Urine:900; Emesis/NG output:525] Intake/Output this shift:    General appearance: alert. Mental status normal. No distress. GI: abdomen is soft with active bowel sounds. Wound is clean. Slightly distended.  Lab Results:  Results for orders placed during the hospital encounter of 03/22/12 (from the past 24 hour(s))  GLUCOSE, CAPILLARY     Status: Abnormal   Collection Time   03/29/12  7:56 AM      Component Value Range   Glucose-Capillary 126 (*) 70 - 99 (mg/dL)  GLUCOSE, CAPILLARY     Status: Abnormal   Collection Time   03/29/12 12:01 PM      Component Value Range   Glucose-Capillary 119 (*) 70 - 99 (mg/dL)  BASIC METABOLIC PANEL     Status: Abnormal   Collection Time   03/30/12  5:18 AM      Component Value Range   Sodium 131 (*) 135 - 145 (mEq/L)   Potassium 4.3  3.5 - 5.1 (mEq/L)   Chloride 101  96 - 112 (mEq/L)   CO2 26  19 - 32 (mEq/L)   Glucose, Bld 107 (*) 70 - 99 (mg/dL)   BUN 26 (*) 6 - 23 (mg/dL)   Creatinine, Ser 1.61  0.50 - 1.35 (mg/dL)   Calcium 8.5  8.4 - 09.6 (mg/dL)   GFR calc non Af Amer 82 (*) >90 (mL/min)   GFR calc Af Amer >90  >90 (mL/min)     Studies/Results: @RISRSLT24 @     . acetaminophen  1,000 mg Intravenous Q6H  . acetaminophen  1,000 mg Intravenous Q6H  . bisacodyl  10 mg Rectal BID  . digoxin   0.125 mg Intravenous Daily  . enoxaparin  40 mg Subcutaneous Q24H  . HYDROmorphone PCA 0.3 mg/mL   Intravenous Q4H  . HYDROmorphone PCA 0.3 mg/mL      . metoCLOPramide (REGLAN) injection  10 mg Intravenous Q6H  . metoprolol  10 mg Intravenous Q4H  . moxifloxacin  400 mg Intravenous Q24H  . pantoprazole (PROTONIX) IV  40 mg Intravenous Q12H  . potassium chloride  10 mEq Intravenous Q1 Hr x 4     Assessment/Plan: s/p Procedure(s): GASTROJEJUNOSTOMY HERNIA REPAIR VENTRAL ADULT  By mouth daily #5. Stable. Ileus is resolving. Gastroparesis, expected. This may be improving. We will clamp NG tube today and allow clear liquids. I would hold off on removing the NG tube until we are more sure that his gastric atony has resolved.  Hypokalemia has resolved.  Continue TNA    LOS: 8 days    Krislyn Donnan M. Derrell Lolling, M.D., [facility] General and Minimally invasive Surgery Breast and Colorectal Surgery Office:   [phone] Pager:   [phone]  03/30/2012  . .prob

## 2012-03-30 NOTE — Progress Notes (Signed)
Flushed and capped NG tube.  Patient drank about 250cc.  So far, tolerating well.  Patient is resting.

## 2012-03-30 NOTE — Progress Notes (Signed)
SUBJECTIVE:  Remains in atrial fibrillation with CVR  OBJECTIVE:   Vitals:   Filed Vitals:   03/30/12 0000 03/30/12 0155 03/30/12 0400 03/30/12 0600  BP:  120/66  130/70  Pulse:    86  Temp:    97.9 F (36.6 C)  TempSrc:    Oral  Resp: 17  16 19   Height:      Weight:      SpO2: 97%  95% 98%   I&O's:   Intake/Output Summary (Last 24 hours) at 03/30/12 0943 Last data filed at 03/30/12 1610  Gross per 24 hour  Intake 3599.33 ml  Output   1425 ml  Net 2174.33 ml   TELEMETRY: Reviewed telemetry pt in atrial fibrillation     PHYSICAL EXAM General: Well developed, well nourished, in no acute distress Head: Eyes PERRLA, No xanthomas.   Normal cephalic and atramatic  Lungs:   Clear bilaterally to auscultation and percussion. Heart:   Irregularly irregular S1 S2 Pulses are 2+ & equal.            No carotid bruit. No JVD.  No abdominal bruits. No femoral bruits. Abdomen: Bowel sounds are positive, abdomen soft and non-tender without masses Extremities:   No clubbing, cyanosis or edema.  DP +1 Neuro: Alert and oriented X 3.   LABS: Basic Metabolic Panel:  Basename 03/30/12 0518 03/29/12 0445  NA 131* 132*  K 4.3 3.7  CL 101 102  CO2 26 23  GLUCOSE 107* 114*  BUN 26* 23  CREATININE 0.92 0.71  CALCIUM 8.5 8.2*  MG -- --  PHOS -- --   CBC:  Basename 03/29/12 0445 03/28/12 0516  WBC 8.3 9.4  NEUTROABS -- --  HGB 10.9* 12.1*  HCT 31.8* 35.6*  MCV 96.4 97.3  PLT 200 192   Coag Panel:   Lab Results  Component Value Date   INR 1.05 05/31/2011   INR 1.09 05/21/2011   INR 2.08* 05/11/2011    RADIOLOGY: Dg Chest 2 View  03/27/2012  *RADIOLOGY REPORT*  Clinical Data: Fever, congestion, cough, recent bowel surgery  CHEST - 2 VIEW  Comparison: March 17, 2012  Findings: The right sided Powerport tip is stable at the cavoatrial junction.  The nasogastric tube tip overlies the gastric bubble. There is new infiltrate in the left mid lung and right lower lung. Cardiomegaly is  unchanged.  The pulmonary vasculature is within normal limits.  IMPRESSION: Interval development of pneumonia in the left mid lung and right lower lung.  Original Report Authenticated By: Brandon Melnick, M.D.   Dg Chest 2 View  03/17/2012  *RADIOLOGY REPORT*  Clinical Data: History of metastatic colon carcinoma.  CHEST - 2 VIEW  Comparison: PET CT 02/27/2012.  Findings: Tip of right internal jugular power Port-A-Cath is in the superior vena cava.  This cardiac silhouette is upper normal size. Ectasia and nonaneurysmal calcification of the thoracic aorta are seen.  Previous median sternotomy has been performed.  No pulmonary edema or pneumonia is evident.  On the previous PET CT a new hyper- metabolic nodular density was present within the medial aspect of the right lower lobe.  On the current chest examination on the PA image there is slight rounded nodularity projecting over the descending pulmonary artery in this area but a discrete measurable mass is not visualized.  No pleural effusion is seen.  There is osteopenic appearance of bones.  There is degenerative spondylosis. There is stable slight decreased height of the T11 vertebral body.  IMPRESSION: Right internal jugular power Port-A-Cath is in place.  On the previous PET CT a new hypermetabolic nodular density was present within the medial aspect of the right lower lobe.  On the current chest examination on the PA image there is slight rounded nodularity projecting over the descending pulmonary artery in this area but a discrete measurable mass is not visualized.  Original Report Authenticated By: Crawford Givens, M.D.   Ct Abdomen W Contrast  03/21/2012  **ADDENDUM** CREATED: 03/21/2012 16:28:29  Original report dictated by Dr. Eppie Gibson.  Addended by Dr. Carlota Raspberry.  The study was reviewed with Dr. Truett Perna after the initial interpretation.  The patient has upper abdominal obstructive symptoms with nausea and vomiting.  The patient has been unresponsive to fluid  clinically.  There is dilation of the duodenal bulb.  The pylorus can be identified proximal to the duodenal bulb.  At the junction of the first and second part of the duodenum, there is narrowing of the duodenum with endoluminal nodular density and circumferential narrowing.  The appearance is compatible with stricture which may be malignant.  In this patient with known metastatic disease, this could represent seeding and invasion or potentially metastatic disease to bowel. Other neoplastic etiologies are considered less likely based on the presence of metastatic disease.  Significantly, the junction of the first and second part of the duodenum is narrowed although contrast does pass distally into the small bowel.  This may account for upper abdominal obstructive symptoms.  Gastroenterology consultation may be helpful to further evaluate, possibly with endoscopy for either dilation or passage of feeding tube distally.  **END ADDENDUM** SIGNED BY: Andreas Newport, M.D.    03/21/2012  *RADIOLOGY REPORT*  Clinical Data:  Follow-up metastatic colon carcinoma.  Recently completed chemotherapy.  CT ABDOMEN WITH CONTRAST  Technique:  Multidetector CT imaging of the abdomen was performed using the standard protocol following bolus administration of intravenous contrast.  Contrast: 80mL OMNIPAQUE IOHEXOL 300 MG/ML IJ SOLN  Comparison:  02/15/2012  Findings:  A hypovascular metastasis in the anterior segment of the right hepatic lobe has increased in size since previous study, now measuring 2.0 x 3.5 cm compared with with 1.7 x 2.0 cm on previous study.  At least two new hypovascular metastases are seen in the dome of the right hepatic lobe which measure 1.9 cm and 2.6 cm in diameter.  The wire to other tiny less than 1 cm low attenuation lesions are seen which likely represent additional metastases.  Mild increase in lymphadenopathy is seen in the porta hepatis and celiac access as well as the portacaval space.  The  largest lymph node in the portacaval space measures 1.7 cm in short axis compared with 1.0 cm on prior study.  A new 1.8 cm mass is also seen involving the left adrenal gland.  The spleen and pancreas remain normal in appearance.  Bilateral renal cysts are stable there is no evidence of renal mass or hydronephrosis.  Mild lymphadenopathy is seen left para-aortic region measuring up to 1.6 cm in short axis, which is increased since previous study.  There is no evidence of inflammatory process or abscess within the abdomen.  No evidence of dilated abdominal bowel loops.  IMPRESSION:  1.  Interval progression of liver metastases since prior study. 2.  New mild upper abdominal and retroperitoneal lymphadenopathy, consistent with metastatic disease. 3.  New small left adrenal mass, consistent with metastatic disease. Original Report Authenticated By: Andreas Newport, M.D.   Dg Chest [facility]  03/28/2012  *RADIOLOGY REPORT*  Clinical Data: Shortness of breath, congestion, history of carcinoma of the colon  PORTABLE CHEST - 1 VIEW  Comparison: Chest x-ray of 03/27/2012 and 01/05/2011  Findings: There is little change in the opacity in the left mid lung base and at the right lung base suspicious for pneumonia. Interval development of pulmonary metastases cannot be excluded as well.  There may be a right effusion present.  Cardiomegaly is stable.  Right-sided power port Port-A-Cath remains with the tip in the lower SVC.  An NG tube is present.  IMPRESSION: Persistent opacity in the left mid lung base and at the right lung base suspicious for pneumonia and possible right effusion.  Cannot exclude development of metastatic disease in this patient with carcinoma of the colon.  Original Report Authenticated By: Juline Patch, M.D.   Dg Abd 2 Views  03/20/2012  *RADIOLOGY REPORT*  Clinical Data: Left side abdominal pain with eating.  No bowel movement for 4 days.  ABDOMEN - 2 VIEW  Comparison: Two views abdomen 03/17/2012  and CT abdomen and pelvis 02/15/2012.  Findings: No free intraperitoneal air is identified.  The bowel gas pattern is unremarkable.  Mild gaseous distention of the colon seen on prior study has decreased.  No evidence of small bowel obstruction.  Cholecystectomy clips noted.  IMPRESSION: Negative exam.  Original Report Authenticated By: Bernadene Bell. Maricela Curet, M.D.   Dg Abd 2 Views  03/17/2012  *RADIOLOGY REPORT*  Clinical Data: History of colon carcinoma.  History of vomiting.  ABDOMEN - 2 VIEW  Comparison: CT 02/15/2012.  Findings: Previous median sternotomy has been performed. Cholecystectomy clips are seen.  There is no evidence of pneumoperitoneum.  Pelvic phleboliths are seen.  There is slight scoliosis convexity to the left.  There is degenerative spondylosis.  Bowel gas pattern is nonspecific.  There is mild gaseous distention of portions of the colon.  Small bowel pattern is within normal limits.  No opaque calculi are seen.  IMPRESSION: No evidence of pneumoperitoneum.  No evidence of bowel obstruction.  Original Report Authenticated By: Crawford Givens, M.D.   Dg Abd Portable 1v  03/24/2012  *RADIOLOGY REPORT*  Clinical Data: Evaluate for barium.  Upper GI today  PORTABLE ABDOMEN - 1 VIEW  Comparison: 03/24/2012  Findings: Barium remains in the antrum of the stomach.  The upper GI suggests an obstruction of the duodenum.  There is now  barium in the small bowel which is nondilated.  A small amount of barium has entered the colon and rectum.  IMPRESSION: There is retained barium in the stomach.  Barium has passed into nondilated small bowel and colon suggesting partial obstruction of the duodenum.  Original Report Authenticated By: Camelia Phenes, M.D.   Varney Biles Kayleen Memos [facility]  03/24/2012  *RADIOLOGY REPORT*  Clinical Data:  History of recurrent carcinoma of the colon with liver metastases, evaluate for possible duodenal obstruction  UPPER GI SERIES WITH KUB  Technique:  Routine upper GI series was performed with  thin barium  Fluoroscopy Time: 3.07 minutes  Comparison:  CT abdomen and pelvis of 03/21/2012  Findings: A preliminary film of the abdomen shows a large amount of gas within the colon and gaseous distention of the stomach as well. No definite distended small bowel is seen.  In view of the distended stomach, thin barium was utilized rather than water soluble contrast.  On the preliminary film there appear to be flowing osteophytes present and probable sacroiliitis most consistent with ankylosing spondylitis.  Barium was given orally and images of the esophagus were obtained. There is a diminished primary peristaltic wave present.  A small hiatal hernia is noted.  With the patient on his back, the barium collected within the fundus of the stomach.  He was then placed in the right lateral decubitus position and monitored for up to 50 minutes.  Barium did enter the duodenal bulb, but did not enter the post bulbar duodenum where a lesion has been noted on recent CT.  IMPRESSION: The patient is obstructed immediately post bulbar.  No barium passes beyond the obstruction over 50 minutes of intermittent fluoroscopic evaluation.  Original Report Authenticated By: Juline Patch, M.D.      ASSESSMENT: 1. Atrial flutter/fib with RVR - heart rate much improved on IV Cardizem gtt  2. Metastatic colon cancer status post gastrojejunostomy for treatment of bowel obstruction  3. Aortic valve disease status post tissue aVR with normal prosthetic valve function  4. Cardiomyopathy with left ventricular ejection fraction 40-45% by echocardiogram in 2012    PLAN:   1.  Continue IV digoxin/metoprolol and Cardizem gtt for rate control and switch to PO once able to take PO 2.  No anticoagulation as per prior notes  Quintella Reichert, MD  03/30/2012  9:43 AM

## 2012-03-30 NOTE — Progress Notes (Signed)
Patient ID: Kyle Love, male   DOB: July 06, 1941, 71 y.o.   MRN: [id number]  Subjective: No events overnight. Patient denies chest pain, shortness of breath, abdominal pain. Had bowel movement and reports ambulating.  Objective:  Vital signs in last 24 hours:  Filed Vitals:   03/30/12 1025 03/30/12 1034 03/30/12 1302 03/30/12 1506  BP: 112/67 117/67 110/67   Pulse: 102 101 83   Temp:   98 F (36.7 C)   TempSrc:   Oral   Resp:   24 18  Height:      Weight:      SpO2:   95% 95%    Intake/Output from previous day:   Intake/Output Summary (Last 24 hours) at 03/30/12 2132 Last data filed at 03/30/12 1900  Gross per 24 hour  Intake   1266 ml  Output   1045 ml  Net    221 ml    Physical Exam: General: Alert, awake, oriented x3, in no acute distress. HEENT: No bruits, no goiter. Moist mucous membranes, no scleral icterus, no conjunctival pallor. Heart: Irregular rate and rhythm, no murmurs, rubs, gallops. Lungs: Clear to auscultation bilaterally with bibasilar crackles. No wheezing, no rhonchi, no rales.  Abdomen: Soft, nontender, nondistended, positive bowel sounds. Biliary drain in place. Extremities: No clubbing or cyanosis, no pitting edema,  positive pedal pulses. Neuro: Grossly nonfocal.  Lab Results:  Lab 03/29/12 0445 03/28/12 0516 03/26/12 0428 03/25/12 0355 03/24/12 0421  WBC 8.3 9.4 9.6 7.5 7.1  HGB 10.9* 12.1* 11.6* 10.9* 11.2*  HCT 31.8* 35.6* 33.5* 32.7* 33.3*  PLT 200 192 193 172 181  MCV 96.4 97.3 96.3 97.9 97.1  MCH 33.0 33.1 33.3 32.6 32.7  MCHC 34.3 34.0 34.6 33.3 33.6  RDW 15.3 15.4 15.5 15.8* 15.7*  LYMPHSABS -- -- -- -- 0.6*  MONOABS -- -- -- -- 0.6  EOSABS -- -- -- -- 0.5  BASOSABS -- -- -- -- 0.0  BANDABS -- -- -- -- --    Lab 03/30/12 0518 03/29/12 0445 03/28/12 0516 03/27/12 0425 03/26/12 0428 03/24/12 0421  NA 131* 132* 132* 134* 132* --  K 4.3 3.7 4.5 4.6 4.0 --  CL 101 102 99 104 103 --  CO2 26 23 26 25 24  --  GLUCOSE 107* 114*  113* 95 130* --  BUN 26* 23 22 21 18  --  CREATININE 0.92 0.71 0.80 0.83 0.77 --  CALCIUM 8.5 8.2* 8.9 8.6 8.5 --  MG -- -- -- 1.8 -- 1.8   No results found for this basename: INR:5,PROTIME:5 in the last 168 hours Cardiac markers:  Lab 03/27/12 0913  CKMB 2.3  TROPONINI <0.30  MYOGLOBIN --    Studies/Results: Dg Chest Port 1 View  03/28/2012  *RADIOLOGY REPORT*  Clinical Data: Shortness of breath, congestion, history of carcinoma of the colon  PORTABLE CHEST - 1 VIEW  Comparison: Chest x-ray of 03/27/2012 and 01/05/2011  Findings: There is little change in the opacity in the left mid lung base and at the right lung base suspicious for pneumonia. Interval development of pulmonary metastases cannot be excluded as well.  There may be a right effusion present.  Cardiomegaly is stable.  Right-sided power port Port-A-Cath remains with the tip in the lower SVC.  An NG tube is present.  IMPRESSION: Persistent opacity in the left mid lung base and at the right lung base suspicious for pneumonia and possible right effusion.  Cannot exclude development of metastatic disease in this patient with carcinoma of  the colon.  Original Report Authenticated By: Juline Patch, M.D.    Medications: Scheduled Meds:   . acetaminophen  1,000 mg Intravenous Q6H  . bisacodyl  10 mg Rectal BID  . digoxin  0.125 mg Intravenous Daily  . enoxaparin  40 mg Subcutaneous Q24H  . HYDROmorphone PCA 0.3 mg/mL   Intravenous Q4H  . HYDROmorphone PCA 0.3 mg/mL      . HYDROmorphone PCA 0.3 mg/mL      . magic mouthwash  10 mL Oral TID  . metoCLOPramide (REGLAN) injection  10 mg Intravenous Q6H  . metoprolol  10 mg Intravenous Q4H  . moxifloxacin  400 mg Intravenous Q24H  . pantoprazole (PROTONIX) IV  40 mg Intravenous Q12H   Continuous Infusions:   . sodium chloride    . diltiazem (CARDIZEM) infusion 5 mg/hr (03/30/12 1245)  . TPN (CLINIMIX) +/- additives 100 mL/hr at 03/29/12 1826  . TPN (CLINIMIX) +/- additives 100  mL/hr at 03/30/12 1804   PRN Meds:.diphenhydrAMINE, diphenhydrAMINE, naloxone, ondansetron (ZOFRAN) IV, ondansetron, sodium chloride, sodium chloride  Assessment/Plan:  Principal Problem:  *Bowel obstruction  - s/p gastrojejunostomy, post op day #5 - pt continues to use PCA pump and denies pain at the moment  - continue to follow up on surgery recommendations  - NGT clamped today, follow up on clinical response  Active Problems:  Atrial fibrillation  - HR in 80's this morning as pt was placed on cardizem drip  - CXR consistent with PNA  - cardiac enzymes x 3 negative   PNA  - continue antibiotic for now day #4 - pt remains afebrile over 24 hour period   COLON CANCER  - no additional recommendations from oncology service   Duodenal stenosis  - s/p gastrojejunostomy, post op day #5  ANEMIA-IRON DEFICIENCY  - Hg/Hct remain stable and at pt's baseline   Hyponatremia  - stable  - BMP in AM   EDUCATION  - test results and diagnostic studies were discussed with patient and pt's family who was present at the bedside (wife)  - patient and family have verbalized the understanding  - questions were answered at the bedside and contact information was provided for additional questions or concerns     LOS: 8 days   [name] 03/30/2012, 9:32 PM  [facility] Pager: [phone]

## 2012-03-30 NOTE — Progress Notes (Signed)
PARENTERAL NUTRITION CONSULT NOTE - FOLLOW UP  Pharmacy Consult for TNA Indication: Duodenal Outlet Obstruction  Allergies  Allergen Reactions  . Diltiazem Hcl     REACTION: Severe lower extremity edema on generic cardizem  . Penicillins     Pt can tolerate cephalosporins/Pt tol Zosyn 11/01/11  . Plasma Human     Patient Measurements: Height: 5\' 10"  (177.8 cm) Weight: 188 lb 4.4 oz (85.4 kg) IBW/kg (Calculated) : 73  Adjusted Body Weight: 75 kg Recent weight loss: ~13 lb over 2 weeks  Vital Signs: Temp: 97.9 F (36.6 C) (04/07 0600) Temp src: Oral (04/07 0600) BP: 130/70 mmHg (04/07 0600) Pulse Rate: 86  (04/07 0600)  Intake/Output from previous day: 04/06 0701 - 04/07 0700 In: 3599.3 [NG/GT:570; IV Piggyback:666; ZOX:0960.4] Out: 1425 [Urine:900; Emesis/NG output:525]  Labs:  [facility] 03/29/12 0445 03/28/12 0516  WBC 8.3 9.4  HGB 10.9* 12.1*  HCT 31.8* 35.6*  PLT 200 192  APTT -- --  INR -- --    Basename 03/30/12 0518 03/29/12 0445 03/28/12 0516  NA 131* 132* 132*  K 4.3 3.7 4.5  CL 101 102 99  CO2 26 23 26   GLUCOSE 107* 114* 113*  BUN 26* 23 22  CREATININE 0.92 0.71 0.80  LABCREA -- -- --  CREAT24HRUR -- -- --  CALCIUM 8.5 8.2* 8.9  MG -- -- --  PHOS -- -- --  PROT -- -- --  ALBUMIN -- -- --  AST -- -- --  ALT -- -- --  ALKPHOS -- -- --  BILITOT -- -- --  BILIDIR -- -- --  IBILI -- -- --  PREALBUMIN -- -- --  TRIG -- -- --  CHOLHDL -- -- --  CHOL -- -- --   Estimated Creatinine Clearance: 74.9 ml/min (by C-G formula based on Cr of 0.92).   Insulin Requirements in the past 24 hours:  No sliding scale ordered currently, CBGs < 150s  Current Nutrition:  Clinimix E5/20 at 100 ml/hr Lipid emulsion 20% at 90ml/hr MWF only CLD, starting 4/7  IVF: NS at [facility]  Assessment:  70 YOM with metastatic colon cancer, CT scan 3/8 with dilated duodenal bulb, possible malignant obstruction, liver masses.  EGD 3/31, with duodenal outlet obstruction,  attempted made for dilatation with balloon but lumen re-closed after balloon deflated. UGI 4/1, with complete obstruction in the post bulbar duodenum.  Patient is s/p exp lap and gastrojejunostomy 4/2.    TNA goal rate adjusted and advanced to goal 4/5 Na slightly low but unable to adjust in TNA bag.  Other electrolytes, cholesterol, TGs wnl.  Prealbumin down:  13 (3/30), 12 (3/31), 10.4 (4/1) LFTs elevated prior to starting TNA, now improving.  Alk phos high but trending down.  Monitor this closely, f/u TNA labs in AM. Lipid emulsion currently available for daily dosing, but due to high protein needs will not give daily to prevent excess kcal.  Nutritional Goals:   Re-estimated recs per RD 4/4: Kcal: 2250-2500, Protein: 120-145g, Fluid: 2.2-2.5L  Clinimix E5/20 at goal rate of 100 ml/hr + Lipids 20% at 54ml/hr (MWF only) = 120 g protein (1.4 g/kg/day), average of 2318 kcal/day  (2592 kcal MWF, 2112 kcal TTSS)  Plan:   TNA goal rate Clinimix E 5/20 at 100 ml/hr  Continue Lipid emulsion 20% at 24ml/hr only MWF  MVI and trace elements MWF only due to ongoing shortage  Continue CBGs q8h, no SSI ordered/required at this time  TNA lab panels on Mondays & Thursdays.  Clance Boll, PharmD, BCPS Pager: [phone] 03/30/2012 7:51 AM

## 2012-03-31 DIAGNOSIS — I4891 Unspecified atrial fibrillation: Secondary | ICD-10-CM

## 2012-03-31 LAB — CBC
HCT: 29.6 % — ABNORMAL LOW (ref 39.0–52.0)
Hemoglobin: 10.2 g/dL — ABNORMAL LOW (ref 13.0–17.0)
MCH: 32.5 pg (ref 26.0–34.0)
MCHC: 34.5 g/dL (ref 30.0–36.0)
MCV: 94.3 fL (ref 78.0–100.0)
Platelets: 175 K/uL (ref 150–400)
RBC: 3.14 MIL/uL — ABNORMAL LOW (ref 4.22–5.81)
RDW: 15 % (ref 11.5–15.5)
WBC: 8 K/uL (ref 4.0–10.5)

## 2012-03-31 LAB — COMPREHENSIVE METABOLIC PANEL WITH GFR
ALT: 58 U/L — ABNORMAL HIGH (ref 0–53)
AST: 62 U/L — ABNORMAL HIGH (ref 0–37)
Albumin: 1.9 g/dL — ABNORMAL LOW (ref 3.5–5.2)
Alkaline Phosphatase: 161 U/L — ABNORMAL HIGH (ref 39–117)
BUN: 30 mg/dL — ABNORMAL HIGH (ref 6–23)
CO2: 24 meq/L (ref 19–32)
Calcium: 8.5 mg/dL (ref 8.4–10.5)
Chloride: 101 meq/L (ref 96–112)
Creatinine, Ser: 0.89 mg/dL (ref 0.50–1.35)
GFR calc Af Amer: >90 mL/min
GFR calc non Af Amer: 83 mL/min — ABNORMAL LOW
Glucose, Bld: 126 mg/dL — ABNORMAL HIGH (ref 70–99)
Potassium: 4.2 meq/L (ref 3.5–5.1)
Sodium: 130 meq/L — ABNORMAL LOW (ref 135–145)
Total Bilirubin: 0.5 mg/dL (ref 0.3–1.2)
Total Protein: 6.1 g/dL (ref 6.0–8.3)

## 2012-03-31 LAB — DIFFERENTIAL
Basophils Absolute: 0 K/uL (ref 0.0–0.1)
Basophils Relative: 0 % (ref 0–1)
Eosinophils Absolute: 0.2 K/uL (ref 0.0–0.7)
Eosinophils Relative: 3 % (ref 0–5)
Lymphocytes Relative: 7 % — ABNORMAL LOW (ref 12–46)
Lymphs Abs: 0.6 K/uL — ABNORMAL LOW (ref 0.7–4.0)
Monocytes Absolute: 0.9 K/uL (ref 0.1–1.0)
Monocytes Relative: 12 % (ref 3–12)
Neutro Abs: 6.2 K/uL (ref 1.7–7.7)
Neutrophils Relative %: 78 % — ABNORMAL HIGH (ref 43–77)

## 2012-03-31 LAB — MAGNESIUM: Magnesium: 1.8 mg/dL (ref 1.5–2.5)

## 2012-03-31 LAB — PREALBUMIN: Prealbumin: 7.9 mg/dL — ABNORMAL LOW (ref 17.0–34.0)

## 2012-03-31 LAB — GLUCOSE, CAPILLARY: Glucose-Capillary: 126 mg/dL — ABNORMAL HIGH (ref 70–99)

## 2012-03-31 LAB — CHOLESTEROL, TOTAL: Cholesterol: 88 mg/dL (ref 0–200)

## 2012-03-31 LAB — PHOSPHORUS: Phosphorus: 3.4 mg/dL (ref 2.3–4.6)

## 2012-03-31 LAB — TRIGLYCERIDES: Triglycerides: 58 mg/dL (ref <150)

## 2012-03-31 MED ORDER — SIMETHICONE 80 MG PO CHEW
80.0000 mg | CHEWABLE_TABLET | Freq: Four times a day (QID) | ORAL | Status: DC | PRN
  Administered 2012-03-31 – 2012-04-03 (×2): 80 mg via ORAL
  Filled 2012-03-31 (×3): qty 1

## 2012-03-31 MED ORDER — BOOST / RESOURCE BREEZE PO LIQD
1.0000 | Freq: Three times a day (TID) | ORAL | Status: DC
  Administered 2012-03-31 – 2012-04-05 (×5): 1 via ORAL

## 2012-03-31 MED ORDER — M.V.I. ADULT IV INJ
INJECTION | INTRAVENOUS | Status: AC
  Administered 2012-03-31: 18:00:00 via INTRAVENOUS
  Filled 2012-03-31: qty 2400

## 2012-03-31 MED ORDER — HYDROMORPHONE 0.3 MG/ML IV SOLN
INTRAVENOUS | Status: AC
  Administered 2012-03-31: 11:00:00
  Filled 2012-03-31: qty 25

## 2012-03-31 MED ORDER — FAT EMULSION 20 % IV EMUL
240.0000 mL | INTRAVENOUS | Status: AC
  Administered 2012-03-31: 240 mL via INTRAVENOUS
  Filled 2012-03-31: qty 250

## 2012-03-31 NOTE — Progress Notes (Signed)
6 Days Post-Op  Subjective: He feels OK and would like NG out. Has been clamped 24H without problem and he is taking sips of liquids OK. Had another BM and continues to pass gas. Minimal incisional pain.  Objective: Vital signs in last 24 hours: Temp:  [97.7 F (36.5 C)-98 F (36.7 C)] 97.7 F (36.5 C) (04/08 0525) Pulse Rate:  [83-102] 99  (04/08 0525) Resp:  [18-24] 20  (04/08 0525) BP: (110-148)/(57-72) 137/57 mmHg (04/08 0525) SpO2:  [93 %-99 %] 93 % (04/08 0525)   Intake/Output from previous day: 04/07 0701 - 04/08 0700 In: 804 [P.O.:490; NG/GT:60; IV Piggyback:254] Out: 571 [Urine:500; Emesis/NG output:70; Stool:1] Intake/Output this shift:     General appearance: alert, no distress and fatigued Resp: clear to auscultation bilaterally GI: soft, non-tender; bowel sounds normal; no masses,  no organomegaly  Incision: healing well  Lab Results:   Basename 03/31/12 0507 03/29/12 0445  WBC 8.0 8.3  HGB 10.2* 10.9*  HCT 29.6* 31.8*  PLT 175 200   BMET  Basename 03/31/12 0507 03/30/12 0518  NA 130* 131*  K 4.2 4.3  CL 101 101  CO2 24 26  GLUCOSE 126* 107*  BUN 30* 26*  CREATININE 0.89 0.92  CALCIUM 8.5 8.5   PT/INR No results found for this basename: LABPROT:2,INR:2 in the last 72 hours ABG No results found for this basename: PHART:2,PCO2:2,PO2:2,HCO3:2 in the last 72 hours  MEDS, Scheduled    . bisacodyl  10 mg Rectal BID  . digoxin  0.125 mg Intravenous Daily  . enoxaparin  40 mg Subcutaneous Q24H  . HYDROmorphone PCA 0.3 mg/mL   Intravenous Q4H  . HYDROmorphone PCA 0.3 mg/mL      . magic mouthwash  10 mL Oral TID  . metoCLOPramide (REGLAN) injection  10 mg Intravenous Q6H  . metoprolol  10 mg Intravenous Q4H  . moxifloxacin  400 mg Intravenous Q24H  . pantoprazole (PROTONIX) IV  40 mg Intravenous Q12H    Studies/Results: No results found.  Assessment: s/p Procedure(s): GASTROJEJUNOSTOMY HERNIA REPAIR VENTRAL ADULT Appears to be emptying  stomach OK. Wound healing nicely  Plan: Will D/C NG, keep on clears, remover alternate staples   LOS: 9 days     Currie Paris, MD, [facility], [place] [phone]   03/31/2012 9:14 AM

## 2012-03-31 NOTE — Progress Notes (Signed)
Patient ID: Kyle Love, male   DOB: Apr 25, 1941, 71 y.o.   MRN: [id number]  Subjective: No events overnight. Patient denies chest pain, shortness of breath, abdominal pain.   Objective:  Vital signs in last 24 hours:  Filed Vitals:   03/31/12 1028 03/31/12 1221 03/31/12 1338 03/31/12 1605  BP: 127/71  133/73 132/67  Pulse: 82  80 86  Temp: 97.9 F (36.6 C)  98.4 F (36.9 C)   TempSrc: Oral  Oral   Resp: 20 26 22    Height:      Weight:      SpO2: 94% 97% 95% 97%    Intake/Output from previous day:   Intake/Output Summary (Last 24 hours) at 03/31/12 1754 Last data filed at 03/31/12 0532  Gross per 24 hour  Intake    150 ml  Output    501 ml  Net   -351 ml    Physical Exam: General: Alert, awake, oriented x3, in no acute distress. HEENT: No bruits, no goiter. Moist mucous membranes, no scleral icterus, no conjunctival pallor. Heart: Regular rate and rhythm, S1/S2 +, no murmurs, rubs, gallops. Lungs: Clear to auscultation bilaterally with minimal bibasilar crackles. No wheezing, no rhonchi, no rales.  Abdomen: Soft, nontender, nondistended, positive bowel sounds. Extremities: No clubbing or cyanosis, no pitting edema,  positive pedal pulses. Neuro: Grossly nonfocal.  Lab Results:  Lab 03/31/12 0507 03/29/12 0445 03/28/12 0516 03/26/12 0428 03/25/12 0355  WBC 8.0 8.3 9.4 9.6 7.5  HGB 10.2* 10.9* 12.1* 11.6* 10.9*  HCT 29.6* 31.8* 35.6* 33.5* 32.7*  PLT 175 200 192 193 172    Lab 03/31/12 0507 03/30/12 0518 03/29/12 0445 03/28/12 0516 03/27/12 0425  NA 130* 131* 132* 132* 134*  K 4.2 4.3 3.7 4.5 4.6  CL 101 101 102 99 104  CO2 24 26 23 26 25   GLUCOSE 126* 107* 114* 113* 95  BUN 30* 26* 23 22 21   CREATININE 0.89 0.92 0.71 0.80 0.83  CALCIUM 8.5 8.5 8.2* 8.9 8.6  MG 1.8 -- -- -- 1.8   No results found for this basename: INR:5,PROTIME:5 in the last 168 hours Cardiac markers:  Lab 03/27/12 0913  CKMB 2.3  TROPONINI <0.30  MYOGLOBIN --   No components  found with this basename: POCBNP:3 Recent Results (from the past 240 hour(s))  SURGICAL PCR SCREEN     Status: Normal   Collection Time   03/23/12  8:05 AM      Component Value Range Status Comment   MRSA, PCR NEGATIVE  NEGATIVE  Final    Staphylococcus aureus NEGATIVE  NEGATIVE  Final   SURGICAL PCR SCREEN     Status: Normal   Collection Time   03/25/12 11:32 AM      Component Value Range Status Comment   MRSA, PCR NEGATIVE  NEGATIVE  Final    Staphylococcus aureus NEGATIVE  NEGATIVE  Final     Studies/Results: No results found.  Medications: Scheduled Meds:   . bisacodyl  10 mg Rectal BID  . digoxin  0.125 mg Intravenous Daily  . enoxaparin  40 mg Subcutaneous Q24H  . feeding supplement  1 Container Oral TID BM  . HYDROmorphone PCA 0.3 mg/mL   Intravenous Q4H  . HYDROmorphone PCA 0.3 mg/mL      . HYDROmorphone PCA 0.3 mg/mL      . magic mouthwash  10 mL Oral TID  . metoCLOPramide (REGLAN) injection  10 mg Intravenous Q6H  . metoprolol  10 mg Intravenous Q4H  .  moxifloxacin  400 mg Intravenous Q24H  . pantoprazole (PROTONIX) IV  40 mg Intravenous Q12H   Continuous Infusions:   . sodium chloride    . diltiazem (CARDIZEM) infusion 5 mg/hr (03/30/12 1245)  . fat emulsion    . TPN (CLINIMIX) +/- additives 100 mL/hr at 03/29/12 1826  . TPN (CLINIMIX) +/- additives 100 mL/hr at 03/30/12 1804  . TPN (CLINIMIX) +/- additives     PRN Meds:.diphenhydrAMINE, diphenhydrAMINE, naloxone, ondansetron (ZOFRAN) IV, ondansetron, simethicone, sodium chloride, sodium chloride  Assessment/Plan:  Principal Problem:  *Bowel obstruction  - s/p gastrojejunostomy, post op day #6 - pt continues to use PCA pump and denies pain at the moment  - continue to follow up on surgery recommendations  - NGT out today and pt tolerating well - will continue to follow up on surgery recommendations  Active Problems:  Atrial fibrillation  - HR in 80's this morning as pt was placed on cardizem drip  -  CXR consistent with PNA  - cardiac enzymes x 3 negative   PNA  - continue antibiotic for now day #5 - will plan d/c in am - pt remains afebrile over 24 hour period   COLON CANCER  - no additional recommendations from oncology service   Duodenal stenosis  - s/p gastrojejunostomy, post op day #6  ANEMIA-IRON DEFICIENCY  - Hg/Hct remain stable and at pt's baseline   Hyponatremia  - stable  - BMP in AM   EDUCATION  - test results and diagnostic studies were discussed with patient and pt's family who was present at the bedside (wife)  - patient and family have verbalized the understanding  - questions were answered at the bedside and contact information was provided for additional questions or concerns    LOS: 9 days   MAGICK-Chang Tiggs 03/31/2012, 5:54 PM  [facility] Pager: [phone]

## 2012-03-31 NOTE — Progress Notes (Signed)
SUBJECTIVE:  Patient denies CP or SOB; mild abdominal pain  OBJECTIVE:   Vitals:   Filed Vitals:   03/30/12 2000 03/30/12 2148 03/31/12 0204 03/31/12 0525  BP:  148/72 130/70 137/57  Pulse:  88 97 99  Temp:  97.8 F (36.6 C)  97.7 F (36.5 C)  TempSrc:  Oral  Oral  Resp: 18 20  20   Height:      Weight:      SpO2: 96% 99%  93%   I&O's:    Intake/Output Summary (Last 24 hours) at 03/31/12 0709 Last data filed at 03/31/12 0532  Gross per 24 hour  Intake    804 ml  Output    571 ml  Net    233 ml   TELEMETRY: Reviewed telemetry pt in atrial fibrillation     PHYSICAL EXAM General: Well developed, well nourished, in no acute distress Lungs:   Mild exp wheeze Heart:   Irregularly irregular  Abdomen: Bowel sounds are positive, mildly distended; s/p abdominal surgery Extremities:   No edema.   Neuro: Alert and oriented X 3.   LABS: Basic Metabolic Panel:  Basename 03/31/12 0507 03/30/12 0518  NA 130* 131*  K 4.2 4.3  CL 101 101  CO2 24 26  GLUCOSE 126* 107*  BUN 30* 26*  CREATININE 0.89 0.92  CALCIUM 8.5 8.5  MG 1.8 --  PHOS 3.4 --   CBC:  Basename 03/31/12 0507 03/29/12 0445  WBC 8.0 8.3  NEUTROABS 6.2 --  HGB 10.2* 10.9*  HCT 29.6* 31.8*  MCV 94.3 96.4  PLT 175 200   Coag Panel:   Lab Results  Component Value Date   INR 1.05 05/31/2011   INR 1.09 05/21/2011   INR 2.08* 05/11/2011         ASSESSMENT: 1. Atrial flutter/fib with RVR - HR controlled; continue IV dig, cardizem and lopressor; change to PO when tolerating. No coumadin given CA and ongoing need for chemotherapy. 2. Metastatic colon cancer status post gastrojejunostomy for treatment of bowel obstruction  3. Aortic valve disease status post tissue aVR with normal prosthetic valve function  4. Cardiomyopathy with left ventricular ejection fraction 40-45% by echocardiogram in 2012   Olga Millers, MD  03/31/2012  7:09 AM

## 2012-03-31 NOTE — Progress Notes (Signed)
Nutrition Follow-up  NG tube out, another BM  Drinking Resource Breeze "first thing I have had that has any taste to it"  Diet Order:  Clear liquid-tolerating well  TPN:  Clinimix E 5/20 at 100 ml/hr.  Lipid emulsion 20% at 10 ml/hr MWF.  MVI and trace elements MWF only due to ongoing shortage. Providing a daily average of 2318 kcals, 120 grams protein.  Meds: Scheduled Meds:   . bisacodyl  10 mg Rectal BID  . digoxin  0.125 mg Intravenous Daily  . enoxaparin  40 mg Subcutaneous Q24H  . HYDROmorphone PCA 0.3 mg/mL   Intravenous Q4H  . HYDROmorphone PCA 0.3 mg/mL      . HYDROmorphone PCA 0.3 mg/mL      . magic mouthwash  10 mL Oral TID  . metoCLOPramide (REGLAN) injection  10 mg Intravenous Q6H  . metoprolol  10 mg Intravenous Q4H  . moxifloxacin  400 mg Intravenous Q24H  . pantoprazole (PROTONIX) IV  40 mg Intravenous Q12H   Continuous Infusions:   . sodium chloride    . diltiazem (CARDIZEM) infusion 5 mg/hr (03/30/12 1245)  . fat emulsion    . TPN (CLINIMIX) +/- additives 100 mL/hr at 03/29/12 1826  . TPN (CLINIMIX) +/- additives 100 mL/hr at 03/30/12 1804  . TPN (CLINIMIX) +/- additives     PRN Meds:.diphenhydrAMINE, diphenhydrAMINE, naloxone, ondansetron (ZOFRAN) IV, ondansetron, simethicone, sodium chloride, sodium chloride  Labs:  CMP     Component Value Date/Time   NA 130* 03/31/2012 0507   K 4.2 03/31/2012 0507   CL 101 03/31/2012 0507   CO2 24 03/31/2012 0507   GLUCOSE 126* 03/31/2012 0507   BUN 30* 03/31/2012 0507   CREATININE 0.89 03/31/2012 0507   CALCIUM 8.5 03/31/2012 0507   PROT 6.1 03/31/2012 0507   ALBUMIN 1.9* 03/31/2012 0507   AST 62* 03/31/2012 0507   ALT 58* 03/31/2012 0507   ALKPHOS 161* 03/31/2012 0507   BILITOT 0.5 03/31/2012 0507   GFRNONAA 83* 03/31/2012 0507   GFRAA >90 03/31/2012 0507     Intake/Output Summary (Last 24 hours) at 03/31/12 1548 Last data filed at 03/31/12 0532  Gross per 24 hour  Intake    150 ml  Output    501 ml  Net   -351 ml    Weight  Status:  No new Wt Readings from Last 3 Encounters:  03/26/12 188 lb 4.4 oz (85.4 kg)  03/26/12 188 lb 4.4 oz (85.4 kg)  03/26/12 188 lb 4.4 oz (85.4 kg)    Re-estimated needs:  (unchanged)-2250-2500 kcals, 120-145 g protein  Nutrition Dx:  Altered GI function--improving  Goal:  TPA to meet >90% estimated nutrition needs-currently met.  New goal:  Transition to po  Intervention:   1. TPN per pharmacy 2.  Diet advancement per MD 3.  Monitor diet advancement 4.  [employer]reeze tid  Monitor:  Weights, labs, intake, tpn   Jeoffrey Massed Pager #:  [phone]

## 2012-03-31 NOTE — Progress Notes (Signed)
PARENTERAL NUTRITION CONSULT NOTE - FOLLOW UP  Pharmacy Consult for TNA Indication: Duodenal Outlet Obstruction  Allergies  Allergen Reactions  . Diltiazem Hcl     REACTION: Severe lower extremity edema on generic cardizem  . Penicillins     Pt can tolerate cephalosporins/Pt tol Zosyn 11/01/11  . Plasma Human     Patient Measurements: Height: 5\' 10"  (177.8 cm) Weight: 188 lb 4.4 oz (85.4 kg) IBW/kg (Calculated) : 73  Adjusted Body Weight: 75 kg Recent weight loss: ~13 lb over 2 weeks  Vital Signs: Temp: 97.7 F (36.5 C) (04/08 0525) Temp src: Oral (04/08 0525) BP: 137/57 mmHg (04/08 0525) Pulse Rate: 99  (04/08 0525)  Intake/Output from previous day: 04/07 0701 - 04/08 0700 In: 804 [P.O.:490; NG/GT:60; IV Piggyback:254] Out: 571 [Urine:500; Emesis/NG output:70; Stool:1]  Labs:  [place] 03/31/12 0507 03/29/12 0445  WBC 8.0 8.3  HGB 10.2* 10.9*  HCT 29.6* 31.8*  PLT 175 200  APTT -- --  INR -- --    Basename 03/31/12 0507 03/30/12 0518 03/29/12 0445  NA 130* 131* 132*  K 4.2 4.3 3.7  CL 101 101 102  CO2 24 26 23   GLUCOSE 126* 107* 114*  BUN 30* 26* 23  CREATININE 0.89 0.92 0.71  LABCREA -- -- --  CREAT24HRUR -- -- --  CALCIUM 8.5 8.5 8.2*  MG 1.8 -- --  PHOS 3.4 -- --  PROT 6.1 -- --  ALBUMIN 1.9* -- --  AST 62* -- --  ALT 58* -- --  ALKPHOS 161* -- --  BILITOT 0.5 -- --  BILIDIR -- -- --  IBILI -- -- --  PREALBUMIN -- -- --  TRIG 58 -- --  CHOLHDL -- -- --  CHOL 88 -- --   Estimated Creatinine Clearance: 77.5 ml/min (by C-G formula based on Cr of 0.89).   Insulin Requirements in the past 24 hours:  No sliding scale ordered currently, CBGs < 150s  Nutritional Goals:   Re-estimated recs per RD 4/4: Kcal: 2250-2500, Protein: 120-145g, Fluid: 2.2-2.5L  Clinimix E5/20 at goal rate of 100 ml/hr + Lipids 20% at 44ml/hr (MWF only) = 120 g protein (1.4 g/kg/day), average of 2318 kcal/day  (2592 kcal MWF, 2112 kcal TTSS)  Current Nutrition:    Clinimix E5/20 at 100 ml/hr Lipid emulsion 20% at 47ml/hr MWF only CLD, starting 4/7  IVF: NS at [facility]  Assessment: 70 YOM with metastatic colon cancer, CT scan 3/8 with dilated duodenal bulb, possible malignant obstruction, liver masses.  EGD 3/31, with duodenal outlet obstruction, attempted made for dilatation with balloon but lumen re-closed after balloon deflated. UGI 4/1, with complete obstruction in the post bulbar duodenum.  Patient is s/p exp lap and gastrojejunostomy 4/2.  TNA goal rate adjusted and advanced to goal 4/5. Lipid emulsion currently available for daily dosing, but due to high protein needs will not give daily to prevent excess kcal.  Lytes: Na remains low, unable to adjust TNA content, other lytes wnl LFTs: LFTs elevated prior to starting TNA, now slight bump in AST/ALT, alk Phos --> monitor CBGs: In goal range <150 PO: recorded 4/7 IVF: kvo'd PPx: Reglan IV 4x daily, IV Protonix BID  Plan:  1) Continue TNA goal rate Clinimix E 5/20 at 100 ml/hr 2) Continue Lipid emulsion 20% at 43ml/hr only MWF 3) MVI and trace elements MWF only due to ongoing shortage 4) Continue CBGs q8h, no SSI ordered/required at this time 5) TNA lab panels on Mondays & Thursdays. - watch  LFTs  Darrol Angel, PharmD Pager: [phone] 03/31/2012 8:45 AM

## 2012-04-01 LAB — BASIC METABOLIC PANEL
Calcium: 8.3 mg/dL — ABNORMAL LOW (ref 8.4–10.5)
Creatinine, Ser: 0.88 mg/dL (ref 0.50–1.35)
GFR calc Af Amer: >90 mL/min (ref >90)
GFR calc non Af Amer: 84 mL/min — ABNORMAL LOW (ref >90)
Sodium: 130 mEq/L — ABNORMAL LOW (ref 135–145)

## 2012-04-01 LAB — CBC
MCH: 32.5 pg (ref 26.0–34.0)
MCHC: 33.9 g/dL (ref 30.0–36.0)
MCV: 95.8 fL (ref 78.0–100.0)
Platelets: 188 10*3/uL (ref 150–400)
RBC: 3.11 MIL/uL — ABNORMAL LOW (ref 4.22–5.81)
RDW: 14.9 % (ref 11.5–15.5)

## 2012-04-01 LAB — GLUCOSE, CAPILLARY
Glucose-Capillary: 122 mg/dL — ABNORMAL HIGH (ref 70–99)
Glucose-Capillary: 111 mg/dL — ABNORMAL HIGH (ref 70–99)

## 2012-04-01 MED ORDER — DIGOXIN 125 MCG PO TABS
0.1250 mg | ORAL_TABLET | Freq: Every day | ORAL | Status: DC
  Administered 2012-04-01 – 2012-04-08 (×8): 0.125 mg via ORAL
  Filled 2012-04-01 (×8): qty 1

## 2012-04-01 MED ORDER — FUROSEMIDE 10 MG/ML IJ SOLN
20.0000 mg | Freq: Once | INTRAMUSCULAR | Status: AC
  Administered 2012-04-01: 20 mg via INTRAVENOUS
  Filled 2012-04-01: qty 2

## 2012-04-01 MED ORDER — HYDROMORPHONE 0.3 MG/ML IV SOLN
INTRAVENOUS | Status: AC
  Administered 2012-04-01: 0.6 mg
  Filled 2012-04-01: qty 25

## 2012-04-01 MED ORDER — CLINIMIX E/DEXTROSE (5/20) 5 % IV SOLN
INTRAVENOUS | Status: AC
  Filled 2012-04-01: qty 2400

## 2012-04-01 MED ORDER — METOPROLOL TARTRATE 50 MG PO TABS
50.0000 mg | ORAL_TABLET | Freq: Two times a day (BID) | ORAL | Status: DC
  Administered 2012-04-01 (×2): 50 mg via ORAL
  Filled 2012-04-01 (×5): qty 1

## 2012-04-01 NOTE — Progress Notes (Signed)
PARENTERAL NUTRITION CONSULT NOTE - FOLLOW UP  Pharmacy Consult for TNA Indication: Duodenal Outlet Obstruction  Allergies  Allergen Reactions  . Diltiazem Hcl     REACTION: Severe lower extremity edema on generic cardizem  . Penicillins     Pt can tolerate cephalosporins/Pt tol Zosyn 11/01/11  . Plasma Human     Patient Measurements: Height: 5\' 10"  (177.8 cm) Weight: 199 lb 8.3 oz (90.5 kg) IBW/kg (Calculated) : 73  Adjusted Body Weight: 75 kg Recent weight loss: ~13 lb over 2 weeks  Vital Signs: Temp: 98.2 F (36.8 C) (04/09 0615) Temp src: Oral (04/09 0615) BP: 131/73 mmHg (04/09 0615) Pulse Rate: 97  (04/09 0615)  Intake/Output from previous day: 04/08 0701 - 04/09 0700 In: 1381 [P.O.:60; I.V.:20; IV Piggyback:14; TPN:1287] Out: 800 [Urine:800]  Labs:  [facility] 04/01/12 0644 03/31/12 0507  WBC 8.9 8.0  HGB 10.1* 10.2*  HCT 29.8* 29.6*  PLT 188 175  APTT -- --  INR -- --    Basename 04/01/12 0644 03/31/12 0507 03/30/12 0518  NA 130* 130* 131*  K 4.3 4.2 4.3  CL 99 101 101  CO2 24 24 26   GLUCOSE 116* 126* 107*  BUN 27* 30* 26*  CREATININE 0.88 0.89 0.92  LABCREA -- -- --  CREAT24HRUR -- -- --  CALCIUM 8.3* 8.5 8.5  MG -- 1.8 --  PHOS -- 3.4 --  PROT -- 6.1 --  ALBUMIN -- 1.9* --  AST -- 62* --  ALT -- 58* --  ALKPHOS -- 161* --  BILITOT -- 0.5 --  BILIDIR -- -- --  IBILI -- -- --  PREALBUMIN -- 7.9* --  TRIG -- 58 --  CHOLHDL -- -- --  CHOL -- 88 --   Estimated Creatinine Clearance: 85.9 ml/min (by C-G formula based on Cr of 0.88).   Insulin Requirements in the past 24 hours:  No sliding scale ordered currently, CBGs < 150s  Nutritional Goals:   Re-estimated recs per RD 4/4: Kcal: 2250-2500, Protein: 120-145g, Fluid: 2.2-2.5L  Clinimix E5/20 at goal rate of 100 ml/hr + Lipids 20% at 57ml/hr (MWF only) = 120 g protein (1.4 g/kg/day), average of 2318 kcal/day  (2592 kcal MWF, 2112 kcal TTSS)  Current Nutrition:  Clinimix E5/20 at 100  ml/hr Lipid emulsion 20% at 70ml/hr MWF only CLD, starting 4/7  IVF: NS at [facility]  Assessment: 70 YOM with metastatic colon cancer, CT scan 3/8 with dilated duodenal bulb, possible malignant obstruction, liver masses.  EGD 3/31, with duodenal outlet obstruction, attempted made for dilatation with balloon but lumen re-closed after balloon deflated. UGI 4/1, with complete obstruction in the post bulbar duodenum.  Patient is s/p exp lap and gastrojejunostomy 4/2.  TNA goal rate adjusted and advanced to goal 4/5. Lipid emulsion currently available for daily dosing, but due to high protein needs will not give daily to prevent excess kcal. Appears to be tolerating and taking in clear liquids.  Lytes: Na remains low, unable to adjust TNA content. Corrected Ca= 10 (wnl). LFTs: LFTs elevated prior to starting TNA, now slight bump in AST/ALT, alk Phos --> monitor CBGs: In goal range <150 PO: only 60ml recorded 4/8 (down from recorded 4/7) IVF: kvo'd PPx: Reglan IV 4x daily, IV Protonix BID  Plan:  1) Continue Clinimix E 5/20 at 100 ml/hr (goal rate) 2) Continue Lipid emulsion 20% at 84ml/hr only MWF (to avoid over-feeding) 3) MVI and trace elements MWF only due to ongoing shortage 4) Continue CBGs q8h, no  SSI ordered/required at this time 5) TNA lab panels on Mondays & Thursdays. - watch LFTs 6) Await further diet advancement and hopefully begin weaning TNA soon.  Darrol Angel, PharmD Pager: [phone] 04/01/2012 8:02 AM

## 2012-04-01 NOTE — Progress Notes (Signed)
Patient vomited   yellowish color emesis  in moderate amount, PRN meds given verbalized with relief.

## 2012-04-01 NOTE — Progress Notes (Signed)
Patient refused to ambulate 

## 2012-04-01 NOTE — Progress Notes (Signed)
SUBJECTIVE:  Patient denies CP or SOB; mild abdominal pain  OBJECTIVE:   Vitals:   Filed Vitals:   03/31/12 2110 04/01/12 0000 04/01/12 0343 04/01/12 0351  BP: 137/75  145/76   Pulse: 98  105   Temp: 98.1 F (36.7 C)     TempSrc: Oral     Resp: 22 24  24   Height:      Weight:      SpO2: 99% 99%  97%   I&O's:    Intake/Output Summary (Last 24 hours) at 04/01/12 1610 Last data filed at 04/01/12 0416  Gross per 24 hour  Intake    532 ml  Output    800 ml  Net   -268 ml   TELEMETRY: Reviewed telemetry pt in atrial fibrillation     PHYSICAL EXAM General: Well developed, well nourished, in no acute distress Lungs:   Mild exp wheeze and rhonchi Heart:   Irregularly irregular  Abdomen: Bowel sounds are positive, mildly distended; s/p abdominal surgery Extremities:  Trace edema.   Neuro: Alert and oriented X 3.   LABS: Basic Metabolic Panel:  Basename 03/31/12 0507 03/30/12 0518  NA 130* 131*  K 4.2 4.3  CL 101 101  CO2 24 26  GLUCOSE 126* 107*  BUN 30* 26*  CREATININE 0.89 0.92  CALCIUM 8.5 8.5  MG 1.8 --  PHOS 3.4 --   CBC:  Basename 03/31/12 0507  WBC 8.0  NEUTROABS 6.2  HGB 10.2*  HCT 29.6*  MCV 94.3  PLT 175   Coag Panel:   Lab Results  Component Value Date   INR 1.05 05/31/2011   INR 1.09 05/21/2011   INR 2.08* 05/11/2011         ASSESSMENT: 1. Atrial flutter/fib with RVR - HR controlled; change IV meds to po. No coumadin given CA and ongoing need for chemotherapy. 2. Metastatic colon cancer status post gastrojejunostomy for treatment of bowel obstruction  3. Aortic valve disease status post tissue aVR with normal prosthetic valve function  4. Cardiomyopathy with left ventricular ejection fraction 40-45% by echocardiogram in 2012; mildly volume overloaded; lasix 20 mg IV x 1.  Olga Millers, MD  04/01/2012  6:05 AM

## 2012-04-01 NOTE — Progress Notes (Signed)
7 Days Post-Op  Subjective: Feels ok - had another small BM. No nausea with ng out. Voiding well, pain controlled.  Objective: Vital signs in last 24 hours: Temp:  [97.9 F (36.6 C)-98.4 F (36.9 C)] 98.2 F (36.8 C) (04/09 0615) Pulse Rate:  [80-105] 97  (04/09 0615) Resp:  [18-26] 22  (04/09 0615) BP: (127-149)/(67-76) 131/73 mmHg (04/09 0615) SpO2:  [94 %-99 %] 98 % (04/09 0615) Weight:  [199 lb 8.3 oz (90.5 kg)] 199 lb 8.3 oz (90.5 kg) (04/09 0615)   Intake/Output from previous day: 04/08 0701 - 04/09 0700 In: 1381 [P.O.:60; I.V.:20; IV Piggyback:14; TPN:1287] Out: 800 [Urine:800] Intake/Output this shift:     General appearance: alert, cooperative and no distress Resp: clear to auscultation bilaterally GI: Abdomen is soft and not tender. few BS  Incision: no significant drainage, No change  Lab Results:   Basename 04/01/12 0644 03/31/12 0507  WBC 8.9 8.0  HGB 10.1* 10.2*  HCT 29.8* 29.6*  PLT 188 175   BMET  Basename 04/01/12 0644 03/31/12 0507  NA 130* 130*  K 4.3 4.2  CL 99 101  CO2 24 24  GLUCOSE 116* 126*  BUN 27* 30*  CREATININE 0.88 0.89  CALCIUM 8.3* 8.5   PT/INR No results found for this basename: LABPROT:2,INR:2 in the last 72 hours ABG No results found for this basename: PHART:2,PCO2:2,PO2:2,HCO3:2 in the last 72 hours  MEDS, Scheduled    . bisacodyl  10 mg Rectal BID  . digoxin  0.125 mg Oral Daily  . enoxaparin  40 mg Subcutaneous Q24H  . feeding supplement  1 Container Oral TID BM  . furosemide  20 mg Intravenous Once  . HYDROmorphone PCA 0.3 mg/mL   Intravenous Q4H  . HYDROmorphone PCA 0.3 mg/mL      . HYDROmorphone PCA 0.3 mg/mL      . magic mouthwash  10 mL Oral TID  . metoCLOPramide (REGLAN) injection  10 mg Intravenous Q6H  . metoprolol tartrate  50 mg Oral BID  . moxifloxacin  400 mg Intravenous Q24H  . pantoprazole (PROTONIX) IV  40 mg Intravenous Q12H  . DISCONTD: digoxin  0.125 mg Intravenous Daily  . DISCONTD:  metoprolol  10 mg Intravenous Q4H    Studies/Results: No results found.  Assessment: s/p Procedure(s): GASTROJEJUNOSTOMY HERNIA REPAIR VENTRAL ADULT Gradually progressing  Plan: Advance diet to full liquids. If tolerated may be able to wean/d/c TNA   LOS: 10 days     Currie Paris, MD, [facility], [place] [phone]   04/01/2012 8:08 AM

## 2012-04-01 NOTE — Progress Notes (Addendum)
Patient ID: Kyle Love, male   DOB: 06/03/1941, 71 y.o.   MRN: [id number]  Brief HPI: 71 y/o white man with a history of metastatic colon cancer followed by Dr. Myrle Sheng, who was initially admitted on 03/22/2012 with main concern of persistent sharp abdominal pain about 10 minutes after eating and has not been able to tolerate solids or liquids. He has also reported loosing 10 lbs over the 10 days period prior to admission. CT on the 03/29/2013jindicated that at the junction of the first and second part of the duodenum, there is narrowing of the duodenum with endoluminal nodular density and circumferential narrowing, the appearance compatible with stricture which may be malignant.   PROCEDURES: EGD 03/23/2012 RECOMMENDATIONS:  Await biopsy results surgical consult, oncology consult, will need to bypass obstruction or place feedinj jejunostomy depensing on plans for further ChemoRx, The obstruction is likely caused by enlarged lymph nodes in the area. consider placement of a duodenal stent as a temporary measure  UPPER GI BARIUM Complete obstruction of posterior bulbar duodenum  EXPLORATORY LAP with GASTROJEJUNOSTOMY  03/25/2012 by Dr. Derrell Lolling  CONSULTANTS: GI ONCOLOGY SURGERY CARDIOLOGY  Subjective: No events overnight. Patient denies chest pain, shortness of breath, abdominal pain. Had bowel movement and reports ambulating.  Objective:  Vital signs in last 24 hours:  Filed Vitals:   04/01/12 1200 04/01/12 1318 04/01/12 1637 04/01/12 2000  BP:  122/71    Pulse:  93    Temp:  98.2 F (36.8 C)    TempSrc:  Oral    Resp: 20 20 20 23   Height:      Weight:      SpO2: 97% 97% 98% 98%    Intake/Output from previous day:  Intake/Output Summary (Last 24 hours) at 04/01/12 2241 Last data filed at 04/01/12 2013  Gross per 24 hour  Intake   1561 ml  Output   1300 ml  Net    261 ml    Physical Exam: General: Alert, awake, oriented x3, in no acute distress. HEENT: No  bruits, no goiter. Moist mucous membranes, no scleral icterus, no conjunctival pallor. Heart: Regular rate and rhythm, S1/S2 +, no murmurs, rubs, gallops. Lungs: Clear to auscultation bilaterally. No wheezing, no rhonchi, no rales.  Abdomen: Soft, nontender, nondistended, positive bowel sounds. Extremities: No clubbing or cyanosis, no pitting edema,  positive pedal pulses. Neuro: Grossly nonfocal.  Lab Results:  Lab 04/01/12 0644 03/31/12 0507 03/29/12 0445 03/28/12 0516 03/26/12 0428  WBC 8.9 8.0 8.3 9.4 9.6  HGB 10.1* 10.2* 10.9* 12.1* 11.6*  HCT 29.8* 29.6* 31.8* 35.6* 33.5*  PLT 188 175 200 192 193  MCV 95.8 94.3 96.4 97.3 96.3  MCH 32.5 32.5 33.0 33.1 33.3  MCHC 33.9 34.5 34.3 34.0 34.6  RDW 14.9 15.0 15.3 15.4 15.5  LYMPHSABS -- 0.6* -- -- --  MONOABS -- 0.9 -- -- --  EOSABS -- 0.2 -- -- --  BASOSABS -- 0.0 -- -- --  BANDABS -- -- -- -- --    Lab 04/01/12 4098 03/31/12 0507 03/30/12 0518 03/29/12 0445 03/28/12 0516 03/27/12 0425  NA 130* 130* 131* 132* 132* --  K 4.3 4.2 4.3 3.7 4.5 --  CL 99 101 101 102 99 --  CO2 24 24 26 23 26  --  GLUCOSE 116* 126* 107* 114* 113* --  BUN 27* 30* 26* 23 22 --  CREATININE 0.88 0.89 0.92 0.71 0.80 --  CALCIUM 8.3* 8.5 8.5 8.2* 8.9 --  MG -- 1.8 -- -- --  1.8   No results found for this basename: INR:5,PROTIME:5 in the last 168 hours Cardiac markers:  Lab 03/27/12 0913  CKMB 2.3  TROPONINI <0.30  MYOGLOBIN --   No components found with this basename: POCBNP:3 Recent Results (from the past 240 hour(s))  SURGICAL PCR SCREEN     Status: Normal   Collection Time   03/23/12  8:05 AM      Component Value Range Status Comment   MRSA, PCR NEGATIVE  NEGATIVE  Final    Staphylococcus aureus NEGATIVE  NEGATIVE  Final   SURGICAL PCR SCREEN     Status: Normal   Collection Time   03/25/12 11:32 AM      Component Value Range Status Comment   MRSA, PCR NEGATIVE  NEGATIVE  Final    Staphylococcus aureus NEGATIVE  NEGATIVE  Final     Medications: Scheduled Meds:   . bisacodyl  10 mg Rectal BID  . digoxin  0.125 mg Oral Daily  . enoxaparin  40 mg Subcutaneous Q24H  . feeding supplement  1 Container Oral TID BM  . furosemide  20 mg Intravenous Once  . HYDROmorphone PCA 0.3 mg/mL   Intravenous Q4H  . HYDROmorphone PCA 0.3 mg/mL      . magic mouthwash  10 mL Oral TID  . metoCLOPramide (REGLAN) injection  10 mg Intravenous Q6H  . metoprolol tartrate  50 mg Oral BID  . moxifloxacin  400 mg Intravenous Q24H  . pantoprazole (PROTONIX) IV  40 mg Intravenous Q12H  . DISCONTD: digoxin  0.125 mg Intravenous Daily  . DISCONTD: metoprolol  10 mg Intravenous Q4H   Continuous Infusions:   . sodium chloride    . fat emulsion 240 mL (04/01/12 0700)  . TPN (CLINIMIX) +/- additives    . TPN (CLINIMIX) +/- additives 100 mL/hr at 04/01/12 0700  . DISCONTD: diltiazem (CARDIZEM) infusion 5 mg/hr (04/01/12 0235)   PRN Meds:.diphenhydrAMINE, diphenhydrAMINE, naloxone, ondansetron (ZOFRAN) IV, ondansetron, simethicone, sodium chloride, sodium chloride  Assessment/Plan:  Principal Problem:  *Bowel obstruction  - s/p gastrojejunostomy, post op day #6 - pt continues to use PCA pump and denies pain at the moment  - continue to follow up on surgery recommendations  - NGT is out, pt tolerating clear liquid diet well  Active Problems:  Atrial fibrillation  - HR in 80's this morning, now well controlled on PO medications - cardiology continue to follow - cardiac enzymes x 3 negative   PNA  - completed antibiotic therapy with Avelox for 5 days, will d/c today - pt remains afebrile over 24 hour period   COLON CANCER  - Metastatic colon cancer, a sending colon tumor (T3 N2BM1) with metastatic omental nodules noted at the time of a right colectomy 06/08/2011.  - A restaging PET scan on 02/27/2012 revealed a single new hepatic metastasis and a new right lower lobe pulmonary nodule. - no additional recommendations from oncology  service  - Dr. Truett Perna will follow up in outpt setting  Duodenal stenosis  - s/p gastrojejunostomy, post op day #6 - pt clinically improving - surgery following  ANEMIA-IRON DEFICIENCY  - Hg/Hct remain stable and at pt's baseline   Hyponatremia  - stable  - BMP in AM   Severe protein calorie malnutrition  - given weight loss over 20 lbs and stage 4 colon cancer, progressive failure to thrive  EDUCATION  - test results and diagnostic studies were discussed with patient and pt's family who was present at the bedside (wife)  -  patient and family have verbalized the understanding  - questions were answered at the bedside and contact information was provided for additional questions or concerns    LOS: 10 days   MAGICK-Jordynn Marcella 04/01/2012, 10:41 PM  [facility] Pager: [phone]

## 2012-04-02 DIAGNOSIS — K560 Paralytic ileus: Secondary | ICD-10-CM

## 2012-04-02 DIAGNOSIS — E46 Unspecified protein-calorie malnutrition: Secondary | ICD-10-CM

## 2012-04-02 LAB — GLUCOSE, CAPILLARY
Glucose-Capillary: 114 mg/dL — ABNORMAL HIGH (ref 70–99)
Glucose-Capillary: 106 mg/dL — ABNORMAL HIGH (ref 70–99)

## 2012-04-02 LAB — BASIC METABOLIC PANEL
CO2: 23 mEq/L (ref 19–32)
Calcium: 8.5 mg/dL (ref 8.4–10.5)
Creatinine, Ser: 0.87 mg/dL (ref 0.50–1.35)
GFR calc non Af Amer: 84 mL/min — ABNORMAL LOW (ref >90)
Sodium: 128 mEq/L — ABNORMAL LOW (ref 135–145)

## 2012-04-02 MED ORDER — OXYCODONE-ACETAMINOPHEN 5-325 MG PO TABS: 1.0000 | ORAL_TABLET | ORAL | Status: DC | PRN

## 2012-04-02 MED ORDER — PANTOPRAZOLE SODIUM 40 MG IV SOLR
40.0000 mg | INTRAVENOUS | Status: DC
  Administered 2012-04-03 – 2012-04-07 (×5): 40 mg via INTRAVENOUS
  Filled 2012-04-02 (×6): qty 40

## 2012-04-02 MED ORDER — TRACE MINERALS CR-CU-MN-SE-ZN 10-1000-500-60 MCG/ML IV SOLN
INTRAVENOUS | Status: AC
  Administered 2012-04-02: 18:00:00 via INTRAVENOUS
  Filled 2012-04-02: qty 2000

## 2012-04-02 MED ORDER — FAT EMULSION 20 % IV EMUL
240.0000 mL | INTRAVENOUS | Status: AC
  Administered 2012-04-02: 240 mL via INTRAVENOUS
  Filled 2012-04-02: qty 250

## 2012-04-02 MED ORDER — HYDROMORPHONE 0.3 MG/ML IV SOLN
INTRAVENOUS | Status: AC
  Filled 2012-04-02: qty 25

## 2012-04-02 MED ORDER — TRACE MINERALS CR-CU-MN-SE-ZN 10-1000-500-60 MCG/ML IV SOLN
INTRAVENOUS | Status: DC
  Filled 2012-04-02: qty 2400

## 2012-04-02 MED ORDER — FUROSEMIDE 10 MG/ML IJ SOLN
20.0000 mg | Freq: Once | INTRAMUSCULAR | Status: DC
  Filled 2012-04-02: qty 2

## 2012-04-02 MED ORDER — METOPROLOL TARTRATE 50 MG PO TABS
75.0000 mg | ORAL_TABLET | Freq: Two times a day (BID) | ORAL | Status: DC
  Administered 2012-04-02 – 2012-04-03 (×4): 75 mg via ORAL
  Filled 2012-04-02 (×6): qty 1

## 2012-04-02 MED ORDER — HYDROCODONE-ACETAMINOPHEN 10-325 MG PO TABS
1.0000 | ORAL_TABLET | ORAL | Status: DC | PRN
  Administered 2012-04-02 – 2012-04-03 (×2): 2 via ORAL
  Filled 2012-04-02 (×2): qty 2

## 2012-04-02 NOTE — Progress Notes (Signed)
[facility]  Interim history: 71 y/o white man with a history of metastatic colon cancer followed by Dr. Myrle Sheng, who was initially admitted on 03/22/2012 with main concern of persistent sharp abdominal pain about 10 minutes after eating and has not been able to tolerate solids or liquids. He has also reported loosing 10 lbs over the 10 days period prior to admission. CT on the 03/29/2013jindicated that at the junction of the first and second part of the duodenum, there is narrowing of the duodenum with endoluminal nodular density and circumferential narrowing, the appearance compatible with stricture which may be malignant. He is s/p surgery on 4/2-   gastrojejunostomy and repair of a ventral incisional hernia.   Subjective: No complaints- appears weak  Objective: Blood pressure 136/77, pulse 96, temperature 97.4 F (36.3 C), temperature source Oral, resp. rate 26, height 5\' 10"  (1.778 m), weight 90.5 kg (199 lb 8.3 oz), SpO2 98.00%. Weight change:   Intake/Output Summary (Last 24 hours) at 04/02/12 1906 Last data filed at 04/02/12 1501  Gross per 24 hour  Intake   1090 ml  Output   1002 ml  Net     88 ml    Physical Exam: General appearance: alert, cooperative and fatigued Lungs: clear to auscultation bilaterally Heart: iregular rate and rhythm, S1, S2 normal, no murmur, click, rub or gallop Abdomen: abd distended, tender, bs present Extremities: extremities normal, atraumatic, no cyanosis or edema  Lab Results:  Basename 04/02/12 1005 04/01/12 0644 03/31/12 0507  NA 128* 130* --  K 4.1 4.3 --  CL 96 99 --  CO2 23 24 --  GLUCOSE 117* 116* --  BUN 25* 27* --  CREATININE 0.87 0.88 --  CALCIUM 8.5 8.3* --  MG -- -- 1.8  PHOS -- -- 3.4    Basename 03/31/12 0507  AST 62*  ALT 58*  ALKPHOS 161*  BILITOT 0.5  PROT 6.1  ALBUMIN 1.9*   No results found for this basename: LIPASE:2,AMYLASE:2 in the last 72 hours  Basename 04/01/12 0644 03/31/12 0507  WBC 8.9 8.0    NEUTROABS -- 6.2  HGB 10.1* 10.2*  HCT 29.8* 29.6*  MCV 95.8 94.3  PLT 188 175   No results found for this basename: CKTOTAL:3,CKMB:3,CKMBINDEX:3,TROPONINI:3 in the last 72 hours No components found with this basename: POCBNP:3 No results found for this basename: DDIMER:2 in the last 72 hours No results found for this basename: HGBA1C:2 in the last 72 hours  Basename 03/31/12 0507  CHOL 88  HDL --  LDLCALC --  TRIG 58  CHOLHDL --  LDLDIRECT --   No results found for this basename: TSH,T4TOTAL,FREET3,T3FREE,THYROIDAB in the last 72 hours No results found for this basename: VITAMINB12:2,FOLATE:2,FERRITIN:2,TIBC:2,IRON:2,RETICCTPCT:2 in the last 72 hours   Medications: Scheduled Meds:   . bisacodyl  10 mg Rectal BID  . digoxin  0.125 mg Oral Daily  . enoxaparin  40 mg Subcutaneous Q24H  . feeding supplement  1 Container Oral TID BM  . furosemide  20 mg Intravenous Once  . HYDROmorphone PCA 0.3 mg/mL   Intravenous Q4H  . HYDROmorphone PCA 0.3 mg/mL      . magic mouthwash  10 mL Oral TID  . metoCLOPramide (REGLAN) injection  10 mg Intravenous Q6H  . metoprolol tartrate  75 mg Oral BID  . pantoprazole (PROTONIX) IV  40 mg Intravenous Q12H  . DISCONTD: metoprolol tartrate  50 mg Oral BID  . DISCONTD: moxifloxacin  400 mg Intravenous Q24H   Continuous Infusions:   .  sodium chloride    . fat emulsion 240 mL (04/02/12 1740)  . TPN (CLINIMIX) +/- additives 50 mL/hr at 04/02/12 1742  . TPN (CLINIMIX) +/- additives 80 mL/hr at 04/02/12 1032  . DISCONTD: TPN (CLINIMIX) +/- additives     PRN Meds:.diphenhydrAMINE, diphenhydrAMINE, naloxone, ondansetron (ZOFRAN) IV, ondansetron, oxyCODONE-acetaminophen, simethicone, sodium chloride, sodium chloride  Assessment/Plan: Stage IV colon cancer, malignant obstruction of post bulbar duodenum, ventral hernia s/p Exploratory laparotomy, retrocolic gastrojejunostomy, repair ventral incisional hernia 03/25/12 Dr. Derrell Lolling POD# 7  - surgery  attempting to take off TPN and PCA pump - Dr. Truett Perna will follow up in outpt setting  Atrial fibrillation  -now well controlled on PO medications   PNA  - completed antibiotic therapy with Avelox for 5 days  ANEMIA-IRON DEFICIENCY  - Hg/Hct remain stable and at pt's baseline   Hyponatremia Cardiology ordered a doe of lasix for fluid overload Recheck tomorrow   LOS: 11 days   [facility] [phone] 04/02/2012, 7:06 PM

## 2012-04-02 NOTE — Progress Notes (Signed)
SUBJECTIVE:  Patient denies CP or SOB  OBJECTIVE:   Vitals:   Filed Vitals:   04/01/12 2322 04/02/12 0242 04/02/12 0400 04/02/12 0510  BP:  165/70  152/70  Pulse:  94  99  Temp:  98.3 F (36.8 C)  97.6 F (36.4 C)  TempSrc:  Oral  Oral  Resp: 22 22 22    Height:      Weight:      SpO2: 24% 95% 97% 98%   I&O's:    Intake/Output Summary (Last 24 hours) at 04/02/12 0619 Last data filed at 04/02/12 0548  Gross per 24 hour  Intake   2179 ml  Output   1200 ml  Net    979 ml   TELEMETRY: Reviewed telemetry pt in atrial fibrillation; High normal rate     PHYSICAL EXAM General: Well developed, well nourished, in no acute distress Lungs:   Diminished BS bases Heart:   Irregularly irregular  Abdomen: Bowel sounds are positive, mildly distended; s/p abdominal surgery Extremities:  1+ edema.   Neuro: Alert and oriented X 3.   LABS: Basic Metabolic Panel:  Basename 04/01/12 0644 03/31/12 0507  NA 130* 130*  K 4.3 4.2  CL 99 101  CO2 24 24  GLUCOSE 116* 126*  BUN 27* 30*  CREATININE 0.88 0.89  CALCIUM 8.3* 8.5  MG -- 1.8  PHOS -- 3.4   CBC:  Basename 04/01/12 0644 03/31/12 0507  WBC 8.9 8.0  NEUTROABS -- 6.2  HGB 10.1* 10.2*  HCT 29.8* 29.6*  MCV 95.8 94.3  PLT 188 175   Coag Panel:   Lab Results  Component Value Date   INR 1.05 05/31/2011   INR 1.09 05/21/2011   INR 2.08* 05/11/2011         ASSESSMENT: 1. Atrial flutter/fib with RVR - HR upper normal; change lopressor to 75 mg po BID. Continue Dig. No coumadin given CA and ongoing need for chemotherapy. 2. Metastatic colon cancer status post gastrojejunostomy for treatment of bowel obstruction  3. Aortic valve disease status post tissue aVR with normal prosthetic valve function  4. Cardiomyopathy with left ventricular ejection fraction 40-45% by echocardiogram in 2012; remains mildly volume overloaded; lasix 20 mg IV x 1; BMET in AM.  Olga Millers, MD  04/02/2012  6:19 AM

## 2012-04-02 NOTE — Progress Notes (Signed)
8 Days Post-Op  Subjective: Sitting up, he has walked some but it looks like that wears him out  +BM yesterday, NG out and tolerating full liquid diet, he can't remember what he had for supper.  Objective: Vital signs in last 24 hours: Temp:  [97.6 F (36.4 C)-98.3 F (36.8 C)] 97.6 F (36.4 C) (04/10 0510) Pulse Rate:  [67-109] 99  (04/10 0510) Resp:  [20-30] 30  (04/10 0819) BP: (122-165)/(68-71) 152/70 mmHg (04/10 0510) SpO2:  [24 %-99 %] 92 % (04/10 0819) Last BM Date: 04/01/12 Afebrile, VSS, No labs today Intake/Output from previous day: 04/09 0701 - 04/10 0700 In: 1330 [P.O.:240; IV Piggyback:10; TPN:1080] Out: 1200 [Urine:1200] Intake/Output this shift: Total I/O In: -  Out: 300 [Urine:300]  General appearance: alert, appears stated age, no distress and winded sitting up on side of bed. Chest:  Clear Abd: distended, +BS, +BM,  Incision looks good. Lab Results:   [facility] 04/01/12 0644 03/31/12 0507  WBC 8.9 8.0  HGB 10.1* 10.2*  HCT 29.8* 29.6*  PLT 188 175    BMET  Basename 04/01/12 0644 03/31/12 0507  NA 130* 130*  K 4.3 4.2  CL 99 101  CO2 24 24  GLUCOSE 116* 126*  BUN 27* 30*  CREATININE 0.88 0.89  CALCIUM 8.3* 8.5   PT/INR No results found for this basename: LABPROT:2,INR:2 in the last 72 hours   Lab 03/31/12 0507 03/27/12 0425  AST 62* 36  ALT 58* 32  ALKPHOS 161* 156*  BILITOT 0.5 0.3  PROT 6.1 6.0  ALBUMIN 1.9* 2.0*     Lipase  No results found for this basename: lipase     Studies/Results: No results found.  Medications:    . bisacodyl  10 mg Rectal BID  . digoxin  0.125 mg Oral Daily  . enoxaparin  40 mg Subcutaneous Q24H  . feeding supplement  1 Container Oral TID BM  . furosemide  20 mg Intravenous Once  . HYDROmorphone PCA 0.3 mg/mL   Intravenous Q4H  . magic mouthwash  10 mL Oral TID  . metoCLOPramide (REGLAN) injection  10 mg Intravenous Q6H  . metoprolol tartrate  75 mg Oral BID  . pantoprazole (PROTONIX) IV  40  mg Intravenous Q12H  . DISCONTD: metoprolol tartrate  50 mg Oral BID  . DISCONTD: moxifloxacin  400 mg Intravenous Q24H    Assessment/Plan Stage IV colon cancer, malignant obstruction of post bulbar duodenum, ventral hernia, s/p Exploratory laparotomy, retrocolic gastrojejunostomy, repair ventral incisional hernia 03/25/12 Dr. Derrell Lolling POD# 7  COPD with history of 50+ years of tobacco use  Atrial flutter/fib with RVR - HR controlled; change IV meds to po. No coumadin given CA and ongoing need for chemotherapy  Aortic valve disease status post tissue aVR with normal prosthetic valve function  Cardiomyopathy with left ventricular ejection fraction 40-45%   Plan:  Advance diet, wean TNA, add PO pain meds.  Try to get him off PCA tomorrow.       LOS: 11 days    Kyle Love 04/02/2012

## 2012-04-02 NOTE — Progress Notes (Signed)
Patient complaining of nausea. Unable to eat lunch.  One episode of emesis at this time, the color of orange. Had orange sherbet for breakfast.  Giving patient Zofran.

## 2012-04-02 NOTE — Evaluation (Signed)
Physical Therapy Evaluation Patient Details Name: Kyle Love MRN: [id number] DOB: [birth year] Today's Date: 04/02/2012  Problem List:  Patient Active Problem List  Diagnoses  . COLON CANCER  . CAD, ARTERY BYPASS GRAFT  . Atrial fibrillation  . COPD  . DIVERTICULAR BLEEDING, HX OF  . RENAL CALCULUS, HX OF  . BEE STING ALLERGY  . AORTIC VALVE REPLACEMENT, HX OF  . SHORTNESS OF BREATH  . ANEMIA-IRON DEFICIENCY  . Colitis  . Constipation  . PNA (pneumonia)  . UTI (lower urinary tract infection)  . Hypokalemia  . Hyponatremia  . Leukocytosis  . Encounter for fitting of portacath  . Bowel obstruction  . Transaminitis  . Duodenal stenosis    Past Medical History:  Past Medical History  Diagnosis Date  . History of ETOH abuse     quit 25 yrs ago, heavy etoh 5 yrs  . Colitis   . History of GI diverticular bleed 3/11  . Rectus sheath hematoma 3/10  . Status post Maze operation for atrial fibrillation   . Aortic aneurysm   . 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 S/P colonoscopy 05/03/10    mod diverticulosis sigmoid colon, internal hemorrhoids, suspected diverticular bleed  . Diverticulosis 07/23/2008    colonoscopy by Dr Jena Gauss, hyperplastic polyp  . IDA (iron deficiency anemia)     work-up including small bowel capsule study benign  . CHF (congestive heart failure)     diastolic  . COPD (chronic obstructive pulmonary disease)   . CAD (coronary artery disease)     s/p CABG  . S/P aortic valve replacement   . Cancer right colon T3N2M1  . Weight decrease     20-30 lb in month  . Pneumonia   . History of colon polyps   . Shortness of breath   . Arthritis     bilateral neck and shoulder pain  . Atrial fibrillation    Past Surgical History:  Past Surgical History  Procedure Date  . Maze   . Aortic valve replacement 04/16/2007  . Hemorrhoid surgery 1997  . Esophagogastroduodenoscopy  05/14/11    Dr Elnoria Howard reportedly normal  . Tonsillectomy   . Colonoscopy 05/17/11, 07/23/08, 07/05/06, 10/10/05    at [facility]: diverticlosis,internal hemorrhiods  . Portacath placement 07/16/2011  . Colon surgery 06/09/11    LAP. RIGHT COLECTOMY  . Cholecystectomy 06/09/11  . Cardiac catheterization 04/03/2007  . Esophagogastroduodenoscopy 03/23/2012    Procedure: ESOPHAGOGASTRODUODENOSCOPY (EGD);  Surgeon: Hart Carwin, MD;  Location: [facility];  Service: Endoscopy;  Laterality: N/A;    PT Assessment/Plan/Recommendation PT Assessment Clinical Impression Statement: Pt admitted with diagnosis of duodenal stenosis. Pt s/p exp lap and ventral hernia repair on 03/25/12. During PT eval, pt demonstrating general weakness and decreased activity tolerance. Pt will benefit from skilled PT in acute setting to maximize independence and safety in preparation for d/c home with family assistl PT Recommendation/Assessment: Patient will need skilled PT in the acute care venue PT Problem List: Decreased strength;Decreased activity tolerance;Decreased mobility;Decreased knowledge of use of DME PT Therapy Diagnosis : Difficulty walking;Generalized weakness PT Plan PT Frequency: Min 3X/week PT Treatment/Interventions: DME instruction;Gait training;Functional mobility training;Therapeutic activities;Therapeutic exercise;Patient/family education PT Recommendation Recommendations for Other Services: OT consult Follow Up Recommendations: Home health PT Equipment Recommended: None recommended by PT PT Goals  Acute Rehab PT Goals PT Goal Formulation: With patient Time For Goal Achievement: 2 weeks Pt will go Supine/Side  to Sit: with modified independence PT Goal: Supine/Side to Sit - Progress: Goal set today Pt will go Sit to Supine/Side: with modified independence PT Goal: Sit to Supine/Side - Progress: Goal set today Pt will go Sit to Stand: with modified independence PT Goal: Sit to Stand - Progress: Goal set  today Pt will Ambulate: >150 feet;with modified independence;with least restrictive assistive device PT Goal: Ambulate - Progress: Goal set today Pt will Go Up / Down Stairs: 3-5 stairs;with rail(s) (3 steps) PT Goal: Up/Down Stairs - Progress: Goal set today  PT Evaluation Precautions/Restrictions    Prior Functioning  Home Living Lives With: Spouse Receives Help From: Family Type of Home: House Home Layout: Multi-level;Able to live on main level with bedroom/bathroom Home Access: Stairs to enter Entrance Stairs-Rails: Right Entrance Stairs-Number of Steps: 3 Home Adaptive Equipment: Walker - rolling;Straight cane;Crutches;Bedside commode/3-in-1 Prior Function Level of Independence: Independent with basic ADLs;Independent with transfers;Independent with gait;Independent with homemaking with ambulation Cognition Cognition Arousal/Alertness: Awake/alert Overall Cognitive Status: Appears within functional limits for tasks assessed Orientation Level: Oriented X4 Sensation/Coordination Sensation Light Touch: Appears Intact Coordination Gross Motor Movements are Fluid and Coordinated: Yes Extremity Assessment RLE Assessment RLE Assessment: Within Functional Limits LLE Assessment LLE Assessment: Within Functional Limits Mobility (including Balance) Bed Mobility Bed Mobility: Yes Supine to Sit: HOB elevated (Comment degrees);With rails;4: Min assist Supine to Sit Details (indicate cue type and reason): Assist for safety, lines.  Transfers Transfers: Yes Sit to Stand: 4: Min assist;From bed;With upper extremity assist Sit to Stand Details (indicate cue type and reason): assist to steady. VCs safety. Pt somewhat impulsive. Stand to Sit: 4: Min assist;To chair/3-in-1;With armrests;With upper extremity assist Stand to Sit Details: assist to control descent. VCs safety.  Ambulation/Gait Ambulation/Gait: Yes Ambulation/Gait Assistance: 4: Min assist Ambulation/Gait Assistance  Details (indicate cue type and reason): pushing iv pole. assist to steady. noted dyspnea 2-3/4 with ambulation on RA but sats >/= 90% Ambulation Distance (Feet): 175 Feet Assistive device: None  Posture/Postural Control Posture/Postural Control: No significant limitations Exercise    End of Session PT - End of Session Equipment Utilized During Treatment: Gait belt Activity Tolerance: Patient tolerated treatment well Patient left: in chair;with call bell in reach;with family/visitor present General Behavior During Session: [facility] for tasks performed Cognition: [facility] for tasks performed  Rebeca Alert [facility] 04/02/2012, 1:24 PM [phone]

## 2012-04-02 NOTE — Progress Notes (Addendum)
PARENTERAL NUTRITION CONSULT NOTE - FOLLOW UP  Pharmacy Consult for TNA Indication: Duodenal Outlet Obstruction  Allergies  Allergen Reactions  . Diltiazem Hcl     REACTION: Severe lower extremity edema on generic cardizem  . Penicillins     Pt can tolerate cephalosporins/Pt tol Zosyn 11/01/11  . Plasma Human     Patient Measurements: Height: 5\' 10"  (177.8 cm) Weight: 199 lb 8.3 oz (90.5 kg) IBW/kg (Calculated) : 73  Adjusted Body Weight: 75 kg Recent weight loss: ~13 lb over 2 weeks  Vital Signs: Temp: 97.6 F (36.4 C) (04/10 0510) Temp src: Oral (04/10 0510) BP: 152/70 mmHg (04/10 0510) Pulse Rate: 99  (04/10 0510)  Intake/Output from previous day: 04/09 0701 - 04/10 0700 In: 1330 [P.O.:240; IV Piggyback:10; TPN:1080] Out: 1200 [Urine:1200]  Labs:  [facility] 04/01/12 0644 03/31/12 0507  WBC 8.9 8.0  HGB 10.1* 10.2*  HCT 29.8* 29.6*  PLT 188 175  APTT -- --  INR -- --    Basename 04/01/12 0644 03/31/12 0507  NA 130* 130*  K 4.3 4.2  CL 99 101  CO2 24 24  GLUCOSE 116* 126*  BUN 27* 30*  CREATININE 0.88 0.89  LABCREA -- --  CREAT24HRUR -- --  CALCIUM 8.3* 8.5  MG -- 1.8  PHOS -- 3.4  PROT -- 6.1  ALBUMIN -- 1.9*  AST -- 62*  ALT -- 58*  ALKPHOS -- 161*  BILITOT -- 0.5  BILIDIR -- --  IBILI -- --  PREALBUMIN -- 7.9*  TRIG -- 58  CHOLHDL -- --  CHOL -- 88   Estimated Creatinine Clearance: 85.9 ml/min (by C-G formula based on Cr of 0.88).   Insulin Requirements in the past 24 hours:  No sliding scale ordered currently, CBGs < 150s  Nutritional Goals:   Re-estimated recs per RD 4/4: Kcal: 2250-2500, Protein: 120-145g, Fluid: 2.2-2.5L  Clinimix E5/20 at goal rate of 100 ml/hr + Lipids 20% at 78ml/hr (MWF only) = 120 g protein (1.4 g/kg/day), average of 2318 kcal/day  (2592 kcal MWF, 2112 kcal TTSS)  Current Nutrition:  Clinimix E5/20 at 100 ml/hr Lipid emulsion 20% at 54ml/hr MWF only FLD, advanced on 4/9am  IVF: NS at  [facility]  Assessment: 75 YOM with metastatic colon cancer, CT scan 3/8 with dilated duodenal bulb, possible malignant obstruction, liver masses.  EGD 3/31, with duodenal outlet obstruction, attempted made for dilatation with balloon but lumen re-closed after balloon deflated. UGI 4/1, with complete obstruction in the post bulbar duodenum.  Patient is s/p exp lap and gastrojejunostomy 4/2.  TNA goal rate adjusted and advanced to goal 4/5. Lipid emulsion currently available for daily dosing, but due to high protein needs will not give daily to prevent excess kcal. Diet advanced to FLD 4/9am. Per surgery note from 4/9, if patient tolerated FLD, may be able to wean and D/C TNA soon.  Lytes: No labs today LFTs: LFTs elevated prior to starting TNA, slight bump in AST/ALT on 4/8, alk Phos --> monitor CBGs: In goal range <150 PO:  recorded 4/9 IVF: kvo'd PPx: Reglan IV 4x daily, IV Protonix BID  Plan:  1) Continue Clinimix E 5/20 at 100 ml/hr (goal rate) 2) Continue Lipid emulsion 20% at 28ml/hr only MWF (to avoid over-feeding) 3) MVI and trace elements MWF only due to ongoing shortage 4) Continue CBGs q8h, no SSI ordered/required at this time 5) TNA lab panels on Mondays & Thursdays. - watch LFTs 6) Await tolerance of diet, hopefully begin weaning TNA  soon.  Darrol Angel, PharmD Pager: [phone] 04/02/2012 8:09 AM   ADDENDUM: Sherron Monday with Zola Button, PA. Would like to start weaning TNA today, but not completely off yet. Wants another 24 hours to assess diet tolerance. Will plan to continue Clinimix E 5/20, but cut the rate down to 63ml/hr now, then 34ml/hr tonight at 18:00. Continue with IV lipids as planned tonight. Will await PA/MD assessment in am to determine further weaning plans (possibly with D/C of TNA at 18:00 tomorrow night, if tolerates diet advancement today.)  Darrol Angel, PharmD Pager: [phone] 04/02/2012 10:08 AM

## 2012-04-02 NOTE — Progress Notes (Signed)
Vomited a small amount. Abd soft but distended. May have some gastric emptying issues. Will leave on liquids

## 2012-04-02 NOTE — Progress Notes (Signed)
Patient complaining of mild bloating.  Had one small BM in the am and a large BM in the afternoon.

## 2012-04-03 ENCOUNTER — Inpatient Hospital Stay (HOSPITAL_COMMUNITY): Payer: Medicare Other

## 2012-04-03 ENCOUNTER — Encounter (HOSPITAL_COMMUNITY): Payer: Self-pay | Admitting: General Surgery

## 2012-04-03 LAB — COMPREHENSIVE METABOLIC PANEL
AST: 171 U/L — ABNORMAL HIGH (ref 0–37)
Albumin: 1.8 g/dL — ABNORMAL LOW (ref 3.5–5.2)
BUN: 23 mg/dL (ref 6–23)
Calcium: 8.1 mg/dL — ABNORMAL LOW (ref 8.4–10.5)
Chloride: 98 mEq/L (ref 96–112)
Creatinine, Ser: 0.84 mg/dL (ref 0.50–1.35)
GFR calc non Af Amer: 85 mL/min — ABNORMAL LOW (ref >90)
Total Bilirubin: 0.5 mg/dL (ref 0.3–1.2)

## 2012-04-03 LAB — GLUCOSE, CAPILLARY: Glucose-Capillary: 109 mg/dL — ABNORMAL HIGH (ref 70–99)

## 2012-04-03 LAB — PHOSPHORUS: Phosphorus: 3.3 mg/dL (ref 2.3–4.6)

## 2012-04-03 LAB — MAGNESIUM: Magnesium: 1.8 mg/dL (ref 1.5–2.5)

## 2012-04-03 MED ORDER — SIMETHICONE 80 MG PO CHEW
80.0000 mg | CHEWABLE_TABLET | Freq: Four times a day (QID) | ORAL | Status: DC
  Administered 2012-04-03 – 2012-04-08 (×19): 80 mg via ORAL
  Filled 2012-04-03 (×23): qty 1

## 2012-04-03 MED ORDER — CLINIMIX E/DEXTROSE (5/20) 5 % IV SOLN
INTRAVENOUS | Status: AC
  Administered 2012-04-03: 18:00:00 via INTRAVENOUS
  Filled 2012-04-03: qty 2000

## 2012-04-03 NOTE — Progress Notes (Signed)
SUBJECTIVE:  Patient denies CP or SOB; tolerating liquids  OBJECTIVE:   Vitals:   Filed Vitals:   04/02/12 2329 04/03/12 0143 04/03/12 0533 04/03/12 0657  BP:  147/73 119/65   Pulse:  85 100   Temp:  98.4 F (36.9 C) 97.5 F (36.4 C)   TempSrc:  Oral Oral   Resp: 18 26 20 21   Height:      Weight:   208 lb 1.8 oz (94.4 kg)   SpO2: 98% 98% 98% 99%   I&O's:    Intake/Output Summary (Last 24 hours) at 04/03/12 0707 Last data filed at 04/03/12 0700  Gross per 24 hour  Intake 2207.67 ml  Output    552 ml  Net 1655.67 ml   TELEMETRY: Reviewed telemetry pt in atrial fibrillation; High normal rate     PHYSICAL EXAM General: Well developed, well nourished, in no acute distress Lungs:   Mildly diminished BS bases Heart:   Irregularly irregular  Abdomen: Bowel sounds are positive, mildly distended; s/p abdominal surgery Extremities:  trace edema.   Neuro: Alert and oriented X 3.   LABS: Basic Metabolic Panel:  Basename 04/03/12 0455 04/02/12 1005  NA 126* 128*  K 4.1 4.1  CL 98 96  CO2 22 23  GLUCOSE 123* 117*  BUN 23 25*  CREATININE 0.84 0.87  CALCIUM 8.1* 8.5  MG 1.8 --  PHOS 3.3 --   CBC:  Basename 04/01/12 0644  WBC 8.9  NEUTROABS --  HGB 10.1*  HCT 29.8*  MCV 95.8  PLT 188   Coag Panel:   Lab Results  Component Value Date   INR 1.05 05/31/2011   INR 1.09 05/21/2011   INR 2.08* 05/11/2011         ASSESSMENT: 1. Atrial flutter/fib with RVR - HR upper normal; continue lopressor 75 mg po BID. Continue Dig. No coumadin given CA and ongoing need for chemotherapy. 2. Metastatic colon cancer status post gastrojejunostomy for treatment of bowel obstruction  3. Aortic valve disease status post tissue aVR with normal prosthetic valve function  4. Cardiomyopathy with left ventricular ejection fraction 40-45% by echocardiogram in 2012; volume status improved; Na decreased. Follow.   Olga Millers, MD  04/03/2012  7:07 AM

## 2012-04-03 NOTE — Progress Notes (Signed)
9 Days Post-Op  Subjective: He's not eating, stools sound like liquid when he has had them.  Restless at night sleeps with any pain med.  Abdomen is distended.  Objective: Vital signs in last 24 hours: Temp:  [97.4 F (36.3 C)-98.4 F (36.9 C)] 97.5 F (36.4 C) (04/11 0533) Pulse Rate:  [85-108] 108  (04/11 0946) Resp:  [18-97] 21  (04/11 0657) BP: (119-147)/(65-77) 133/73 mmHg (04/11 0946) SpO2:  [97 %-99 %] 99 % (04/11 0657) FiO2 (%):  [2 %] 2 % (04/11 0657) Weight:  [94.4 kg (208 lb 1.8 oz)] 94.4 kg (208 lb 1.8 oz) (04/11 0533) Last BM Date: 04/02/12 2 stools yesterday, Intake/Output from previous day: 04/10 0701 - 04/11 0700 In: 2207.7 [IV Piggyback:16; TPN:2191.7] Out: 852 [Urine:850; Stool:2] Intake/Output this shift: Total I/O In: -  Out: 200 [Urine:200]  General appearance: alert, cooperative and tired, and uncomfortable. GI: abdomen is distended, BS hyperactive, he's not really tender.  Lab Results:   [facility] 04/01/12 0644  WBC 8.9  HGB 10.1*  HCT 29.8*  PLT 188    BMET  Basename 04/03/12 0455 04/02/12 1005  NA 126* 128*  K 4.1 4.1  CL 98 96  CO2 22 23  GLUCOSE 123* 117*  BUN 23 25*  CREATININE 0.84 0.87  CALCIUM 8.1* 8.5   PT/INR No results found for this basename: LABPROT:2,INR:2 in the last 72 hours   Lab 04/03/12 0455 03/31/12 0507  AST 171* 62*  ALT 149* 58*  ALKPHOS 283* 161*  BILITOT 0.5 0.5  PROT 6.1 6.1  ALBUMIN 1.8* 1.9*     Lipase  No results found for this basename: lipase     Studies/Results: No results found.  Medications:    . bisacodyl  10 mg Rectal BID  . digoxin  0.125 mg Oral Daily  . enoxaparin  40 mg Subcutaneous Q24H  . feeding supplement  1 Container Oral TID BM  . furosemide  20 mg Intravenous Once  . HYDROmorphone PCA 0.3 mg/mL   Intravenous Q4H  . HYDROmorphone PCA 0.3 mg/mL      . magic mouthwash  10 mL Oral TID  . metoprolol tartrate  75 mg Oral BID  . pantoprazole (PROTONIX) IV  40 mg Intravenous  Q24H  . DISCONTD: metoCLOPramide (REGLAN) injection  10 mg Intravenous Q6H  . DISCONTD: pantoprazole (PROTONIX) IV  40 mg Intravenous Q12H    Assessment/Plan Stage IV colon cancer, malignant obstruction of post bulbar duodenum, ventral hernia, s/p Exploratory laparotomy, retrocolic gastrojejunostomy, repair ventral incisional hernia 03/25/12 Dr. Derrell Lolling POD# 7  COPD with history of 50+ years of tobacco use  Atrial flutter/fib with RVR - HR controlled; change IV meds to po. No coumadin given CA and ongoing need for chemotherapy  Aortic valve disease status post tissue aVR with normal prosthetic valve function  Cardiomyopathy with left ventricular ejection fraction 40-45%   Plan: 2 view abd., continue TNA, go back to clears till we see film.  He may need an UGI to see if food is going thru anastomosis.        LOS: 12 days    Estephany Perot 04/03/2012

## 2012-04-03 NOTE — Progress Notes (Signed)
Patient still feels nausea and throwing up small volumes of clear material. Not much pain. He is comfortable with soft, but some distended abdomen. Labs noted Abd xray pending. I suspect there is an element of delayed gatriric emptying. Will re-assess plans after xrays.  Kyle Love J 12:43 PM

## 2012-04-03 NOTE — Progress Notes (Signed)
Patient ID: JAMIEON LANNEN, male   DOB: 02-07-1941, 71 y.o.   MRN: [id number]  Subjective: No events overnight. Patient denies chest pain, shortness of breath. He does reports nausea and intermittent epigastric pain.   Objective:  Vital signs in last 24 hours:  Filed Vitals:   06-Apr-2012 0533 April 06, 2012 0657 April 06, 2012 0946 2012/04/06 1255  BP: 119/65  133/73 122/66  Pulse: 100  108 97  Temp: 97.5 F (36.4 C)   98.6 F (37 C)  TempSrc: Oral   Oral  Resp: 20 21  20   Height:      Weight: 94.4 kg (208 lb 1.8 oz)     SpO2: 98% 99%  93%    Intake/Output from previous day:   Intake/Output Summary (Last 24 hours) at 04-06-2012 1603 Last data filed at April 06, 2012 1100  Gross per 24 hour  Intake 2193.67 ml  Output    750 ml  Net 1443.67 ml    Physical Exam: General: Alert, awake, oriented x3, in no acute distress. HEENT: No bruits, no goiter. Moist mucous membranes, no scleral icterus, no conjunctival pallor. Heart: Regular rate and rhythm, S1/S2 +, no murmurs, rubs, gallops. Lungs: Clear to auscultation bilaterally with bibasilar crackles. No wheezing, no rhonchi, no rales.  Abdomen: Soft, tender in the epigastric area, mildly distended, positive bowel sounds. Extremities: No clubbing or cyanosis, no pitting edema,  positive pedal pulses. Neuro: Grossly nonfocal.  Lab Results:   Lab 04/01/12 0644 03/31/12 0507 03/29/12 0445 03/28/12 0516  WBC 8.9 8.0 8.3 9.4  HGB 10.1* 10.2* 10.9* 12.1*  HCT 29.8* 29.6* 31.8* 35.6*  PLT 188 175 200 192  MCV 95.8 94.3 96.4 97.3  MCH 32.5 32.5 33.0 33.1  MCHC 33.9 34.5 34.3 34.0  RDW 14.9 15.0 15.3 15.4  LYMPHSABS -- 0.6* -- --  MONOABS -- 0.9 -- --  EOSABS -- 0.2 -- --  BASOSABS -- 0.0 -- --  BANDABS -- -- -- --    Lab 06-Apr-2012 0455 04/02/12 1005 04/01/12 0644 03/31/12 0507 03/30/12 0518  NA 126* 128* 130* 130* 131*  K 4.1 4.1 4.3 4.2 4.3  CL 98 96 99 101 101  CO2 22 23 24 24 26   GLUCOSE 123* 117* 116* 126* 107*  BUN 23 25* 27* 30* 26*    CREATININE 0.84 0.87 0.88 0.89 0.92  CALCIUM 8.1* 8.5 8.3* 8.5 8.5  MG 1.8 -- -- 1.8 --   No results found for this basename: INR:5,PROTIME:5 in the last 168 hours Cardiac markers: No results found for this basename: CK:3,CKMB:3,TROPONINI:3,MYOGLOBIN:3 in the last 168 hours No components found with this basename: POCBNP:3 Recent Results (from the past 240 hour(s))  SURGICAL PCR SCREEN     Status: Normal   Collection Time   03/25/12 11:32 AM      Component Value Range Status Comment   MRSA, PCR NEGATIVE  NEGATIVE  Final    Staphylococcus aureus NEGATIVE  NEGATIVE  Final     Studies/Results: Dg Abd 2 Views  2012-04-06  *RADIOLOGY REPORT*  Clinical Data: Small bowel obstruction.  ABDOMEN - 2 VIEW  Comparison: 03/24/2012  Findings: There is gaseous distention of the transverse colon. Oral contrast material is seen within within decompressed descending colon.  No evidence of small bowel dilatation.  No free air.  No organomegaly.  IMPRESSION: Gaseous distention of the transverse colon.  No evidence of small bowel obstruction.  Original Report Authenticated By: Cyndie Chime, M.D.    Medications: Scheduled Meds:   . bisacodyl  10 mg Rectal BID  . digoxin  0.125 mg Oral Daily  . enoxaparin  40 mg Subcutaneous Q24H  . feeding supplement  1 Container Oral TID BM  . furosemide  20 mg Intravenous Once  . HYDROmorphone PCA 0.3 mg/mL   Intravenous Q4H  . HYDROmorphone PCA 0.3 mg/mL      . magic mouthwash  10 mL Oral TID  . metoprolol tartrate  75 mg Oral BID  . pantoprazole (PROTONIX) IV  40 mg Intravenous Q24H  . DISCONTD: metoCLOPramide (REGLAN) injection  10 mg Intravenous Q6H  . DISCONTD: pantoprazole (PROTONIX) IV  40 mg Intravenous Q12H   Continuous Infusions:   . sodium chloride 20 mL/hr at 04/02/12 2328  . fat emulsion 240 mL (04/03/12 0700)  . TPN (CLINIMIX) +/- additives    . TPN (CLINIMIX) +/- additives 50 mL/hr at 04/03/12 0700  . TPN (CLINIMIX) +/- additives 80 mL/hr at  04/02/12 1032   PRN Meds:.diphenhydrAMINE, diphenhydrAMINE, naloxone, ondansetron (ZOFRAN) IV, ondansetron, simethicone, sodium chloride, sodium chloride, DISCONTD: HYDROcodone-acetaminophen, DISCONTD: oxyCODONE-acetaminophen  Assessment/Plan:  Principal Problem:  *Bowel obstruction  - s/p gastrojejunostomy, post op day #8 - pt continues to use PCA pump but still reports intermittent episodes of abdominal pain with nausea - continue to follow up on surgery recommendations  - NGT is out, pt tolerating currentdiet well   Active Problems:  Atrial fibrillation  - HR in 90 - 100's this morning, now well controlled on PO medications  - cardiology continue to follow  - cardiac enzymes x 3 negative   PNA  - completed antibiotic therapy with Avelox for 5 days - pt remains afebrile over 24 hour period   COLON CANCER  - Metastatic colon cancer, a sending colon tumor (T3 N2BM1) with metastatic omental nodules noted at the time of a right colectomy 06/08/2011.  - A restaging PET scan on 02/27/2012 revealed a single new hepatic metastasis and a new right lower lobe pulmonary nodule.  - no additional recommendations from oncology service  - Dr. Truett Perna will follow up in outpt setting   Duodenal stenosis  - s/p gastrojejunostomy, post op day #8 - pt clinically improving but still nausea, ? Gastroparesis - surgery following  - XRAY negative for evidence of obstruction  ANEMIA-IRON DEFICIENCY  - Hg/Hct remain stable and at pt's baseline   Hyponatremia  - stable  - BMP in AM   EDUCATION  - test results and diagnostic studies were discussed with patient and pt's family who was present at the bedside (wife)  - patient and family have verbalized the understanding  - questions were answered at the bedside and contact information was provided for additional questions or concerns    LOS: 12 days   Daeja Helderman 04/03/2012, 4:03 PM  [facility] Pager: [phone]

## 2012-04-03 NOTE — Progress Notes (Signed)
PARENTERAL NUTRITION CONSULT NOTE - FOLLOW UP  Pharmacy Consult for TNA Indication: Duodenal Outlet Obstruction  Allergies  Allergen Reactions  . Diltiazem Hcl     REACTION: Severe lower extremity edema on generic cardizem  . Penicillins     Pt can tolerate cephalosporins/Pt tol Zosyn 11/01/11  . Plasma Human     Patient Measurements: Height: 5\' 10"  (177.8 cm) Weight: 208 lb 1.8 oz (94.4 kg) IBW/kg (Calculated) : 73  Adjusted Body Weight: 75 kg Recent weight loss: ~13 lb over 2 weeks  Vital Signs: Temp: 97.5 F (36.4 C) (04/11 0533) Temp src: Oral (04/11 0533) BP: 133/73 mmHg (04/11 0946) Pulse Rate: 108  (04/11 0946)  Intake/Output from previous day: 04/10 0701 - 04/11 0700 In: 2207.7 [IV Piggyback:16; TPN:2191.7] Out: 852 [Urine:850; Stool:2]  Labs:  [facility] 04/01/12 0644  WBC 8.9  HGB 10.1*  HCT 29.8*  PLT 188  APTT --  INR --    Basename 04/03/12 0455 04/02/12 1005 04/01/12 0644  NA 126* 128* 130*  K 4.1 4.1 4.3  CL 98 96 99  CO2 22 23 24   GLUCOSE 123* 117* 116*  BUN 23 25* 27*  CREATININE 0.84 0.87 0.88  LABCREA -- -- --  CREAT24HRUR -- -- --  CALCIUM 8.1* 8.5 8.3*  MG 1.8 -- --  PHOS 3.3 -- --  PROT 6.1 -- --  ALBUMIN 1.8* -- --  AST 171* -- --  ALT 149* -- --  ALKPHOS 283* -- --  BILITOT 0.5 -- --  BILIDIR -- -- --  IBILI -- -- --  PREALBUMIN -- -- --  TRIG -- -- --  CHOLHDL -- -- --  CHOL -- -- --   Estimated Creatinine Clearance: 91.7 ml/min (by C-G formula based on Cr of 0.84).   Insulin Requirements in the past 24 hours:  No sliding scale ordered currently, CBGs < 150s  Nutritional Goals:   Re-estimated recs per RD 4/4: Kcal: 2250-2500, Protein: 120-145g, Fluid: 2.2-2.5L  Clinimix E5/20 at goal rate of 100 ml/hr + Lipids 20% at 14ml/hr (MWF only) = 120 g protein (1.4 g/kg/day), average of 2318 kcal/day  (2592 kcal MWF, 2112 kcal TTSS)  Current Nutrition:  Clinimix E5/20 at 50 ml/hr Lipid emulsion 20% at 53ml/hr MWF  only Low fiber diet (adv 4/10)  IVF: NS at [facility]  Assessment: 70 YOM with metastatic colon cancer, CT scan 3/8 with dilated duodenal bulb, possible malignant obstruction, liver masses.  EGD 3/31, with duodenal outlet obstruction, attempt made for dilatation with balloon but lumen re-closed after balloon deflated. UGI 4/1, with complete obstruction in the post bulbar duodenum.  Patient is s/p exp lap and gastrojejunostomy 4/2.  TNA goal rate adjusted and advanced to goal 4/5, TNA taper started 4/10. Lipid emulsion currently available for daily dosing, but due to high protein needs will not give daily to prevent excess kcal. Diet advanced to FLD 4/9am, then to low fiber on 4/10. TNA taper began 4/10 per PA request. Awaiting MD assessment today to see if can d/c TNA completely tonight. Per RN, patient is not eating anything.  Lytes: Na low (unable to change TNA content), corrected Ca = 9.9 LFTs: LFTs elevated prior to starting TNA, slight bump on 4/8, continuing to trend up (TNA-related or contributing?) CBGs: In goal range <150 PO:  recorded 4/9, none recorded 4/10, BM x2 4/10 IVF: kvo'd PPx:  IV Protonix daily  Plan:  1) Continue Clinimix E 5/20 at 50 ml/hr pending further "d/c TNA" orders from  MD/PA 2) Continue Lipid emulsion 20% at 50ml/hr only MWF (to avoid over-feeding) 3) MVI and trace elements MWF only due to ongoing shortage 4) Continue CBGs q8h, no SSI ordered/required at this time 5) TNA lab panels on Mondays & Thursdays. - watch LFTs 6) Await tolerance of diet, hopefully can d/c TNA soon. If not soon, will need to come up with alternate plan soon due to rising LFTs (is tube feeding possible?)  Darrol Angel, PharmD Pager: [phone] 04/03/2012 11:45 AM

## 2012-04-04 ENCOUNTER — Inpatient Hospital Stay (HOSPITAL_COMMUNITY): Payer: Medicare Other

## 2012-04-04 LAB — BASIC METABOLIC PANEL
CO2: 23 mEq/L (ref 19–32)
Chloride: 99 mEq/L (ref 96–112)
Creatinine, Ser: 0.87 mg/dL (ref 0.50–1.35)
Glucose, Bld: 115 mg/dL — ABNORMAL HIGH (ref 70–99)

## 2012-04-04 LAB — CBC
HCT: 30.4 % — ABNORMAL LOW (ref 39.0–52.0)
MCH: 31.8 pg (ref 26.0–34.0)
MCV: 93.8 fL (ref 78.0–100.0)
RDW: 14.9 % (ref 11.5–15.5)
WBC: 9.9 10*3/uL (ref 4.0–10.5)

## 2012-04-04 MED ORDER — IOHEXOL 300 MG/ML  SOLN
60.0000 mL | Freq: Once | INTRAMUSCULAR | Status: AC | PRN
  Administered 2012-04-04: 60 mL via ORAL

## 2012-04-04 MED ORDER — TRACE MINERALS CR-CU-MN-SE-ZN 10-1000-500-60 MCG/ML IV SOLN
INTRAVENOUS | Status: AC
  Administered 2012-04-04: 18:00:00 via INTRAVENOUS
  Filled 2012-04-04: qty 2000

## 2012-04-04 MED ORDER — FUROSEMIDE 10 MG/ML IJ SOLN
40.0000 mg | Freq: Once | INTRAMUSCULAR | Status: AC
  Administered 2012-04-04: 40 mg via INTRAVENOUS
  Filled 2012-04-04 (×2): qty 4

## 2012-04-04 MED ORDER — FAT EMULSION 20 % IV EMUL
240.0000 mL | INTRAVENOUS | Status: AC
  Administered 2012-04-04: 240 mL via INTRAVENOUS
  Filled 2012-04-04: qty 250

## 2012-04-04 MED ORDER — METOPROLOL TARTRATE 100 MG PO TABS
100.0000 mg | ORAL_TABLET | Freq: Two times a day (BID) | ORAL | Status: DC
  Administered 2012-04-04 – 2012-04-08 (×9): 100 mg via ORAL
  Filled 2012-04-04 (×11): qty 1

## 2012-04-04 NOTE — Progress Notes (Signed)
PARENTERAL NUTRITION CONSULT NOTE - FOLLOW UP  Pharmacy Consult for TNA Indication: Duodenal Outlet Obstruction  Allergies  Allergen Reactions  . Diltiazem Hcl     REACTION: Severe lower extremity edema on generic cardizem  . Penicillins     Pt can tolerate cephalosporins/Pt tol Zosyn 11/01/11  . Plasma Human     Patient Measurements: Height: 5\' 10"  (177.8 cm) Weight: 206 lb 14.4 oz (93.849 kg) IBW/kg (Calculated) : 73  Adjusted Body Weight: 75 kg Recent weight loss: ~13 lb over 2 weeks  Vital Signs: Temp: 98.4 F (36.9 C) (04/12 0451) Temp src: Oral (04/12 0451) BP: 147/70 mmHg (04/12 0451) Pulse Rate: 101  (04/12 0451)  Intake/Output from previous day: 04/11 0701 - 04/12 0700 In: -  Out: 801 [Urine:800; Stool:1]  Labs:  [facility] 04/04/12 0446  WBC 9.9  HGB 10.3*  HCT 30.4*  PLT 300  APTT --  INR --    Basename 04/04/12 0446 04/03/12 0455 04/02/12 1005  NA 129* 126* 128*  K 4.3 4.1 4.1  CL 99 98 96  CO2 23 22 23   GLUCOSE 115* 123* 117*  BUN 19 23 25*  CREATININE 0.87 0.84 0.87  LABCREA -- -- --  CREAT24HRUR -- -- --  CALCIUM 8.4 8.1* 8.5  MG -- 1.8 --  PHOS -- 3.3 --  PROT -- 6.1 --  ALBUMIN -- 1.8* --  AST -- 171* --  ALT -- 149* --  ALKPHOS -- 283* --  BILITOT -- 0.5 --  BILIDIR -- -- --  IBILI -- -- --  PREALBUMIN -- -- --  TRIG -- -- --  CHOLHDL -- -- --  CHOL -- -- --   Estimated Creatinine Clearance: 88.3 ml/min (by C-G formula based on Cr of 0.87).   Insulin Requirements in the past 24 hours:  No sliding scale ordered currently, CBGs < 150s  Nutritional Goals:   Re-estimated recs per RD 4/4: Kcal: 2250-2500, Protein: 120-145g, Fluid: 2.2-2.5L  Clinimix E5/20 at goal rate of 100 ml/hr + Lipids 20% at 30ml/hr (MWF only) = 120 g protein (1.4 g/kg/day), average of 2318 kcal/day  (2592 kcal MWF, 2112 kcal TTSS)  Current Nutrition:  Clinimix E5/20 at 50 ml/hr Lipid emulsion 20% at 41ml/hr MWF only Low fiber diet (adv 4/10)  IVF:  NS at [facility]  Assessment: 70 YOM with metastatic colon cancer, CT scan 3/8 with dilated duodenal bulb, possible malignant obstruction, liver masses.  EGD 3/31, with duodenal outlet obstruction, attempt made for dilatation with balloon but lumen re-closed after balloon deflated. UGI 4/1, with complete obstruction in the post bulbar duodenum.  Patient is s/p exp lap and gastrojejunostomy 4/2.  TNA goal rate adjusted and advanced to goal 4/5, TNA taper started 4/10 per PA request. Lipid emulsion currently available for daily dosing, but due to high protein needs will not give daily to prevent excess kcal. Diet advanced to FLD 4/9am, then to low fiber on 4/10, then changed back to CLD 4/11 afternoon. Per MD/PA note from yesterday, pt isn't eating, having liquid BMs, and vomiting small amounts of clear liquids.  Lytes: Na low (unable to change TNA content), corrected Ca wnl. Other lytes wnl. LFTs: LFTs elevated prior to starting TNA, slight bump on 4/8, continuing to trend up (TNA-related or contributing?) - will change to lower dextrose-containing TNA fluid tonight to hopefully help with rising LFTs; recheck CMET in am CBGs: In goal range <150 PO:  Resource breeze TID ordered, but pt refusing. No PO intake recorded  4/10 or 4/11. Will increase TNA support slightly from 50% to 75% since pt not eating. IVF: kvo'd PPx:  IV Protonix daily, simethicone  Plan:  1) Change to Clinimix E 5/15 at 75 ml/hr (approx 75% support). (will provide approx 1760 kcals and 90g protein) 2) Change Lipid emulsion 20% at 92ml/hr daily (since now using a different TNA formula at a reduced rate). 3) MVI and trace elements MWF only due to ongoing shortage 4) Continue CBGs q8h, no SSI ordered/required at this time 5) CMET in am. - watch LFTs 6) Await MD orders to either d/c the TNA or go back to full support, based on tolerance of diet. If TNA isn't d/c'd soon, will need to come up with alternate plan due to rising LFTs (is tube  feeding possible?)  Darrol Angel, PharmD Pager: [phone] 04/04/2012 8:32 AM

## 2012-04-04 NOTE — Progress Notes (Signed)
10 Days Post-Op  Subjective: Feels better. Passed gas and had large BM. Still throws up a small amount from time to time  Objective: Vital signs in last 24 hours: Temp:  [98.4 F (36.9 C)-98.6 F (37 C)] 98.4 F (36.9 C) (04/12 0451) Pulse Rate:  [97-110] 101  (04/12 0451) Resp:  [19-26] 26  (04/12 0451) BP: (122-156)/(66-70) 147/70 mmHg (04/12 0451) SpO2:  [93 %-98 %] 95 % (04/12 0451) Weight:  [206 lb 14.4 oz (93.849 kg)] 206 lb 14.4 oz (93.849 kg) (04/12 0451)   Intake/Output from previous day: April 11, 2023 0701 - 04/12 0700 In: -  Out: 801 [Urine:800; Stool:1] Intake/Output this shift:     General appearance: alert and no distress GI: Soft, still distended.  Incision: healing well  Lab Results:   Basename 04/04/12 0446  WBC 9.9  HGB 10.3*  HCT 30.4*  PLT 300   BMET  Basename 04/04/12 0446 Apr 10, 2012 0455  NA 129* 126*  K 4.3 4.1  CL 99 98  CO2 23 22  GLUCOSE 115* 123*  BUN 19 23  CREATININE 0.87 0.84  CALCIUM 8.4 8.1*   PT/INR No results found for this basename: LABPROT:2,INR:2 in the last 72 hours ABG No results found for this basename: PHART:2,PCO2:2,PO2:2,HCO3:2 in the last 72 hours  MEDS, Scheduled    . bisacodyl  10 mg Rectal BID  . digoxin  0.125 mg Oral Daily  . enoxaparin  40 mg Subcutaneous Q24H  . feeding supplement  1 Container Oral TID BM  . furosemide  20 mg Intravenous Once  . furosemide  40 mg Intravenous Once  . HYDROmorphone PCA 0.3 mg/mL   Intravenous Q4H  . magic mouthwash  10 mL Oral TID  . metoprolol tartrate  100 mg Oral BID  . pantoprazole (PROTONIX) IV  40 mg Intravenous Q24H  . simethicone  80 mg Oral QID  . DISCONTD: metoprolol tartrate  75 mg Oral BID    Studies/Results: Dg Abd 2 Views  April 10, 2012  *RADIOLOGY REPORT*  Clinical Data: Small bowel obstruction.  ABDOMEN - 2 VIEW  Comparison: 03/24/2012  Findings: There is gaseous distention of the transverse colon. Oral contrast material is seen within within decompressed  descending colon.  No evidence of small bowel dilatation.  No free air.  No organomegaly.  IMPRESSION: Gaseous distention of the transverse colon.  No evidence of small bowel obstruction.  Original Report Authenticated By: Cyndie Chime, M.D.   Dg Kayleen Memos W/water Sol Cm  04/04/2012  *RADIOLOGY REPORT*  Clinical Data:  71 year old male with stage IV colon cancer status post gastrojejunostomy for treatment of duodenal obstruction.  UPPER GI SERIES WITH KUB  Technique:  Routine upper GI series was performed with 60 ml Omnipaque-300.  Fluoroscopy Time: 1.3 minutes  Comparison:  Abdominal films 2012/04/10 and earlier.  Preoperative CT abdomen pelvis 03/21/2012.  Findings: Preoperative scout view of the abdomen demonstrates chronic gaseous distention of the transverse colon.  There is contrast in the left colon and proximal sigmoid.  Small gas filled viscus in the epigastric region may represent the distal stomach. Midline skin staples.  The patient was given water soluble contrast to drink and he tolerated this well and without difficulty.  Contrast flowed into the stomach and promptly through the gastrojejunostomy.  Small bowel contrast then promptly transited through multiple nondilated proximal small bowel loops. No abnormal accumulation or leakage of contrast identified.  IMPRESSION: Status post gastrojejunostomy with no adverse features on water soluble contrast swallow.  Original Report Authenticated By:  H.LEE HALL III, M.D.    Assessment: s/p Procedure(s): GASTROJEJUNOSTOMY HERNIA REPAIR VENTRAL ADULT Reviewed UGI and good emptying of stomach. BM occurred after and may have been stimulated by the contrast.His transverse colon was still dilated on the films today but may be improved with recent BM and large amount of air he has passed  Plan: Advance diet Repeat KUB in AM to see if colon improved   LOS: 13 days     Currie Paris, MD, [facility],  [place] [phone]   04/04/2012 11:03 AM

## 2012-04-04 NOTE — Progress Notes (Signed)
Physical Therapy Treatment Patient Details Name: Kyle Love MRN: [id number] DOB: [birth year] Today's Date: 04/04/2012  PT Assessment/Plan  PT - Assessment/Plan Comments on Treatment Session: Pt assisted back to bed from bathroom and too fatigued to ambulate.  PT returned to ambulate with pt after he rested.  Pt with increased fatigue requiring multiple short standing rest breaks during ambulation. PT Plan: Discharge plan remains appropriate;Frequency remains appropriate Follow Up Recommendations: Home health PT Equipment Recommended: None recommended by PT PT Goals  Acute Rehab PT Goals PT Goal: Supine/Side to Sit - Progress: Progressing toward goal PT Goal: Sit to Supine/Side - Progress: Progressing toward goal PT Goal: Sit to Stand - Progress: Progressing toward goal PT Goal: Ambulate - Progress: Progressing toward goal  PT Treatment Precautions/Restrictions  Restrictions Weight Bearing Restrictions: No Mobility (including Balance) Bed Mobility Bed Mobility: Yes Supine to Sit: 5: Supervision Supine to Sit Details (indicate cue type and reason): assist to manage lines/oxygen Sit to Supine: 5: Supervision Transfers Transfers: Yes Sit to Stand: 5: Supervision Sit to Stand Details (indicate cue type and reason): assist with managing lines/oxygen, verbal cue for hand placemend Stand to Sit: 4: Min assist Stand to Sit Details: assist for controlling descent, verbal cues for hand placement Ambulation/Gait Ambulation/Gait: Yes Ambulation/Gait Assistance: 4: Min assist Ambulation/Gait Assistance Details (indicate cue type and reason): min/guard, pushed IV pole, multiple standing short rest breaks 2* fatigue, pt ambulated on 1L O2 and SaO2 dropped to 86% and pt reporting SOB so increased to 2L during ambulation (with increase to 93% SaO2) Ambulation Distance (Feet): 180 Feet Assistive device: None Gait Pattern: Step-through pattern;Decreased stride length Gait velocity:  decreased    Exercise    End of Session PT - End of Session Activity Tolerance: Patient limited by fatigue Patient left: in bed;with call bell in reach;with family/visitor present General Behavior During Session: [facility] for tasks performed Cognition: [facility] for tasks performed RN aware of SaO2.  Journee Bobrowski,KATHrine E 04/04/2012, 3:56 PM Pager: [phone]

## 2012-04-04 NOTE — Progress Notes (Signed)
Subjective: Late entry: Patient seen around 10 am today. Had a BM today. Still with some emesis.  Objective: Vital signs in last 24 hours: Temp:  [98.4 F (36.9 C)-98.6 F (37 C)] 98.4 F (36.9 C) (04/12 1350) Pulse Rate:  [97-110] 97  (04/12 1350) Resp:  [15-26] 15  (04/12 1350) BP: (122-156)/(67-70) 122/68 mmHg (04/12 1350) SpO2:  [93 %-98 %] 95 % (04/12 1350) Weight:  [93.849 kg (206 lb 14.4 oz)] 93.849 kg (206 lb 14.4 oz) (04/12 0451) Weight change: -0.551 kg (-1 lb 3.4 oz) Last BM Date: 2012/04/23  Intake/Output from previous day: 24-Apr-2023 0701 - 04/12 0700 In: -  Out: 801 [Urine:800; Stool:1]     Physical Exam: General: Alert, awake, oriented x3, in no acute distress. HEENT: No bruits, no goiter. Heart: Regular rate and rhythm, without murmurs, rubs, gallops. Lungs: Clear to auscultation bilaterally. Abdomen: Soft, nontender, nondistended, positive bowel sounds. Extremities: No clubbing cyanosis or edema with positive pedal pulses. Neuro: Grossly intact, nonfocal.    Lab Results: Basic Metabolic Panel:  Basename 04/04/12 0446 04/23/2012 0455  NA 129* 126*  K 4.3 4.1  CL 99 98  CO2 23 22  GLUCOSE 115* 123*  BUN 19 23  CREATININE 0.87 0.84  CALCIUM 8.4 8.1*  MG -- 1.8  PHOS -- 3.3   Liver Function Tests:  [facility] 04-23-2012 0455  AST 171*  ALT 149*  ALKPHOS 283*  BILITOT 0.5  PROT 6.1  ALBUMIN 1.8*   CBC:  Basename 04/04/12 0446  WBC 9.9  NEUTROABS --  HGB 10.3*  HCT 30.4*  MCV 93.8  PLT 300   CBG:  Basename 04/04/12 1723 04/04/12 0744 04/23/2012 1728 2012-04-23 0732 04/02/12 1803 04/02/12 0740  GLUCAP 112* 121* 123* 109* 106* 114*    Studies/Results: Dg Abd 2 Views  04-23-2012  *RADIOLOGY REPORT*  Clinical Data: Small bowel obstruction.  ABDOMEN - 2 VIEW  Comparison: 03/24/2012  Findings: There is gaseous distention of the transverse colon. Oral contrast material is seen within within decompressed descending colon.  No evidence of small bowel  dilatation.  No free air.  No organomegaly.  IMPRESSION: Gaseous distention of the transverse colon.  No evidence of small bowel obstruction.  Original Report Authenticated By: Cyndie Chime, M.D.   Dg Kayleen Memos W/water Sol Cm  04/04/2012  *RADIOLOGY REPORT*  Clinical Data:  71 year old male with stage IV colon cancer status post gastrojejunostomy for treatment of duodenal obstruction.  UPPER GI SERIES WITH KUB  Technique:  Routine upper GI series was performed with 60 ml Omnipaque-300.  Fluoroscopy Time: 1.3 minutes  Comparison:  Abdominal films 04-23-12 and earlier.  Preoperative CT abdomen pelvis 03/21/2012.  Findings: Preoperative scout view of the abdomen demonstrates chronic gaseous distention of the transverse colon.  There is contrast in the left colon and proximal sigmoid.  Small gas filled viscus in the epigastric region may represent the distal stomach. Midline skin staples.  The patient was given water soluble contrast to drink and he tolerated this well and without difficulty.  Contrast flowed into the stomach and promptly through the gastrojejunostomy.  Small bowel contrast then promptly transited through multiple nondilated proximal small bowel loops. No abnormal accumulation or leakage of contrast identified.  IMPRESSION: Status post gastrojejunostomy with no adverse features on water soluble contrast swallow.  Original Report Authenticated By: Harley Hallmark, M.D.    Medications: Scheduled Meds:   . bisacodyl  10 mg Rectal BID  . digoxin  0.125 mg Oral Daily  . enoxaparin  40 mg Subcutaneous Q24H  . feeding supplement  1 Container Oral TID BM  . furosemide  20 mg Intravenous Once  . furosemide  40 mg Intravenous Once  . HYDROmorphone PCA 0.3 mg/mL   Intravenous Q4H  . magic mouthwash  10 mL Oral TID  . metoprolol tartrate  100 mg Oral BID  . pantoprazole (PROTONIX) IV  40 mg Intravenous Q24H  . simethicone  80 mg Oral QID  . DISCONTD: metoprolol tartrate  75 mg Oral BID    Continuous Infusions:   . sodium chloride 20 mL/hr at 04/02/12 2328  . fat emulsion 240 mL (04/04/12 1737)  . TPN (CLINIMIX) +/- additives 75 mL/hr at 04/04/12 1737  . 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 Postop day 9 gastrojejunostomy. Had BM. CCS managing. Diet is being advanced today. Repeat KUB in am.  Rest of chronic medical issues are currently stable.    LOS: 13 days   HERNANDEZ ACOSTA, [facility] Pager: [phone] 04/04/2012, 6:58 PM

## 2012-04-04 NOTE — Progress Notes (Signed)
SUBJECTIVE:  Patient denies CP or SOB; some N/V yesterday  OBJECTIVE:   Vitals:   Filed Vitals:   04/03/12 2114 04/04/12 0007 04/04/12 0400 04/04/12 0451  BP: 156/67   147/70  Pulse: 110   101  Temp: 98.6 F (37 C)   98.4 F (36.9 C)  TempSrc: Oral   Oral  Resp: 19 22 23 26   Height:      Weight:    206 lb 14.4 oz (93.849 kg)  SpO2: 96% 97% 93% 95%   I&O's:    Intake/Output Summary (Last 24 hours) at 04/04/12 0655 Last data filed at 04/03/12 2116  Gross per 24 hour  Intake    480 ml  Output    801 ml  Net   -321 ml   TELEMETRY: Reviewed telemetry pt in atrial fibrillation; High normal rate     PHYSICAL EXAM General: Well developed, well nourished, in no acute distress Lungs:   Mildly diminished BS bases Heart:   Irregularly irregular  Abdomen: Bowel sounds are positive, mildly distended; s/p abdominal surgery Extremities:  1+ edema.   Neuro: Alert and oriented X 3.   LABS: Basic Metabolic Panel:  Basename 04/04/12 0446 04/03/12 0455  NA 129* 126*  K 4.3 4.1  CL 99 98  CO2 23 22  GLUCOSE 115* 123*  BUN 19 23  CREATININE 0.87 0.84  CALCIUM 8.4 8.1*  MG -- 1.8  PHOS -- 3.3   CBC:  Basename 04/04/12 0446  WBC 9.9  NEUTROABS --  HGB 10.3*  HCT 30.4*  MCV 93.8  PLT 300   Coag Panel:   Lab Results  Component Value Date   INR 1.05 05/31/2011   INR 1.09 05/21/2011   INR 2.08* 05/11/2011         ASSESSMENT: 1. Atrial flutter/fib with RVR - HR upper normal; change lopressor to 100 mg po BID. Continue Dig. No coumadin given CA and ongoing need for chemotherapy. 2. Metastatic colon cancer status post gastrojejunostomy for treatment of bowel obstruction  3. Aortic valve disease status post tissue aVR with normal prosthetic valve function  4. Cardiomyopathy with left ventricular ejection fraction 40-45% by echocardiogram in 2012; Patient volume overloaded; There may be a contribution from decreased oncotic pressure from low albumin. Lasix 40 mg IV today;  watch renal function and NA.  Olga Millers, MD  04/04/2012  6:55 AM

## 2012-04-05 ENCOUNTER — Inpatient Hospital Stay (HOSPITAL_COMMUNITY): Payer: Medicare Other

## 2012-04-05 LAB — COMPREHENSIVE METABOLIC PANEL
ALT: 129 U/L — ABNORMAL HIGH (ref 0–53)
Alkaline Phosphatase: 301 U/L — ABNORMAL HIGH (ref 39–117)
CO2: 25 mEq/L (ref 19–32)
Calcium: 8.4 mg/dL (ref 8.4–10.5)
GFR calc Af Amer: >90 mL/min (ref >90)
GFR calc non Af Amer: 81 mL/min — ABNORMAL LOW (ref >90)
Glucose, Bld: 112 mg/dL — ABNORMAL HIGH (ref 70–99)
Potassium: 4 mEq/L (ref 3.5–5.1)
Sodium: 130 mEq/L — ABNORMAL LOW (ref 135–145)

## 2012-04-05 LAB — GLUCOSE, CAPILLARY

## 2012-04-05 MED ORDER — FAT EMULSION 20 % IV EMUL
240.0000 mL | INTRAVENOUS | Status: AC
  Administered 2012-04-05: 240 mL via INTRAVENOUS
  Filled 2012-04-05: qty 250

## 2012-04-05 MED ORDER — METOCLOPRAMIDE HCL 5 MG/ML IJ SOLN
5.0000 mg | Freq: Four times a day (QID) | INTRAMUSCULAR | Status: AC
  Administered 2012-04-05 – 2012-04-08 (×12): 5 mg via INTRAVENOUS
  Filled 2012-04-05 (×2): qty 2
  Filled 2012-04-05: qty 1
  Filled 2012-04-05: qty 2
  Filled 2012-04-05 (×2): qty 1
  Filled 2012-04-05: qty 2
  Filled 2012-04-05 (×5): qty 1

## 2012-04-05 MED ORDER — HYDROCODONE-ACETAMINOPHEN 5-325 MG PO TABS
1.0000 | ORAL_TABLET | ORAL | Status: DC | PRN
  Administered 2012-04-06 – 2012-04-07 (×4): 1 via ORAL
  Filled 2012-04-05 (×5): qty 1

## 2012-04-05 MED ORDER — CLINIMIX E/DEXTROSE (5/15) 5 % IV SOLN
INTRAVENOUS | Status: AC
  Administered 2012-04-05: 18:00:00 via INTRAVENOUS
  Filled 2012-04-05: qty 2000

## 2012-04-05 MED ORDER — FUROSEMIDE 40 MG PO TABS
40.0000 mg | ORAL_TABLET | Freq: Two times a day (BID) | ORAL | Status: DC
  Administered 2012-04-05 – 2012-04-08 (×7): 40 mg via ORAL
  Filled 2012-04-05 (×8): qty 1

## 2012-04-05 MED ORDER — HYDROMORPHONE HCL PF 2 MG/ML IJ SOLN
2.0000 mg | INTRAMUSCULAR | Status: DC | PRN
  Administered 2012-04-05 – 2012-04-08 (×9): 2 mg via INTRAVENOUS
  Filled 2012-04-05 (×9): qty 1

## 2012-04-05 NOTE — Progress Notes (Signed)
Subjective: Sleeping. Wife and daughter present and updated on plan of care. Is now having BMs. Was able to have some solid food last night and had a cup of coffee this am. KUB pending.  Objective: Vital signs in last 24 hours: Temp:  [98.4 F (36.9 C)-98.8 F (37.1 C)] 98.8 F (37.1 C) (04/13 0515) Pulse Rate:  [97-102] 101  (04/13 0515) Resp:  [15-26] 20  (04/13 0515) BP: (122-160)/(60-79) 148/60 mmHg (04/13 0515) SpO2:  [93 %-98 %] 93 % (04/13 1005) Weight:  [92.3 kg (203 lb 7.8 oz)] 92.3 kg (203 lb 7.8 oz) (04/13 0455) Weight change: -1.549 kg (-3 lb 6.7 oz) Last BM Date: 04/05/12  Intake/Output from previous day: 04/12 0701 - 04/13 0700 In: -  Out: 600 [Urine:600]     Physical Exam: Deferred as patient sleeping.   Lab Results: Basic Metabolic Panel:  Basename 04/05/12 0501 04/04/12 0446 04/10/12 0455  NA 130* 129* --  K 4.0 4.3 --  CL 97 99 --  CO2 25 23 --  GLUCOSE 112* 115* --  BUN 21 19 --  CREATININE 0.97 0.87 --  CALCIUM 8.4 8.4 --  MG -- -- 1.8  PHOS -- -- 3.3   Liver Function Tests:  [facility] 04/05/12 0501 April 10, 2012 0455  AST 88* 171*  ALT 129* 149*  ALKPHOS 301* 283*  BILITOT 0.3 0.5  PROT 6.5 6.1  ALBUMIN 1.9* 1.8*   CBC:  Basename 04/04/12 0446  WBC 9.9  NEUTROABS --  HGB 10.3*  HCT 30.4*  MCV 93.8  PLT 300   CBG:  Basename 04/05/12 0804 04/04/12 1723 04/04/12 0744 04/10/2012 1728 04/10/2012 0732 04/02/12 1803  GLUCAP 109* 112* 121* 123* 109* 106*    Studies/Results: Dg Abd 2 Views  04-10-12  *RADIOLOGY REPORT*  Clinical Data: Small bowel obstruction.  ABDOMEN - 2 VIEW  Comparison: 03/24/2012  Findings: There is gaseous distention of the transverse colon. Oral contrast material is seen within within decompressed descending colon.  No evidence of small bowel dilatation.  No free air.  No organomegaly.  IMPRESSION: Gaseous distention of the transverse colon.  No evidence of small bowel obstruction.  Original Report Authenticated By:  Cyndie Chime, M.D.   Dg Kayleen Memos W/water Sol Cm  04/04/2012  *RADIOLOGY REPORT*  Clinical Data:  71 year old male with stage IV colon cancer status post gastrojejunostomy for treatment of duodenal obstruction.  UPPER GI SERIES WITH KUB  Technique:  Routine upper GI series was performed with 60 ml Omnipaque-300.  Fluoroscopy Time: 1.3 minutes  Comparison:  Abdominal films 10-Apr-2012 and earlier.  Preoperative CT abdomen pelvis 03/21/2012.  Findings: Preoperative scout view of the abdomen demonstrates chronic gaseous distention of the transverse colon.  There is contrast in the left colon and proximal sigmoid.  Small gas filled viscus in the epigastric region may represent the distal stomach. Midline skin staples.  The patient was given water soluble contrast to drink and he tolerated this well and without difficulty.  Contrast flowed into the stomach and promptly through the gastrojejunostomy.  Small bowel contrast then promptly transited through multiple nondilated proximal small bowel loops. No abnormal accumulation or leakage of contrast identified.  IMPRESSION: Status post gastrojejunostomy with no adverse features on water soluble contrast swallow.  Original Report Authenticated By: Harley Hallmark, M.D.    Medications: Scheduled Meds:   . bisacodyl  10 mg Rectal BID  . digoxin  0.125 mg Oral Daily  . enoxaparin  40 mg Subcutaneous Q24H  . feeding supplement  1 Container Oral TID BM  . furosemide  20 mg Intravenous Once  . furosemide  40 mg Oral BID  . magic mouthwash  10 mL Oral TID  . metoprolol tartrate  100 mg Oral BID  . pantoprazole (PROTONIX) IV  40 mg Intravenous Q24H  . simethicone  80 mg Oral QID  . DISCONTD: HYDROmorphone PCA 0.3 mg/mL   Intravenous Q4H   Continuous Infusions:   . sodium chloride 20 mL/hr at 04/02/12 2328  . fat emulsion 240 mL (04/04/12 1737)  . TPN (CLINIMIX) +/- additives     And  . fat emulsion    . TPN (CLINIMIX) +/- additives 75 mL/hr at 04/04/12 1737  .  TPN (CLINIMIX) +/- additives 50 mL/hr at 04/03/12 1809   PRN Meds:.HYDROcodone-acetaminophen, HYDROmorphone (DILAUDID) injection, ondansetron, simethicone, sodium chloride, DISCONTD: diphenhydrAMINE, DISCONTD: diphenhydrAMINE, DISCONTD: naloxone, DISCONTD: ondansetron (ZOFRAN) IV, DISCONTD: sodium chloride  Assessment/Plan:  Principal Problem:  *Duodenal stenosis Active Problems:  COLON CANCER  Atrial fibrillation  ANEMIA-IRON DEFICIENCY  Hyponatremia  Bowel obstruction  Transaminitis   #1 Duodenal Stenosis 2/2 malignant colon cancer: POD 11 gastrojejunostomy. PO tolerance improving, having BMs. Await today's KUB. Surgery managing.  #2 Afib: No anticoagulation given ongoing medical issues. HR controlled, mostly in the 90s.  #3 H/o systolic CHF: Cards has added some lasix given his increased volume overload. Does not appear to have overt CHF exacerbation.  #4 Transaminitis: improving. Suspect related to colon ca +/- TNA.   LOS: 14 days   HERNANDEZ ACOSTA,Jasher Barkan [facility] Pager: [phone] 04/05/2012, 11:34 AM

## 2012-04-05 NOTE — Progress Notes (Signed)
Surgeon discontinued PCA pump.  Wasted 4 ml and witnessed by Henreitta Cea.  Order discontinued by Pharm and unable to waste in the pyxis.

## 2012-04-05 NOTE — Progress Notes (Signed)
Patient ambulated to nursing station and back on 2L [place].  Less exertion than previous day.

## 2012-04-05 NOTE — Progress Notes (Signed)
Subjective:  No c/o chest pain or SOB  Objective:  Vital Signs in the last 24 hours: BP 148/60  Pulse 101  Temp(Src) 98.8 F (37.1 C) (Oral)  Resp 20  Ht 5\' 10"  (1.778 m)  Wt 92.3 kg (203 lb 7.8 oz)  BMI 29.20 kg/m2  SpO2 94%  Physical Exam:  Lungs:  Clear Cardiac:  Regular rhythm, normal S1 and S2, no S3, 1-2/6 systolic murmur Abdomen:  Soft, mildly distended Extremities:  2+ edema  Intake/Output from previous day: 04/12 0701 - 04/13 0700 In: -  Out: 600 [Urine:600]  Weight Filed Weights   04/03/12 0533 04/04/12 0451 04/05/12 0455  Weight: 94.4 kg (208 lb 1.8 oz) 93.849 kg (206 lb 14.4 oz) 92.3 kg (203 lb 7.8 oz)   Lab Results: Basic Metabolic Panel:  Basename 04/05/12 0501 04/04/12 0446  NA 130* 129*  K 4.0 4.3  CL 97 99  CO2 25 23  GLUCOSE 112* 115*  BUN 21 19  CREATININE 0.97 0.87   CBC:  Basename 04/04/12 0446  WBC 9.9  NEUTROABS --  HGB 10.3*  HCT 30.4*  MCV 93.8  PLT 300   Telemetry: Atrial fib with rate around 100  Assessment/Plan:  1. Atrial flutter/fib with RVR - HR upper normal;  No coumadin given CA and ongoing need for chemotherapy.  2. Metastatic colon cancer status post gastrojejunostomy for treatment of bowel obstruction  3. Aortic valve disease status post tissue aVR with normal prosthetic valve function  4. Cardiomyopathy with left ventricular ejection fraction 40-45% by echocardiogram in 2012; Patient volume overloaded;  Hyponatremia 6. Volume overload with edema  Weight up 20 pounds this admission  Rec:  Needs to diurese some will add ongoing Lasix for now.  Advance activity.  Darden Palmer  MD [facility] 04/05/2012, 8:04 AM

## 2012-04-05 NOTE — Progress Notes (Addendum)
Removed staples. Tolerated well.

## 2012-04-05 NOTE — Progress Notes (Signed)
11 Days Post-Op  Subjective: Feels better today. Less pain, having BM's taking some PO.   Objective: Vital signs in last 24 hours: Temp:  [98.4 F (36.9 C)-98.8 F (37.1 C)] 98.8 F (37.1 C) (04/13 0515) Pulse Rate:  [97-102] 101  (04/13 0515) Resp:  [15-26] 20  (04/13 0515) BP: (122-160)/(60-79) 148/60 mmHg (04/13 0515) SpO2:  [94 %-98 %] 94 % (04/13 0515) Weight:  [203 lb 7.8 oz (92.3 kg)] 203 lb 7.8 oz (92.3 kg) (04/13 0455)   Intake/Output from previous day: 04/12 0701 - 04/13 0700 In: -  Out: 600 [Urine:600] Intake/Output this shift:     General appearance: alert, cooperative and no distress GI: Remains distended but soft and not tender  Incision: healing well  Lab Results:   Basename 04/04/12 0446  WBC 9.9  HGB 10.3*  HCT 30.4*  PLT 300   BMET  Basename 04/05/12 0501 04/04/12 0446  NA 130* 129*  K 4.0 4.3  CL 97 99  CO2 25 23  GLUCOSE 112* 115*  BUN 21 19  CREATININE 0.97 0.87  CALCIUM 8.4 8.4   PT/INR No results found for this basename: LABPROT:2,INR:2 in the last 72 hours ABG No results found for this basename: PHART:2,PCO2:2,PO2:2,HCO3:2 in the last 72 hours  MEDS, Scheduled    . bisacodyl  10 mg Rectal BID  . digoxin  0.125 mg Oral Daily  . enoxaparin  40 mg Subcutaneous Q24H  . feeding supplement  1 Container Oral TID BM  . furosemide  20 mg Intravenous Once  . furosemide  40 mg Intravenous Once  . furosemide  40 mg Oral BID  . HYDROmorphone PCA 0.3 mg/mL   Intravenous Q4H  . magic mouthwash  10 mL Oral TID  . metoprolol tartrate  100 mg Oral BID  . pantoprazole (PROTONIX) IV  40 mg Intravenous Q24H  . simethicone  80 mg Oral QID    Studies/Results: Dg Abd 2 Views  May 01, 2012  *RADIOLOGY REPORT*  Clinical Data: Small bowel obstruction.  ABDOMEN - 2 VIEW  Comparison: 03/24/2012  Findings: There is gaseous distention of the transverse colon. Oral contrast material is seen within within decompressed descending colon.  No evidence of  small bowel dilatation.  No free air.  No organomegaly.  IMPRESSION: Gaseous distention of the transverse colon.  No evidence of small bowel obstruction.  Original Report Authenticated By: Cyndie Chime, M.D.   Dg Kayleen Memos W/water Sol Cm  04/04/2012  *RADIOLOGY REPORT*  Clinical Data:  71 year old male with stage IV colon cancer status post gastrojejunostomy for treatment of duodenal obstruction.  UPPER GI SERIES WITH KUB  Technique:  Routine upper GI series was performed with 60 ml Omnipaque-300.  Fluoroscopy Time: 1.3 minutes  Comparison:  Abdominal films 2012-05-01 and earlier.  Preoperative CT abdomen pelvis 03/21/2012.  Findings: Preoperative scout view of the abdomen demonstrates chronic gaseous distention of the transverse colon.  There is contrast in the left colon and proximal sigmoid.  Small gas filled viscus in the epigastric region may represent the distal stomach. Midline skin staples.  The patient was given water soluble contrast to drink and he tolerated this well and without difficulty.  Contrast flowed into the stomach and promptly through the gastrojejunostomy.  Small bowel contrast then promptly transited through multiple nondilated proximal small bowel loops. No abnormal accumulation or leakage of contrast identified.  IMPRESSION: Status post gastrojejunostomy with no adverse features on water soluble contrast swallow.  Original Report Authenticated By: Harley Hallmark, M.D.  Assessment: s/p Procedure(s): GASTROJEJUNOSTOMY HERNIA REPAIR VENTRAL ADULT Seems to be better today. KUB pending, but clinically looks better.  Plan: Await KUB. Will d/c staples and change to po med with IV breakthru    LOS: 14 days     Kyle Paris, MD, [facility], [place] [phone]   04/05/2012 9:30 AM

## 2012-04-05 NOTE — Progress Notes (Signed)
PARENTERAL NUTRITION CONSULT NOTE - FOLLOW UP  Pharmacy Consult for TNA Indication: Duodenal Outlet Obstruction  Allergies  Allergen Reactions  . Diltiazem Hcl     REACTION: Severe lower extremity edema on generic cardizem  . Penicillins     Pt can tolerate cephalosporins/Pt tol Zosyn 11/01/11  . Plasma Human     Patient Measurements: Height: 5\' 10"  (177.8 cm) Weight: 203 lb 7.8 oz (92.3 kg) (standup scale C-West) IBW/kg (Calculated) : 73  Adjusted Body Weight: 75 kg Recent weight loss: ~13 lb over 2 weeks  Vital Signs: Temp: 98.8 F (37.1 C) (04/13 0515) Temp src: Oral (04/13 0515) BP: 148/60 mmHg (04/13 0515) Pulse Rate: 101  (04/13 0515)  Intake/Output from previous day: 04/12 0701 - 04/13 0700 In: -  Out: 600 [Urine:600]  Labs:  [facility] 04/04/12 0446  WBC 9.9  HGB 10.3*  HCT 30.4*  PLT 300  APTT --  INR --    Basename 04/05/12 0501 04/04/12 0446 04/03/12 0455  NA 130* 129* 126*  K 4.0 4.3 4.1  CL 97 99 98  CO2 25 23 22   GLUCOSE 112* 115* 123*  BUN 21 19 23   CREATININE 0.97 0.87 0.84  LABCREA -- -- --  CREAT24HRUR -- -- --  CALCIUM 8.4 8.4 8.1*  MG -- -- 1.8  PHOS -- -- 3.3  PROT 6.5 -- 6.1  ALBUMIN 1.9* -- 1.8*  AST 88* -- 171*  ALT 129* -- 149*  ALKPHOS 301* -- 283*  BILITOT 0.3 -- 0.5  BILIDIR -- -- --  IBILI -- -- --  PREALBUMIN -- -- --  TRIG -- -- --  CHOLHDL -- -- --  CHOL -- -- --   Estimated Creatinine Clearance: 78.6 ml/min (by C-G formula based on Cr of 0.97).   Insulin Requirements in the past 24 hours:  No sliding scale ordered currently, CBGs < 150s  Nutritional Goals:   Re-estimated recs per RD 4/4: Kcal: 2250-2500, Protein: 120-145g, Fluid: 2.2-2.5L  Clinimix E5/15 at 75 ml/hr + Lipids 20% =  approx 1760 kcals and 90g protein daily  Current Nutrition:  Clinimix E5/15 at 75 ml/hr Lipid emulsion 20% at 1ml/hr  Diet: post-gastrectomy (4/12)  IVF: NS at [facility]  Assessment: 70 YOM with metastatic colon cancer, CT  scan 3/8 with dilated duodenal bulb, possible malignant obstruction, liver masses.  EGD 3/31, with duodenal outlet obstruction, attempt made for dilatation with balloon but lumen re-closed after balloon deflated. UGI 4/1, with complete obstruction in the post bulbar duodenum.  Patient is s/p exp lap and gastrojejunostomy 4/2.  TNA goal rate adjusted and advanced to goal 4/5, TNA taper started 4/10 per PA request. Lipid emulsion currently available for daily dosing. Diet advanced to FLD 4/9am, low fiber on 4/10, then back to CLD 4/11, advanced to post-gastrectomy on 4/12. Per MD pt having BMs, taking some PO.  Weight up 20 lbs this admission (volume overload w/edema), lasix added for diuresis.  Lytes: Na remains low (unable to change TNA content), corrected Ca wnl. Other lytes wnl. LFTs: elevated prior to starting TNA, bumped on 4/8, cont to inc on 4/11, but today have improved slightly.  (TNA-related or contributing?) TNA formula changed 4/12 to lower dextrose concentration to help with rising LFTs; recheck CMET Monday. CBGs: In goal range <150 PO:  Resource breeze TID ordered, but pt refusing. No PO intake recorded. IVF: kvo'd PPx:  IV Protonix daily, simethicone  Plan:  1) Continue Clinimix E 5/15 at 75 ml/hr (approx 75% support).  2) Continue Lipid emulsion 20% at 56ml/hr daily 3) MVI and trace elements MWF only due to ongoing shortage 4) Continue CBGs q8h, no SSI ordered/required at this time 5) TNA lab panels on Mondays & Thursdays - follow up LFTs 6) Await MD orders to either d/c the TNA or go back to full support, based on tolerance of diet. If TNA isn't d/c'd soon, will need to come up with alternate plan due to rising LFTs (is tube feeding possible?)  Lynann Beaver PharmD, BCPS Pager [phone] 04/05/2012 9:49 AM

## 2012-04-05 NOTE — Progress Notes (Signed)
Reviewed his xray and contrast is in distal colon from UGI yesterday. Transverse colon is decompressed compared to yesterday, has moderate air in stomach.  Will add Reglan to see if this will help gastric motility

## 2012-04-06 LAB — BASIC METABOLIC PANEL
CO2: 26 mEq/L (ref 19–32)
Calcium: 8.1 mg/dL — ABNORMAL LOW (ref 8.4–10.5)
Glucose, Bld: 107 mg/dL — ABNORMAL HIGH (ref 70–99)
Potassium: 3.5 mEq/L (ref 3.5–5.1)
Sodium: 128 mEq/L — ABNORMAL LOW (ref 135–145)

## 2012-04-06 MED ORDER — MIRTAZAPINE 15 MG PO TBDP
15.0000 mg | ORAL_TABLET | Freq: Every day | ORAL | Status: DC
  Administered 2012-04-06 – 2012-04-07 (×2): 15 mg via ORAL
  Filled 2012-04-06 (×3): qty 1

## 2012-04-06 MED ORDER — FAT EMULSION 20 % IV EMUL
240.0000 mL | INTRAVENOUS | Status: AC
  Administered 2012-04-06: 240 mL via INTRAVENOUS
  Filled 2012-04-06: qty 250

## 2012-04-06 MED ORDER — CLINIMIX E/DEXTROSE (5/15) 5 % IV SOLN
INTRAVENOUS | Status: AC
  Administered 2012-04-06: 17:00:00 via INTRAVENOUS
  Filled 2012-04-06: qty 1800

## 2012-04-06 NOTE — Progress Notes (Signed)
Subjective:  No c/o chest pain or SOB, starting to pass some gas  Objective:  Vital Signs in the last 24 hours: BP 122/74  Pulse 100  Temp(Src) 97.7 F (36.5 C) (Oral)  Resp 20  Ht 5\' 10"  (1.778 m)  Wt 90.5 kg (199 lb 8.3 oz)  BMI 28.63 kg/m2  SpO2 96%  Physical Exam: Pleasant WM in NAD Lungs:  Rhonchi today bilat Cardiac:  Regular rhythm, normal S1 and S2, no S3, 1-2/6 systolic murmur Abdomen:  Soft, mildly distended Extremities:  2+ edema  Intake/Output from previous day: 04/13 0701 - 04/14 0700 In: 4293.1 [P.O.:120; IV Piggyback:12; TPN:4161.1] Out: 800 [Urine:800]  Weight Filed Weights   04/04/12 0451 04/05/12 0455 04/06/12 0510  Weight: 93.849 kg (206 lb 14.4 oz) 92.3 kg (203 lb 7.8 oz) 90.5 kg (199 lb 8.3 oz)   Lab Results: Basic Metabolic Panel:  Basename 04/06/12 0508 04/05/12 0501  NA 128* 130*  K 3.5 4.0  CL 96 97  CO2 26 25  GLUCOSE 107* 112*  BUN 23 21  CREATININE 0.94 0.97   CBC:  Basename 04/04/12 0446  WBC 9.9  NEUTROABS --  HGB 10.3*  HCT 30.4*  MCV 93.8  PLT 300   Telemetry: Atrial fib with rate around 100  Assessment/Plan:  1. Atrial flutter/fib with RVR - HR upper normal;  No coumadin given CA and ongoing need for chemotherapy.  2. Metastatic colon cancer status post gastrojejunostomy for treatment of bowel obstruction  3. Aortic valve disease status post tissue aVR with normal prosthetic valve function  4. Cardiomyopathy with left ventricular ejection fraction 40-45% by echocardiogram in 2012; Patient volume overloaded;  Hyponatremia 6. Volume overload with edema  Weight down 4 pounds from yesterday  Rec:  Diurese esp since still getting TPN.  Weights in chart seem to indicate a baseline weight around 180.  W. Ashley Royalty  MD [facility] 04/06/2012, 8:33 AM

## 2012-04-06 NOTE — Progress Notes (Signed)
Pt still refuses food or drink, ice chips even encouraged but pt does not want.  Has ambulated in halls w/much encouragement. Genia Harold

## 2012-04-06 NOTE — Progress Notes (Signed)
Subjective: Not very hungry. Poor PO intake. Had a couple of BMs yesterday.  Objective: Vital signs in last 24 hours: Temp:  [97.6 F (36.4 C)-98.1 F (36.7 C)] 97.7 F (36.5 C) (04/14 0510) Pulse Rate:  [97-101] 100  (04/14 0510) Resp:  [19-20] 20  (04/14 0510) BP: (122-137)/(69-77) 122/74 mmHg (04/14 0510) SpO2:  [95 %-97 %] 96 % (04/14 0510) Weight:  [90.5 kg (199 lb 8.3 oz)] 90.5 kg (199 lb 8.3 oz) (04/14 0510) Weight change: -1.8 kg (-3 lb 15.5 oz) Last BM Date: 04/05/12  Intake/Output from previous day: 04/13 0701 - 04/14 0700 In: 4293.1 [P.O.:120; IV Piggyback:12; TPN:4161.1] Out: 800 [Urine:800]     Physical Exam: General: Alert, awake, oriented x3, in no acute distress.  HEENT: No bruits, no goiter.  Heart: Regular rate and rhythm, without murmurs, rubs, gallops.  Lungs: Clear to auscultation bilaterally.  Abdomen: Soft, nontender, nondistended, positive bowel sounds.  Extremities: No clubbing cyanosis or edema with positive pedal pulses.  Neuro: Grossly intact, nonfocal.    Lab Results: Basic Metabolic Panel:  Basename 04/06/12 0508 04/05/12 0501  NA 128* 130*  K 3.5 4.0  CL 96 97  CO2 26 25  GLUCOSE 107* 112*  BUN 23 21  CREATININE 0.94 0.97  CALCIUM 8.1* 8.4  MG -- --  PHOS -- --   Liver Function Tests:  Basename 04/05/12 0501  AST 88*  ALT 129*  ALKPHOS 301*  BILITOT 0.3  PROT 6.5  ALBUMIN 1.9*   CBC:  Basename 04/04/12 0446  WBC 9.9  NEUTROABS --  HGB 10.3*  HCT 30.4*  MCV 93.8  PLT 300   CBG:  Basename 04/06/12 0745 04/05/12 2149 04/05/12 1759 04/05/12 0804 04/04/12 1723 04/04/12 0744  GLUCAP 120* 104* 117* 109* 112* 121*    Studies/Results: Dg Abd 1 View  04/05/2012  *RADIOLOGY REPORT*  Clinical Data: Abdominal pain.  History of duodenal stenosis and gastrojejunostomy.  ABDOMEN - 1 VIEW  Comparison: 04/04/2012 upper GI and 04/03/2029 radiographs  Findings: Oral contrast in the colon is noted. No dilated bowel loops are  present. The gas filled colon is decreased in caliber. Recent abdominal surgical changes are again noted.  IMPRESSION: Oral contrast within the colon.  Otherwise unremarkable bowel gas pattern.  Original Report Authenticated By: Rosendo Gros, M.D.    Medications: Scheduled Meds:    . bisacodyl  10 mg Rectal BID  . digoxin  0.125 mg Oral Daily  . enoxaparin  40 mg Subcutaneous Q24H  . feeding supplement  1 Container Oral TID BM  . furosemide  40 mg Oral BID  . magic mouthwash  10 mL Oral TID  . metoCLOPramide (REGLAN) injection  5 mg Intravenous Q6H  . metoprolol tartrate  100 mg Oral BID  . mirtazapine  15 mg Oral QHS  . pantoprazole (PROTONIX) IV  40 mg Intravenous Q24H  . simethicone  80 mg Oral QID  . DISCONTD: furosemide  20 mg Intravenous Once   Continuous Infusions:    . sodium chloride 20 mL/hr at 04/02/12 2328  . fat emulsion 240 mL (04/04/12 1737)  . TPN (CLINIMIX) +/- additives 75 mL/hr at 04/05/12 1808   And  . fat emulsion 240 mL (04/05/12 1809)  . TPN (CLINIMIX) +/- additives     And  . fat emulsion    . 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 PO tolerance improving, altho he has a poor appetite. Having BMs.  Surgery managing. Agree with starting remeron to see if it may stimulate appetite. Can also consider adding megace.  #2 Afib: No anticoagulation given ongoing medical issues. HR controlled, mostly in the 90s. Cardiology following,  #3 H/o systolic CHF: Cards has added some lasix given his increased volume overload. Does not appear to have overt CHF  exacerbation.  #4 Transaminitis: improving. Suspect related to colon ca +/- TNA.  #5 Dispo: Surgery: would you consider assuming care of this patient as we are not providing any additional care for him? Thank you.   LOS: 15 days   HERNANDEZ ACOSTA,Mellonie Guess [facility] Pager: [phone] 04/06/2012, 10:05 AM

## 2012-04-06 NOTE — Progress Notes (Signed)
12 Days Post-Op  Subjective: No appetite and taking very little in PO. No nausea. Had some abd pain during the night, but now gone. Had two large BM's yesterday and passing gas.  Objective: Vital signs in last 24 hours: Temp:  [97.6 F (36.4 C)-98.1 F (36.7 C)] 97.7 F (36.5 C) (04/14 0510) Pulse Rate:  [97-101] 100  (04/14 0510) Resp:  [19-20] 20  (04/14 0510) BP: (122-137)/(69-77) 122/74 mmHg (04/14 0510) SpO2:  [93 %-97 %] 96 % (04/14 0510) Weight:  [199 lb 8.3 oz (90.5 kg)] 199 lb 8.3 oz (90.5 kg) (04/14 0510)   Intake/Output from previous day: 04/13 0701 - 04/14 0700 In: 4293.1 [P.O.:120; IV Piggyback:12; TPN:4161.1] Out: 800 [Urine:800] Intake/Output this shift:     General appearance: alert, no distress and Appears a little depressed GI: still a little distended, but soft and not tender. BS+  Incision: healing well  Lab Results:   Basename 04/04/12 0446  WBC 9.9  HGB 10.3*  HCT 30.4*  PLT 300   BMET  Basename 04/06/12 0508 04/05/12 0501  NA 128* 130*  K 3.5 4.0  CL 96 97  CO2 26 25  GLUCOSE 107* 112*  BUN 23 21  CREATININE 0.94 0.97  CALCIUM 8.1* 8.4   PT/INR No results found for this basename: LABPROT:2,INR:2 in the last 72 hours ABG No results found for this basename: PHART:2,PCO2:2,PO2:2,HCO3:2 in the last 72 hours  MEDS, Scheduled    . bisacodyl  10 mg Rectal BID  . digoxin  0.125 mg Oral Daily  . enoxaparin  40 mg Subcutaneous Q24H  . feeding supplement  1 Container Oral TID BM  . furosemide  40 mg Oral BID  . magic mouthwash  10 mL Oral TID  . metoCLOPramide (REGLAN) injection  5 mg Intravenous Q6H  . metoprolol tartrate  100 mg Oral BID  . pantoprazole (PROTONIX) IV  40 mg Intravenous Q24H  . simethicone  80 mg Oral QID  . DISCONTD: furosemide  20 mg Intravenous Once  . DISCONTD: HYDROmorphone PCA 0.3 mg/mL   Intravenous Q4H    Studies/Results: Dg Abd 1 View  04/05/2012  *RADIOLOGY REPORT*  Clinical Data: Abdominal pain.   History of duodenal stenosis and gastrojejunostomy.  ABDOMEN - 1 VIEW  Comparison: 04/04/2012 upper GI and 04/03/2029 radiographs  Findings: Oral contrast in the colon is noted. No dilated bowel loops are present. The gas filled colon is decreased in caliber. Recent abdominal surgical changes are again noted.  IMPRESSION: Oral contrast within the colon.  Otherwise unremarkable bowel gas pattern.  Original Report Authenticated By: Rosendo Gros, M.D.   Varney Biles Kayleen Memos W/water Sol Cm  04/04/2012  *RADIOLOGY REPORT*  Clinical Data:  71 year old male with stage IV colon cancer status post gastrojejunostomy for treatment of duodenal obstruction.  UPPER GI SERIES WITH KUB  Technique:  Routine upper GI series was performed with 60 ml Omnipaque-300.  Fluoroscopy Time: 1.3 minutes  Comparison:  Abdominal films 04/03/2012 and earlier.  Preoperative CT abdomen pelvis 03/21/2012.  Findings: Preoperative scout view of the abdomen demonstrates chronic gaseous distention of the transverse colon.  There is contrast in the left colon and proximal sigmoid.  Small gas filled viscus in the epigastric region may represent the distal stomach. Midline skin staples.  The patient was given water soluble contrast to drink and he tolerated this well and without difficulty.  Contrast flowed into the stomach and promptly through the gastrojejunostomy.  Small bowel contrast then promptly transited through multiple nondilated proximal  small bowel loops. No abnormal accumulation or leakage of contrast identified.  IMPRESSION: Status post gastrojejunostomy with no adverse features on water soluble contrast swallow.  Original Report Authenticated By: Harley Hallmark, M.D.    Assessment: s/p Procedure(s): GASTROJEJUNOSTOMY HERNIA REPAIR VENTRAL ADULT Looks stable, not sure why he is not eating.   Plan: Will add Remeron which he was on preop and see if this helps appetite and depression   LOS: 15 days     Currie Paris, MD,  [facility], [place] [phone]   04/06/2012 9:22 AM

## 2012-04-06 NOTE — Progress Notes (Addendum)
PARENTERAL NUTRITION CONSULT NOTE - FOLLOW UP  Pharmacy Consult for TNA Indication: Duodenal Outlet Obstruction  Allergies  Allergen Reactions  . Diltiazem Hcl     REACTION: Severe lower extremity edema on generic cardizem  . Penicillins     Pt can tolerate cephalosporins/Pt tol Zosyn 11/01/11  . Plasma Human     Patient Measurements: Height: 5\' 10"  (177.8 cm) Weight: 199 lb 8.3 oz (90.5 kg) IBW/kg (Calculated) : 73  Adjusted Body Weight: 75 kg Recent weight loss: ~13 lb over 2 weeks  Vital Signs: Temp: 97.7 F (36.5 C) (04/14 0510) Temp src: Oral (04/14 0510) BP: 122/74 mmHg (04/14 0510) Pulse Rate: 100  (04/14 0510)  Intake/Output from previous day: 04/13 0701 - 04/14 0700 In: 4293.1 [P.O.:120; IV Piggyback:12; TPN:4161.1] Out: 800 [Urine:800]  Labs:  [facility] 04/04/12 0446  WBC 9.9  HGB 10.3*  HCT 30.4*  PLT 300  APTT --  INR --    Basename 04/06/12 0508 04/05/12 0501 04/04/12 0446  NA 128* 130* 129*  K 3.5 4.0 4.3  CL 96 97 99  CO2 26 25 23   GLUCOSE 107* 112* 115*  BUN 23 21 19   CREATININE 0.94 0.97 0.87  LABCREA -- -- --  CREAT24HRUR -- -- --  CALCIUM 8.1* 8.4 8.4  MG -- -- --  PHOS -- -- --  PROT -- 6.5 --  ALBUMIN -- 1.9* --  AST -- 88* --  ALT -- 129* --  ALKPHOS -- 301* --  BILITOT -- 0.3 --  BILIDIR -- -- --  IBILI -- -- --  PREALBUMIN -- -- --  TRIG -- -- --  CHOLHDL -- -- --  CHOL -- -- --   Estimated Creatinine Clearance: 80.4 ml/min (by C-G formula based on Cr of 0.94).   Insulin Requirements in the past 24 hours:  No sliding scale ordered currently, CBGs < 150s  Nutritional Goals:   Re-estimated recs per RD 4/4: Kcal: 2250-2500, Protein: 120-145g, Fluid: 2.2-2.5L  Clinimix E5/15 at 75 ml/hr + Lipids 20% =  approx 1760 kcals and 90g protein daily  Current Nutrition:   Diet: post-gastrectomy (4/12)  Clinimix E5/15 at 75 ml/hr + Lipids at 55ml/hr   [facility]F: NS [facility]  Assessment:  82 YOM with metastatic colon cancer, CT  scan 3/8 with dilated duodenal bulb, possible malignant obstruction, liver masses.  EGD 3/31, with duodenal outlet obstruction, attempt made for dilatation with balloon but lumen re-closed after balloon deflated. UGI 4/1, with complete obstruction in the post bulbar duodenum.  Patient is s/p exp lap and gastrojejunostomy 4/2.    TNA goal rate adjusted and advanced to goal 4/5, TNA taper started 4/10 per PA request.   Diet advanced to FLD 4/9am, low fiber on 4/10, then back to CLD 4/11, advanced to post-gastrectomy on 4/12. Per MD pt having BMs, taking very little PO.  Weight up 20 lbs this admission (volume overload w/edema), lasix added for diuresis 4/13 and weight is down to 90.5 kg (92.3 kg yesterday).  Reglan added 4/13 to assist with motility.  Remeron added 4/14 for appetite/depression.   Lytes: Na remains low (unable to change TNA content), corrected Ca wnl. Other lytes wnl.  LFTs: elevated prior to starting TNA, bumped on 4/8, increased further on 4/11, but improved slightly on 4/13.  (TNA contributing vs colon ca) TNA formula changed 4/12 to lower dextrose concentration to help with rising LFTs; recheck CMET Monday.  CBGs: In goal range <150  PO:  Resource breeze TID  ordered, but pt refusing almost all administrations.  IVF: kvo'd  PPx:  IV Protonix daily, simethicone  Plan:   Continue Clinimix E 5/15 at 75 ml/hr (approx 75% support).   Continue Lipid emulsion 20% at 2ml/hr daily  MVI and trace elements MWF only due to ongoing shortage  Continue CBGs q8h, no SSI ordered/required at this time  TNA lab panels on Mondays & Thursdays - follow up LFTs  Await MD orders to either d/c the TNA or go back to full support, based on tolerance of diet. If TNA isn't d/c'd soon, will need to come up with alternate plan due to rising LFTs (is tube feeding possible?)   Lynann Beaver PharmD, BCPS Pager [phone] 04/06/2012 10:08 AM

## 2012-04-07 MED ORDER — TRACE MINERALS CR-CU-MN-SE-ZN 10-1000-500-60 MCG/ML IV SOLN
INTRAVENOUS | Status: DC
  Filled 2012-04-07: qty 1200

## 2012-04-07 MED ORDER — FAT EMULSION 20 % IV EMUL
240.0000 mL | INTRAVENOUS | Status: DC
  Filled 2012-04-07: qty 250

## 2012-04-07 MED ORDER — ENSURE COMPLETE PO LIQD
237.0000 mL | Freq: Two times a day (BID) | ORAL | Status: DC
  Administered 2012-04-07 – 2012-04-08 (×2): 237 mL via ORAL

## 2012-04-07 NOTE — Progress Notes (Signed)
Subjective: Appetite seems to be increasing.  Objective: Vital signs in last 24 hours: Temp:  [98.1 F (36.7 C)-98.7 F (37.1 C)] 98.4 F (36.9 C) (04/15 1456) Pulse Rate:  [65-116] 65  (04/15 1456) Resp:  [18-24] 18  (04/15 1456) BP: (108-144)/(64-72) 108/64 mmHg (04/15 1456) SpO2:  [91 %-96 %] 96 % (04/15 1456) Weight:  [88.1 kg (194 lb 3.6 oz)] 88.1 kg (194 lb 3.6 oz) (04/15 0615) Weight change: -2.4 kg (-5 lb 4.7 oz) Last BM Date: 04/05/12  Intake/Output from previous day: 04/14 0701 - 04/15 0700 In: 1044.6 [P.O.:60; TPN:984.6] Out: 703 [Urine:700; Stool:3] Total I/O In: 240 [P.O.:240] Out: 750 [Urine:750]   Physical Exam: General: Alert, awake, oriented x3, in no acute distress.  HEENT: No bruits, no goiter.  Heart: Regular rate and rhythm, without murmurs, rubs, gallops.  Lungs: Clear to auscultation bilaterally.  Abdomen: Soft, nontender, nondistended, positive bowel sounds.  Extremities: No clubbing cyanosis or edema with positive pedal pulses.  Neuro: Grossly intact, nonfocal.    Lab Results: Basic Metabolic Panel:  Basename 04/06/12 0508 04/05/12 0501  NA 128* 130*  K 3.5 4.0  CL 96 97  CO2 26 25  GLUCOSE 107* 112*  BUN 23 21  CREATININE 0.94 0.97  CALCIUM 8.1* 8.4  MG -- --  PHOS -- --   Liver Function Tests:  Basename 04/05/12 0501  AST 88*  ALT 129*  ALKPHOS 301*  BILITOT 0.3  PROT 6.5  ALBUMIN 1.9*   CBG:  Basename 04/07/12 0746 04/06/12 1630 04/06/12 0745 04/05/12 2149 04/05/12 1759 04/05/12 0804  GLUCAP 110* 102* 120* 104* 117* 109*    Studies/Results: No results found.  Medications: Scheduled Meds:    . bisacodyl  10 mg Rectal BID  . digoxin  0.125 mg Oral Daily  . enoxaparin  40 mg Subcutaneous Q24H  . feeding supplement  237 mL Oral BID BM  . furosemide  40 mg Oral BID  . magic mouthwash  10 mL Oral TID  . metoCLOPramide (REGLAN) injection  5 mg Intravenous Q6H  . metoprolol tartrate  100 mg Oral BID  . mirtazapine   15 mg Oral QHS  . pantoprazole (PROTONIX) IV  40 mg Intravenous Q24H  . simethicone  80 mg Oral QID  . DISCONTD: feeding supplement  1 Container Oral TID BM   Continuous Infusions:    . sodium chloride 20 mL/hr at 04/02/12 2328  . TPN (CLINIMIX) +/- additives 75 mL/hr at 04/05/12 1808   And  . fat emulsion 240 mL (04/05/12 1809)  . TPN (CLINIMIX) +/- additives 75 mL/hr at 04/06/12 1722   And  . fat emulsion 240 mL (04/06/12 1722)  . TPN (CLINIMIX) +/- additives     And  . 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 PO tolerance improving, altho he has a poor appetite. Having BMs.  Surgery managing. Agree with starting remeron to see if it may stimulate appetite. Can also consider adding megace.  #2 Afib: No anticoagulation given ongoing medical issues. HR controlled, mostly in the 90s. Cardiology following,  #3 H/o systolic CHF: Cards has added some lasix given his increased volume overload. Does not appear to have overt CHF exacerbation.  #4 Transaminitis: improving. Suspect related  to colon ca +/- TNA.  #5 Dispo: Wean TNA as PO intake increasing. Plan for potential DC home in am. Arrange [facility] services.   LOS: 16 days   HERNANDEZ Love,Kyle Schaller [facility] Pager: [phone] 04/07/2012, 4:33 PM

## 2012-04-07 NOTE — Progress Notes (Signed)
PARENTERAL NUTRITION CONSULT NOTE - FOLLOW UP  Pharmacy Consult for TNA Indication: Duodenal Outlet Obstruction  Allergies  Allergen Reactions  . Diltiazem Hcl     REACTION: Severe lower extremity edema on generic cardizem  . Penicillins     Pt can tolerate cephalosporins/Pt tol Zosyn 11/01/11  . Plasma Human     Patient Measurements: Height: 5\' 10"  (177.8 cm) Weight: 194 lb 3.6 oz (88.1 kg) IBW/kg (Calculated) : 73  Adjusted Body Weight: 75 kg Recent weight loss: ~13 lb over 2 weeks  Vital Signs: Temp: 98.1 F (36.7 C) (04/15 0615) Temp src: Oral (04/15 0615) BP: 144/72 mmHg (04/15 0615) Pulse Rate: 71  (04/15 0615)  Intake/Output from previous day: 04/14 0701 - 04/15 0700 In: 1044.6 [P.O.:60; TPN:984.6] Out: 703 [Urine:700; Stool:3]  Labs: No results found for this basename: WBC:3,HGB:3,HCT:3,PLT:3,APTT:3,INR:3 in the last 72 hours  Basename 04/06/12 0508 04/05/12 0501  NA 128* 130*  K 3.5 4.0  CL 96 97  CO2 26 25  GLUCOSE 107* 112*  BUN 23 21  CREATININE 0.94 0.97  LABCREA -- --  CREAT24HRUR -- --  CALCIUM 8.1* 8.4  MG -- --  PHOS -- --  PROT -- 6.5  ALBUMIN -- 1.9*  AST -- 88*  ALT -- 129*  ALKPHOS -- 301*  BILITOT -- 0.3  BILIDIR -- --  IBILI -- --  PREALBUMIN -- --  TRIG -- --  CHOLHDL -- --  CHOL -- --   Estimated Creatinine Clearance: 79.4 ml/min (by C-G formula based on Cr of 0.94).   Insulin Requirements in the past 24 hours:  No sliding scale ordered currently, CBGs < 150s  Nutritional Goals:   Re-estimated recs per RD 4/4: Kcal: 2250-2500, Protein: 120-145g, Fluid: 2.2-2.5L  Clinimix E5/15 at 75 ml/hr + Lipids 20% =  approx 1760 kcals and 90g protein daily  Current Nutrition:   Diet: post-gastrectomy (4/12)  Clinimix E5/15 at 75 ml/hr + Lipids at 11ml/hr   IVF: NS at [facility]  Assessment:  58 YOM with metastatic colon cancer, CT scan 3/8 with dilated duodenal bulb, possible malignant obstruction, liver masses.  EGD 3/31, with  duodenal outlet obstruction, attempt made for dilatation with balloon but lumen re-closed after balloon deflated. UGI 4/1, with complete obstruction in the post bulbar duodenum.  Patient is s/p exp lap and gastrojejunostomy 4/2.    TNA goal rate adjusted and advanced to goal 4/5, TNA taper started 4/10 per PA request.   Diet advanced to post-gastrectomy diet 4/12, but patient taking very little PO and refusing Resource Breezes.  Weight up 20 lbs this admission (volume overload w/edema), lasix added for diuresis 4/13 and weight continues to come off, now 88.1 kg.  Reglan added 4/13 to assist with motility.  Remeron added 4/14 for appetite/depression.   Lytes: Na remains low (unable to change TNA content), corrected Ca wnl. Other lytes wnl.  LFTs: elevated prior to starting TNA, but has increased since (TNA contributing vs colon ca), AST/ALT improved slightly on E 5/15 formula (lower dextrose formula).  Alk phos still rising.   CBGs: In goal range <150  PO:  Resource breeze TID ordered, but pt refusing almost all administrations.  IVF: kvo'd  PPx:  IV Protonix daily, simethicone   Surgery's plan will be to d/c TNA once patient's oral intake is adequate.  Patient has low PO intake.  Would like to decrease rate of TNA today to help improve patient's appetite.  Decreasing rate should also help improve LFTs.  Plan:   Decrease Clinimix E 5/15 to 50 ml/hr (meeting 50% protein goals, 60% kcal goals).   Continue Lipid emulsion 20% at 19ml/hr daily  MVI and trace elements MWF only due to ongoing shortage  Continue CBGs q8h, no SSI ordered/required at this time  TNA lab panels on Mondays & Thursdays - follow up LFTs  Await MD orders to either d/c the TNA or go back to full support, based on tolerance of diet. If TNA isn't d/c'd soon, will need to come up with alternate plan due to rising LFTs (is tube feeding possible?)  Clance Boll, PharmD, BCPS Pager: [phone] 04/07/2012 10:23  AM

## 2012-04-07 NOTE — Progress Notes (Signed)
Patient ID: Kyle Love, male   DOB: 09-21-1941, 71 y.o.   MRN: [id number]  General Surgery - [facility] - Progress Note  POD# 13  Subjective: Patient up at bedside.  Mild pain.  Some po intake, mostly liquids.  Patient notes BM.  Objective: Vital signs in last 24 hours: Temp:  [97.3 F (36.3 C)-98.7 F (37.1 C)] 98.1 F (36.7 C) (04/15 0615) Pulse Rate:  [71-101] 71  (04/15 0615) Resp:  [20-24] 20  (04/15 0615) BP: (107-144)/(65-72) 144/72 mmHg (04/15 0615) SpO2:  [88 %-96 %] 96 % (04/15 0615) Weight:  [194 lb 3.6 oz (88.1 kg)] 194 lb 3.6 oz (88.1 kg) (04/15 0615) Last BM Date: 04/05/12  Intake/Output from previous day: 04/14 0701 - 04/15 0700 In: 1044.6 [P.O.:60; TPN:984.6] Out: 703 [Urine:700; Stool:3]  Exam: HEENT - clear, not icteric Neck - soft Chest - few wheezes bilaterally Cor - RRR, no murmur Abd - soft, protuberant; incision clear and dry; BS present; no tenderness Ext - no significant edema Neuro - grossly intact, no focal deficits  Lab Results:  No results found for this basename: WBC:2,HGB:2,HCT:2,PLT:2 in the last 72 hours   Basename 04/06/12 0508 04/05/12 0501  NA 128* 130*  K 3.5 4.0  CL 96 97  CO2 26 25  GLUCOSE 107* 112*  BUN 23 21  CREATININE 0.94 0.97  CALCIUM 8.1* 8.4    Studies/Results: Dg Abd 1 View  04/05/2012  *RADIOLOGY REPORT*  Clinical Data: Abdominal pain.  History of duodenal stenosis and gastrojejunostomy.  ABDOMEN - 1 VIEW  Comparison: 04/04/2012 upper GI and 04/03/2029 radiographs  Findings: Oral contrast in the colon is noted. No dilated bowel loops are present. The gas filled colon is decreased in caliber. Recent abdominal surgical changes are again noted.  IMPRESSION: Oral contrast within the colon.  Otherwise unremarkable bowel gas pattern.  Original Report Authenticated By: Rosendo Gros, M.D.    Assessment / Plan: 1.  Status post gastrojejunostomy and hernia repair  - encourage po intake  - when  po calories adequate, discontinue TNA 2.  Metastatic colon Ca  - chemo Rx per Dr. Truett Perna 3.  Disposition  - per medical service  Velora Heckler, MD, [facility] Office: [phone]  04/07/2012

## 2012-04-07 NOTE — Progress Notes (Signed)
Nutrition Follow-up  Spoke with patient and wife. Pt with no appetite.  Just started back on Remeron.  Complaints of taste alterations. Dislikes Resource and drinking Ensure.   Diet Order:  Post Gastrectomy, Resource Breeze tid.  Very poor intake.  Meds: Scheduled Meds:   . bisacodyl  10 mg Rectal BID  . digoxin  0.125 mg Oral Daily  . enoxaparin  40 mg Subcutaneous Q24H  . feeding supplement  1 Container Oral TID BM  . furosemide  40 mg Oral BID  . magic mouthwash  10 mL Oral TID  . metoCLOPramide (REGLAN) injection  5 mg Intravenous Q6H  . metoprolol tartrate  100 mg Oral BID  . mirtazapine  15 mg Oral QHS  . pantoprazole (PROTONIX) IV  40 mg Intravenous Q24H  . simethicone  80 mg Oral QID   Continuous Infusions:   . sodium chloride 20 mL/hr at 04/02/12 2328  . TPN (CLINIMIX) +/- additives 75 mL/hr at 04/05/12 1808   And  . fat emulsion 240 mL (04/05/12 1809)  . TPN (CLINIMIX) +/- additives 75 mL/hr at 04/06/12 1722   And  . fat emulsion 240 mL (04/06/12 1722)  . TPN (CLINIMIX) +/- additives     And  . fat emulsion     PRN Meds:.HYDROcodone-acetaminophen, HYDROmorphone (DILAUDID) injection, ondansetron, simethicone, sodium chloride  Labs:  CMP     Component Value Date/Time   NA 128* 04/06/2012 0508   K 3.5 04/06/2012 0508   CL 96 04/06/2012 0508   CO2 26 04/06/2012 0508   GLUCOSE 107* 04/06/2012 0508   BUN 23 04/06/2012 0508   CREATININE 0.94 04/06/2012 0508   CALCIUM 8.1* 04/06/2012 0508   PROT 6.5 04/05/2012 0501   ALBUMIN 1.9* 04/05/2012 0501   AST 88* 04/05/2012 0501   ALT 129* 04/05/2012 0501   ALKPHOS 301* 04/05/2012 0501   BILITOT 0.3 04/05/2012 0501   GFRNONAA 82* 04/06/2012 0508   GFRAA >90 04/06/2012 0508     Intake/Output Summary (Last 24 hours) at 04/07/12 1350 Last data filed at 04/07/12 0900  Gross per 24 hour  Intake 1134.59 ml  Output    877 ml  Net 257.59 ml    Weight Status:  Weight increased from 188# Wt Readings from Last 3 Encounters:    04/07/12 194 lb 3.6 oz (88.1 kg)  04/07/12 194 lb 3.6 oz (88.1 kg)  04/07/12 194 lb 3.6 oz (88.1 kg)   Re-estimated needs:  2250-2400 kcals, 110-130 grams protein  Nutrition Dx:  Altered GI function met New Dx:  Inadequate oral intake r/t poor appetite AEB verbalized very poor po.  Goal:  TPN to meet >90% estimated needs met and tpn now d/ced. New Goal:  Meet >90% estimated needs with po meals and supplements  Intervention:   1.  Add bid snacks 2. D/c resource breeze and add Ensure bid 3.  Encouraged po and provide preference. 4.  Continue remeron  Monitor:  Intake, labs, weight   Jeoffrey Massed Pager #:  [phone]

## 2012-04-07 NOTE — Progress Notes (Signed)
Physical Therapy Treatment Patient Details Name: Kyle Love MRN: [id number] DOB: 15-Feb-1941 Today's Date: 04/07/2012  PT Assessment/Plan  PT - Assessment/Plan Comments on Treatment Session: Pt continues to desat with ambulation.  Ambulated on 2L O2 with O2 sats dropping to 85-86% with 25' of amb.  Pt given max cues to take breaks and ensue pursed lip breathing.  O2 sats never got above 89% on 2 L of O2. MD notified and states that he will need O2 for home use.  PT Plan: Discharge plan remains appropriate;Frequency remains appropriate PT Frequency: Min 3X/week Follow Up Recommendations: Home health PT Equipment Recommended: None recommended by PT PT Goals  Acute Rehab PT Goals PT Goal Formulation: With patient Time For Goal Achievement: 2 weeks Pt will go Supine/Side to Sit: with modified independence PT Goal: Supine/Side to Sit - Progress: Progressing toward goal Pt will go Sit to Stand: with modified independence PT Goal: Sit to Stand - Progress: Progressing toward goal Pt will Ambulate: >150 feet;with modified independence;with least restrictive assistive device PT Goal: Ambulate - Progress: Progressing toward goal  PT Treatment Precautions/Restrictions  Restrictions Weight Bearing Restrictions: No Mobility (including Balance) Bed Mobility Bed Mobility: Yes Supine to Sit: 5: Supervision Supine to Sit Details (indicate cue type and reason): Supervision for safety with assist for lines. Transfers Transfers: Yes Sit to Stand: 5: Supervision;From elevated surface;With upper extremity assist;From bed Sit to Stand Details (indicate cue type and reason): Supervision for safety due to pt with slight impulsivity.  Stand to Sit: 5: Supervision;With upper extremity assist;To bed Stand to Sit Details: Supervision for safety with wife assisting lines.  Ambulation/Gait Ambulation/Gait: Yes Ambulation/Gait Assistance: 5: Supervision Ambulation/Gait Assistance Details (indicate cue  type and reason): Pt continues to use IV pole for support.  Pt does display SOB, however takes standing rest breaks as needed.  Continues to have decreased sats while ambulating.  see assessment for details.  Ambulation Distance (Feet): 200 Feet Assistive device: None Gait Pattern: Step-through pattern;Decreased stride length Gait velocity: decreased    Exercise    End of Session PT - End of Session Activity Tolerance: Patient limited by fatigue Patient left: in bed;with call bell in reach;with family/visitor present Nurse Communication: Other (comment) Sherron Monday with MD about O2 sats) General Behavior During Session: [facility] for tasks performed Cognition: [facility] for tasks performed  Page, Meribeth Mattes 04/07/2012, 3:45 PM

## 2012-04-07 NOTE — Progress Notes (Signed)
PROGRESS NOTE  Subjective:   Kyle Love is a 71 yo with metastatic colon cancer.  We are following for tachycardia / A-Fib with RVR.  Had surgery for bowel obstruction.    Objective:    Vital Signs:   Temp:  [97.3 F (36.3 C)-98.7 F (37.1 C)] 98.1 F (36.7 C) (04/15 0615) Pulse Rate:  [71-101] 71  (04/15 0615) Resp:  [20-24] 20  (04/15 0615) BP: (107-144)/(65-72) 144/72 mmHg (04/15 0615) SpO2:  [88 %-96 %] 96 % (04/15 0615) Weight:  [194 lb 3.6 oz (88.1 kg)] 194 lb 3.6 oz (88.1 kg) (04/15 0615)  Last BM Date: 04/05/12   24-hour weight change: Weight change: -5 lb 4.7 oz (-2.4 kg)  Weight trends: Filed Weights   04/05/12 0455 04/06/12 0510 04/07/12 0615  Weight: 203 lb 7.8 oz (92.3 kg) 199 lb 8.3 oz (90.5 kg) 194 lb 3.6 oz (88.1 kg)    Intake/Output:  04/14 0701 - 04/15 0700 In: 1044.6 [P.O.:60; TPN:984.6] Out: 703 [Urine:700; Stool:3]     Physical Exam: BP 144/72  Pulse 71  Temp(Src) 98.1 F (36.7 C) (Oral)  Resp 20  Ht 5\' 10"  (1.778 m)  Wt 194 lb 3.6 oz (88.1 kg)  BMI 27.87 kg/m2  SpO2 96%  General: Vital signs reviewed and noted. Well-developed, well-nourished, in no acute distress; alert, appropriate and cooperative throughout examination.  Head: Normocephalic, atraumatic.  Eyes: conjunctivae/corneas clear. PERRL, EOM's intact. Fundi benign.  Throat: Oropharynx nonerythematous, no exudate appreciated.   Neck: Supple. Normal carotids. No JVD  Lungs:  Clear bilaterally to auscultation without wheezes, rales, or rhonchi. Breathing is unlabored.  Heart: Irregularly irregular  Abdomen:  Soft, non-tender, non-distended with normoactive bowel sounds. No hepatomegaly. No rebound/guarding. No abdominal masses.  Extremities: No edema.  Distal pedal pulses are 2+ and equal bilaterally.  Neurologic: A&O X3, CN II - XII are grossly intact. Motor strength is 5/5 in the all 4 extremities.  Psych: Responds to questions appropriately with normal affect.     Labs: BMET:  Basename 04/06/12 0508 04/05/12 0501  NA 128* 130*  K 3.5 4.0  CL 96 97  CO2 26 25  GLUCOSE 107* 112*  BUN 23 21  CREATININE 0.94 0.97  CALCIUM 8.1* 8.4  MG -- --  PHOS -- --    Liver function tests:  Basename 04/05/12 0501  AST 88*  ALT 129*  ALKPHOS 301*  BILITOT 0.3  PROT 6.5  ALBUMIN 1.9*   No results found for this basename: LIPASE:2,AMYLASE:2 in the last 72 hours  CBC: No results found for this basename: WBC:2,NEUTROABS:2,HGB:2,HCT:2,MCV:2,PLT:2 in the last 72 hours  Cardiac Enzymes: No results found for this basename: CKTOTAL:4,CKMB:4,TROPONINI:4 in the last 72 hours  Coagulation Studies: No results found for this basename: LABPROT:5,INR:5 in the last 72 hours  Other:    Tele:  A-Fib with RVR  Medications:    Infusions:    . sodium chloride 20 mL/hr at 04/02/12 2328  . TPN (CLINIMIX) +/- additives 75 mL/hr at 04/05/12 1808   And  . fat emulsion 240 mL (04/05/12 1809)  . TPN (CLINIMIX) +/- additives 75 mL/hr at 04/06/12 1722   And  . fat emulsion 240 mL (04/06/12 1722)    Scheduled Medications:    . bisacodyl  10 mg Rectal BID  . digoxin  0.125 mg Oral Daily  . enoxaparin  40 mg Subcutaneous Q24H  . feeding supplement  1 Container Oral TID BM  . furosemide  40 mg Oral BID  .  magic mouthwash  10 mL Oral TID  . metoCLOPramide (REGLAN) injection  5 mg Intravenous Q6H  . metoprolol tartrate  100 mg Oral BID  . mirtazapine  15 mg Oral QHS  . pantoprazole (PROTONIX) IV  40 mg Intravenous Q24H  . simethicone  80 mg Oral QID    Assessment/ Plan:    1. A-Fib:  Rate is still high.  Continue current meds (metoprolol and digoxin)  Disposition: home when OK with medical / surgical team Length of Stay: 16  Vesta Mixer, Montez Hageman., MD, [facility] 04/07/2012, 7:51 AM

## 2012-04-07 NOTE — Discharge Instructions (Signed)
[facility]      [facility] [phone]  OPEN ABDOMINAL SURGERY: POST OP INSTRUCTIONS  Always review your discharge instruction sheet given to you by the facility where your surgery was performed.  IF YOU HAVE DISABILITY OR FAMILY LEAVE FORMS, YOU MUST BRING THEM TO THE OFFICE FOR PROCESSING.  PLEASE DO NOT GIVE THEM TO YOUR DOCTOR.  1. A prescription for pain medication may be given to you upon discharge.  Take your pain medication as prescribed, if needed.  If narcotic pain medicine is not needed, then you may take acetaminophen (Tylenol) or ibuprofen (Advil) as needed. 2. Take your usually prescribed medications unless otherwise directed. 3. If you need a refill on your pain medication, please contact your pharmacy. They will contact our office to request authorization.  Prescriptions will not be filled after 5pm or on week-ends. 4. You should follow a light diet the first few days after arrival home, such as soup and crackers, pudding, etc.unless your doctor has advised otherwise. A high-fiber, low fat diet can be resumed as tolerated.   Be sure to include lots of fluids daily. Most patients will experience some swelling and bruising on the chest and neck area.  Ice packs will help.  Swelling and bruising can take several days to resolve 5. Most patients will experience some swelling and bruising in the area of the incision. Ice pack will help. Swelling and bruising can take several days to resolve..  6. It is common to experience some constipation if taking pain medication after surgery.  Increasing fluid intake and taking a stool softener will usually help or prevent this problem from occurring.  A mild laxative (Milk of Magnesia or Miralax) should be taken according to package directions if there are no bowel movements after 48 hours. 7.  You may have steri-strips (small skin tapes) in place directly over the incision.  These strips should be left on the skin for 7-10 days.  If your  surgeon used skin glue on the incision, you may shower in 24 hours.  The glue will flake off over the next 2-3 weeks.  Any sutures or staples will be removed at the office during your follow-up visit. You may find that a light gauze bandage over your incision may keep your staples from being rubbed or pulled. You may shower and replace the bandage daily. 8. ACTIVITIES:  You may resume regular (light) daily activities beginning the next day--such as daily self-care, walking, climbing stairs--gradually increasing activities as tolerated.  You may have sexual intercourse when it is comfortable.  Refrain from any heavy lifting or straining until approved by your doctor. a. You may drive when you no longer are taking prescription pain medication, you can comfortably wear a seatbelt, and you can safely maneuver your car and apply brakes b. Return to Work: ___________________________________ 9. You should see your doctor in the office for a follow-up appointment approximately two weeks after your surgery.  Make sure that you call for this appointment within a day or two after you arrive home to insure a convenient appointment time. OTHER INSTRUCTIONS:  _____________________________________________________________ _____________________________________________________________  WHEN TO CALL YOUR DOCTOR: 1. Fever over 101.0 2. Inability to urinate 3. Nausea and/or vomiting 4. Extreme swelling or bruising 5. Continued bleeding from incision. 6. Increased pain, redness, or drainage from the incision. 7. Difficulty swallowing or breathing 8. Muscle cramping or spasms. 9. Numbness or tingling in hands or feet or around lips.  The clinic staff is available to   answer your questions during regular business hours.  Please don't hesitate to call and ask to speak to one of the nurses if you have concerns.  For further questions, please visit [email or web address]   

## 2012-04-08 ENCOUNTER — Telehealth (INDEPENDENT_AMBULATORY_CARE_PROVIDER_SITE_OTHER): Payer: Self-pay | Admitting: General Surgery

## 2012-04-08 ENCOUNTER — Telehealth: Payer: Self-pay | Admitting: *Deleted

## 2012-04-08 LAB — GLUCOSE, CAPILLARY: Glucose-Capillary: 79 mg/dL (ref 70–99)

## 2012-04-08 MED ORDER — MEGESTROL ACETATE 625 MG/5ML PO SUSP: 625.0000 mg | Freq: Every day | ORAL | Status: AC

## 2012-04-08 MED ORDER — FUROSEMIDE 40 MG PO TABS: 40.0000 mg | ORAL_TABLET | Freq: Every day | ORAL | Status: AC

## 2012-04-08 MED ORDER — MAGIC MOUTHWASH: 10.0000 mL | Freq: Three times a day (TID) | ORAL | Status: AC

## 2012-04-08 MED ORDER — METOPROLOL TARTRATE 100 MG PO TABS: 100.0000 mg | ORAL_TABLET | Freq: Two times a day (BID) | ORAL | Status: DC

## 2012-04-08 MED ORDER — HYDROCODONE-ACETAMINOPHEN 10-325 MG PO TABS: 1.0000 | ORAL_TABLET | Freq: Four times a day (QID) | ORAL | Status: DC | PRN

## 2012-04-08 MED ORDER — DIGOXIN 125 MCG PO TABS: 0.1250 mg | ORAL_TABLET | Freq: Every day | ORAL | Status: AC

## 2012-04-08 MED ORDER — HEPARIN SOD (PORK) LOCK FLUSH 100 UNIT/ML IV SOLN
500.0000 [IU] | INTRAVENOUS | Status: AC | PRN
  Administered 2012-04-08: 500 [IU]

## 2012-04-08 NOTE — Progress Notes (Signed)
Patient discharge to home with home O2, alert and oriented no complaints of any discomfort. Wife at bedside, discharge instructions , follow up appointment discussed and given to the wife, verbalized understanding. Port de accessed by IV nurse.

## 2012-04-08 NOTE — Discharge Summary (Signed)
General Surgery [facility] - Attending  Agree with discharge.  Discussed today with Dr. Truett Perna.  Velora Heckler, MD, [facility] Office: [phone]

## 2012-04-08 NOTE — Discharge Summary (Signed)
Physician Discharge Summary  Patient ID: Kyle Love MRN: [id number] DOB/AGE: 73-Feb-1942 73 y.o.  Admit date: 03/22/2012 Discharge date: 04/08/2012  Primary Care Physician:  Colette Ribas, MD, MD   Discharge Diagnoses:    Principal Problem:  *Duodenal stenosis Active Problems:  COLON CANCER  Atrial fibrillation  ANEMIA-IRON DEFICIENCY  Hyponatremia  Bowel obstruction  Transaminitis    Medication List  As of 04/08/2012  9:32 AM   STOP taking these medications         aspirin 81 MG tablet      polyethylene glycol packet      traMADol 50 MG tablet      VITAMIN B 12 PO         TAKE these medications         ALPRAZolam 0.5 MG tablet   Commonly known as: XANAX   TAKE 1/2 TO ONE TAB BY MOUTH EVERY 12 HOURS PRN ANXIETY. MAY ALSO USE FOR SLEEP PRN.      digoxin 0.125 MG tablet   Commonly known as: LANOXIN   Take 1 tablet (0.125 mg total) by mouth daily.      docusate sodium 100 MG capsule   Commonly known as: COLACE   Take 100 mg by mouth 2 (two) times daily.      folic acid 400 MCG tablet   Commonly known as: FOLVITE   Take 400 mcg by mouth daily.      furosemide 40 MG tablet   Commonly known as: LASIX   Take 1 tablet (40 mg total) by mouth daily.      HYDROcodone-acetaminophen 10-325 MG per tablet   Commonly known as: NORCO   Take 1 tablet by mouth every 6 (six) hours as needed for pain.      lidocaine-prilocaine cream   Commonly known as: EMLA   Ad lib.      magic mouthwash Soln   Take 10 mLs by mouth 3 (three) times daily.      megestrol 625 MG/5ML suspension   Commonly known as: MEGACE ES   Take 5 mLs (625 mg total) by mouth daily.      metoprolol 100 MG tablet   Commonly known as: LOPRESSOR   Take 1 tablet (100 mg total) by mouth 2 (two) times daily.      mirtazapine 15 MG tablet   Commonly known as: REMERON   Take 1 tablet (15 mg total) by mouth at bedtime.      ondansetron 8 MG tablet   Commonly known as: ZOFRAN   Take by  mouth every 8 (eight) hours as needed. For nausea      pantoprazole 40 MG tablet   Commonly known as: PROTONIX   Take 1 tablet (40 mg total) by mouth daily.      prochlorperazine 10 MG tablet   Commonly known as: COMPAZINE   Take 10 mg by mouth every 6 (six) hours as needed. Nausea             Disposition and Follow-up:  Will be discharged home today in stable and improved condition. Will need to followup with PCP in 3 weeks, with surgery in 1 week and with Dr. Truett Perna in 1 week.  Consults:  general surgery Dr. Derrell Lolling, Oncology Dr. Truett Perna   Significant Diagnostic Studies:  No results found.  Brief H and P: For complete details please refer to admission H and P, but in brief this is a 71 y/o white man with a history of metastatic colon  cancer followed by Dr. Myrle Sheng, who for the past 10 days has been having sharp abdominal pain about 10 minutes after eating. Only has emesis if he "eats enough". Has not been able to tolerate even liquids. Has lost 10 pounds in same amount of days. Went to the cancer center for an unscheduled visit on 3/28, found to be very dehydrated and was given a liter of fluid and was scheduled for a CT scan on 3/29 that showed that at the junction of the first and second part of the duodenum, there is narrowing of the duodenum with endoluminal nodular density and circumferential narrowing. The appearance is compatible with stricture which may be malignant. His pain worsened overnight, so he came to the ED for further evaluation and management. We are asked to admit him.     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 PO tolerance improving, altho he has a poor appetite. Having BMs. Surgery has been following. Has been started on emeron to see  if it may stimulate appetite. Will also add megace.   #2 Afib: No anticoagulation given ongoing medical issues. HR controlled, mostly in the 90s. Cardiology has been following. Will need to see Dr. [name] as an outpatient.  #3 H/o systolic CHF: Cards has added some lasix given his increased volume overload. Does not appear to have overt CHF exacerbation.   #4 Transaminitis: improving. Suspect related to colon ca +/- TNA.  Will discharge home today.     Time spent on Discharge: Greater than 30 minutes.  SignedChaya Jan [facility] Pager: [phone] 04/08/2012, 9:32 AM

## 2012-04-08 NOTE — Telephone Encounter (Signed)
Wife called requesting follow up appointment with Dr. Truett Perna in next 1 week per order of hospitalist. Just discharged from hospital today.

## 2012-04-08 NOTE — Discharge Summary (Signed)
14 Days Post-Op  Subjective: Has allot of lower extremity swelling, poor appetite, just doesn't want to eat. +BM's, Medicine is planning d/c home today,.  Objective: Vital signs in last 24 hours: Temp:  [98.4 F (36.9 C)-98.5 F (36.9 C)] 98.5 F (36.9 C) (04/16 0559) Pulse Rate:  [65-109] 102  (04/16 0909) Resp:  [18] 18  (04/16 0559) BP: (108-128)/(56-72) 117/56 mmHg (04/16 0559) SpO2:  [92 %-96 %] 94 % (04/16 0559) Weight:  [90.1 kg (198 lb 10.2 oz)] 90.1 kg (198 lb 10.2 oz) (04/16 0559) Last BM Date: 04/05/12 Afebrile, VSS, O2 sats down on room air, No labs last NA 128, I/O shows on 480 PO intake Intake/Output from previous day: 04/15 0701 - 04/16 0700 In: 489 [P.O.:480; IV Piggyback:9] Out: 1450 [Urine:1450] Intake/Output this shift:    General appearance: alert, cooperative and no distress Resp: clear to auscultation bilaterally GI: soft, non-tender; bowel sounds normal; no masses,  no organomegaly still pretty distended,. Extremities: edema  both lower legs  Lab Results:  No results found for this basename: WBC:2,HGB:2,HCT:2,PLT:2 in the last 72 hours  BMET  [facility] 04/06/12 0508  NA 128*  K 3.5  CL 96  CO2 26  GLUCOSE 107*  BUN 23  CREATININE 0.94  CALCIUM 8.1*   PT/INR No results found for this basename: LABPROT:2,INR:2 in the last 72 hours   Lab 04/05/12 0501 04/03/12 0455  AST 88* 171*  ALT 129* 149*  ALKPHOS 301* 283*  BILITOT 0.3 0.5  PROT 6.5 6.1  ALBUMIN 1.9* 1.8*     Lipase  No results found for this basename: lipase     Studies/Results: No results found.  Medications:    . bisacodyl  10 mg Rectal BID  . digoxin  0.125 mg Oral Daily  . enoxaparin  40 mg Subcutaneous Q24H  . feeding supplement  237 mL Oral BID BM  . furosemide  40 mg Oral BID  . magic mouthwash  10 mL Oral TID  . metoCLOPramide (REGLAN) injection  5 mg Intravenous Q6H  . metoprolol tartrate  100 mg Oral BID  . mirtazapine  15 mg Oral QHS  . pantoprazole  (PROTONIX) IV  40 mg Intravenous Q24H  . simethicone  80 mg Oral QID  . DISCONTD: feeding supplement  1 Container Oral TID BM    Assessment/Plan Status post gastrojejunostomy and hernia repair  - encourage po intake  - when po calories adequate, discontinue TNA  2. Metastatic colon Ca  - chemo Rx per Dr. Alcide Evener   Plan:  I suggested multiple small meals, high protien/calories, follow up 2 weeks with DR. Derrell Lolling and 1 week with oncology.     LOS: 17 days    Walda Hertzog 04/08/2012

## 2012-04-08 NOTE — Progress Notes (Signed)
Per Attending MD, patient is for discharge home today. Talked to patient with spouse present about DCP/ HHC needs; Patient chose [facility] for HHRN/PT and also ordered home 02; Abelino Derrick RN, BSN, [place].

## 2012-04-08 NOTE — Progress Notes (Signed)
O2Sats  On room air at rest 89%

## 2012-04-08 NOTE — Progress Notes (Signed)
O2 sats on room air at rest 87%

## 2012-04-08 NOTE — Progress Notes (Signed)
    I note plans for pt to go home later today.  Pt is currently sleeping.  Follow up with Dr. [name].  Vesta Mixer, Montez Hageman., MD, [facility] 04/08/2012, 7:12 AM

## 2012-04-09 ENCOUNTER — Other Ambulatory Visit: Payer: Self-pay | Admitting: *Deleted

## 2012-04-09 ENCOUNTER — Encounter (HOSPITAL_COMMUNITY): Payer: Self-pay | Admitting: Emergency Medicine

## 2012-04-09 ENCOUNTER — Emergency Department (HOSPITAL_COMMUNITY): Payer: Medicare Other

## 2012-04-09 ENCOUNTER — Telehealth: Payer: Self-pay | Admitting: *Deleted

## 2012-04-09 ENCOUNTER — Inpatient Hospital Stay (HOSPITAL_COMMUNITY)
Admission: EM | Admit: 2012-04-09 | Discharge: 2012-04-17 | DRG: 180 | Disposition: A | Discharge status: Hospice | Payer: Medicare Other | Attending: Internal Medicine | Admitting: Internal Medicine

## 2012-04-09 ENCOUNTER — Other Ambulatory Visit: Payer: Self-pay

## 2012-04-09 ENCOUNTER — Telehealth: Payer: Self-pay | Admitting: Oncology

## 2012-04-09 DIAGNOSIS — C189 Malignant neoplasm of colon, unspecified: Secondary | ICD-10-CM | POA: Diagnosis present

## 2012-04-09 DIAGNOSIS — J189 Pneumonia, unspecified organism: Secondary | ICD-10-CM | POA: Diagnosis present

## 2012-04-09 DIAGNOSIS — C787 Secondary malignant neoplasm of liver and intrahepatic bile duct: Secondary | ICD-10-CM | POA: Diagnosis present

## 2012-04-09 DIAGNOSIS — C7800 Secondary malignant neoplasm of unspecified lung: Principal | ICD-10-CM | POA: Diagnosis present

## 2012-04-09 DIAGNOSIS — Z954 Presence of other heart-valve replacement: Secondary | ICD-10-CM

## 2012-04-09 DIAGNOSIS — R601 Generalized edema: Secondary | ICD-10-CM | POA: Diagnosis present

## 2012-04-09 DIAGNOSIS — J90 Pleural effusion, not elsewhere classified: Secondary | ICD-10-CM | POA: Diagnosis present

## 2012-04-09 DIAGNOSIS — Y95 Nosocomial condition: Secondary | ICD-10-CM

## 2012-04-09 DIAGNOSIS — N179 Acute kidney failure, unspecified: Secondary | ICD-10-CM | POA: Diagnosis present

## 2012-04-09 DIAGNOSIS — E8809 Other disorders of plasma-protein metabolism, not elsewhere classified: Secondary | ICD-10-CM | POA: Diagnosis present

## 2012-04-09 DIAGNOSIS — R600 Localized edema: Secondary | ICD-10-CM

## 2012-04-09 DIAGNOSIS — R52 Pain, unspecified: Secondary | ICD-10-CM

## 2012-04-09 DIAGNOSIS — R0902 Hypoxemia: Secondary | ICD-10-CM | POA: Diagnosis present

## 2012-04-09 DIAGNOSIS — R627 Adult failure to thrive: Secondary | ICD-10-CM | POA: Diagnosis present

## 2012-04-09 DIAGNOSIS — R7989 Other specified abnormal findings of blood chemistry: Secondary | ICD-10-CM | POA: Diagnosis present

## 2012-04-09 DIAGNOSIS — I4891 Unspecified atrial fibrillation: Secondary | ICD-10-CM | POA: Diagnosis present

## 2012-04-09 DIAGNOSIS — R609 Edema, unspecified: Secondary | ICD-10-CM | POA: Diagnosis present

## 2012-04-09 DIAGNOSIS — R0600 Dyspnea, unspecified: Secondary | ICD-10-CM | POA: Diagnosis present

## 2012-04-09 DIAGNOSIS — Z9889 Other specified postprocedural states: Secondary | ICD-10-CM

## 2012-04-09 DIAGNOSIS — R0602 Shortness of breath: Secondary | ICD-10-CM

## 2012-04-09 DIAGNOSIS — R180 Malignant ascites: Secondary | ICD-10-CM | POA: Diagnosis present

## 2012-04-09 DIAGNOSIS — C182 Malignant neoplasm of ascending colon: Secondary | ICD-10-CM

## 2012-04-09 DIAGNOSIS — M7989 Other specified soft tissue disorders: Secondary | ICD-10-CM

## 2012-04-09 DIAGNOSIS — C7951 Secondary malignant neoplasm of bone: Secondary | ICD-10-CM | POA: Diagnosis present

## 2012-04-09 DIAGNOSIS — C7952 Secondary malignant neoplasm of bone marrow: Secondary | ICD-10-CM | POA: Diagnosis present

## 2012-04-09 DIAGNOSIS — Z66 Do not resuscitate: Secondary | ICD-10-CM | POA: Diagnosis present

## 2012-04-09 HISTORY — DX: Acute kidney failure, unspecified: N17.9

## 2012-04-09 LAB — URINALYSIS, ROUTINE W REFLEX MICROSCOPIC
Hgb urine dipstick: NEGATIVE
Leukocytes, UA: NEGATIVE
Nitrite: NEGATIVE
Protein, ur: NEGATIVE mg/dL
Specific Gravity, Urine: 1.018 (ref 1.005–1.030)
Urobilinogen, UA: 1 mg/dL (ref 0.0–1.0)

## 2012-04-09 LAB — COMPREHENSIVE METABOLIC PANEL
ALT: 124 U/L — ABNORMAL HIGH (ref 0–53)
Albumin: 2 g/dL — ABNORMAL LOW (ref 3.5–5.2)
Alkaline Phosphatase: 460 U/L — ABNORMAL HIGH (ref 39–117)
BUN: 35 mg/dL — ABNORMAL HIGH (ref 6–23)
Chloride: 97 mEq/L (ref 96–112)
Glucose, Bld: 103 mg/dL — ABNORMAL HIGH (ref 70–99)
Potassium: 4.1 mEq/L (ref 3.5–5.1)
Sodium: 132 mEq/L — ABNORMAL LOW (ref 135–145)
Total Bilirubin: 0.6 mg/dL (ref 0.3–1.2)
Total Protein: 6.8 g/dL (ref 6.0–8.3)

## 2012-04-09 LAB — CBC
Hemoglobin: 10.8 g/dL — ABNORMAL LOW (ref 13.0–17.0)
Platelets: 400 10*3/uL (ref 150–400)
RBC: 3.3 MIL/uL — ABNORMAL LOW (ref 4.22–5.81)
WBC: 14.5 10*3/uL — ABNORMAL HIGH (ref 4.0–10.5)

## 2012-04-09 LAB — DIGOXIN LEVEL: Digoxin Level: 0.8 ng/mL (ref 0.8–2.0)

## 2012-04-09 LAB — PROTIME-INR
INR: 1.17 (ref 0.00–1.49)
Prothrombin Time: 15.1 seconds (ref 11.6–15.2)

## 2012-04-09 LAB — DIFFERENTIAL
Lymphs Abs: 0.8 10*3/uL (ref 0.7–4.0)
Monocytes Relative: 6 % (ref 3–12)
Neutro Abs: 12.9 10*3/uL — ABNORMAL HIGH (ref 1.7–7.7)
Neutrophils Relative %: 89 % — ABNORMAL HIGH (ref 43–77)

## 2012-04-09 LAB — PRO B NATRIURETIC PEPTIDE: Pro B Natriuretic peptide (BNP): 1549 pg/mL — ABNORMAL HIGH (ref 0–125)

## 2012-04-09 MED ORDER — FOLIC ACID 0.5 MG HALF TAB
400.0000 ug | ORAL_TABLET | Freq: Every day | ORAL | Status: DC
  Administered 2012-04-09 – 2012-04-17 (×9): 0.5 mg via ORAL
  Filled 2012-04-09 (×9): qty 1

## 2012-04-09 MED ORDER — HYDROCODONE-ACETAMINOPHEN 10-325 MG PO TABS
1.0000 | ORAL_TABLET | Freq: Four times a day (QID) | ORAL | Status: DC | PRN
  Administered 2012-04-10 – 2012-04-15 (×18): 1 via ORAL
  Filled 2012-04-09 (×18): qty 1

## 2012-04-09 MED ORDER — MEGESTROL ACETATE 625 MG/5ML PO SUSP: 625.0000 mg | Freq: Every day | ORAL | Status: DC

## 2012-04-09 MED ORDER — ONDANSETRON HCL 4 MG PO TABS
8.0000 mg | ORAL_TABLET | Freq: Three times a day (TID) | ORAL | Status: DC | PRN
  Administered 2012-04-14: 8 mg via ORAL
  Filled 2012-04-09: qty 2

## 2012-04-09 MED ORDER — MIRTAZAPINE 15 MG PO TABS
15.0000 mg | ORAL_TABLET | Freq: Every day | ORAL | Status: DC
  Administered 2012-04-10 – 2012-04-15 (×7): 15 mg via ORAL
  Filled 2012-04-09 (×9): qty 1

## 2012-04-09 MED ORDER — PANTOPRAZOLE SODIUM 40 MG PO TBEC
40.0000 mg | DELAYED_RELEASE_TABLET | Freq: Every day | ORAL | Status: DC
  Administered 2012-04-09 – 2012-04-17 (×9): 40 mg via ORAL
  Filled 2012-04-09 (×9): qty 1

## 2012-04-09 MED ORDER — HYDROCODONE-ACETAMINOPHEN 5-325 MG PO TABS
ORAL_TABLET | ORAL | Status: AC
  Filled 2012-04-09: qty 1

## 2012-04-09 MED ORDER — SODIUM CHLORIDE 0.9 % IJ SOLN: 3.0000 mL | INTRAMUSCULAR | Status: DC | PRN

## 2012-04-09 MED ORDER — VANCOMYCIN HCL 1000 MG IV SOLR
750.0000 mg | Freq: Two times a day (BID) | INTRAVENOUS | Status: AC
  Administered 2012-04-09 – 2012-04-11 (×5): 750 mg via INTRAVENOUS
  Filled 2012-04-09 (×7): qty 750

## 2012-04-09 MED ORDER — FUROSEMIDE 10 MG/ML IJ SOLN
40.0000 mg | Freq: Two times a day (BID) | INTRAMUSCULAR | Status: DC
  Administered 2012-04-10 – 2012-04-13 (×7): 40 mg via INTRAVENOUS
  Filled 2012-04-09 (×10): qty 4

## 2012-04-09 MED ORDER — LEVOFLOXACIN IN D5W 750 MG/150ML IV SOLN
750.0000 mg | INTRAVENOUS | Status: AC
  Administered 2012-04-09 – 2012-04-10 (×2): 750 mg via INTRAVENOUS
  Filled 2012-04-09 (×3): qty 150

## 2012-04-09 MED ORDER — SODIUM CHLORIDE 0.9 % IJ SOLN: 3.0000 mL | Freq: Two times a day (BID) | INTRAMUSCULAR | Status: DC

## 2012-04-09 MED ORDER — SODIUM CHLORIDE 0.9 % IV SOLN
250.0000 mL | INTRAVENOUS | Status: DC | PRN
  Administered 2012-04-12: 250 mL via INTRAVENOUS
  Administered 2012-04-14: 1000 mL via INTRAVENOUS

## 2012-04-09 MED ORDER — MEGESTROL ACETATE 400 MG/10ML PO SUSP
800.0000 mg | Freq: Every day | ORAL | Status: DC
  Administered 2012-04-09 – 2012-04-17 (×9): 800 mg via ORAL
  Filled 2012-04-09 (×9): qty 20

## 2012-04-09 MED ORDER — FUROSEMIDE 10 MG/ML IJ SOLN
60.0000 mg | Freq: Once | INTRAMUSCULAR | Status: AC
  Administered 2012-04-09: 60 mg via INTRAVENOUS
  Filled 2012-04-09: qty 6

## 2012-04-09 MED ORDER — HYDROCODONE-ACETAMINOPHEN 5-325 MG PO TABS
ORAL_TABLET | ORAL | Status: AC
  Administered 2012-04-09: 22:00:00
  Filled 2012-04-09: qty 1

## 2012-04-09 MED ORDER — ALPRAZOLAM 0.5 MG PO TABS: 0.5000 mg | ORAL_TABLET | Freq: Three times a day (TID) | ORAL | Status: DC | PRN

## 2012-04-09 MED ORDER — ENSURE COMPLETE PO LIQD
237.0000 mL | Freq: Once | ORAL | Status: DC
  Filled 2012-04-09: qty 237

## 2012-04-09 MED ORDER — DEXTROSE 5 % IV SOLN
2.0000 g | Freq: Three times a day (TID) | INTRAVENOUS | Status: DC
  Administered 2012-04-09 – 2012-04-15 (×19): 2 g via INTRAVENOUS
  Filled 2012-04-09 (×21): qty 2

## 2012-04-09 MED ORDER — DIGOXIN 125 MCG PO TABS
0.1250 mg | ORAL_TABLET | Freq: Every day | ORAL | Status: DC
  Administered 2012-04-09 – 2012-04-17 (×9): 0.125 mg via ORAL
  Filled 2012-04-09 (×9): qty 1

## 2012-04-09 MED ORDER — MAGIC MOUTHWASH
10.0000 mL | Freq: Three times a day (TID) | ORAL | Status: DC
  Administered 2012-04-09 – 2012-04-17 (×22): 10 mL via ORAL
  Filled 2012-04-09 (×25): qty 10

## 2012-04-09 MED ORDER — ENOXAPARIN SODIUM 40 MG/0.4ML ~~LOC~~ SOLN
40.0000 mg | SUBCUTANEOUS | Status: DC
  Administered 2012-04-09 – 2012-04-15 (×7): 40 mg via SUBCUTANEOUS
  Filled 2012-04-09 (×8): qty 0.4

## 2012-04-09 MED ORDER — DOCUSATE SODIUM 100 MG PO CAPS
100.0000 mg | ORAL_CAPSULE | Freq: Two times a day (BID) | ORAL | Status: DC
  Administered 2012-04-09 – 2012-04-17 (×16): 100 mg via ORAL
  Filled 2012-04-09 (×17): qty 1

## 2012-04-09 MED ORDER — IOHEXOL 300 MG/ML  SOLN
100.0000 mL | Freq: Once | INTRAMUSCULAR | Status: AC | PRN
  Administered 2012-04-09: 100 mL via INTRAVENOUS

## 2012-04-09 MED ORDER — METOPROLOL TARTRATE 100 MG PO TABS
100.0000 mg | ORAL_TABLET | Freq: Two times a day (BID) | ORAL | Status: DC
  Administered 2012-04-09: 50 mg via ORAL
  Administered 2012-04-10 – 2012-04-17 (×14): 100 mg via ORAL
  Filled 2012-04-09 (×10): qty 1
  Filled 2012-04-09: qty 4
  Filled 2012-04-09 (×6): qty 1

## 2012-04-09 NOTE — ED Notes (Signed)
Pt wife stated that home health nurse came yesterday and stated that the pt needed to visit the hospital due to low oxygen levels and leg swelling.  Pt is in stage 4 of colon cancer.

## 2012-04-09 NOTE — ED Notes (Signed)
Pt noted to have pitting edema in bilateral lower ext. Pt has had decreased appetite. Pt wife states he becomes dyspnea with activity. Pt also has a distended and tender abdomin

## 2012-04-09 NOTE — Progress Notes (Signed)
ANTIBIOTIC CONSULT NOTE - INITIAL  Pharmacy Consult for Vancomycin/Aztreonam/Levofloxacin Indication: rule out pneumonia  Allergies  Allergen Reactions  . Diltiazem Hcl     REACTION: Severe lower extremity edema on generic cardizem  . Penicillins     Pt can tolerate cephalosporins/Pt tol Zosyn 11/01/11  . Plasma Human     Patient Measurements: Wt: 90kg Ht: 70in  Vital Signs: Temp: 97.8 F (36.6 C) (04/17 1434) Temp src: Oral (04/17 1434) BP: 130/59 mmHg (04/17 1617) Pulse Rate: 102  (04/17 1434) Intake/Output from previous day:   Intake/Output from this shift:    Labs:  Basename 04/09/12 1600  WBC 14.5*  HGB 10.8*  PLT 400  LABCREA --  CREATININE 1.15   CrCl = 58 ml/min (normalized)   Microbiology: Recent Results (from the past 720 hour(s))  SURGICAL PCR SCREEN     Status: Normal   Collection Time   03/23/12  8:05 AM      Component Value Range Status Comment   MRSA, PCR NEGATIVE  NEGATIVE  Final    Staphylococcus aureus NEGATIVE  NEGATIVE  Final   SURGICAL PCR SCREEN     Status: Normal   Collection Time   03/25/12 11:32 AM      Component Value Range Status Comment   MRSA, PCR NEGATIVE  NEGATIVE  Final    Staphylococcus aureus NEGATIVE  NEGATIVE  Final     Medical History: Past Medical History  Diagnosis Date  . History of ETOH abuse     quit 25 yrs ago, heavy etoh 5 yrs  . Colitis   . History of GI diverticular bleed 3/11  . Rectus sheath hematoma 3/10  . Status post Maze operation for atrial fibrillation   . Aortic aneurysm   . 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 S/P colonoscopy 05/03/10    mod diverticulosis sigmoid colon, internal hemorrhoids, suspected diverticular bleed  . Diverticulosis 07/23/2008    colonoscopy by Dr Jena Gauss, hyperplastic polyp  . IDA (iron deficiency anemia)     work-up including small bowel capsule study benign  . COPD (chronic obstructive pulmonary  disease)   . CAD (coronary artery disease)     s/p CABG  . S/P aortic valve replacement   . Cancer right colon T3N2M1  . Weight decrease     20-30 lb in month  . Pneumonia   . History of colon polyps   . Shortness of breath   . Arthritis     bilateral neck and shoulder pain  . Atrial fibrillation     Medications:  Scheduled:    . aztreonam  2 g Intravenous Q8H  . furosemide  60 mg Intravenous Once  . levofloxacin (LEVAQUIN) IV  750 mg Intravenous Q24H    Assessment: 70 yom with metastatic colon cancer, discharged from hospital yesterday after 17 days.  Beginning broad spectrum antibiotics with vancomycin, aztreonam & levaquin for suspected HAP with penicillin allergy.  Goal of Therapy:  Vancomycin trough level 15-20 mcg/ml  Plan:   Vancomycin 750mg  IV q12h x 8 days  Check trough at steady state  Continue Aztreonam 2gm IV q8h x 8 days  Continue Levaquin 750mg  IV q24h x 3 days  Follow renal function & cultures  Loralee Pacas, PharmD, BCPS Pager: [phone] 04/09/2012,7:45 PM

## 2012-04-09 NOTE — Telephone Encounter (Signed)
On call: hospitalist notifying our service of admission and requests Dr.Sherrill see, as family would like to discuss whether hospice referral is appropriate.

## 2012-04-09 NOTE — Progress Notes (Signed)
Bilateral lower extremity venous duplex completed.  Preliminary report is negative for DVT, SVT, or a Baker's cyst. 

## 2012-04-09 NOTE — Telephone Encounter (Signed)
Call from home care RN reporting pt has BLE edema. Dyspneic. O2 sats in the mid 80s on 2 liters. BP 124/60 Heart rate 96 and irregular. Kim reports pt is fidgety and anxious. Wife states he has been talking incoherently.  Reviewed with Dr. Truett Perna: Have pt go to ED for hypoxia. Selena Batten will instruct pt and wife.

## 2012-04-09 NOTE — Progress Notes (Signed)
Patient ID: Kyle Love, male   DOB: 04/12/1941, 71 y.o.   MRN: [id number] Patients wife Carney Bern called me at home.  Kyle Love is dying of metastatic colon cancer.  Recent duodenal  diversion for obstruction and known mets to liver and lung.   CXR with possible postobstructive pneumonia ? lymphangiitic spread and capillary leak with elevated BNP.  Surprised that [place] negative for DVT with d-dimer 17 And recent 17 day hospital stay.  EF has been 45% with chronic afib failed DCC.  Coumadin stopped over a year ago because of GI bleeding and metastatic disease  Suggested palliative care/hospice consult.  His albumin is 2 with no appetite. I.V. Bid lasix for comfort, swelling and to try to dry lungs.a bit.  LFT;s elevated From metastatic disease.  DVT lovenox at most.    Charlton Haws 7:34 PM 04/09/2012

## 2012-04-09 NOTE — ED Provider Notes (Signed)
History     CSN: [id number]  Arrival date & time 04/09/12  1430   First MD Initiated Contact with Patient 04/09/12 1519      Chief Complaint  Patient presents with  . Leg Swelling   Level V caveat for confusion  (Consider location/radiation/quality/duration/timing/severity/associated sxs/prior treatment) HPI  Patient's wife states patient was discharged from the hospital yesterday after being admitted for 17 days. 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 He had intestinal bypass done. She relates today the home health nurse checked his oxygen level and it was only 80% while wearing his oxygen. She reports he only used oxygen this last hospital admission and only when he was ambulatory. She also relates he's confused today and states he would be swinging his arms and saying he was fighting/sparing with his grandson. He also thought he was still in the hospital. She also states he had some mild swelling of his legs but that has gotten worse today. She states she gave him 40 mg of Lasix today without improvement or without much urinary output. She states he has chronic atrial fib and has had attempted cardioversion several times without success so he is currently left in atrial fib. His Coumadin and aspirin products were stopped about a year ago because of GI bleeding and that was when his colon cancer was discovered. She relates he's had decreased appetite and has only complaints of pain is his back. She rates relates a most recent PET scan did show a new lesion to his liver and lung and his oncologist is contemplating restarting chemotherapy once he is recovered from his abdominal surgery. She reports he is having marked dyspnea on exertion today and gas when he tries to walk. She does also tell me he short of breath when he walks. She states she has a cough with some white sputum but does not have fever. He continues to have decreased  appetite. She relates although his records he has a history congestive heart failure that he has never had a diagnosis of congestive heart failure and she states Dr. [name] also verified he has not had congestive heart failure to the patient's wife.  PCP Dr Gaynelle Arabian Oncologist Dr Truett Perna Cardiologist Dr [name]  Past Medical History  Diagnosis Date  . History of ETOH abuse     quit 25 yrs ago, heavy etoh 5 yrs  . Colitis   . History of GI diverticular bleed 3/11  . Rectus sheath hematoma 3/10  . Status post Maze operation for atrial fibrillation   . Aortic aneurysm   . 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 S/P colonoscopy 05/03/10    mod diverticulosis sigmoid colon, internal hemorrhoids, suspected diverticular bleed  . Diverticulosis 07/23/2008    colonoscopy by Dr Jena Gauss, hyperplastic polyp  . IDA (iron deficiency anemia)     work-up including small bowel capsule study benign  . COPD (chronic obstructive pulmonary disease)   . CAD (coronary artery disease)     s/p CABG  . S/P aortic valve replacement   . Cancer right colon T3N2M1  . Weight decrease     20-30 lb in month  . Pneumonia   . History of colon polyps   . Shortness of breath   . Arthritis     bilateral neck and shoulder pain  . Atrial fibrillation     Past  Surgical History  Procedure Date  . Maze   . Aortic valve replacement 04/16/2007  . Hemorrhoid surgery 1997  . Esophagogastroduodenoscopy 05/14/11    Dr Elnoria Howard reportedly normal  . Tonsillectomy   . Colonoscopy 05/17/11, 07/23/08, 07/05/06, 10/10/05    at [facility]: diverticlosis,internal hemorrhiods  . Portacath placement 07/16/2011  . Colon surgery 06/09/11    LAP. RIGHT COLECTOMY  . Cholecystectomy 06/09/11  . Cardiac catheterization 04/03/2007  . Esophagogastroduodenoscopy 03/23/2012    Procedure: ESOPHAGOGASTRODUODENOSCOPY (EGD);  Surgeon: Hart Carwin, MD;  Location: [facility];  Service: Endoscopy;  Laterality: N/A;  . Gastrojejunostomy 03/25/2012    Procedure: GASTROJEJUNOSTOMY;  Surgeon: Ernestene Mention, MD;  Location: [facility]S;  Service: General;  Laterality: N/A;  . Ventral hernia repair 03/25/2012    Procedure: HERNIA REPAIR VENTRAL ADULT;  Surgeon: Ernestene Mention, MD;  Location: [facility];  Service: General;  Laterality: N/A;    Family History  Problem Relation Age of Onset  . Lung cancer Mother   . Cancer Mother     lung  . Coronary artery disease Father   . Heart attack Father   . Lung cancer Sister   . Cancer Sister     lung  . Pancreatic cancer Brother   . Cancer Brother     pancreatic    History  Substance Use Topics  . Smoking status: Current Some Day Smoker -- 1.5 packs/day for 56 years    Types: Cigarettes    Last Attempt to Quit: 08/25/2011  . Smokeless tobacco: Not on file   Comment: recently restarted  . Alcohol Use: No     history etoh abuse, quit 25 yrs ago  lives at home Lives with wife Now on home oxygen    Review of Systems  All other systems reviewed and are negative.    Allergies  Diltiazem hcl; Penicillins; and Plasma human  Home Medications   Current Outpatient Rx  Name Route Sig Dispense Refill  . ALPRAZOLAM 0.5 MG PO TABS  TAKE 1/2 TO ONE TAB BY MOUTH EVERY 12 HOURS PRN ANXIETY. MAY ALSO USE FOR SLEEP PRN. 30 tablet 0  . MAGIC MOUTHWASH Oral Take 10 mLs by mouth 3 (three) times daily. 900 mL 0  . DIGOXIN 0.125 MG PO TABS Oral Take 1 tablet (0.125 mg total) by mouth daily. 30 tablet 1  . DOCUSATE SODIUM 100 MG PO CAPS Oral Take 100 mg by mouth 2 (two) times daily.      Marland Kitchen FOLIC ACID 400 MCG PO TABS Oral Take 400 mcg by mouth daily.      . FUROSEMIDE 40 MG PO TABS Oral Take 1 tablet (40 mg total) by mouth daily. 30 tablet 1  . HYDROCODONE-ACETAMINOPHEN 10-325 MG PO TABS Oral Take 1 tablet by mouth every 6 (six) hours as needed for pain. 75 tablet 0  . LIDOCAINE-PRILOCAINE 2.5-2.5 % EX CREA  Ad lib.    Marland Kitchen  MEGESTROL ACETATE 625 MG/5ML PO SUSP Oral Take 5 mLs (625 mg total) by mouth daily. 150 mL 0  . METOPROLOL TARTRATE 100 MG PO TABS Oral Take 1 tablet (100 mg total) by mouth 2 (two) times daily. 60 tablet 1  . MIRTAZAPINE 15 MG PO TABS Oral Take 1 tablet (15 mg total) by mouth at bedtime. 30 tablet 5  . ONDANSETRON HCL 8 MG PO TABS Oral Take by mouth every 8 (eight) hours as needed. For nausea    . PANTOPRAZOLE SODIUM 40 MG PO TBEC Oral Take  1 tablet (40 mg total) by mouth daily. 30 tablet 2    BP 113/62  Pulse 102  Temp(Src) 97.8 F (36.6 C) (Oral)  Resp 24  SpO2 94%  Vital signs normal except tachycardia   Physical Exam  Nursing note and vitals reviewed. Constitutional: He is oriented to person, place, and time. He appears well-developed and well-nourished.  Non-toxic appearance. He does not appear ill. No distress.  HENT:  Head: Normocephalic and atraumatic.  Right Ear: External ear normal.  Left Ear: External ear normal.  Nose: Nose normal. No mucosal edema or rhinorrhea.  Mouth/Throat: Oropharynx is clear and moist and mucous membranes are normal. No dental abscesses or uvula swelling.  Eyes: Conjunctivae and EOM are normal. Pupils are equal, round, and reactive to light.  Neck: Normal range of motion and full passive range of motion without pain. Neck supple.  Cardiovascular: Normal rate, regular rhythm and normal heart sounds.  Exam reveals no gallop and no friction rub.   No murmur heard. Pulmonary/Chest: Breath sounds normal. He is in respiratory distress. He has no wheezes. He has no rhonchi. He has no rales. He exhibits no tenderness and no crepitus.       Tachypnea, +JVD, rare rhonchi  Abdominal: Soft. Normal appearance and bowel sounds are normal. He exhibits distension. There is no tenderness. There is no rebound and no guarding.       Abdomen appears distended but is soft, has well healing midline surgical scar.  Musculoskeletal: Normal range of motion. He exhibits  edema. He exhibits no tenderness.       Pitting edema to knees  Neurological: He is alert and oriented to person, place, and time. He has normal strength. No cranial nerve deficit.  Skin: Skin is warm, dry and intact. No rash noted. No erythema. There is pallor.  Psychiatric: His speech is normal and behavior is normal. His mood appears not anxious.       Flat affect    ED Course  Procedures (including critical care time)   Medications  aztreonam (AZACTAM) 2 g in dextrose 5 % 50 mL IVPB (not administered)  levofloxacin (LEVAQUIN) IVPB 750 mg (750 mg Intravenous Given 04/09/12 1935)  furosemide (LASIX) injection 60 mg (60 mg Intravenous Given 04/09/12 1645)  iohexol (OMNIPAQUE) 300 MG/ML solution 100 mL (100 mL Intravenous Contrast Given 04/09/12 1904)   Doppler US of legs is negative for DVT, bakers cyst.   19:30 Dr [name] called, he will see patient tomorrow, has told wife outlook is very poor  19:39 Dr Adela Glimpse will come see patient for admission and decide if he needs tele or hospice admission  20:33 Dr Adela Glimpse wants him admitted to tele, team 2, Dr Waymon Amato and she will write admission orders.  Results for orders placed during the hospital encounter of 04/09/12  CBC      Component Value Range   WBC 14.5 (*) 4.0 - 10.5 (K/uL)   RBC 3.30 (*) 4.22 - 5.81 (MIL/uL)   Hemoglobin 10.8 (*) 13.0 - 17.0 (g/dL)   HCT 16.1 (*) 09.6 - 52.0 (%)   MCV 92.1  78.0 - 100.0 (fL)   MCH 32.7  26.0 - 34.0 (pg)   MCHC 35.5  30.0 - 36.0 (g/dL)   RDW 04.5  40.9 - 81.1 (%)   Platelets 400  150 - 400 (K/uL)  DIFFERENTIAL      Component Value Range   Neutrophils Relative 89 (*) 43 - 77 (%)   Neutro Abs 12.9 (*) 1.7 -  7.7 (K/uL)   Lymphocytes Relative 5 (*) 12 - 46 (%)   Lymphs Abs 0.8  0.7 - 4.0 (K/uL)   Monocytes Relative 6  3 - 12 (%)   Monocytes Absolute 0.8  0.1 - 1.0 (K/uL)   Eosinophils Relative 0  0 - 5 (%)   Eosinophils Absolute 0.0  0.0 - 0.7 (K/uL)   Basophils Relative 0  0 - 1 (%)    Basophils Absolute 0.0  0.0 - 0.1 (K/uL)  COMPREHENSIVE METABOLIC PANEL      Component Value Range   Sodium 132 (*) 135 - 145 (mEq/L)   Potassium 4.1  3.5 - 5.1 (mEq/L)   Chloride 97  96 - 112 (mEq/L)   CO2 24  19 - 32 (mEq/L)   Glucose, Bld 103 (*) 70 - 99 (mg/dL)   BUN 35 (*) 6 - 23 (mg/dL)   Creatinine, Ser 7.84  0.50 - 1.35 (mg/dL)   Calcium 8.5  8.4 - 69.6 (mg/dL)   Total Protein 6.8  6.0 - 8.3 (g/dL)   Albumin 2.0 (*) 3.5 - 5.2 (g/dL)   AST 61 (*) 0 - 37 (U/L)   ALT 124 (*) 0 - 53 (U/L)   Alkaline Phosphatase 460 (*) 39 - 117 (U/L)   Total Bilirubin 0.6  0.3 - 1.2 (mg/dL)   GFR calc non Af Amer 62 (*) >90 (mL/min)   GFR calc Af Amer 72 (*) >90 (mL/min)  PRO B NATRIURETIC PEPTIDE      Component Value Range   Pro B Natriuretic peptide (BNP) 1549.0 (*) 0 - 125 (pg/mL)  TROPONIN I      Component Value Range   Troponin I <0.30  <0.30 (ng/mL)  APTT      Component Value Range   aPTT 141 (*) 24 - 37 (seconds)  PROTIME-INR      Component Value Range   Prothrombin Time 15.1  11.6 - 15.2 (seconds)   INR 1.17  0.00 - 1.49   D-DIMER, QUANTITATIVE      Component Value Range   D-Dimer, Quant 17.09 (*) 0.00 - 0.48 (ug/mL-FEU)  URINALYSIS, ROUTINE W REFLEX MICROSCOPIC      Component Value Range   Color, Urine AMBER (*) YELLOW    APPearance CLEAR  CLEAR    Specific Gravity, Urine 1.018  1.005 - 1.030    pH 6.0  5.0 - 8.0    Glucose, UA NEGATIVE  NEGATIVE (mg/dL)   Hgb urine dipstick NEGATIVE  NEGATIVE    Bilirubin Urine NEGATIVE  NEGATIVE    Ketones, ur NEGATIVE  NEGATIVE (mg/dL)   Protein, ur NEGATIVE  NEGATIVE (mg/dL)   Urobilinogen, UA 1.0  0.0 - 1.0 (mg/dL)   Nitrite NEGATIVE  NEGATIVE    Leukocytes, UA NEGATIVE  NEGATIVE    Laboratory interpretation all normal except chronic elevation of LFTs, hyponatremia, elevated d-dimer, slightly more elevated BNP, mild anemia   Ct Head Wo Contrast  04/09/2012  *RADIOLOGY REPORT*  Clinical Data: Altered mental status, stage IV colon  cancer  CT HEAD WITHOUT CONTRAST  Technique:  Contiguous axial images were obtained from the base of the skull through the vertex without contrast.  Comparison: None.  Findings: Motion degraded images.  No evidence of parenchymal hemorrhage or extra-axial fluid collection. No mass lesion, mass effect, or midline shift.  No CT evidence of acute infarction.  Mild small vessel ischemic changes.  Cerebral volume is age appropriate.  No ventriculomegaly.  The visualized paranasal sinuses are essentially clear. The mastoid  air cells are unopacified.  No evidence of calvarial fracture.  IMPRESSION: Motion degraded images.  No evidence of acute intracranial abnormality.  Original Report Authenticated By: Charline Bills, M.D.   Ct Angio Chest W/cm &/or Wo Cm  04/09/2012  *RADIOLOGY REPORT*  Clinical Data: Shortness of breath and elevated D-dimer; history of colon cancer  CT ANGIOGRAPHY CHEST  Technique:  Multidetector CT imaging of the chest using the standard protocol during bolus administration of intravenous contrast. Multiplanar reconstructed images including MIPs were obtained and reviewed to evaluate the vascular anatomy.  Contrast: OMNIPAQUE IOHEXOL 300 MG/ML  SOLN  Comparison: July 11, 2011  Findings: Patchy infiltrates are present throughout both lungs, most prominent in the right lung base.  There is also a moderate sized right pleural effusion.  There is new mediastinal and bihilar adenopathy, most severe in the right infrahilar region with resultant narrowing of the segmental bronchi and sub subsegmental pulmonary artery branches supplying the right lower lobe.  There are no pulmonary emboli.  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  No evidence of dissection.  Within the visualized upper abdomen, ascites is noted.  IMPRESSION: There is new bihilar and mediastinal adenopathy which is most severe in the right infrahilar  region with resultant narrowing of the right lower lobe bronchi and pulmonary artery branches.  This is worrisome for metastatic disease.  Bilateral airspace infiltrates, most prominent in the right lower lobe.  Differential considerations include infection versus neoplasm.  PET scan may be helpful for differentiation.  Moderate sized right pleural effusion.  Original Report Authenticated By: Brandon Melnick, M.D.   Dg Chest Port 1 View  04/09/2012  *RADIOLOGY REPORT*  Clinical Data: Shortness of breath and bilateral leg swelling.  PORTABLE CHEST - 1 VIEW  Comparison: Portable chest 03/28/2012.  Findings: A right IJ Port-A-Cath is stable.  Left midline opacification is improved.  Airspace disease in the right lower lobe appears to be increasing.  This could be postobstructive based on the prior PET scan.  Mild interstitial prominence suggests edema.  IMPRESSION:  1.  Improving left airspace disease. 2.  Increasing right lower lobe airspace disease may represent pneumonia or postobstructive pneumonitis. 3.  Probable mild edema.  Original Report Authenticated By: Jamesetta Orleans. MATTERN, M.D.     Date: 04/09/2012  Rate: 99  Rhythm: atrial fibrillation  QRS Axis: normal  Intervals: normal  ST/T Wave abnormalities: normal  Conduction Disutrbances:none  Narrative Interpretation: Q waves inf leads  Old EKG Reviewed: unchanged from 4/4 2013    1. Shortness of breath   2. Metastatic colon cancer to liver   3. HAP (hospital-acquired pneumonia)   4. Peripheral edema     Plan admission  Devoria Albe, MD, FACEP   MDM          Ward Givens, MD 04/09/12 2036

## 2012-04-09 NOTE — H&P (Signed)
PCP:   Colette Ribas, MD, MD  Oncologist [facility] Cardiologist [facility]ish[facility]  Chief Complaint:   Sob  HPI: Kyle Love is a 71 y.o. 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 He had undergone gastrojejunostomy while hospitalized and now is 2 weeks after the surgery. He has had poor appetite and not able to keep up with by mouth intake for quite some time. His last chemotherapy was about a month ago.  Patient has history of chronic atrial fibrillation currently not on Coumadin secondary to recurrent GI bleeding. She did receive IV fluids while in the hospital and his Lasix had been stopped secondary to dehydration.  Since patient's discharge yesterday he has had severe lower extremely  swelling and worsening shortness of breath. He was found to be hypoxic down to 80's while in emergency department. Doppler was done and showed no evidence of DVT CT angiogram chest done and showed no evidence of PE but infiltrates worrisome for either infection or metastases.  Family he also had had confusion for the past 24 hours. Dr. [name] is this patient's cardiologist. He have spoke at length with the family regarding overall poor prognosis and the need for palliative care consult. Family at this point states patient does not wish to be intubated  or resuscitated but they would like to speak to oncology is Dr. Truett Perna first prior to initiating hospice. At this point  family wishes for Antibiotics to be given if needed.  But they do understand the patient is not a candidate for more aggressive management.    Review of Systems:    Pertinent positives include:chills,  shortness of breath at rest.Confusion  Constitutional:  No weight loss, night sweats, Fevers, fatigue, weight loss  HEENT:  No headaches, Difficulty swallowing,Tooth/dental problems,Sore throat,  No sneezing, itching, ear ache, nasal congestion, post nasal drip,  Cardio-vascular:  No chest pain, Orthopnea, PND, anasarca, dizziness, palpitations.no Bilateral lower extremity swelling  GI:  No heartburn, indigestion, abdominal pain, nausea, vomiting, diarrhea, change in bowel habits, loss of appetite, melena, blood in stool, hematemesis Resp:  no No dyspnea on exertion, No excess mucus, no productive cough, No non-productive cough, No coughing up of blood.No change in color of mucus.No wheezing. Skin:  no rash or lesions. No jaundice GU:  no dysuria, change in color of urine, no urgency or frequency. No straining to urinate.  No flank pain.  Musculoskeletal:  No joint pain or no joint swelling. No decreased range of motion. No back pain.  Psych:  No change in mood or affect. No depression or anxiety. No memory loss.  Neuro: no localizing neurological complaints, no tingling, no weakness, no double vision, no gait abnormality, no slurred speech, no confusion  Otherwise ROS are negative except for above, 10 systems were reviewed  Past Medical History: Past Medical History  Diagnosis Date  . History of ETOH abuse     quit 25 yrs ago, heavy etoh 5 yrs  . Colitis   .  History of GI diverticular bleed 3/11  . Rectus sheath hematoma 3/10  . Status post Maze operation for atrial fibrillation   . Aortic aneurysm   . 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 S/P colonoscopy 05/03/10    mod diverticulosis sigmoid colon, internal hemorrhoids, suspected diverticular bleed  . Diverticulosis 07/23/2008    colonoscopy by Dr Jena Gauss, hyperplastic polyp  . IDA (iron deficiency anemia)     work-up including small bowel capsule study benign  . COPD (chronic obstructive pulmonary disease)   . CAD (coronary artery disease)     s/p CABG  . S/P aortic valve replacement   . Cancer right colon T3N2M1  . Weight decrease     20-30 lb in month  . Pneumonia   . History of colon polyps   . Shortness of breath   . Arthritis     bilateral neck and shoulder pain  . Atrial fibrillation    Past Surgical History  Procedure Date  . Maze   . Aortic valve replacement 04/16/2007  . Hemorrhoid surgery 1997  . Esophagogastroduodenoscopy 05/14/11    Dr Elnoria Howard reportedly normal  . Tonsillectomy   . Colonoscopy 05/17/11, 07/23/08, 07/05/06, 10/10/05    at [facility]: diverticlosis,internal hemorrhiods  . Portacath placement 07/16/2011  . Colon surgery 06/09/11    LAP. RIGHT COLECTOMY  . Cholecystectomy 06/09/11  . Cardiac catheterization 04/03/2007  . Esophagogastroduodenoscopy 03/23/2012    Procedure: ESOPHAGOGASTRODUODENOSCOPY (EGD);  Surgeon: Hart Carwin, MD;  Location: [facility];  Service: Endoscopy;  Laterality: N/A;  . Gastrojejunostomy 03/25/2012    Procedure: GASTROJEJUNOSTOMY;  Surgeon: Ernestene Mention, MD;  Location: [facility];  Service: General;  Laterality: N/A;  . Ventral hernia repair 03/25/2012    Procedure: HERNIA REPAIR VENTRAL ADULT;  Surgeon: Ernestene Mention, MD;  Location: [facility];  Service: General;  Laterality: N/A;     Medications: Prior to Admission medications   Medication Sig Start Date End Date Taking? Authorizing Provider  ALPRAZolam (XANAX) 0.5 MG tablet TAKE 1/2 TO ONE TAB BY MOUTH EVERY 12 HOURS PRN ANXIETY. MAY ALSO USE FOR SLEEP PRN. 01/01/12  Yes Thornton Papas, MD  Alum & Mag  Hydroxide-Simeth (MAGIC MOUTHWASH) SOLN Take 10 mLs by mouth 3 (three) times daily. 04/08/12  Yes Estela Isaiah Blakes, MD  digoxin (LANOXIN) 0.125 MG tablet Take 1 tablet (0.125 mg total) by mouth daily. 04/08/12 04/08/13 Yes Estela Isaiah Blakes, MD  docusate sodium (COLACE) 100 MG capsule Take 100 mg by mouth 2 (two) times daily.     Yes Historical Provider, MD  folic acid (FOLVITE) 400 MCG tablet Take 400 mcg by mouth daily.     Yes Historical Provider, MD  furosemide (LASIX) 40 MG tablet Take 1 tablet (40 mg total) by mouth daily. 04/08/12 04/08/13 Yes Estela Isaiah Blakes, MD  HYDROcodone-acetaminophen [facility]) 10-325 MG per tablet Take 1 tablet by mouth every 6 (six) hours as needed for pain. 04/08/12  Yes Henderson Cloud, MD  lidocaine-prilocaine (EMLA) cream Ad lib. 07/13/11  Yes Historical Provider, MD  megestrol (MEGACE ES) 625 MG/5ML suspension Take 5 mLs (625 mg total) by mouth daily. 04/08/12 05/08/12 Yes Estela Isaiah Blakes, MD  metoprolol (LOPRESSOR) 100 MG tablet Take 1 tablet (100 mg total) by mouth 2 (two) times daily. 04/08/12 04/08/13 Yes Estela Isaiah Blakes, MD  mirtazapine (REMERON) 15 MG tablet Take 1 tablet (15  mg total) by mouth at bedtime. 03/17/12  Yes Thornton Papas, MD  ondansetron (ZOFRAN) 8 MG tablet Take by mouth every 8 (eight) hours as needed. For nausea 11/29/11  Yes Thornton Papas, MD  pantoprazole (PROTONIX) 40 MG tablet Take 1 tablet (40 mg total) by mouth daily. 03/10/12  Yes Thornton Papas, MD    Allergies:   Allergies  Allergen Reactions  . Diltiazem Hcl     REACTION: Severe lower extremity edema on generic cardizem  . Penicillins     Pt can tolerate cephalosporins/Pt tol Zosyn 11/01/11  . Plasma Human     Social History:  Ambulatory independently  Lives at home with family   reports that he has been smoking Cigarettes.  He has a 84 pack-year smoking history. He has never used smokeless tobacco. He reports that he does not  drink alcohol or use illicit drugs.   Family History: family history includes Cancer in his brother, mother, and sister; Coronary artery disease in his father; Heart attack in his father; Lung cancer in his mother and sister; and Pancreatic cancer in his brother.    Physical Exam: Patient Vitals for the past 24 hrs:  BP Temp Temp src Pulse Resp SpO2  04/09/12 1617 130/59 mmHg - - - 24  95 %  04/09/12 1434 113/62 mmHg 97.8 F (36.6 C) Oral 102  24  94 %    1. General:  in No Acute distress 2. Psychological: Alert and oriented to self   3. Head/ENT:   Moist  Mucous Membranes                          Head Non traumatic, neck supple                          Normal Dentition 4. SKIN: normal Skin turgor,  Skin clean Dry and intact no rash 5. Heart: irregular rate and rhythm no Murmur,somewhat rapid  Rub or gallop 6. Lungs:  occasional  crackles  Herd at the bases somewhat diminished air movement  7. Abdomen: Soft, non-tender,  somewhat  Distended, well healing scar present  8. Lower extremities: no clubbing, cyanosis,  3+ edema Noted  9. Neurologically Grossly intact, moving all 4 extremities equally 10. MSK: Normal range of motion  body mass index is unknown because there is no height or weight on file.   Labs on Admission:   Basename 04/09/12 1600  NA 132*  K 4.1  CL 97  CO2 24  GLUCOSE 103*  BUN 35*  CREATININE 1.15  CALCIUM 8.5  MG --  PHOS --    Basename 04/09/12 1600  AST 61*  ALT 124*  ALKPHOS 460*  BILITOT 0.6  PROT 6.8  ALBUMIN 2.0*   No results found for this basename: LIPASE:2,AMYLASE:2 in the last 72 hours  Basename 04/09/12 1600  WBC 14.5*  NEUTROABS 12.9*  HGB 10.8*  HCT 30.4*  MCV 92.1  PLT 400    Basename 04/09/12 1600  CKTOTAL --  CKMB --  CKMBINDEX --  TROPONINI <0.30   No results found for this basename: TSH,T4TOTAL,FREET3,T3FREE,THYROIDAB in the last 72 hours No results found for this basename:  VITAMINB12:2,FOLATE:2,FERRITIN:2,TIBC:2,IRON:2,RETICCTPCT:2 in the last 72 hours No results found for this basename: HGBA1C    The CrCl is unknown because both a height and weight (above a minimum accepted value) are required for this calculation. ABG No results found for this basename: phart,  pco2, po2, hco3, tco2, acidbasedef, o2sat     Lab Results  Component Value Date   DDIMER 17.09* 04/09/2012     Other results:  I have pearsonaly reviewed this: ECG REPORT  Rate:99  Rhythm: atrial fib ST&T Change: no ischemic changes  UA no evidence of infection  Digoxin Level 0.8  Cultures:    Component Value Date/Time   SDES BLOOD RIGHT HAND 10/30/2011 2333   SPECREQUEST BOTTLES DRAWN AEROBIC AND ANAEROBIC 3CC 10/30/2011 2333   CULT NO GROWTH 5 DAYS 10/30/2011 2333   REPTSTATUS 11/06/2011 FINAL 10/30/2011 2333       Radiological Exams on Admission: Ct Head Wo Contrast  04/09/2012  *RADIOLOGY REPORT*  Clinical Data: Altered mental status, stage IV colon cancer  CT HEAD WITHOUT CONTRAST  Technique:  Contiguous axial images were obtained from the base of the skull through the vertex without contrast.  Comparison: None.  Findings: Motion degraded images.  No evidence of parenchymal hemorrhage or extra-axial fluid collection. No mass lesion, mass effect, or midline shift.  No CT evidence of acute infarction.  Mild small vessel ischemic changes.  Cerebral volume is age appropriate.  No ventriculomegaly.  The visualized paranasal sinuses are essentially clear. The mastoid air cells are unopacified.  No evidence of calvarial fracture.  IMPRESSION: Motion degraded images.  No evidence of acute intracranial abnormality.  Original Report Authenticated By: Charline Bills, M.D.   Ct Angio Chest W/cm &/or Wo Cm  04/09/2012  *RADIOLOGY REPORT*  Clinical Data: Shortness of breath and elevated D-dimer; history of colon cancer  CT ANGIOGRAPHY CHEST  Technique:  Multidetector CT imaging of the chest using  the standard protocol during bolus administration of intravenous contrast. Multiplanar reconstructed images including MIPs were obtained and reviewed to evaluate the vascular anatomy.  Contrast: OMNIPAQUE IOHEXOL 300 MG/ML  SOLN  Comparison: July 11, 2011  Findings: Patchy infiltrates are present throughout both lungs, most prominent in the right lung base.  There is also a moderate sized right pleural effusion.  There is new mediastinal and bihilar adenopathy, most severe in the right infrahilar region with resultant narrowing of the segmental bronchi and sub subsegmental pulmonary artery branches supplying the right lower lobe.  There are no pulmonary emboli.  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  No evidence of dissection.  Within the visualized upper abdomen, ascites is noted.  IMPRESSION: There is new bihilar and mediastinal adenopathy which is most severe in the right infrahilar region with resultant narrowing of the right lower lobe bronchi and pulmonary artery branches.  This is worrisome for metastatic disease.  Bilateral airspace infiltrates, most prominent in the right lower lobe.  Differential considerations include infection versus neoplasm.  PET scan may be helpful for differentiation.  Moderate sized right pleural effusion.  Original Report Authenticated By: Brandon Melnick, M.D.   Dg Chest Port 1 View  04/09/2012  *RADIOLOGY REPORT*  Clinical Data: Shortness of breath and bilateral leg swelling.  PORTABLE CHEST - 1 VIEW  Comparison: Portable chest 03/28/2012.  Findings: A right IJ Port-A-Cath is stable.  Left midline opacification is improved.  Airspace disease in the right lower lobe appears to be increasing.  This could be postobstructive based on the prior PET scan.  Mild interstitial prominence suggests edema.  IMPRESSION:  1.  Improving left airspace disease. 2.  Increasing right lower lobe airspace  disease may represent pneumonia or postobstructive pneumonitis. 3.  Probable mild edema.  Original Report Authenticated By: Jamesetta Orleans. MATTERN, M.D.    Assessment/Plan  This is a 70 year old gentleman who anesthetic colon cancer who was just recently admitted and undergone gastrojejunostomy for duodenal stenosis possibly secondary to malignancy comes in with hypoxia and shortness of breath likely secondary to fluid overload/possible postobstructive pneumonia versus metastatic progression of lung disease.  Present on Admission:  .PNA (pneumonia)  -  I suspect this could be postobstructive, vs hospital acquired PNA the patient is penicillin allergic I am also not certain at this point the family is planning to switch to comfort care near future. For now we will initiate Levoquin and vancomycin. I would avoid clindamycin as this can cause a C. difficile colitis.  .Atrial fibrillation - this is chronic patient not a Coumadin candidate secondary to history of GI bleed. We'll continue metoprolol and digoxin  .CAD, ARTERY BYPASS GRAFT - stable from a cardiology standpoint.  .COLON CANCER -would need to discuss patient with Dr. Truett Perna in the morning. Family would like to have a discussion about patient's prognosis. They have been recommended hospice care by Dr. [name] but would like to speak to oncology prior to making a final decision. Spoke to Dr. Darrold Span to let oncology know patient is here. She said would let Dr. Truett Perna know but also for [place] to contact him as well in AM.   .Shortness of breath -likely multifactorial probably a combination of infectious versus malignancy versus fluid overload  .Anasarca - will give IV Lasix check prealbumin patient has a severe hypoalbuminemia  .Hypoalbuminemia - protein deficient malnutrition versus liver disease secondary to metastatic cancer. He may have a nutrition consult. Patient just recently undergone gastrojejunostomy .    Prophylaxis:  Lovenox,  Protonix  CODE STATUS: DNR/DNI as per family and patient's wishes   I have spent a total of 65  min on this admission  Charnese Federici 04/09/2012, 8:22 PM

## 2012-04-10 ENCOUNTER — Telehealth: Payer: Self-pay | Admitting: Oncology

## 2012-04-10 ENCOUNTER — Encounter: Payer: Self-pay | Admitting: Medical Oncology

## 2012-04-10 DIAGNOSIS — C787 Secondary malignant neoplasm of liver and intrahepatic bile duct: Secondary | ICD-10-CM

## 2012-04-10 DIAGNOSIS — C7800 Secondary malignant neoplasm of unspecified lung: Principal | ICD-10-CM

## 2012-04-10 DIAGNOSIS — C189 Malignant neoplasm of colon, unspecified: Secondary | ICD-10-CM

## 2012-04-10 LAB — COMPREHENSIVE METABOLIC PANEL
Alkaline Phosphatase: 383 U/L — ABNORMAL HIGH (ref 39–117)
BUN: 35 mg/dL — ABNORMAL HIGH (ref 6–23)
Chloride: 99 mEq/L (ref 96–112)
Creatinine, Ser: 1.29 mg/dL (ref 0.50–1.35)
GFR calc Af Amer: 62 mL/min — ABNORMAL LOW (ref >90)
Glucose, Bld: 82 mg/dL (ref 70–99)
Potassium: 3.8 mEq/L (ref 3.5–5.1)
Total Bilirubin: 0.6 mg/dL (ref 0.3–1.2)
Total Protein: 6.6 g/dL (ref 6.0–8.3)

## 2012-04-10 LAB — CBC
Platelets: 362 10*3/uL (ref 150–400)
RBC: 3.07 MIL/uL — ABNORMAL LOW (ref 4.22–5.81)
RDW: 15.2 % (ref 11.5–15.5)
WBC: 12.8 10*3/uL — ABNORMAL HIGH (ref 4.0–10.5)

## 2012-04-10 LAB — DIFFERENTIAL
Basophils Absolute: 0 10*3/uL (ref 0.0–0.1)
Lymphocytes Relative: 8 % — ABNORMAL LOW (ref 12–46)
Lymphs Abs: 1 10*3/uL (ref 0.7–4.0)
Neutro Abs: 10.9 10*3/uL — ABNORMAL HIGH (ref 1.7–7.7)

## 2012-04-10 LAB — PREALBUMIN: Prealbumin: <3 mg/dL — ABNORMAL LOW (ref 17.0–34.0)

## 2012-04-10 MED ORDER — ENSURE COMPLETE PO LIQD
237.0000 mL | Freq: Two times a day (BID) | ORAL | Status: DC
  Administered 2012-04-10 – 2012-04-16 (×11): 237 mL via ORAL

## 2012-04-10 MED ORDER — ENSURE PUDDING PO PUDG
1.0000 | Freq: Every morning | ORAL | Status: DC
  Administered 2012-04-10 – 2012-04-16 (×6): 1 via ORAL
  Filled 2012-04-10 (×8): qty 1

## 2012-04-10 NOTE — Progress Notes (Signed)
Pt is in [facility]. His daughter called stating that Dr. Truett Perna saw her dad this am he was going to order a test to make his breathing better. She states that they are no orders for this. She would like to know if I could ask Dr. Truett Perna about it and all her back. Per Dr. Truett Perna he has been so busy in clinic but he will get the orders in so pt can get fluid drawn off. It will probably be tomorrow before this procedure will be done. I spoke with the daughter and the floor nurse with this update.

## 2012-04-10 NOTE — Progress Notes (Signed)
   CARE MANAGEMENT NOTE 04/10/2012  Patient:  Kyle Love, Kyle Love   Account Number:  [id number]  Date Initiated:  04/10/2012  Documentation initiated by:  Jiles Crocker  Subjective/Objective Assessment:   ADMITTED WITH SOB/ PNEUMONIA     Action/Plan:   PCP IS DR Lance Morin WITH SPOUSE; RECEIVING HHC WITH [facility]   Anticipated DC Date:  04/17/2012   Anticipated DC Plan:  HOME/SELF CARE         Choice offered to / List presented to:             Status of service:  In process, will continue to follow Medicare Important Message given?   (If response is "NO", the following Medicare IM given date fields will be blank) Date Medicare IM given:   Date Additional Medicare IM given:    Discharge Disposition:    Per UR Regulation:  Reviewed for med. necessity/level of care/duration of stay  If discussed at Long Length of Stay Meetings, dates discussed:    Comments:  04/10/2012- PATIENT WELL KNOWN TO ME FROM PREVIOUS ADMISSION, ACTIVE WITH [facility]; WILL CONTINUE TO FOLLOW FOR DCP; B Marcedes Tech RN, BSN, MHA

## 2012-04-10 NOTE — Progress Notes (Signed)
INITIAL ADULT NUTRITION ASSESSMENT Date: 04/10/2012   Time: 10:09 AM Reason for Assessment: Consult for poor PO   ASSESSMENT: Male 71 y.o.  Dx: SOB  Hx:  Past Medical History  Diagnosis Date  . History of ETOH abuse     quit 25 yrs ago, heavy etoh 5 yrs  . Colitis   . History of GI diverticular bleed 3/11  . Rectus sheath hematoma 3/10  . Status post Maze operation for atrial fibrillation   . Aortic aneurysm   . 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 S/P colonoscopy 05/03/10    mod diverticulosis sigmoid colon, internal hemorrhoids, suspected diverticular bleed  . Diverticulosis 07/23/2008    colonoscopy by Dr Jena Gauss, hyperplastic polyp  . IDA (iron deficiency anemia)     work-up including small bowel capsule study benign  . COPD (chronic obstructive pulmonary disease)   . CAD (coronary artery disease)     s/p CABG  . S/P aortic valve replacement   . Cancer right colon T3N2M1  . Weight decrease     20-30 lb in month  . Pneumonia   . History of colon polyps   . Shortness of breath   . Arthritis     bilateral neck and shoulder pain  . Atrial fibrillation     Related Meds:  Scheduled Meds:   . aztreonam  2 g Intravenous Q8H  . digoxin  0.125 mg Oral Daily  . docusate sodium  100 mg Oral BID  . enoxaparin  40 mg Subcutaneous Q24H  . feeding supplement  237 mL Oral Once  . folic acid  400 mcg Oral Daily  . furosemide  40 mg Intravenous BID  . furosemide  60 mg Intravenous Once  . HYDROcodone-acetaminophen      . levofloxacin (LEVAQUIN) IV  750 mg Intravenous Q24H  . magic mouthwash  10 mL Oral TID  . megestrol  800 mg Oral Daily  . metoprolol  100 mg Oral BID  . mirtazapine  15 mg Oral QHS  . pantoprazole  40 mg Oral Daily  . sodium chloride  3 mL Intravenous Q12H  . vancomycin  750 mg Intravenous Q12H  . DISCONTD: megestrol  625 mg Oral Daily  . DISCONTD: megestrol  625 mg Oral Daily    Continuous Infusions:  PRN Meds:.sodium chloride, ALPRAZolam, HYDROcodone-acetaminophen, iohexol, ondansetron, sodium chloride   Ht: 5\' 10"  (177.8 cm)  Wt: 192 lb 6.4 oz (87.272 kg)  Ideal Wt: 75.45 kg % Ideal Wt: 115.6%  Usual Wt: 226 lb. In June 2012 per patient and family % Usual Wt: 84.9%  Wt Readings from Last 3 Encounters:  04/10/12 192 lb 6.4 oz (87.272 kg)  04/08/12 198 lb 10.2 oz (90.1 kg)  04/08/12 198 lb 10.2 oz (90.1 kg)   *Patient with 34 lb. Unintentional weight loss over 10 months. 15% from baseline.   Body mass index is 27.61 kg/(m^2). (Overweight)  Food/Nutrition Related Hx: Patient reported poor to no appetite. This has been an issue over the past month. Patient with poor PO intake over the past month, <50% at meals. Patient reports poor appetite and intake over the last month due to excessive pain and swollen lymph nodes. Patient with poor PO intake documented 0-5%. Patient declines to receive snacks. Patient drinks up to 3 Ensure nutrition supplements daily at home.   Labs:  CMP     Component Value Date/Time   NA  132* 04/10/2012 0447   K 3.8 04/10/2012 0447   CL 99 04/10/2012 0447   CO2 23 04/10/2012 0447   GLUCOSE 82 04/10/2012 0447   BUN 35* 04/10/2012 0447   CREATININE 1.29 04/10/2012 0447   CALCIUM 8.4 04/10/2012 0447   PROT 6.6 04/10/2012 0447   ALBUMIN 1.8* 04/10/2012 0447   AST 48* 04/10/2012 0447   ALT 96* 04/10/2012 0447   ALKPHOS 383* 04/10/2012 0447   BILITOT 0.6 04/10/2012 0447   GFRNONAA 54* 04/10/2012 0447   GFRAA 62* 04/10/2012 0447     Intake/Output Summary (Last 24 hours) at 04/10/12 1011 Last data filed at 04/10/12 1478  Gross per 24 hour  Intake    540 ml  Output    250 ml  Net    290 ml      Diet Order: General  Supplements/Tube Feeding: Ensure once daily. Provides 250 kcal and 9 grams of protein.   IVF:    Estimated Nutritional Needs:   Kcal: 2956-2130 Protein: 113-150.9 grams Fluid: 1 ml per kcal  NUTRITION  DIAGNOSIS: -Inadequate oral intake (NI-2.1).  Status: Ongoing  RELATED TO: poor appetite  AS EVIDENCE BY: patient reports poor appetite and intake over the past month and poor PO intake documented 0-5% at meals.   MONITORING/EVALUATION(Goals): Weight trends, labs, I/O's, PO intake 1. PO intake > 75% at meals and supplements 2. Minimize weight loss  EDUCATION NEEDS: -No education needs identified at this time  INTERVENTION: 1. Will order patient Ensure nutrition supplement BID. Provides 500 kcal and 18 grams of protein. 2. Will order patient Ensure pudding once daily. Provides 170 kcal and 4 grams of protein. 3. RD to follow for nutrition plan of care.   Dietitian [phone]  DOCUMENTATION CODES Per approved criteria  -Not Applicable    Kyle Love [facility] 04/10/2012, 10:09 AM

## 2012-04-10 NOTE — Progress Notes (Signed)
Patient ID: Kyle Love, male   DOB: 21-Jun-1941, 71 y.o.   MRN: [id number]  Subjective: No events overnight. Patient somnolent but denies chest pain or abdominal pain. Per family member pt has more labored breathing.  Objective:  Vital signs in last 24 hours:  Filed Vitals:   04/10/12 0949 04/10/12 1002 04/10/12 1341 04/10/12 2035  BP: 124/69 124/62 103/65 93/59  Pulse: 108 117 104 107  Temp: 97.8 F (36.6 C)  97.8 F (36.6 C) 97.6 F (36.4 C)  TempSrc: Oral  Oral Oral  Resp: 20  20 19   Height:      Weight:      SpO2: 94%  92% 96%    Intake/Output from previous day:   Intake/Output Summary (Last 24 hours) at 04/10/12 2054 Last data filed at 04/10/12 2000  Gross per 24 hour  Intake   1070 ml  Output   1550 ml  Net   -480 ml    Physical Exam: General: Alert, awake, oriented to name and place, in no acute distress. HEENT: No bruits, no goiter. Moist mucous membranes, no scleral icterus, no conjunctival pallor. Heart: Regular rhythm but tachycardic, S1/S2 +, no murmurs, rubs, gallops. Lungs: Decreased breath sounds on the right side and mostly at bases. No wheezing, no rhonchi, no rales.  Abdomen: Soft, nontender, nondistended, positive bowel sounds. Extremities: No clubbing or cyanosis, no pitting edema,  positive pedal pulses. Neuro: Grossly nonfocal.  Lab Results:  Basic Metabolic Panel:    Component Value Date/Time   NA 132* 04/10/2012 0447   K 3.8 04/10/2012 0447   CL 99 04/10/2012 0447   CO2 23 04/10/2012 0447   BUN 35* 04/10/2012 0447   CREATININE 1.29 04/10/2012 0447   GLUCOSE 82 04/10/2012 0447   CALCIUM 8.4 04/10/2012 0447   CBC:    Component Value Date/Time   WBC 12.8* 04/10/2012 0447   WBC 9.1 03/20/2012 1256   HGB 9.7* 04/10/2012 0447   HGB 12.4* 03/20/2012 1256   HCT 28.4* 04/10/2012 0447   HCT 35.4* 03/20/2012 1256   PLT 362 04/10/2012 0447   PLT 252 03/20/2012 1256   MCV 92.5 04/10/2012 0447   MCV 94.7 03/20/2012 1256   NEUTROABS 10.9* 04/10/2012  0447   NEUTROABS 7.5* 03/20/2012 1256   LYMPHSABS 1.0 04/10/2012 0447   LYMPHSABS 0.6* 03/20/2012 1256   MONOABS 0.8 04/10/2012 0447   MONOABS 1.0* 03/20/2012 1256   EOSABS 0.1 04/10/2012 0447   EOSABS 0.1 03/20/2012 1256   BASOSABS 0.0 04/10/2012 0447   BASOSABS 0.0 03/20/2012 1256      Lab 04/10/12 0447 04/09/12 1600 04/04/12 0446  WBC 12.8* 14.5* 9.9  HGB 9.7* 10.8* 10.3*  HCT 28.4* 30.4* 30.4*  PLT 362 400 300  MCV 92.5 92.1 93.8  MCH 31.6 32.7 31.8  MCHC 34.2 35.5 33.9  RDW 15.2 15.1 14.9  LYMPHSABS 1.0 0.8 --  MONOABS 0.8 0.8 --  EOSABS 0.1 0.0 --  BASOSABS 0.0 0.0 --  BANDABS -- -- --    Lab 04/10/12 0447 04/09/12 1600 04/06/12 0508 04/05/12 0501 04/04/12 0446  NA 132* 132* 128* 130* 129*  K 3.8 4.1 3.5 4.0 4.3  CL 99 97 96 97 99  CO2 23 24 26 25 23   GLUCOSE 82 103* 107* 112* 115*  BUN 35* 35* 23 21 19   CREATININE 1.29 1.15 0.94 0.97 0.87  CALCIUM 8.4 8.5 8.1* 8.4 8.4  MG -- -- -- -- --    Lab 04/09/12 1600  INR 1.17  PROTIME --   Cardiac markers:  Lab 04/09/12 1600  CKMB --  TROPONINI <0.30  MYOGLOBIN --   Studies/Results: Ct Head Wo Contrast  04/09/2012  *RADIOLOGY REPORT*  Clinical Data: Altered mental status, stage IV colon cancer  CT HEAD WITHOUT CONTRAST  Technique:  Contiguous axial images were obtained from the base of the skull through the vertex without contrast.  Comparison: None.  Findings: Motion degraded images.  No evidence of parenchymal hemorrhage or extra-axial fluid collection. No mass lesion, mass effect, or midline shift.  No CT evidence of acute infarction.  Mild small vessel ischemic changes.  Cerebral volume is age appropriate.  No ventriculomegaly.  The visualized paranasal sinuses are essentially clear. The mastoid air cells are unopacified.  No evidence of calvarial fracture.  IMPRESSION: Motion degraded images.  No evidence of acute intracranial abnormality.  Original Report Authenticated By: Charline Bills, M.D.   Ct Angio Chest  W/cm &/or Wo Cm  04/09/2012  *RADIOLOGY REPORT*  Clinical Data: Shortness of breath and elevated D-dimer; history of colon cancer  CT ANGIOGRAPHY CHEST  Technique:  Multidetector CT imaging of the chest using the standard protocol during bolus administration of intravenous contrast. Multiplanar reconstructed images including MIPs were obtained and reviewed to evaluate the vascular anatomy.  Contrast: OMNIPAQUE IOHEXOL 300 MG/ML  SOLN  Comparison: July 11, 2011  Findings: Patchy infiltrates are present throughout both lungs, most prominent in the right lung base.  There is also a moderate sized right pleural effusion.  There is new mediastinal and bihilar adenopathy, most severe in the right infrahilar region with resultant narrowing of the segmental bronchi and sub subsegmental pulmonary artery branches supplying the right lower lobe.  There are no pulmonary emboli.  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  No evidence of dissection.  Within the visualized upper abdomen, ascites is noted.  IMPRESSION: There is new bihilar and mediastinal adenopathy which is most severe in the right infrahilar region with resultant narrowing of the right lower lobe bronchi and pulmonary artery branches.  This is worrisome for metastatic disease.  Bilateral airspace infiltrates, most prominent in the right lower lobe.  Differential considerations include infection versus neoplasm.  PET scan may be helpful for differentiation.  Moderate sized right pleural effusion.  Original Report Authenticated By: Brandon Melnick, M.D.   Dg Chest Port 1 View  04/09/2012  *RADIOLOGY REPORT*  Clinical Data: Shortness of breath and bilateral leg swelling.  PORTABLE CHEST - 1 VIEW  Comparison: Portable chest 03/28/2012.  Findings: A right IJ Port-A-Cath is stable.  Left midline opacification is improved.  Airspace disease in the right lower lobe appears to be  increasing.  This could be postobstructive based on the prior PET scan.  Mild interstitial prominence suggests edema.  IMPRESSION:  1.  Improving left airspace disease. 2.  Increasing right lower lobe airspace disease may represent pneumonia or postobstructive pneumonitis. 3.  Probable mild edema.  Original Report Authenticated By: Jamesetta Orleans. MATTERN, M.D.    Medications: Scheduled Meds:   . aztreonam  2 g Intravenous Q8H  . digoxin  0.125 mg Oral Daily  . docusate sodium  100 mg Oral BID  . enoxaparin  40 mg Subcutaneous Q24H  . feeding supplement  237 mL Oral BID BM  . feeding supplement  1 Container Oral q morning - 10a  . folic acid  400 mcg Oral Daily  . furosemide  40 mg Intravenous  BID  . HYDROcodone-acetaminophen      . levofloxacin (LEVAQUIN) IV  750 mg Intravenous Q24H  . magic mouthwash  10 mL Oral TID  . megestrol  800 mg Oral Daily  . metoprolol  100 mg Oral BID  . mirtazapine  15 mg Oral QHS  . pantoprazole  40 mg Oral Daily  . sodium chloride  3 mL Intravenous Q12H  . vancomycin  750 mg Intravenous Q12H  . DISCONTD: feeding supplement  237 mL Oral Once  . DISCONTD: megestrol  625 mg Oral Daily  . DISCONTD: megestrol  625 mg Oral Daily   Continuous Infusions:  PRN Meds:.sodium chloride, ALPRAZolam, HYDROcodone-acetaminophen, ondansetron, sodium chloride  Assessment/Plan:  Active Problems:  SOB - multifactorial in nature and likely secondary to ? PNA, metastatic disease, obstructive PNA, pleural effusion - pt is currently hemodynamically stable and is maintaining oxygen saturations > 93% on 2 L [place] - he requires as high as 4 L [place] - will continue to monitor vitals per floor protocol - order IR thoracentesis with fluid culture and cytology analysis - appreciate oncology input   PNA (pneumonia) - will continue broad spectrum antibiotics   Atrial fibrillation - currently in sinus rhythm, tachycardic but not in a-fib - continue to monitor vitals per floor  protocol   COLON CANCER - followed by oncology service   Hypoalbuminemia - secondary to malignancy and progressive failure to thrive - continue Megace for appetite stimulation   EDUCATION - test results and diagnostic studies were discussed with patient and pt's family who was present at the bedside - patient and family have verbalized the understanding - questions were answered at the bedside and contact information was provided for additional questions or concerns   LOS: 1 day   Kyle Love 04/10/2012, 8:54 PM  [facility] Pager: [phone]

## 2012-04-10 NOTE — Telephone Encounter (Signed)
S/w the pt'a wife and she is aware of the md appt on 04/17/2012

## 2012-04-10 NOTE — Progress Notes (Signed)
IP PROGRESS NOTE  Subjective:   Kyle Love was readmitted yesterday with dyspnea/hypoxia. He had been discharged on 04/08/2012. His family reports he had anorexia, worsened leg edema, and generalized weakness when at home. He feels better today.  Objective: Vital signs in last 24 hours: Blood pressure 103/65, pulse 104, temperature 97.8 F (36.6 C), temperature source Oral, resp. rate 20, height 5\' 10"  (1.778 m), weight 192 lb 6.4 oz (87.272 kg), SpO2 92.00%.  Intake/Output from previous day: 04/17 0701 - 04/18 0700 In: 540 [I.V.:140; IV Piggyback:400] Out: 250 [Urine:250]  Physical Exam:  Lungs: Decreased breath sounds at the right lower chest, inspiratory rhonchi at the right and left lower chest, no respiratory distress Cardiac: Regular rate and rhythm Abdomen: Healed midline incision, no hepatomegaly, nontender Extremities: Pitting edema at the low leg bilaterally Lymph nodes: No cervical, supraclavicular, axillary, or inguinal nodes  Portacath/PICC-without erythema  Lab Results:  Basename 04/10/12 0447 04/09/12 1600  WBC 12.8* 14.5*  HGB 9.7* 10.8*  HCT 28.4* 30.4*  PLT 362 400    BMET  Basename 04/10/12 0447 04/09/12 1600  NA 132* 132*  K 3.8 4.1  CL 99 97  CO2 23 24  GLUCOSE 82 103*  BUN 35* 35*  CREATININE 1.29 1.15  CALCIUM 8.4 8.5    Studies/Results: Ct Head Wo Contrast  04/09/2012  *RADIOLOGY REPORT*  Clinical Data: Altered mental status, stage IV colon cancer  CT HEAD WITHOUT CONTRAST  Technique:  Contiguous axial images were obtained from the base of the skull through the vertex without contrast.  Comparison: None.  Findings: Motion degraded images.  No evidence of parenchymal hemorrhage or extra-axial fluid collection. No mass lesion, mass effect, or midline shift.  No CT evidence of acute infarction.  Mild small vessel ischemic changes.  Cerebral volume is age appropriate.  No ventriculomegaly.  The visualized paranasal sinuses are essentially clear.  The mastoid air cells are unopacified.  No evidence of calvarial fracture.  IMPRESSION: Motion degraded images.  No evidence of acute intracranial abnormality.  Original Report Authenticated By: Charline Bills, M.D.   Ct Angio Chest W/cm &/or Wo Cm  04/09/2012  *RADIOLOGY REPORT*  Clinical Data: Shortness of breath and elevated D-dimer; history of colon cancer  CT ANGIOGRAPHY CHEST  Technique:  Multidetector CT imaging of the chest using the standard protocol during bolus administration of intravenous contrast. Multiplanar reconstructed images including MIPs were obtained and reviewed to evaluate the vascular anatomy.  Contrast: OMNIPAQUE IOHEXOL 300 MG/ML  SOLN  Comparison: July 11, 2011  Findings: Patchy infiltrates are present throughout both lungs, most prominent in the right lung base.  There is also a moderate sized right pleural effusion.  There is new mediastinal and bihilar adenopathy, most severe in the right infrahilar region with resultant narrowing of the segmental bronchi and sub subsegmental pulmonary artery branches supplying the right lower lobe.  There are no pulmonary emboli.  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  No evidence of dissection.  Within the visualized upper abdomen, ascites is noted.  IMPRESSION: There is new bihilar and mediastinal adenopathy which is most severe in the right infrahilar region with resultant narrowing of the right lower lobe bronchi and pulmonary artery branches.  This is worrisome for metastatic disease.  Bilateral airspace infiltrates, most prominent in the right lower lobe.  Differential considerations include infection versus neoplasm.  PET scan may be helpful for differentiation.  Moderate sized right pleural effusion.  Original Report Authenticated By: Brandon Melnick, M.D.   Dg Chest Port 1 View  04/09/2012  *RADIOLOGY REPORT*  Clinical Data: Shortness of breath  and bilateral leg swelling.  PORTABLE CHEST - 1 VIEW  Comparison: Portable chest 03/28/2012.  Findings: A right IJ Port-A-Cath is stable.  Left midline opacification is improved.  Airspace disease in the right lower lobe appears to be increasing.  This could be postobstructive based on the prior PET scan.  Mild interstitial prominence suggests edema.  IMPRESSION:  1.  Improving left airspace disease. 2.  Increasing right lower lobe airspace disease may represent pneumonia or postobstructive pneumonitis. 3.  Probable mild edema.  Original Report Authenticated By: Jamesetta Orleans. MATTERN, M.D.    Medications: I have reviewed the patient's current medications.  Assessment/Plan:  1. Metastatic colon cancer, presenting with an a sending colon tumor (T3 N2 BM 1) with metastatic omental nodules noted at the time of a right colectomy 06/08/2011. There was initial radiologic improvement with FOLFOX chemotherapy. A restaging PET scan 02/27/2012 revealed a single new hepatic metastasis and a new right lower lobe pulmonary nodule.  2. Admission 03/22/2012 with a proximal small bowel obstruction secondary to metastatic colon cancer, status post a gastrojejunostomy procedure 03/25/2012  3. Atrial fibrillation  4. COPD  5. History of coronary artery disease  6. Dyspnea/hypoxia-I reviewed the CT of the chest from 04/09/2012. He has a right pleural effusion, right lung parenchymal infiltrates, and hilar/mediastinal lymphadenopathy. The chest CT findings could be related to progressive metastatic colon cancer, a primary lung tumor, or infection.   Recommendations:  1. Continue antibiotics and oxygen support  2. Diagnostic/therapeutic right thoracentesis with fluid to be sent for culture and cytology  3. Megace as an appetite stimulant  4. Optimize management of atrial fibrillation and apparent volume overload  5. I will follow him with the medical service while he is in the hospital. We will decide on a  hospice referral versus salvage systemic therapy based on his clinical status after treatment with antibiotics. I am concerned the current presentation is related to progression of the colon cancer which would make it difficult for him to recover and receive additional systemic therapy.   LOS: 1 day   Kyle Love, Kyle Love  04/10/2012, 3:53 PM

## 2012-04-11 ENCOUNTER — Inpatient Hospital Stay (HOSPITAL_COMMUNITY): Payer: Medicare Other

## 2012-04-11 LAB — CBC
MCH: 31.6 pg (ref 26.0–34.0)
MCHC: 33.6 g/dL (ref 30.0–36.0)
MCV: 94.2 fL (ref 78.0–100.0)
Platelets: 362 10*3/uL (ref 150–400)
RDW: 15.5 % (ref 11.5–15.5)

## 2012-04-11 LAB — BASIC METABOLIC PANEL
CO2: 26 mEq/L (ref 19–32)
Calcium: 8.3 mg/dL — ABNORMAL LOW (ref 8.4–10.5)
Creatinine, Ser: 1.2 mg/dL (ref 0.50–1.35)
GFR calc Af Amer: 68 mL/min — ABNORMAL LOW (ref >90)
GFR calc non Af Amer: 59 mL/min — ABNORMAL LOW (ref >90)

## 2012-04-11 MED ORDER — SODIUM CHLORIDE 0.9 % IJ SOLN
10.0000 mL | INTRAMUSCULAR | Status: DC | PRN
  Administered 2012-04-12 – 2012-04-14 (×2): 10 mL

## 2012-04-11 MED ORDER — VANCOMYCIN HCL IN DEXTROSE 1-5 GM/200ML-% IV SOLN
1000.0000 mg | Freq: Two times a day (BID) | INTRAVENOUS | Status: DC
  Administered 2012-04-12 – 2012-04-15 (×8): 1000 mg via INTRAVENOUS
  Filled 2012-04-11 (×10): qty 200

## 2012-04-11 NOTE — Progress Notes (Signed)
IP PROGRESS NOTE  Subjective:   Kyle Love denies pain and nausea. He is tolerating a diet and reports having bowel movements. No new complaint  Objective: Vital signs in last 24 hours: Blood pressure 101/65, pulse 98, temperature 97.7 F (36.5 C), temperature source Oral, resp. rate 18, height 5\' 10"  (1.778 m), weight 190 lb 11.2 oz (86.501 kg), SpO2 92.00%.  Intake/Output from previous day: 04/18 0701 - 04/19 0700 In: 1190 [P.O.:480; I.V.:160; IV Piggyback:550] Out: 2225 [Urine:2225]  Physical Exam:  Lungs: Decreased breath sounds at the right lower chest, inspiratory rhonchi at the right and left lower chest, no respiratory distress Cardiac: Irregular Abdomen: Healed midline incision, no hepatomegaly, nontender, distended Extremities: Pitting edema at the low leg and foot bilaterally  Portacath/PICC-without erythema  Lab Results:  [facility] 04/11/12 0517 04/10/12 0447  WBC 11.6* 12.8*  HGB 9.8* 9.7*  HCT 29.2* 28.4*  PLT 362 362    BMET  Basename 04/11/12 0517 04/10/12 0447  NA 134* 132*  K 3.5 3.8  CL 100 99  CO2 26 23  GLUCOSE 91 82  BUN 32* 35*  CREATININE 1.20 1.29  CALCIUM 8.3* 8.4    Studies/Results: Ct Head Wo Contrast  04/09/2012  *RADIOLOGY REPORT*  Clinical Data: Altered mental status, stage IV colon cancer  CT HEAD WITHOUT CONTRAST  Technique:  Contiguous axial images were obtained from the base of the skull through the vertex without contrast.  Comparison: None.  Findings: Motion degraded images.  No evidence of parenchymal hemorrhage or extra-axial fluid collection. No mass lesion, mass effect, or midline shift.  No CT evidence of acute infarction.  Mild small vessel ischemic changes.  Cerebral volume is age appropriate.  No ventriculomegaly.  The visualized paranasal sinuses are essentially clear. The mastoid air cells are unopacified.  No evidence of calvarial fracture.  IMPRESSION: Motion degraded images.  No evidence of acute intracranial  abnormality.  Original Report Authenticated By: Charline Bills, M.D.   Ct Angio Chest W/cm &/or Wo Cm  04/09/2012  *RADIOLOGY REPORT*  Clinical Data: Shortness of breath and elevated D-dimer; history of colon cancer  CT ANGIOGRAPHY CHEST  Technique:  Multidetector CT imaging of the chest using the standard protocol during bolus administration of intravenous contrast. Multiplanar reconstructed images including MIPs were obtained and reviewed to evaluate the vascular anatomy.  Contrast: OMNIPAQUE IOHEXOL 300 MG/ML  SOLN  Comparison: July 11, 2011  Findings: Patchy infiltrates are present throughout both lungs, most prominent in the right lung base.  There is also a moderate sized right pleural effusion.  There is new mediastinal and bihilar adenopathy, most severe in the right infrahilar region with resultant narrowing of the segmental bronchi and sub subsegmental pulmonary artery branches supplying the right lower lobe.  There are no pulmonary emboli.  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  No evidence of dissection.  Within the visualized upper abdomen, ascites is noted.  IMPRESSION: There is new bihilar and mediastinal adenopathy which is most severe in the right infrahilar region with resultant narrowing of the right lower lobe bronchi and pulmonary artery branches.  This is worrisome for metastatic disease.  Bilateral airspace infiltrates, most prominent in the right lower lobe.  Differential considerations include infection versus neoplasm.  PET scan may be helpful for differentiation.  Moderate sized right pleural effusion.  Original Report Authenticated By: Brandon Melnick, M.D.   Dg Chest Port 1 View  04/09/2012  *RADIOLOGY REPORT*  Clinical Data: Shortness of breath and bilateral leg swelling.  PORTABLE CHEST - 1 VIEW  Comparison: Portable chest 03/28/2012.  Findings: A right IJ Port-A-Cath is stable.  Left  midline opacification is improved.  Airspace disease in the right lower lobe appears to be increasing.  This could be postobstructive based on the prior PET scan.  Mild interstitial prominence suggests edema.  IMPRESSION:  1.  Improving left airspace disease. 2.  Increasing right lower lobe airspace disease may represent pneumonia or postobstructive pneumonitis. 3.  Probable mild edema.  Original Report Authenticated By: Jamesetta Orleans. MATTERN, M.D.    Medications: I have reviewed the patient's current medications.  Assessment/Plan:  1. Metastatic colon cancer, presenting with an a sending colon tumor (T3 N2 BM 1) with metastatic omental nodules noted at the time of a right colectomy 06/08/2011. There was initial radiologic improvement with FOLFOX chemotherapy. A restaging PET scan 02/27/2012 revealed a single new hepatic metastasis and a new right lower lobe pulmonary nodule.  2. Admission 03/22/2012 with a proximal small bowel obstruction secondary to metastatic colon cancer, status post a gastrojejunostomy procedure 03/25/2012  3. Atrial fibrillation  4. COPD  5. History of coronary artery disease  6. Dyspnea/hypoxia-I reviewed the CT of the chest from 04/09/2012. He has a right pleural effusion, right lung parenchymal infiltrates, and hilar/mediastinal lymphadenopathy. The chest CT findings could be related to progressive metastatic colon cancer, a primary lung tumor, or infection.   Recommendations:  1. Continue antibiotics and oxygen support  2. Diagnostic/therapeutic right thoracentesis with fluid to be sent for culture and cytology  3. Megace as an appetite stimulant  4. Optimize management of atrial fibrillation and apparent volume overload  5. Please call oncology as needed on 04/12/2012. I will see him on 04/13/2012. We will follow his clinical status following treatment with antibiotics and diuresis. He may be a candidate for salvage systemic chemotherapy if his performance  status improves. If not I will recommend a hospice referral.  His family was not present this morning.  Anyia Gierke  04/11/2012, 9:04 AM

## 2012-04-11 NOTE — Progress Notes (Addendum)
Patient ID: Kyle Love, male   DOB: 06-13-1941, 71 y.o.   MRN: [id number]  Assessment/Plan:   Active Problems:   SOB  - multifactorial in nature and likely secondary to ? PNA, metastatic disease, obstructive PNA, pleural effusion  - pt is currently hemodynamically stable and is maintaining oxygen saturations > 93% on 2 L [place]  - he requires as high as 4 L [place] - will continue to monitor vitals per floor protocol  - s/p IR thoracentesis with fluid culture and cytology analysis still pending - appreciate oncology input   PNA (pneumonia)  - will continue broad spectrum antibiotics   Atrial fibrillation  - currently in sinus rhythm, tachycardic but not in a-fib  - continue to monitor vitals per floor protocol   COLON CANCER  - followed by oncology service   Hypoalbuminemia  - secondary to malignancy and progressive failure to thrive  - continue Megace for appetite stimulation   EDUCATION  - test results and diagnostic studies were discussed with patient and pt's family who was present at the bedside  - patient and family have verbalized the understanding  - questions were answered at the bedside and contact information was provided for additional questions or concerns   Subjective: No events overnight.   Objective:  Vital signs in last 24 hours:  Filed Vitals:   05/02/2012 0621 05-02-2012 0940 May 02, 2012 1037 2012-05-02 1300  BP:  123/66 115/58 121/65  Pulse:    102  Temp:    98 F (36.7 C)  TempSrc:      Resp:    17  Height:      Weight: 86.501 kg (190 lb 11.2 oz)     SpO2:  92% 94% 95%    Intake/Output from previous day:   Intake/Output Summary (Last 24 hours) at May 02, 2012 1945 Last data filed at 05-02-12 1716  Gross per 24 hour  Intake    740 ml  Output   2375 ml  Net  -1635 ml    Physical Exam: General: Alert, awake, oriented x3, in no acute distress. Somewhat somnolent ( has been given pain medicine) HEENT: No bruits, no goiter. Moist mucous membranes, no  scleral icterus, no conjunctival pallor. Heart: Regular rate and rhythm, S1/S2 +, no murmurs, rubs, gallops. Lungs: Decreased sound at bases. No wheezing, no rhonchi, no rales.  Abdomen: Soft, nontender, nondistended, positive bowel sounds. Extremities: No clubbing or cyanosis, no pitting edema,  positive pedal pulses. Neuro: Grossly nonfocal.  Lab Results:  Lab 05/02/2012 0517 04/10/12 0447 04/09/12 1600  WBC 11.6* 12.8* 14.5*  HGB 9.8* 9.7* 10.8*  HCT 29.2* 28.4* 30.4*  PLT 362 362 400  MCV 94.2 92.5 92.1    Lab May 02, 2012 0517 04/10/12 0447 04/09/12 1600 04/06/12 0508 04/05/12 0501  NA 134* 132* 132* 128* 130*  K 3.5 3.8 4.1 3.5 4.0  CL 100 99 97 96 97  CO2 26 23 24 26 25   GLUCOSE 91 82 103* 107* 112*  BUN 32* 35* 35* 23 21  CREATININE 1.20 1.29 1.15 0.94 0.97  CALCIUM 8.3* 8.4 8.5 8.1* 8.4    Lab 04/09/12 1600  INR 1.17  PROTIME --   Cardiac markers:  Lab 04/09/12 1600  CKMB --  TROPONINI <0.30  MYOGLOBIN --   Studies/Results: Dg Chest 1 View 05/02/12 IMPRESSION: Right IJ Port-A-Cath is unchanged in position.  Patchy airspace disease bilateral lower lobe right greater than left again noted without change in aeration.  No convincing pulmonary edema.  No diagnostic  pneumothorax.    US Thoracentesis Asp Pleural Space W/img Guide 04/11/2012 IMPRESSION: Successful ultrasound guided right thoracentesis yielding 580 ml of pleural fluid.  Read by Brayton El PA-C     Medications: Scheduled Meds:   . aztreonam  2 g Intravenous Q8H  . digoxin  0.125 mg Oral Daily  . docusate sodium  100 mg Oral BID  . enoxaparin  40 mg Subcutaneous Q24H  . feeding supplement  237 mL Oral BID BM  . feeding supplement  1 Container Oral q morning - 10a  . folic acid  400 mcg Oral Daily  . furosemide  40 mg Intravenous BID  . levofloxacin (LEVAQUIN) IV  750 mg Intravenous Q24H  . magic mouthwash  10 mL Oral TID  . megestrol  800 mg Oral Daily  . metoprolol  100 mg Oral BID  .  mirtazapine  15 mg Oral QHS  . pantoprazole  40 mg Oral Daily  . sodium chloride  3 mL Intravenous Q12H  . vancomycin  750 mg Intravenous Q12H   Continuous Infusions:  PRN Meds:.sodium chloride, ALPRAZolam, HYDROcodone-acetaminophen, ondansetron, sodium chloride   LOS: 2 days   MAGICK-Tamarcus Condie 04/11/2012, 7:45 PM  [facility] Pager: [phone]

## 2012-04-11 NOTE — Progress Notes (Signed)
ANTIBIOTIC CONSULT NOTE - Follow-Up  Pharmacy Consult for Vancomycin Indication: rule out pneumonia  Allergies  Allergen Reactions  . Diltiazem Hcl     REACTION: Severe lower extremity edema on generic cardizem  . Penicillins     Pt can tolerate cephalosporins/Pt tol Zosyn 11/01/11  . Plasma Human     Patient Measurements: Wt: 90kg Ht: 70in  Vital Signs: Temp: 98 F (36.7 C) (04/19 1300) BP: 121/65 mmHg (04/19 1300) Pulse Rate: 102  (04/19 1300) Intake/Output from previous day: 04/18 0701 - 04/19 0700 In: 1190 [P.O.:480; I.V.:160; IV Piggyback:550] Out: 2225 [Urine:2225] Intake/Output from this shift:    Labs:  Basename 04/11/12 0517 04/10/12 0447 04/09/12 1600  WBC 11.6* 12.8* 14.5*  HGB 9.8* 9.7* 10.8*  PLT 362 362 400  LABCREA -- -- --  CREATININE 1.20 1.29 1.15   CrCl = 58 ml/min (normalized)   Microbiology: Recent Results (from the past 720 hour(s))  SURGICAL PCR SCREEN     Status: Normal   Collection Time   03/23/12  8:05 AM      Component Value Range Status Comment   MRSA, PCR NEGATIVE  NEGATIVE  Final    Staphylococcus aureus NEGATIVE  NEGATIVE  Final   SURGICAL PCR SCREEN     Status: Normal   Collection Time   03/25/12 11:32 AM      Component Value Range Status Comment   MRSA, PCR NEGATIVE  NEGATIVE  Final    Staphylococcus aureus NEGATIVE  NEGATIVE  Final     Medical History: Past Medical History  Diagnosis Date  . History of ETOH abuse     quit 25 yrs ago, heavy etoh 5 yrs  . Colitis   . History of GI diverticular bleed 3/11  . Rectus sheath hematoma 3/10  . Status post Maze operation for atrial fibrillation   . Aortic aneurysm   . 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 S/P colonoscopy 05/03/10    mod diverticulosis sigmoid colon, internal hemorrhoids, suspected diverticular bleed  . Diverticulosis 07/23/2008    colonoscopy by Dr Jena Gauss, hyperplastic polyp  . IDA  (iron deficiency anemia)     work-up including small bowel capsule study benign  . COPD (chronic obstructive pulmonary disease)   . CAD (coronary artery disease)     s/p CABG  . S/P aortic valve replacement   . Cancer right colon [id number]  . Weight decrease     20-30 lb in month  . Pneumonia   . History of colon polyps   . Shortness of breath   . Arthritis     bilateral neck and shoulder pain  . Atrial fibrillation     Medications:  Scheduled:     . aztreonam  2 g Intravenous Q8H  . digoxin  0.125 mg Oral Daily  . docusate sodium  100 mg Oral BID  . enoxaparin  40 mg Subcutaneous Q24H  . feeding supplement  237 mL Oral BID BM  . feeding supplement  1 Container Oral q morning - 10a  . folic acid  400 mcg Oral Daily  . furosemide  40 mg Intravenous BID  . levofloxacin (LEVAQUIN) IV  750 mg Intravenous Q24H  . magic mouthwash  10 mL Oral TID  . megestrol  800 mg Oral Daily  . metoprolol  100 mg Oral BID  . mirtazapine  15 mg Oral QHS  . pantoprazole  40 mg Oral  Daily  . sodium chloride  3 mL Intravenous Q12H  . vancomycin  750 mg Intravenous Q12H    Assessment: Kyle Love with metastatic colon cancer, discharged from hospital 4/16 after 17 days.  Now on vancomycin, aztreonam & levaquin for suspected HAP with penicillin allergy. Vanco trough slightly below goal 15-20. Will increase Vanco dose.  Goal of Therapy:  Vancomycin trough level 15-20 mcg/ml  Plan:   Increase Vancomycin to 1g IV q12h x 8 days total  Continue Aztreonam 2gm IV q8h x 8 days  Continue Levaquin 750mg  IV q24h x 3 days  Follow renal function & cultures  Darrol Angel, PharmD Pager: [phone] 04/11/2012,9:53 PM

## 2012-04-11 NOTE — Procedures (Signed)
Successful [place] (R)thoracentesis 580cc amber fluid aspirated. No complications Pt tolerated well  CXR ordered Specimen for labs as ordered.  Barbee Shro[facility] 04/11/2012 10:45 AM

## 2012-04-12 ENCOUNTER — Inpatient Hospital Stay (HOSPITAL_COMMUNITY): Payer: Medicare Other

## 2012-04-12 LAB — BASIC METABOLIC PANEL
Calcium: 8.4 mg/dL (ref 8.4–10.5)
GFR calc non Af Amer: 54 mL/min — ABNORMAL LOW (ref >90)
Glucose, Bld: 98 mg/dL (ref 70–99)
Sodium: 135 mEq/L (ref 135–145)

## 2012-04-12 LAB — CBC
MCH: 31.5 pg (ref 26.0–34.0)
MCHC: 33.7 g/dL (ref 30.0–36.0)
Platelets: 411 10*3/uL — ABNORMAL HIGH (ref 150–400)

## 2012-04-12 MED ORDER — IOHEXOL 300 MG/ML  SOLN: 100.0000 mL | Freq: Once | INTRAMUSCULAR | Status: AC | PRN

## 2012-04-12 NOTE — Progress Notes (Signed)
Patient ID: Kyle Love, male   DOB: [birth year], 71 y.o.   MRN: [id number]  Subjective: No events overnight. Patient denies chest pain, shortness of breath, abdominal pain. Had bowel movement and reports ambulating.  Objective:  Vital signs in last 24 hours:  Filed Vitals:   04/12/12 0439 04/12/12 1408 04/12/12 2111 04/12/12 2229  BP: 103/53 107/70 106/66 116/66  Pulse: 96 87 91 95  Temp: 98.4 F (36.9 C) 97.7 F (36.5 C) 98 F (36.7 C) 97.3 F (36.3 C)  TempSrc: Oral Oral Oral Oral  Resp: 20 20 20 20   Height:      Weight: 87.635 kg (193 lb 3.2 oz)     SpO2: 91% 92% 92%     Intake/Output from previous day:   Intake/Output Summary (Last 24 hours) at 04/12/12 2238 Last data filed at 04/12/12 1900  Gross per 24 hour  Intake 943.33 ml  Output    375 ml  Net 568.33 ml    Physical Exam: General: Alert, awake, oriented x3, in no acute distress. Somewhat somnolent HEENT: No bruits, no goiter. Moist mucous membranes, no scleral icterus, no conjunctival pallor. Heart: Regular rate and rhythm, S1/S2 +, no murmurs, rubs, gallops. Lungs: Clear to auscultation bilaterally. No wheezing, no rhonchi, no rales.  Abdomen: Soft, nontender, distended, positive bowel sounds. Extremities: No clubbing or cyanosis, no pitting edema,  positive pedal pulses. Neuro: Grossly nonfocal.  Lab Results:  Basic Metabolic Panel:    Component Value Date/Time   NA 135 04/12/2012 0435   K 3.5 04/12/2012 0435   CL 100 04/12/2012 0435   CO2 25 04/12/2012 0435   BUN 35* 04/12/2012 0435   CREATININE 1.28 04/12/2012 0435   GLUCOSE 98 04/12/2012 0435   CALCIUM 8.4 04/12/2012 0435   CBC:    Component Value Date/Time   WBC 10.9* 04/12/2012 0435   WBC 9.1 03/20/2012 1256   HGB 10.1* 04/12/2012 0435   HGB 12.4* 03/20/2012 1256   HCT 30.0* 04/12/2012 0435   HCT 35.4* 03/20/2012 1256   PLT 411* 04/12/2012 0435   PLT 252 03/20/2012 1256   MCV 93.5 04/12/2012 0435   MCV 94.7 03/20/2012 1256   NEUTROABS 10.9*  04/10/2012 0447   NEUTROABS 7.5* 03/20/2012 1256   LYMPHSABS 1.0 04/10/2012 0447   LYMPHSABS 0.6* 03/20/2012 1256   MONOABS 0.8 04/10/2012 0447   MONOABS 1.0* 03/20/2012 1256   EOSABS 0.1 04/10/2012 0447   EOSABS 0.1 03/20/2012 1256   BASOSABS 0.0 04/10/2012 0447   BASOSABS 0.0 03/20/2012 1256      Lab 04/12/12 0435 04/11/12 0517 04/10/12 0447 04/09/12 1600  WBC 10.9* 11.6* 12.8* 14.5*  HGB 10.1* 9.8* 9.7* 10.8*  HCT 30.0* 29.2* 28.4* 30.4*  PLT 411* 362 362 400  MCV 93.5 94.2 92.5 92.1  MCH 31.5 31.6 31.6 32.7  MCHC 33.7 33.6 34.2 35.5  RDW 15.3 15.5 15.2 15.1  LYMPHSABS -- -- 1.0 0.8  MONOABS -- -- 0.8 0.8  EOSABS -- -- 0.1 0.0  BASOSABS -- -- 0.0 0.0  BANDABS -- -- -- --    Lab 04/12/12 0435 04/11/12 0517 04/10/12 0447 04/09/12 1600 04/06/12 0508  NA 135 134* 132* 132* 128*  K 3.5 3.5 3.8 4.1 3.5  CL 100 100 99 97 96  CO2 25 26 23 24 26   GLUCOSE 98 91 82 103* 107*  BUN 35* 32* 35* 35* 23  CREATININE 1.28 1.20 1.29 1.15 0.94  CALCIUM 8.4 8.3* 8.4 8.5 8.1*  MG -- -- -- -- --  Lab 04/09/12 1600  INR 1.17  PROTIME --   Cardiac markers:  Lab 04/09/12 1600  CKMB --  TROPONINI <0.30  MYOGLOBIN --   No components found with this basename: POCBNP:3 Recent Results (from the past 240 hour(s))  BODY FLUID CULTURE     Status: Normal (Preliminary result)   Collection Time   04/11/12 10:31 AM      Component Value Range Status Comment   Specimen Description THORACENTESIS   Final    Special Requests NONE   Final    Gram Stain     Final    Value: NO WBC SEEN     NO ORGANISMS SEEN   Culture NO GROWTH 1 DAY   Final    Report Status PENDING   Incomplete     Studies/Results: Dg Chest 1 View  04/11/2012  *RADIOLOGY REPORT*  Clinical Data: Post right thoracentesis  CHEST - 1 VIEW  Comparison: 04/09/2012  Findings: Cardiomediastinal silhouette is stable.  Right IJ Port-A- Cath is unchanged in position.  Patchy airspace disease bilateral lower lobe right greater than left again  noted without change in aeration.  No convincing pulmonary edema.  No diagnostic pneumothorax. Status post median sternotomy again noted.  IMPRESSION: Right IJ Port-A-Cath is unchanged in position.  Patchy airspace disease bilateral lower lobe right greater than left again noted without change in aeration.  No convincing pulmonary edema.  No diagnostic pneumothorax.  Original Report Authenticated By: Natasha Mead, M.D.   US Thoracentesis Asp Pleural Space W/img Guide  04/11/2012  *RADIOLOGY REPORT*  Clinical Data:  Pneumonia, history of colon cancer, right-sided pleural effusion  ULTRASOUND GUIDED right  THORACENTESIS  Comparison:  None  An ultrasound guided thoracentesis was thoroughly discussed with the patient and questions answered.  The benefits, risks, alternatives and complications were also discussed.  The patient understands and wishes to proceed with the procedure.  Written consent was obtained.  Ultrasound was performed to localize and mark an adequate pocket of fluid in the right chest.  The area was then prepped and draped in the normal sterile fashion.  1% Lidocaine was used for local anesthesia.  Under ultrasound guidance a 19 gauge Yueh catheter was introduced.  Thoracentesis was performed.  The catheter was removed and a dressing applied.  Complications:  None  Findings: A total of approximately 580 ml of amber fluid was removed. A fluid sample was sent for laboratory analysis.  IMPRESSION: Successful ultrasound guided right thoracentesis yielding 580 ml of pleural fluid.  Read by Brayton El PA-C  Original Report Authenticated By: Reola Calkins, M.D.    Medications: Scheduled Meds:   . aztreonam  2 g Intravenous Q8H  . digoxin  0.125 mg Oral Daily  . docusate sodium  100 mg Oral BID  . enoxaparin  40 mg Subcutaneous Q24H  . feeding supplement  237 mL Oral BID BM  . feeding supplement  1 Container Oral q morning - 10a  . folic acid  400 mcg Oral Daily  . furosemide  40 mg  Intravenous BID  . levofloxacin (LEVAQUIN) IV  750 mg Intravenous Q24H  . magic mouthwash  10 mL Oral TID  . megestrol  800 mg Oral Daily  . metoprolol  100 mg Oral BID  . mirtazapine  15 mg Oral QHS  . pantoprazole  40 mg Oral Daily  . sodium chloride  3 mL Intravenous Q12H  . vancomycin  750 mg Intravenous Q12H  . vancomycin  1,000 mg Intravenous Q12H  Continuous Infusions:  PRN Meds:.sodium chloride, ALPRAZolam, HYDROcodone-acetaminophen, ondansetron, sodium chloride, sodium chloride  Assessment/Plan:  SOB  - multifactorial in nature and likely secondary to ? PNA, metastatic disease, obstructive PNA, pleural effusion  - pt is currently hemodynamically stable and is maintaining oxygen saturations > 93% on 2 L [place]  - he requires as high as 4 L [place] - will continue to monitor vitals per floor protocol  - s/p IR thoracentesis with fluid culture and cytology analysis still pending  - appreciate oncology input   PNA (pneumonia)  - will continue broad spectrum antibiotics   Atrial fibrillation  - currently in sinus rhythm, tachycardic but not in a-fib  - continue to monitor vitals per floor protocol   COLON CANCER  - followed by oncology service   Hypoalbuminemia  - secondary to malignancy and progressive failure to thrive  - continue Megace for appetite stimulation   EDUCATION  - test results and diagnostic studies were discussed with patient and pt's family who was present at the bedside  - patient and family have verbalized the understanding  - questions were answered at the bedside and contact information was provided for additional questions or concerns     LOS: 3 days   MAGICK-Ramiz Turpin 04/12/2012, 10:38 PM  [facility] Pager: [phone]

## 2012-04-12 NOTE — Progress Notes (Signed)
Dr. Izola Price notified CT of ABD and Pelvis can be done with IV contrast and no oral contrast.  Dr. Izola Price states "that will be OK".

## 2012-04-13 MED ORDER — FUROSEMIDE 10 MG/ML IJ SOLN: 40.0000 mg | Freq: Two times a day (BID) | INTRAMUSCULAR | Status: DC

## 2012-04-13 MED ORDER — IOHEXOL 300 MG/ML  SOLN
100.0000 mL | Freq: Once | INTRAMUSCULAR | Status: AC | PRN
  Administered 2012-04-13: 100 mL via INTRAVENOUS

## 2012-04-13 MED ORDER — FUROSEMIDE 10 MG/ML IJ SOLN
40.0000 mg | Freq: Once | INTRAMUSCULAR | Status: AC
  Administered 2012-04-13: 40 mg via INTRAVENOUS
  Filled 2012-04-13: qty 4

## 2012-04-13 NOTE — Progress Notes (Signed)
Pt was scheduled for a stat paracentesis today after the results of his abdominal CT. I was called by Internal Radiology and informed that the procedure would take place tomorrow morning instead of this afternoon. Dr. Izola Price was made aware of this by text page. Family was informed and questions regarding care were answered. Will continue to monitor pt.

## 2012-04-13 NOTE — Progress Notes (Signed)
Patient ID: Kyle Love, male   DOB: 1941/07/07, 71 y.o.   MRN: [id number]  Subjective: No events overnight. Patient denies chest pain, shortness of breath, abdominal pain. Had bowel movement and reports ambulating.  Objective:  Vital signs in last 24 hours:  Filed Vitals:   04/13/12 0612 04/13/12 0929 04/13/12 1429 04/13/12 1502  BP: 107/66 109/69 91/56 98/62   Pulse: 87 110 84   Temp: 97.6 F (36.4 C)  97.6 F (36.4 C)   TempSrc: Oral  Oral   Resp: 19  20   Height:      Weight: 85.821 kg (189 lb 3.2 oz)     SpO2: 91%  93%     Intake/Output from previous day:   Intake/Output Summary (Last 24 hours) at 04/13/12 1649 Last data filed at 04/13/12 1431  Gross per 24 hour  Intake  43.33 ml  Output      0 ml  Net  43.33 ml    Physical Exam: General: Alert, awake, oriented x3, in no acute distress. HEENT: No bruits, no goiter. Moist mucous membranes, no scleral icterus, no conjunctival pallor. Heart: Regular rate and rhythm, S1/S2 +, no murmurs, rubs, gallops. Lungs: Bibasilar crackles. No wheezing, no rhonchi, no rales.  Abdomen: Soft, nontender, distended from ascites, positive bowel sounds. Extremities: No clubbing or cyanosis, bilateral lower extremity pitting edema,  positive pedal pulses. Neuro: Grossly nonfocal.  Lab Results:  Basic Metabolic Panel:    Component Value Date/Time   NA 135 04/12/2012 0435   K 3.5 04/12/2012 0435   CL 100 04/12/2012 0435   CO2 25 04/12/2012 0435   BUN 35* 04/12/2012 0435   CREATININE 1.28 04/12/2012 0435   GLUCOSE 98 04/12/2012 0435   CALCIUM 8.4 04/12/2012 0435   CBC:    Component Value Date/Time   WBC 10.9* 04/12/2012 0435   WBC 9.1 03/20/2012 1256   HGB 10.1* 04/12/2012 0435   HGB 12.4* 03/20/2012 1256   HCT 30.0* 04/12/2012 0435   HCT 35.4* 03/20/2012 1256   PLT 411* 04/12/2012 0435   PLT 252 03/20/2012 1256   MCV 93.5 04/12/2012 0435   MCV 94.7 03/20/2012 1256   NEUTROABS 10.9* 04/10/2012 0447   NEUTROABS 7.5* 03/20/2012 1256   LYMPHSABS 1.0 04/10/2012 0447   LYMPHSABS 0.6* 03/20/2012 1256   MONOABS 0.8 04/10/2012 0447   MONOABS 1.0* 03/20/2012 1256   EOSABS 0.1 04/10/2012 0447   EOSABS 0.1 03/20/2012 1256   BASOSABS 0.0 04/10/2012 0447   BASOSABS 0.0 03/20/2012 1256      Lab 04/12/12 0435 04/11/12 0517 04/10/12 0447 04/09/12 1600  WBC 10.9* 11.6* 12.8* 14.5*  HGB 10.1* 9.8* 9.7* 10.8*  HCT 30.0* 29.2* 28.4* 30.4*  PLT 411* 362 362 400  MCV 93.5 94.2 92.5 92.1  MCH 31.5 31.6 31.6 32.7  MCHC 33.7 33.6 34.2 35.5  RDW 15.3 15.5 15.2 15.1  LYMPHSABS -- -- 1.0 0.8  MONOABS -- -- 0.8 0.8  EOSABS -- -- 0.1 0.0  BASOSABS -- -- 0.0 0.0  BANDABS -- -- -- --    Lab 04/12/12 0435 04/11/12 0517 04/10/12 0447 04/09/12 1600  NA 135 134* 132* 132*  K 3.5 3.5 3.8 4.1  CL 100 100 99 97  CO2 25 26 23 24   GLUCOSE 98 91 82 103*  BUN 35* 32* 35* 35*  CREATININE 1.28 1.20 1.29 1.15  CALCIUM 8.4 8.3* 8.4 8.5  MG -- -- -- --    Lab 04/09/12 1600  INR 1.17  PROTIME --  Cardiac markers:  Lab 04/09/12 1600  CKMB --  TROPONINI <0.30  MYOGLOBIN --   No components found with this basename: POCBNP:3 Recent Results (from the past 240 hour(s))  BODY FLUID CULTURE     Status: Normal (Preliminary result)   Collection Time   04/11/12 10:31 AM      Component Value Range Status Comment   Specimen Description THORACENTESIS   Final    Special Requests NONE   Final    Gram Stain     Final    Value: NO WBC SEEN     NO ORGANISMS SEEN   Culture NO GROWTH 2 DAYS   Final    Report Status PENDING   Incomplete     Studies/Results: Ct Abdomen Pelvis W Contrast  04/13/2012  *RADIOLOGY REPORT*  Clinical Data: Abdominal fullness.  History of biliary obstruction and metastatic lung cancer.  CT ABDOMEN AND PELVIS WITH CONTRAST  Technique:  Multidetector CT imaging of the abdomen and pelvis was performed following the standard protocol during bolus administration of intravenous contrast.  Contrast: OMNIPAQUE IOHEXOL 300 MG/ML   SOLN  Comparison: 03/21/2012  Findings: The patient has had median sternotomy.  The heart is enlarged.  There is coronary vessel calcification.  There are bilateral pleural effusions, increased since prior study.  There is dense opacity within the right lower lobe, consistent with consolidation, possibly infectious.  There are new liver lesions since prior study. Within the left hepatic dome, a new liver lesion is 3.4 cm.  Posterior right hepatic lobe lesions previously measured 1.19-0.6 cm and now measures 2.5 and 3.3 cm.  A right hepatic lesion more inferiorly previously measured 3.5 x 2.3 cm and now measures 3.4 x 2.6 cm. Additional liver lesions also appears objectively slightly larger. There is increased moderate ascites.  Again noted are numerous lymph nodes in the region of the porta hepatis and retroperitoneum. Porta hepatis node is portacaval lymph node is 1.0 cm, previously 1.0 cm, probably stable.  Left adrenal nodule is 2.2 cm, previously 1.8 cm.  Bilateral renal cysts are identified, larger on the right. Intrarenal calculi are present.  No evidence for ureteral stones. The gallbladder is surgically absent.  Surgical clips are identified in the stomach.  No evidence for bowel obstruction.  Colonic sutures are present.  Degenerative changes are seen in the spine.  IMPRESSION:  1.  Interval development of significant ascites. 2.  New bilateral pleural effusions, right greater than left. 3.  Right lower lobe infiltrate. 4.  Interval progression of hepatic metastases. 5.  Enlarged left adrenal nodule.  Original Report Authenticated By: Patterson Hammersmith, M.D.    Medications: Scheduled Meds:   . aztreonam  2 g Intravenous Q8H  . digoxin  0.125 mg Oral Daily  . docusate sodium  100 mg Oral BID  . enoxaparin  40 mg Subcutaneous Q24H  . feeding supplement  237 mL Oral BID BM  . feeding supplement  1 Container Oral q morning - 10a  . folic acid  400 mcg Oral Daily  . furosemide  40 mg Intravenous  BID  . levofloxacin (LEVAQUIN) IV  750 mg Intravenous Q24H  . magic mouthwash  10 mL Oral TID  . megestrol  800 mg Oral Daily  . metoprolol  100 mg Oral BID  . mirtazapine  15 mg Oral QHS  . pantoprazole  40 mg Oral Daily  . sodium chloride  3 mL Intravenous Q12H  . vancomycin  1,000 mg Intravenous Q12H  Continuous Infusions:  PRN Meds:.sodium chloride, ALPRAZolam, HYDROcodone-acetaminophen, iohexol, iohexol, ondansetron, sodium chloride, sodium chloride  Assessment/Plan:  SOB  - multifactorial in nature and likely secondary to ? PNA, metastatic disease, obstructive PNA, pleural effusion  - pt is currently hemodynamically stable and is maintaining oxygen saturations > 93% on 2 L [place]  - he requires as high as 4 L [place] - will continue to monitor vitals per floor protocol  - s/p IR thoracentesis with fluid culture and cytology analysis still pending  - appreciate oncology input   PNA (pneumonia)  - will continue broad spectrum antibiotics   Atrial fibrillation  - currently in sinus rhythm, tachycardic but not in a-fib  - continue to monitor vitals per floor protocol   COLON CANCER  - followed by oncology service  - repeat CT abdomen shows progression of the cancer - will attempt paracentesis in AM  Hypoalbuminemia  - secondary to malignancy and progressive failure to thrive  - continue Megace for appetite stimulation   EDUCATION  - test results and diagnostic studies were discussed with patient and pt's family who was present at the bedside  - patient and family have verbalized the understanding  - questions were answered at the bedside and contact information was provided for additional questions or concerns    LOS: 4 days   MAGICK-Sherrel Ploch 04/13/2012, 4:49 PM  [facility] Pager: [phone]

## 2012-04-13 NOTE — Progress Notes (Signed)
IP PROGRESS NOTE  Subjective:   He reports improvement in the dyspnea. No pain or nausea.  Objective: Vital signs in last 24 hours: Blood pressure 107/66, pulse 87, temperature 97.6 F (36.4 C), temperature source Oral, resp. rate 19, height 5\' 10"  (1.778 m), weight 189 lb 3.2 oz (85.821 kg), SpO2 91.00%.  Intake/Output from previous day: 04/20 0701 - 04/21 0700 In: 653.3 [P.O.:360; I.V.:43.3; IV Piggyback:250] Out: -   Physical Exam:  Lungs: Decreased breath sounds at the right lower chest, inspiratory rub/ rhonchi at the right posterior chest, inspiratory rales at the left lower chest, no respiratory distress Cardiac: Irregular Abdomen: Healed midline incision, no hepatomegaly, nontender, distended Extremities: Trace edema at the right greater than left lower leg  Portacath/PICC-without erythema  Lab Results:  [facility] 04/12/12 0435 04/11/12 0517  WBC 10.9* 11.6*  HGB 10.1* 9.8*  HCT 30.0* 29.2*  PLT 411* 362    BMET  Basename 04/12/12 0435 04/11/12 0517  NA 135 134*  K 3.5 3.5  CL 100 100  CO2 25 26  GLUCOSE 98 91  BUN 35* 32*  CREATININE 1.28 1.20  CALCIUM 8.4 8.3*    Studies/Results: Dg Chest 1 View  04/11/2012  *RADIOLOGY REPORT*  Clinical Data: Post right thoracentesis  CHEST - 1 VIEW  Comparison: 04/09/2012  Findings: Cardiomediastinal silhouette is stable.  Right IJ Port-A- Cath is unchanged in position.  Patchy airspace disease bilateral lower lobe right greater than left again noted without change in aeration.  No convincing pulmonary edema.  No diagnostic pneumothorax. Status post median sternotomy again noted.  IMPRESSION: Right IJ Port-A-Cath is unchanged in position.  Patchy airspace disease bilateral lower lobe right greater than left again noted without change in aeration.  No convincing pulmonary edema.  No diagnostic pneumothorax.  Original Report Authenticated By: Natasha Mead, M.D.   Ct Abdomen Pelvis W Contrast  04/13/2012  *RADIOLOGY REPORT*   Clinical Data: Abdominal fullness.  History of biliary obstruction and metastatic lung cancer.  CT ABDOMEN AND PELVIS WITH CONTRAST  Technique:  Multidetector CT imaging of the abdomen and pelvis was performed following the standard protocol during bolus administration of intravenous contrast.  Contrast: OMNIPAQUE IOHEXOL 300 MG/ML  SOLN  Comparison: 03/21/2012  Findings: The patient has had median sternotomy.  The heart is enlarged.  There is coronary vessel calcification.  There are bilateral pleural effusions, increased since prior study.  There is dense opacity within the right lower lobe, consistent with consolidation, possibly infectious.  There are new liver lesions since prior study. Within the left hepatic dome, a new liver lesion is 3.4 cm.  Posterior right hepatic lobe lesions previously measured 1.19-0.6 cm and now measures 2.5 and 3.3 cm.  A right hepatic lesion more inferiorly previously measured 3.5 x 2.3 cm and now measures 3.4 x 2.6 cm. Additional liver lesions also appears objectively slightly larger. There is increased moderate ascites.  Again noted are numerous lymph nodes in the region of the porta hepatis and retroperitoneum. Porta hepatis node is portacaval lymph node is 1.0 cm, previously 1.0 cm, probably stable.  Left adrenal nodule is 2.2 cm, previously 1.8 cm.  Bilateral renal cysts are identified, larger on the right. Intrarenal calculi are present.  No evidence for ureteral stones. The gallbladder is surgically absent.  Surgical clips are identified in the stomach.  No evidence for bowel obstruction.  Colonic sutures are present.  Degenerative changes are seen in the spine.  IMPRESSION:  1.  Interval development of significant ascites.  2.  New bilateral pleural effusions, right greater than left. 3.  Right lower lobe infiltrate. 4.  Interval progression of hepatic metastases. 5.  Enlarged left adrenal nodule.  Original Report Authenticated By: Patterson Hammersmith, M.D.   US  Thoracentesis Asp Pleural Space W/img Guide  04/11/2012  *RADIOLOGY REPORT*  Clinical Data:  Pneumonia, history of colon cancer, right-sided pleural effusion  ULTRASOUND GUIDED right  THORACENTESIS  Comparison:  None  An ultrasound guided thoracentesis was thoroughly discussed with the patient and questions answered.  The benefits, risks, alternatives and complications were also discussed.  The patient understands and wishes to proceed with the procedure.  Written consent was obtained.  Ultrasound was performed to localize and mark an adequate pocket of fluid in the right chest.  The area was then prepped and draped in the normal sterile fashion.  1% Lidocaine was used for local anesthesia.  Under ultrasound guidance a 19 gauge Yueh catheter was introduced.  Thoracentesis was performed.  The catheter was removed and a dressing applied.  Complications:  None  Findings: A total of approximately 580 ml of amber fluid was removed. A fluid sample was sent for laboratory analysis.  IMPRESSION: Successful ultrasound guided right thoracentesis yielding 580 ml of pleural fluid.  Read by Brayton El PA-C  Original Report Authenticated By: Reola Calkins, M.D.    Medications: I have reviewed the patient's current medications.  Assessment/Plan:  1. Metastatic colon cancer, presenting with an a sending colon tumor (T3 N2 BM 1) with metastatic omental nodules noted at the time of a right colectomy 06/08/2011. There was initial radiologic improvement with FOLFOX chemotherapy. A restaging PET scan 02/27/2012 revealed a single new hepatic metastasis and a new right lower lobe pulmonary nodule.  2. Admission 03/22/2012 with a proximal small bowel obstruction secondary to metastatic colon cancer, status post a gastrojejunostomy procedure 03/25/2012  3. Atrial fibrillation  4. COPD  5. History of coronary artery disease  6. Dyspnea/hypoxia-I reviewed the CT of the chest from 04/09/2012. He has a right pleural  effusion, right lung parenchymal infiltrates, and hilar/mediastinal lymphadenopathy. The chest CT findings could be related to progressive metastatic colon cancer, a primary lung tumor, or infection. He is status post a diagnostic/palliative thoracentesis on 04/11/2012. The cytology pending and pleural fluid is negative to date.  Recommendations:  1. Continue antibiotics and oxygen support  2. followup pleural fluid cytology  3. Megace as an appetite stimulant  4. increase diet and activity as tolerated  His family was not present this morning.  Kawanna Christley  04/13/2012, 7:49 AM

## 2012-04-14 ENCOUNTER — Inpatient Hospital Stay (HOSPITAL_COMMUNITY): Payer: Medicare Other

## 2012-04-14 LAB — CBC
Hemoglobin: 10.2 g/dL — ABNORMAL LOW (ref 13.0–17.0)
MCH: 31 pg (ref 26.0–34.0)
MCHC: 33.6 g/dL (ref 30.0–36.0)
MCV: 92.4 fL (ref 78.0–100.0)

## 2012-04-14 LAB — BASIC METABOLIC PANEL
BUN: 38 mg/dL — ABNORMAL HIGH (ref 6–23)
Calcium: 8.3 mg/dL — ABNORMAL LOW (ref 8.4–10.5)
Creatinine, Ser: 1.4 mg/dL — ABNORMAL HIGH (ref 0.50–1.35)
GFR calc non Af Amer: 49 mL/min — ABNORMAL LOW (ref >90)
Glucose, Bld: 105 mg/dL — ABNORMAL HIGH (ref 70–99)
Sodium: 135 mEq/L (ref 135–145)

## 2012-04-14 LAB — BODY FLUID CULTURE: Gram Stain: NONE SEEN

## 2012-04-14 LAB — BODY FLUID CELL COUNT WITH DIFFERENTIAL
Eos, Fluid: 0 %
Lymphs, Fluid: 16 %

## 2012-04-14 NOTE — Progress Notes (Signed)
ANTIBIOTIC CONSULT NOTE - Follow-Up  Pharmacy Consult for Vancomycin Indication: rule out pneumonia  Allergies  Allergen Reactions  . Diltiazem Hcl     REACTION: Severe lower extremity edema on generic cardizem  . Penicillins     Pt can tolerate cephalosporins/Pt tol Zosyn 11/01/11  . Plasma Human     Patient Measurements: Wt: 90kg Ht: 70in  Vital Signs: Temp: 97.9 F (36.6 C) (04/22 0605) Temp src: Oral (04/22 0605) BP: 114/52 mmHg (04/22 1036) Pulse Rate: 102  (04/22 0605)  Labs:  Basename 04/14/12 0410 04/12/12 0435  WBC 14.2* 10.9*  HGB 10.2* 10.1*  PLT 452* 411*  LABCREA -- --  CREATININE 1.40* 1.28   CrCl = 40 ml/min (normalized)   Microbiology: Recent Results (from the past 720 hour(s))  SURGICAL PCR SCREEN     Status: Normal   Collection Time   03/23/12  8:05 AM      Component Value Range Status Comment   MRSA, PCR NEGATIVE  NEGATIVE  Final    Staphylococcus aureus NEGATIVE  NEGATIVE  Final   SURGICAL PCR SCREEN     Status: Normal   Collection Time   03/25/12 11:32 AM      Component Value Range Status Comment   MRSA, PCR NEGATIVE  NEGATIVE  Final    Staphylococcus aureus NEGATIVE  NEGATIVE  Final   BODY FLUID CULTURE     Status: Normal (Preliminary result)   Collection Time   04/11/12 10:31 AM      Component Value Range Status Comment   Specimen Description THORACENTESIS   Final    Special Requests NONE   Final    Gram Stain     Final    Value: NO WBC SEEN     NO ORGANISMS SEEN   Culture NO GROWTH 2 DAYS   Final    Report Status PENDING   Incomplete    Medications:  Anti-infectives     Start     Dose/Rate Route Frequency Ordered Stop   04/12/12 0900   vancomycin (VANCOCIN) IVPB 1000 mg/200 mL premix        1,000 mg 200 mL/hr over 60 Minutes Intravenous Every 12 hours 04/11/12 2202 04/17/12 2059   04/09/12 2200   aztreonam (AZACTAM) 2 g in dextrose 5 % 50 mL IVPB        2 g 100 mL/hr over 30 Minutes Intravenous 3 times per day 04/09/12 1922  04/17/12 2159   04/09/12 2100   vancomycin (VANCOCIN) 750 mg in sodium chloride 0.9 % 150 mL IVPB        750 mg 150 mL/hr over 60 Minutes Intravenous Every 12 hours 04/09/12 1951 04/11/12 2347   04/09/12 1930   levofloxacin (LEVAQUIN) IVPB 750 mg        750 mg 100 mL/hr over 90 Minutes Intravenous Every 24 hours 04/09/12 1922 04/12/12 1929           Assessment:  70 yom with metastatic colon cancer, discharged from hospital 4/16 after 17 days.  Now on vancomycin, aztreonam & levaquin for suspected HAP with penicillin allergy.   Day #6/8 Vancomycin and Aztreonam.  Vancomycin dose increased for low trough on 4/19  Afebrile, WBC elevated to 14 today, SCr increased to 1.4 today.  Paracentesis ordered today.  Goal of Therapy:  Vancomycin trough level 15-20 mcg/ml  Plan:   Continue Vancomycin 1g IV q12h x 8 days total  Continue Aztreonam 2gm IV q8h x 8 days  Follow renal function &  cultures.  If SCr continues to increase, consider obtaining new vancomycin trough level.    Lynann Beaver PharmD, BCPS Pager [phone] 04/14/2012 10:50 AM

## 2012-04-14 NOTE — Procedures (Signed)
Successful US guided paracentesis from RLQ. Yielding 2.5  Liters of cloudy amber fluid. Pt tolerated procedure well. No immediate complications.  Specimen sent for labs as ordered.  [place], [facility]

## 2012-04-14 NOTE — Progress Notes (Signed)
IP PROGRESS NOTE  Subjective:   He continues to have a decreased appetite. He had an episode of nausea and vomiting yesterday.  Objective: Vital signs in last 24 hours: Blood pressure 106/61, pulse 81, temperature 97.9 F (36.6 C), temperature source Oral, resp. rate 18, height 5\' 10"  (1.778 m), weight 189 lb 3.2 oz (85.821 kg), SpO2 94.00%.  Intake/Output from previous day: 04/21 0701 - 04/22 0700 In: 910 [P.O.:360; IV Piggyback:550] Out: 400 [Urine:400]  Physical Exam:  Lungs: Decreased breath sounds at the right lower chest, inspiratory rails at the low chest bilaterally. rales at the left lower chest, no respiratory distress Cardiac: Irregular Abdomen: Healed midline incision, no hepatomegaly, nontender, distended Extremities: Trace edema at the right greater than left lower leg  Portacath/PICC-without erythema  Lab Results:  [facility] 04/14/12 0410 04/12/12 0435  WBC 14.2* 10.9*  HGB 10.2* 10.1*  HCT 30.4* 30.0*  PLT 452* 411*    BMET  Basename 04/14/12 0410 04/12/12 0435  NA 135 135  K 3.3* 3.5  CL 100 100  CO2 25 25  GLUCOSE 105* 98  BUN 38* 35*  CREATININE 1.40* 1.28  CALCIUM 8.3* 8.4    Studies/Results: Ct Abdomen Pelvis W Contrast  04/13/2012  *RADIOLOGY REPORT*  Clinical Data: Abdominal fullness.  History of biliary obstruction and metastatic lung cancer.  CT ABDOMEN AND PELVIS WITH CONTRAST  Technique:  Multidetector CT imaging of the abdomen and pelvis was performed following the standard protocol during bolus administration of intravenous contrast.  Contrast: OMNIPAQUE IOHEXOL 300 MG/ML  SOLN  Comparison: 03/21/2012  Findings: The patient has had median sternotomy.  The heart is enlarged.  There is coronary vessel calcification.  There are bilateral pleural effusions, increased since prior study.  There is dense opacity within the right lower lobe, consistent with consolidation, possibly infectious.  There are new liver lesions since prior study.  Within the left hepatic dome, a new liver lesion is 3.4 cm.  Posterior right hepatic lobe lesions previously measured 1.19-0.6 cm and now measures 2.5 and 3.3 cm.  A right hepatic lesion more inferiorly previously measured 3.5 x 2.3 cm and now measures 3.4 x 2.6 cm. Additional liver lesions also appears objectively slightly larger. There is increased moderate ascites.  Again noted are numerous lymph nodes in the region of the porta hepatis and retroperitoneum. Porta hepatis node is portacaval lymph node is 1.0 cm, previously 1.0 cm, probably stable.  Left adrenal nodule is 2.2 cm, previously 1.8 cm.  Bilateral renal cysts are identified, larger on the right. Intrarenal calculi are present.  No evidence for ureteral stones. The gallbladder is surgically absent.  Surgical clips are identified in the stomach.  No evidence for bowel obstruction.  Colonic sutures are present.  Degenerative changes are seen in the spine.  IMPRESSION:  1.  Interval development of significant ascites. 2.  New bilateral pleural effusions, right greater than left. 3.  Right lower lobe infiltrate. 4.  Interval progression of hepatic metastases. 5.  Enlarged left adrenal nodule.  Original Report Authenticated By: Patterson Hammersmith, M.D.   US Paracentesis  04/14/2012  *RADIOLOGY REPORT*  Clinical Data: Ascites  ULTRASOUND GUIDED PARACENTESIS  Comparison:  None  An ultrasound guided paracentesis was thoroughly discussed with the patient and questions answered.  The benefits, risks, alternatives and complications were also discussed.  The patient understands and wishes to proceed with the procedure.  Written consent was obtained.  Ultrasound was performed to localize and mark an adequate pocket of fluid  in the right lower quadrant of the abdomen.  The area was then prepped and draped in the normal sterile fashion.  1% Lidocaine was used for local anesthesia.  Under ultrasound guidance a 19 gauge Yueh catheter was introduced.  Paracentesis was  performed.  The catheter was removed and a dressing applied.  Complications:  None  Findings:  A total of approximately 2.5 liters of cloudy  amber fluid was removed.  A fluid sample was sent for laboratory analysis.  IMPRESSION: Successful ultrasound guided paracentesis yielding 2.5 liters of ascites.  Read by Brayton El PA-C  Original Report Authenticated By: Richarda Overlie, M.D.    Medications: I have reviewed the patient's current medications.  Assessment/Plan:  1. Metastatic colon cancer, presenting with an a sending colon tumor (T3 N2 BM 1) with metastatic omental nodules noted at the time of a right colectomy 06/08/2011. There was initial radiologic improvement with FOLFOX chemotherapy. A restaging PET scan 02/27/2012 revealed a single new hepatic metastasis and a new right lower lobe pulmonary nodule. A restaging CT of the abdomen on 04/13/2012 reveals new ascites and progression of the liver metastases  2. Admission 03/22/2012 with a proximal small bowel obstruction secondary to metastatic colon cancer, status post a gastrojejunostomy procedure 03/25/2012  3. Atrial fibrillation  4. COPD  5. History of coronary artery disease  6. Dyspnea/hypoxia-I reviewed the CT of the chest from 04/09/2012. He has a right pleural effusion, right lung parenchymal infiltrates, and hilar/mediastinal lymphadenopathy. The chest CT findings could be related to progressive metastatic colon cancer, a primary lung tumor, or infection. He is status post a diagnostic/palliative thoracentesis on 04/11/2012. The pleural fluid cytology is pending Recommendations:  1. Continue antibiotics and oxygen support  2. followup pleural fluid cytology  3. diagnostic/therapeutic paracentesis  4. increase diet and activity as tolerated  I discussed the situation with Mr. Bonebrake and his wife. 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 He indicated that he would like to be discharged to home  if the pleural fluid or ascites containing metastatic colon cancer.  I will followup on the pleural fluid and ascitic fluid cytology. I will see the patient in the a.m. on 04/15/2012.  Shaneice Barsanti  04/14/2012, 2:03 PM

## 2012-04-14 NOTE — Progress Notes (Signed)
Patient ID: Kyle Love, male   DOB: 13-Feb-1941, 71 y.o.   MRN: [id number]  Subjective: No events overnight. Patient denies chest pain, shortness of breath, abdominal pain. Had bowel movement and reports ambulating.  Objective:  Vital signs in last 24 hours:  Filed Vitals:   04/14/12 1036 04/14/12 1145 04/14/12 1200 04/14/12 1407  BP: 114/52 109/84 106/61 110/64  Pulse:   81 81  Temp:    98 F (36.7 C)  TempSrc:    Oral  Resp:   18 18  Height:      Weight:      SpO2:   94% 91%    Intake/Output from previous day:   Intake/Output Summary (Last 24 hours) at 04/14/12 2237 Last data filed at 04/14/12 1900  Gross per 24 hour  Intake    950 ml  Output    200 ml  Net    750 ml    Physical Exam: General: Alert, awake, oriented x3, in no acute distress. HEENT: No bruits, no goiter. Moist mucous membranes, no scleral icterus, no conjunctival pallor. Heart: Regular rate and rhythm, S1/S2 +, no murmurs, rubs, gallops. Lungs: Clear to auscultation bilaterally. No wheezing, no rhonchi, no rales.  Abdomen: Soft, nontender, distended from ascites, positive bowel sounds. Extremities: No clubbing or cyanosis, no pitting edema,  positive pedal pulses. Neuro: Grossly nonfocal.  Lab Results:  Basic Metabolic Panel:    Component Value Date/Time   NA 135 04/14/2012 0410   K 3.3* 04/14/2012 0410   CL 100 04/14/2012 0410   CO2 25 04/14/2012 0410   BUN 38* 04/14/2012 0410   CREATININE 1.40* 04/14/2012 0410   GLUCOSE 105* 04/14/2012 0410   CALCIUM 8.3* 04/14/2012 0410   CBC:    Component Value Date/Time   WBC 14.2* 04/14/2012 0410   WBC 9.1 03/20/2012 1256   HGB 10.2* 04/14/2012 0410   HGB 12.4* 03/20/2012 1256   HCT 30.4* 04/14/2012 0410   HCT 35.4* 03/20/2012 1256   PLT 452* 04/14/2012 0410   PLT 252 03/20/2012 1256   MCV 92.4 04/14/2012 0410   MCV 94.7 03/20/2012 1256   NEUTROABS 10.9* 04/10/2012 0447   NEUTROABS 7.5* 03/20/2012 1256   LYMPHSABS 1.0 04/10/2012 0447   LYMPHSABS 0.6*  03/20/2012 1256   MONOABS 0.8 04/10/2012 0447   MONOABS 1.0* 03/20/2012 1256   EOSABS 0.1 04/10/2012 0447   EOSABS 0.1 03/20/2012 1256   BASOSABS 0.0 04/10/2012 0447   BASOSABS 0.0 03/20/2012 1256      Lab 04/14/12 0410 04/12/12 0435 04/11/12 0517 04/10/12 0447 04/09/12 1600  WBC 14.2* 10.9* 11.6* 12.8* 14.5*  HGB 10.2* 10.1* 9.8* 9.7* 10.8*  HCT 30.4* 30.0* 29.2* 28.4* 30.4*  PLT 452* 411* 362 362 400  MCV 92.4 93.5 94.2 92.5 92.1  MCH 31.0 31.5 31.6 31.6 32.7  MCHC 33.6 33.7 33.6 34.2 35.5  RDW 15.5 15.3 15.5 15.2 15.1  LYMPHSABS -- -- -- 1.0 0.8  MONOABS -- -- -- 0.8 0.8  EOSABS -- -- -- 0.1 0.0  BASOSABS -- -- -- 0.0 0.0  BANDABS -- -- -- -- --    Lab 04/14/12 0410 04/12/12 0435 04/11/12 0517 04/10/12 0447 04/09/12 1600  NA 135 135 134* 132* 132*  K 3.3* 3.5 3.5 3.8 4.1  CL 100 100 100 99 97  CO2 25 25 26 23 24   GLUCOSE 105* 98 91 82 103*  BUN 38* 35* 32* 35* 35*  CREATININE 1.40* 1.28 1.20 1.29 1.15  CALCIUM 8.3* 8.4 8.3* 8.4  8.5  MG -- -- -- -- --    Lab 04/09/12 1600  INR 1.17  PROTIME --   Cardiac markers:  Lab 04/09/12 1600  CKMB --  TROPONINI <0.30  MYOGLOBIN --   No components found with this basename: POCBNP:3 Recent Results (from the past 240 hour(s))  BODY FLUID CULTURE     Status: Normal   Collection Time   04/11/12 10:31 AM      Component Value Range Status Comment   Specimen Description THORACENTESIS   Final    Special Requests NONE   Final    Gram Stain     Final    Value: NO WBC SEEN     NO ORGANISMS SEEN   Culture NO GROWTH 3 DAYS   Final    Report Status 04/14/2012 FINAL   Final     Studies/Results: Ct Abdomen Pelvis W Contrast  04/13/2012  *RADIOLOGY REPORT*  Clinical Data: Abdominal fullness.  History of biliary obstruction and metastatic lung cancer.  CT ABDOMEN AND PELVIS WITH CONTRAST  Technique:  Multidetector CT imaging of the abdomen and pelvis was performed following the standard protocol during bolus administration of  intravenous contrast.  Contrast: OMNIPAQUE IOHEXOL 300 MG/ML  SOLN  Comparison: 03/21/2012  Findings: The patient has had median sternotomy.  The heart is enlarged.  There is coronary vessel calcification.  There are bilateral pleural effusions, increased since prior study.  There is dense opacity within the right lower lobe, consistent with consolidation, possibly infectious.  There are new liver lesions since prior study. Within the left hepatic dome, a new liver lesion is 3.4 cm.  Posterior right hepatic lobe lesions previously measured 1.19-0.6 cm and now measures 2.5 and 3.3 cm.  A right hepatic lesion more inferiorly previously measured 3.5 x 2.3 cm and now measures 3.4 x 2.6 cm. Additional liver lesions also appears objectively slightly larger. There is increased moderate ascites.  Again noted are numerous lymph nodes in the region of the porta hepatis and retroperitoneum. Porta hepatis node is portacaval lymph node is 1.0 cm, previously 1.0 cm, probably stable.  Left adrenal nodule is 2.2 cm, previously 1.8 cm.  Bilateral renal cysts are identified, larger on the right. Intrarenal calculi are present.  No evidence for ureteral stones. The gallbladder is surgically absent.  Surgical clips are identified in the stomach.  No evidence for bowel obstruction.  Colonic sutures are present.  Degenerative changes are seen in the spine.  IMPRESSION:  1.  Interval development of significant ascites. 2.  New bilateral pleural effusions, right greater than left. 3.  Right lower lobe infiltrate. 4.  Interval progression of hepatic metastases. 5.  Enlarged left adrenal nodule.  Original Report Authenticated By: Patterson Hammersmith, M.D.   US Paracentesis  04/14/2012  *RADIOLOGY REPORT*  Clinical Data: Ascites  ULTRASOUND GUIDED PARACENTESIS  Comparison:  None  An ultrasound guided paracentesis was thoroughly discussed with the patient and questions answered.  The benefits, risks, alternatives and complications were  also discussed.  The patient understands and wishes to proceed with the procedure.  Written consent was obtained.  Ultrasound was performed to localize and mark an adequate pocket of fluid in the right lower quadrant of the abdomen.  The area was then prepped and draped in the normal sterile fashion.  1% Lidocaine was used for local anesthesia.  Under ultrasound guidance a 19 gauge Yueh catheter was introduced.  Paracentesis was performed.  The catheter was removed and a dressing applied.  Complications:  None  Findings:  A total of approximately 2.5 liters of cloudy  amber fluid was removed.  A fluid sample was sent for laboratory analysis.  IMPRESSION: Successful ultrasound guided paracentesis yielding 2.5 liters of ascites.  Read by Brayton El PA-C  Original Report Authenticated By: Richarda Overlie, M.D.    Medications: Scheduled Meds:   . aztreonam  2 g Intravenous Q8H  . digoxin  0.125 mg Oral Daily  . docusate sodium  100 mg Oral BID  . enoxaparin  40 mg Subcutaneous Q24H  . feeding supplement  237 mL Oral BID BM  . feeding supplement  1 Container Oral q morning - 10a  . folic acid  400 mcg Oral Daily  . magic mouthwash  10 mL Oral TID  . megestrol  800 mg Oral Daily  . metoprolol  100 mg Oral BID  . mirtazapine  15 mg Oral QHS  . pantoprazole  40 mg Oral Daily  . sodium chloride  3 mL Intravenous Q12H  . vancomycin  1,000 mg Intravenous Q12H   Continuous Infusions:  PRN Meds:.sodium chloride, ALPRAZolam, HYDROcodone-acetaminophen, ondansetron, sodium chloride, sodium chloride  Assessment/Plan:  Assessment/Plan:  SOB  - multifactorial in nature and likely secondary to ? PNA, metastatic disease, obstructive PNA, pleural effusion  - pt is currently hemodynamically stable and is maintaining oxygen saturations > 93% on 2 L [place]  - he requires as high as 4 L [place] - will continue to monitor vitals per floor protocol  - s/p IR thoracentesis with fluid culture and cytology analysis still  pending  - appreciate oncology input   PNA (pneumonia)  - will continue broad spectrum antibiotics   Atrial fibrillation  - currently in sinus rhythm, tachycardic but not in a-fib  - continue to monitor vitals per floor protocol   COLON CANCER  - followed by oncology service  - repeat CT abdomen shows progression of the cancer  - will attempt paracentesis today  Hypoalbuminemia  - secondary to malignancy and progressive failure to thrive  - continue Megace for appetite stimulation   EDUCATION  - test results and diagnostic studies were discussed with patient and pt's family who was present at the bedside  - patient and family have verbalized the understanding  - questions were answered at the bedside and contact information was provided for additional questions or concerns     LOS: 5 days   MAGICK-Kynlea Blackston 04/14/2012, 10:37 PM  [facility] Pager: [phone]

## 2012-04-14 NOTE — Plan of Care (Signed)
Problem: Phase II Progression Outcomes Goal: Tolerating diet Outcome: Not Progressing Poor po intake at this time

## 2012-04-15 ENCOUNTER — Telehealth: Payer: Self-pay | Admitting: *Deleted

## 2012-04-15 LAB — BASIC METABOLIC PANEL
Calcium: 8.3 mg/dL — ABNORMAL LOW (ref 8.4–10.5)
Creatinine, Ser: 1.38 mg/dL — ABNORMAL HIGH (ref 0.50–1.35)
GFR calc Af Amer: 57 mL/min — ABNORMAL LOW (ref >90)
GFR calc non Af Amer: 50 mL/min — ABNORMAL LOW (ref >90)
Sodium: 132 mEq/L — ABNORMAL LOW (ref 135–145)

## 2012-04-15 LAB — CBC
MCH: 31.2 pg (ref 26.0–34.0)
MCHC: 33.2 g/dL (ref 30.0–36.0)
MCV: 94 fL (ref 78.0–100.0)
Platelets: 412 10*3/uL — ABNORMAL HIGH (ref 150–400)
RBC: 3.33 MIL/uL — ABNORMAL LOW (ref 4.22–5.81)
RDW: 15.8 % — ABNORMAL HIGH (ref 11.5–15.5)

## 2012-04-15 MED ORDER — HYDROCODONE-ACETAMINOPHEN 10-325 MG PO TABS
1.0000 | ORAL_TABLET | ORAL | Status: DC | PRN
  Administered 2012-04-15 – 2012-04-17 (×7): 1 via ORAL
  Filled 2012-04-15 (×9): qty 1

## 2012-04-15 MED ORDER — HYDROCODONE-ACETAMINOPHEN 10-325 MG PO TABS
1.0000 | ORAL_TABLET | Freq: Once | ORAL | Status: AC
  Administered 2012-04-15: 1 via ORAL
  Filled 2012-04-15: qty 1

## 2012-04-15 NOTE — Telephone Encounter (Signed)
Daughter asking for exact measurements from liver lesions on CT scan. Gave her previous and current measurements and that there are new lesions and others are larger. She asked "is this bad", she was told it shows progression of his disease, which is not favorable.

## 2012-04-15 NOTE — Progress Notes (Signed)
Patient ID: Kyle Love, male   DOB: 1941-07-07, 71 y.o.   MRN: [id number]  INTERIM SUMMARY: Pt is very pleasant 71 y.o. 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 He had undergone gastrojejunostomy while hospitalized and now is 2 weeks after the surgery. His last chemotherapy was about a month ago. He presented with progressive malaise, failure to thrive and generalized anasarca (swelling in the abdomen and lower extremities), shortness of breath. Was found to be hypoxic in ED with oxygen saturation 70-80 % on 2 L [place]. Doppler was done and showed no evidence of DVT CT angiogram chest done and showed no evidence of PE but infiltrates worrisome for either infection or metastases  DIAGNOSTIC TESTS: 04/09/2012: CXR: IMPRESSION:  1. Improving left airspace disease.  2. Increasing right lower lobe airspace disease may represent pneumonia or postobstructive pneumonitis.  3. Probable mild edema.  04/09/2012 CT ANGIO OF THE CHEST: IMPRESSION:  1. There is new bihilar and mediastinal adenopathy which is most severe in the right infrahilar region with resultant narrowing of the right lower lobe bronchi and pulmonary artery branches.  2. This is worrisome for metastatic disease.  3. Bilateral airspace infiltrates, most prominent in the right lower lobe.  4. Differential considerations include infection versus neoplasm. PET scan may be helpful for differentiation.  5. Moderate sized right pleural effusion.   04/10/2012 US GUIDED THORACENTESIS: IMPRESSION:  Successful ultrasound guided right thoracentesis yielding 580 ml of  pleural fluid.  04/12/2012 CT ABDOMEN AND PELVIS: IMPRESSION:  1. Interval development of significant ascites.  2. New bilateral pleural effusions, right greater than left.  3. Right lower lobe infiltrate.  4. Interval progression of hepatic metastases.  5. Enlarged left adrenal nodule.  04/14/2012 US PARACENTESIS: IMPRESSION:  Successful ultrasound guided paracentesis yielding 2.5 liters of ascites.  ANTIBIOTICS:  AZTREONAM 04/09/2012 >>> 04/15/2012  VANCOMYCIN 04/09/2012 >>> 04/15/2012  CONSULTANTS:  ONCOLOGY   CARDIOLOGY  Subjective: No events overnight. Patient denies chest pain, shortness of breath, abdominal pain. Reports ambulating and has improved appetite.   Objective:  Vital signs in last 24 hours:  Filed Vitals:   04/15/12 1412 04/15/12 1722 04/15/12 2142 04/15/12 2159  BP: 98/56 99/65 110/72 95/60  Pulse: 76 80 78 79  Temp: 98 F (36.7 C) 97.8 F (36.6 C)  98 F (36.7 C)  TempSrc: Oral Oral  Oral  Resp: 20 18  16   Height:      Weight:      SpO2: 91% 95%  96%    Intake/Output from previous day:   Intake/Output Summary (Last 24 hours) at 04/15/12 2254 Last data filed at 04/15/12 0900  Gross per 24 hour  Intake    120 ml  Output      0 ml  Net    120 ml    Physical Exam: General: Alert, awake, oriented x3, in no acute distress. HEENT: No bruits, no goiter. Moist mucous membranes, no scleral icterus, no conjunctival pallor. Temporal wasting noted Heart: Regular rate and rhythm, S1/S2 +, no murmurs, rubs, gallops. Lungs: Clear to auscultation bilaterally with bibasilar crackles. No wheezing, no rhonchi, no rales.  Abdomen: Soft, nontender, distended with ascites, positive bowel sounds. Extremities: No clubbing  or cyanosis, trace bilateral lower extremity pitting edema,  positive pedal pulses. Neuro: Grossly nonfocal.  Lab Results:  Lab 04/15/12 0640 04/14/12 0410 04/12/12 0435 04/11/12 0517 04/10/12 0447 04/09/12 1600  WBC 14.5* 14.2* 10.9*  11.6* 12.8* --  HGB 10.4* 10.2* 10.1* 9.8* 9.7* --  HCT 31.3* 30.4* 30.0* 29.2* 28.4* --  PLT 412* 452* 411* 362 362 --   Lab 04/15/12 0640 04/14/12 0410 04/12/12 0435 04/11/12 0517 04/10/12 0447  NA 132* 135 135 134* 132*  K 3.8 3.3* 3.5 3.5 3.8  CL 99 100 100 100 99  CO2 25 25 25 26 23   GLUCOSE 102* 105* 98 91 82  BUN 39* 38* 35* 32* 35*  CREATININE 1.38* 1.40* 1.28 1.20 1.29  CALCIUM 8.3* 8.3* 8.4 8.3* 8.4    Lab 04/09/12 1600  INR 1.17  PROTIME --   Cardiac markers:  Lab 04/09/12 1600  CKMB --  TROPONINI <0.30  MYOGLOBIN --   Medications: Scheduled Meds:   . aztreonam  2 g Intravenous Q8H  . digoxin  0.125 mg Oral Daily  . docusate sodium  100 mg Oral BID  . enoxaparin  40 mg Subcutaneous Q24H  . feeding supplement  237 mL Oral BID BM  . feeding supplement  1 Container Oral q morning - 10a  . folic acid  400 mcg Oral Daily  . HYDROcodone  1 tablet Oral Once  . magic mouthwash  10 mL Oral TID  . megestrol  800 mg Oral Daily  . metoprolol  100 mg Oral BID  . mirtazapine  15 mg Oral QHS  . pantoprazole  40 mg Oral Daily  . vancomycin  1,000 mg Intravenous Q12H   Continuous Infusions:  PRN Meds:.sodium chloride, ALPRAZolam, HYDROcodone-acetaminophen, ondansetron, sodium chloride, sodium chloride, DISCONTD: HYDROcodone-acetaminophen  Assessment/Plan:  SOB  - multifactorial in nature and likely secondary to ? PNA, metastatic disease, obstructive PNA, pleural effusion  - pt is currently hemodynamically stable and is maintaining oxygen saturations > 93% on 2 L [place]  - we can taper off oxygen and see how he does - will continue to monitor vitals per floor protocol  - s/p IR thoracentesis with fluid culture and cytology analysis with lining cells only and thus not entirely suggestive of an cancerous etiology - appreciate oncology input and guidance with this very complicated medical condition  PNA (pneumonia)  - will d/c abx today  Atrial fibrillation  - currently  in sinus rhythm, tachycardic but not in a-fib  - continue to monitor vitals per floor protocol  - d/c telemetry  COLON CANCER  - followed by oncology service  - repeat CT abdomen shows progression of the cancer  - s/p paracentesis - I believe he would benefit from additional paracentesis for further comfort - will order for AM  Hypoalbuminemia  - secondary to malignancy and progressive failure to thrive  - continue Megace for appetite stimulation    EDUCATION - test results and diagnostic studies were discussed with patient and pt's family who was present at the bedside - patient and family have verbalized the understanding - over 1 hour spent at bedside with family and emotional support provided  - pt was in good spirits when I left the room - I will keep him in my prayers   LOS: 6 days   Kyle Love, Kyle Love 04/15/2012, 10:54 PM  [facility] Pager: [phone]

## 2012-04-15 NOTE — Progress Notes (Signed)
IP PROGRESS NOTE  Subjective:   He reports an improved appetite. No nausea. He has been ambulating.  Objective: Vital signs in last 24 hours: Blood pressure 98/56, pulse 76, temperature 98 F (36.7 C), temperature source Oral, resp. rate 20, height 5\' 10"  (1.778 m), weight 189 lb 3.2 oz (85.821 kg), SpO2 91.00%.  Intake/Output from previous day: 04/22 0701 - 04/23 0700 In: 950 [P.O.:360; I.V.:240; IV Piggyback:350] Out: -   Physical Exam:  Lungs: Decreased breath sounds at the right lower chest, inspiratory rales at the low chest bilaterally., no respiratory distress Cardiac: Irregular Abdomen: Healed midline incision, no hepatomegaly, nontender, distended Extremities: No leg edema  Portacath/PICC-without erythema  Lab Results:  Basename 04/15/12 0640 04/14/12 0410  WBC 14.5* 14.2*  HGB 10.4* 10.2*  HCT 31.3* 30.4*  PLT 412* 452*    BMET  Basename 04/15/12 0640 04/14/12 0410  NA 132* 135  K 3.8 3.3*  CL 99 100  CO2 25 25  GLUCOSE 102* 105*  BUN 39* 38*  CREATININE 1.38* 1.40*  CALCIUM 8.3* 8.3*    Studies/Results: US Paracentesis  04/14/2012  *RADIOLOGY REPORT*  Clinical Data: Ascites  ULTRASOUND GUIDED PARACENTESIS  Comparison:  None  An ultrasound guided paracentesis was thoroughly discussed with the patient and questions answered.  The benefits, risks, alternatives and complications were also discussed.  The patient understands and wishes to proceed with the procedure.  Written consent was obtained.  Ultrasound was performed to localize and mark an adequate pocket of fluid in the right lower quadrant of the abdomen.  The area was then prepped and draped in the normal sterile fashion.  1% Lidocaine was used for local anesthesia.  Under ultrasound guidance a 19 gauge Yueh catheter was introduced.  Paracentesis was performed.  The catheter was removed and a dressing applied.  Complications:  None  Findings:  A total of approximately 2.5 liters of cloudy  amber fluid  was removed.  A fluid sample was sent for laboratory analysis.  IMPRESSION: Successful ultrasound guided paracentesis yielding 2.5 liters of ascites.  Read by Brayton El PA-C  Original Report Authenticated By: Richarda Overlie, M.D.    Medications: I have reviewed the patient's current medications.  Assessment/Plan:  1. Metastatic colon cancer, presenting with an a sending colon tumor (T3 N2 BM 1) with metastatic omental nodules noted at the time of a right colectomy 06/08/2011. There was initial radiologic improvement with FOLFOX chemotherapy. A restaging PET scan 02/27/2012 revealed a single new hepatic metastasis and a new right lower lobe pulmonary nodule. A restaging CT of the abdomen on 04/13/2012 reveals new ascites and progression of the liver metastases.  2. Admission 03/22/2012 with a proximal small bowel obstruction secondary to metastatic colon cancer, status post a gastrojejunostomy procedure 03/25/2012  3. Atrial fibrillation  4. COPD  5. History of coronary artery disease  6. Dyspnea/hypoxia-I reviewed the CT of the chest from 04/09/2012. He has a right pleural effusion, right lung parenchymal infiltrates, and hilar/mediastinal lymphadenopathy. The chest CT findings could be related to progressive metastatic colon cancer, a primary lung tumor, or infection. He is status post a diagnostic/palliative thoracentesis on 04/11/2012. The pleural fluid culture was negative in the cytology is nondiagnostic. 7. Ascites-likely malignant, cytology pending Recommendations:  1. Continue antibiotics and oxygen support-consider changing to an oral antibiotic and wean oxygen as tolerated  2.  increase diet and activity as tolerated  3. Followup peritoneal fluid cytology  4. Begin discharge planning  I discussed the situation with Mr. Lundberg  and his wife. His performance status has improved over the past day. Hopefully he can go home within the next one to 2 days. We will followup on the  peritoneal fluid cytology and make a decision on hospice versus a trial of salvage chemotherapy.   Lexxi Koslow  04/15/2012, 3:43 PM

## 2012-04-16 ENCOUNTER — Inpatient Hospital Stay (HOSPITAL_COMMUNITY): Payer: Medicare Other

## 2012-04-16 ENCOUNTER — Encounter (HOSPITAL_COMMUNITY): Payer: Self-pay | Admitting: Internal Medicine

## 2012-04-16 DIAGNOSIS — J189 Pneumonia, unspecified organism: Secondary | ICD-10-CM | POA: Diagnosis present

## 2012-04-16 DIAGNOSIS — N179 Acute kidney failure, unspecified: Secondary | ICD-10-CM

## 2012-04-16 DIAGNOSIS — R0600 Dyspnea, unspecified: Secondary | ICD-10-CM | POA: Diagnosis present

## 2012-04-16 DIAGNOSIS — R180 Malignant ascites: Secondary | ICD-10-CM | POA: Diagnosis present

## 2012-04-16 HISTORY — DX: Acute kidney failure, unspecified: N17.9

## 2012-04-16 LAB — BASIC METABOLIC PANEL
CO2: 25 mEq/L (ref 19–32)
Glucose, Bld: 89 mg/dL (ref 70–99)
Potassium: 3.9 mEq/L (ref 3.5–5.1)
Sodium: 134 mEq/L — ABNORMAL LOW (ref 135–145)

## 2012-04-16 LAB — CBC
Hemoglobin: 10.8 g/dL — ABNORMAL LOW (ref 13.0–17.0)
RBC: 3.46 MIL/uL — ABNORMAL LOW (ref 4.22–5.81)

## 2012-04-16 NOTE — Procedures (Signed)
US paracentesis Cloudy fluid, sample sent for cyto No complication No blood loss. See complete dictation in [facility].

## 2012-04-16 NOTE — Progress Notes (Addendum)
Patient went for paracentesis this afternoon. Pt had two consecutive low systolic blood pressures. Radiologist MD notified. No orders placed. Told to notify attending MD. Dr.C Rama notified. Will continue to monitor patient.

## 2012-04-16 NOTE — Progress Notes (Signed)
IP PROGRESS NOTE  Subjective:   He feels better since hospital admission. No nausea. He has been ambulating.  Objective: Vital signs in last 24 hours: Blood pressure 110/66, pulse 91, temperature 98 F (36.7 C), temperature source Axillary, resp. rate 17, height 5\' 10"  (1.778 m), weight 189 lb 3.2 oz (85.821 kg), SpO2 97.00%.  Intake/Output from previous day: 04/23 0701 - 04/24 0700 In: 960 [P.O.:120; I.V.:340; IV Piggyback:500] Out: -   Physical Exam:  Lungs: Decreased breath sounds at the right lower chest, inspiratory rales at the low chest bilaterally., no respiratory distress Cardiac: Irregular Abdomen: Healed midline incision, no hepatomegaly, nontender, distended Extremities: No leg edema  Portacath/PICC-without erythema  Lab Results:  Basename 04/16/12 0620 04/15/12 0640  WBC 13.4* 14.5*  HGB 10.8* 10.4*  HCT 32.0* 31.3*  PLT 462* 412*    BMET  Basename 04/16/12 0620 04/15/12 0640  NA 134* 132*  K 3.9 3.8  CL 101 99  CO2 25 25  GLUCOSE 89 102*  BUN 39* 39*  CREATININE 1.36* 1.38*  CALCIUM 8.0* 8.3*    Studies/Results: US Paracentesis  04/14/2012  *RADIOLOGY REPORT*  Clinical Data: Ascites  ULTRASOUND GUIDED PARACENTESIS  Comparison:  None  An ultrasound guided paracentesis was thoroughly discussed with the patient and questions answered.  The benefits, risks, alternatives and complications were also discussed.  The patient understands and wishes to proceed with the procedure.  Written consent was obtained.  Ultrasound was performed to localize and mark an adequate pocket of fluid in the right lower quadrant of the abdomen.  The area was then prepped and draped in the normal sterile fashion.  1% Lidocaine was used for local anesthesia.  Under ultrasound guidance a 19 gauge Yueh catheter was introduced.  Paracentesis was performed.  The catheter was removed and a dressing applied.  Complications:  None  Findings:  A total of approximately 2.5 liters of cloudy   amber fluid was removed.  A fluid sample was sent for laboratory analysis.  IMPRESSION: Successful ultrasound guided paracentesis yielding 2.5 liters of ascites.  Read by Brayton El PA-C  Original Report Authenticated By: Richarda Overlie, M.D.    Medications: I have reviewed the patient's current medications.  Assessment/Plan:  1. Metastatic colon cancer, presenting with an a sending colon tumor (T3 N2 BM 1) with metastatic omental nodules noted at the time of a right colectomy 06/08/2011. There was initial radiologic improvement with FOLFOX chemotherapy. A restaging PET scan 02/27/2012 revealed a single new hepatic metastasis and a new right lower lobe pulmonary nodule. A restaging CT of the abdomen on 04/13/2012 reveals new ascites and progression of the liver metastases.  2. Admission 03/22/2012 with a proximal small bowel obstruction secondary to metastatic colon cancer, status post a gastrojejunostomy procedure 03/25/2012. No obstructive symptoms at present.  3. Atrial fibrillation  4. COPD  5. History of coronary artery disease  6. Dyspnea/hypoxia-I reviewed the CT of the chest from 04/09/2012. He has a right pleural effusion, right lung parenchymal infiltrates, and hilar/mediastinal lymphadenopathy. The chest CT findings could be related to progressive metastatic colon cancer, a primary lung tumor, or infection. He is status post a diagnostic/palliative thoracentesis on 04/11/2012. The pleural fluid culture was negative and the cytology is nondiagnostic. 7. Ascites-likely malignant, cytology pending Recommendations:  1. Continue antibiotics and oxygen support-consider changing to an oral antibiotic and wean oxygen as tolerated. He will likely need home oxygen.  2.  increase diet and activity as tolerated  3. Followup peritoneal fluid cytology, a  repeat therapeutic paracentesis has been scheduled for today. Send fluid for cytology again.  4. he appears stable for discharge from an  oncology standpoint. We will schedule an office visit for the week of 04/21/2012. We will decide on hospice versus salvage chemotherapy based on his performance status next week.   Zamantha Strebel  04/16/2012, 10:10 AM

## 2012-04-16 NOTE — Progress Notes (Signed)
PROGRESS NOTE  Kyle Love ION:[id number] DOB: 09-09-1941 DOA: 04/09/2012 PCP: Colette Ribas, MD, MD  Brief narrative: Kyle Love is a 71 year old man with a past medical history of colon cancer who was admitted on 04/09/2012 with shortness of breath. He was found to have significant ascites and underwent a paracentesis on 04/16/2012. Cytology did show malignant cells. Family wishes to take him home for palliation.  Assessment/Plan: Principal Problem:  *Dyspnea  Multi-factorial with pneumonia, lung compression from significant ascites, Metastatic lung involvement of colon cancer, and pleural effusions all contributory.  Status post antibiotic therapy to treat the pneumonia.  Status post thoracentesis 04/11/2012 yielding 580 cc of fluid  Status post 2.5 L paracentesis on 04/14/2012 and repeat paracentesis yielding 3.4 L of fluid on 04/16/2012 to alleviate ascites.  Continue oxygen therapy as needed to maintain oxygen saturations. Active Problems:  COLON CANCER / Elevated LFTs  Malignant ascites noted.  Elevated LFTs likely reflective of liver and bone metastasis.  Likely will be discharged home with hospice care tomorrow.  Atrial fibrillation  Continue digoxin and metoprolol.  Has been maintaining normal sinus rhythm on telemetry.  Telemetry discontinued 04/15/2012.  PNA (pneumonia)  Status post treatment with vancomycin x7 days, aztreonam x7 days, and Levaquin x4 days.  Anasarca /Hypoalbuminemia / Malignant ascites  Status post ultrasound-guided paracentesis on 04/14/2012 and repeated on 04/16/2012.  Seen by dietitian on 04/10/2012. Ensure supplements ordered.  Continue Megace Acute renal failure  GFR 49-59 throughout this hospital stay.  GFR greater than 94/05/2012.  Suspect this is secondary to dehydration and diminished by mouth intake in the setting of advanced cancer.  Code Status: DNR/DNI Family Communication: Family updated at bedside. Disposition Plan:  Home with hospice in a.m.  Consultants:  Dr. Eda Keys, IR  Dr. Mancel Bale, Oncology  Other consultants:  Pharmacy  Dietitian  Antibiotics:  Vancomycin 04/09/12--->04/15/12  Aztreonam 04/09/12--->04/15/12  Levaquin 04/09/12--->04/12/12   Subjective  Kyle Love is weak but without significant complaints.  He denies dyspnea.  Very little PO intake, per family.   Objective    Interim History: Stable overnight.   Objective: Filed Vitals:   04/16/12 1534 04/16/12 1536 04/16/12 1610 04/16/12 1951  BP: 89/50 84/59 109/60   Pulse: 81  76   Temp: 97.5 F (36.4 C)     TempSrc: Axillary     Resp: 16     Height:      Weight:      SpO2: 95%   95%    Intake/Output Summary (Last 24 hours) at 04/16/12 2032 Last data filed at 04/15/12 2300  Gross per 24 hour  Intake    410 ml  Output      0 ml  Net    410 ml    Exam: Gen:  NAD Cardiovascular:  RRR, No M/R/G Respiratory: Lungs diminished with bibasilar rales. Gastrointestinal: Abdomen soft, NT/ND with normal active bowel sounds. Extremities: No C/E/C    Data Reviewed: Basic Metabolic Panel:  Lab 04/16/12 2440 04/15/12 0640 04/14/12 0410 04/12/12 0435 04/11/12 0517  NA 134* 132* 135 135 134*  K 3.9 3.8 -- -- --  CL 101 99 100 100 100  CO2 25 25 25 25 26   GLUCOSE 89 102* 105* 98 91  BUN 39* 39* 38* 35* 32*  CREATININE 1.36* 1.38* 1.40* 1.28 1.20  CALCIUM 8.0* 8.3* 8.3* 8.4 8.3*  MG -- -- -- -- --  PHOS -- -- -- -- --   GFR Estimated Creatinine Clearance: 50.7 ml/min (by C-G formula  based on Cr of 1.36). Liver Function Tests:  Lab 04/10/12 0447  AST 48*  ALT 96*  ALKPHOS 383*  BILITOT 0.6  PROT 6.6  ALBUMIN 1.8*    CBC:  Lab 04/16/12 0620 04/15/12 0640 04/14/12 0410 04/12/12 0435 04/11/12 0517 04/10/12 0447  WBC 13.4* 14.5* 14.2* 10.9* 11.6* --  NEUTROABS -- -- -- -- -- 10.9*  HGB 10.8* 10.4* 10.2* 10.1* 9.8* --  HCT 32.0* 31.3* 30.4* 30.0* 29.2* --  MCV 92.5 94.0 92.4 93.5 94.2 --  PLT  462* 412* 452* 411* 362 --   Microbiology Recent Results (from the past 240 hour(s))  BODY FLUID CULTURE     Status: Normal   Collection Time   04/11/12 10:31 AM      Component Value Range Status Comment   Specimen Description THORACENTESIS   Final    Special Requests NONE   Final    Gram Stain     Final    Value: NO WBC SEEN     NO ORGANISMS SEEN   Culture NO GROWTH 3 DAYS   Final    Report Status 04/14/2012 FINAL   Final   BODY FLUID CULTURE     Status: Normal (Preliminary result)   Collection Time   04/14/12 11:18 AM      Component Value Range Status Comment   Specimen Description PARACENTESIS   Final    Special Requests Immunocompromised   Final    Gram Stain     Final    Value: RARE WBC PRESENT,BOTH PMN AND MONONUCLEAR     NO ORGANISMS SEEN   Culture NO GROWTH 2 DAYS   Final    Report Status PENDING   Incomplete     Procedures and Diagnostic Studies:  Dg Chest 1 View 04/11/2012 IMPRESSION: Right IJ Port-A-Cath is unchanged in position.  Patchy airspace disease bilateral lower lobe right greater than left again noted without change in aeration.  No convincing pulmonary edema.  No diagnostic pneumothorax.  Original Report Authenticated By: Natasha Mead, M.D.    Ct Head Wo Contrast 04/09/2012 IMPRESSION: Motion degraded images.  No evidence of acute intracranial abnormality.  Original Report Authenticated By: Charline Bills, M.D.    Ct Angio Chest W/cm &/or Wo Cm 04/09/2012  IMPRESSION: There is new bihilar and mediastinal adenopathy which is most severe in the right infrahilar region with resultant narrowing of the right lower lobe bronchi and pulmonary artery branches.  This is worrisome for metastatic disease.  Bilateral airspace infiltrates, most prominent in the right lower lobe.  Differential considerations include infection versus neoplasm.  PET scan may be helpful for differentiation.  Moderate sized right pleural effusion.  Original Report Authenticated By: Brandon Melnick,  M.D.    Ct Abdomen Pelvis W Contrast 04/13/2012 IMPRESSION:  1.  Interval development of significant ascites. 2.  New bilateral pleural effusions, right greater than left. 3.  Right lower lobe infiltrate. 4.  Interval progression of hepatic metastases. 5.  Enlarged left adrenal nodule.  Original Report Authenticated By: Patterson Hammersmith, M.D.    US Paracentesis 04/16/2012 IMPRESSION: Successful ultrasound guided paracentesis yielding 3.4 liters of ascites.  Read by Brayton El PA-C  Original Report Authenticated By: Osa Craver, M.D.    US Paracentesis 04/14/2012 IMPRESSION: Successful ultrasound guided paracentesis yielding 2.5 liters of ascites.  Read by Brayton El PA-C  Original Report Authenticated By: Richarda Overlie, M.D.    Dg Chest Port 1 View 04/09/2012 IMPRESSION:  1.  Improving left airspace  disease. 2.  Increasing right lower lobe airspace disease may represent pneumonia or postobstructive pneumonitis. 3.  Probable mild edema.  Original Report Authenticated By: Jamesetta Orleans. MATTERN, M.D.    US Thoracentesis Asp Pleural Space W/img Guide 04/11/2012  IMPRESSION: Successful ultrasound guided right thoracentesis yielding 580 ml of pleural fluid.  Read by Brayton El PA-C  Original Report Authenticated By: Reola Calkins, M.D.    Scheduled Meds:    . digoxin  0.125 mg Oral Daily  . docusate sodium  100 mg Oral BID  . feeding supplement  237 mL Oral BID BM  . feeding supplement  1 Container Oral q morning - 10a  . folic acid  400 mcg Oral Daily  . magic mouthwash  10 mL Oral TID  . megestrol  800 mg Oral Daily  . metoprolol  100 mg Oral BID  . mirtazapine  15 mg Oral QHS  . pantoprazole  40 mg Oral Daily  . DISCONTD: aztreonam  2 g Intravenous Q8H  . DISCONTD: enoxaparin  40 mg Subcutaneous Q24H  . DISCONTD: sodium chloride  3 mL Intravenous Q12H  . DISCONTD: vancomycin  1,000 mg Intravenous Q12H   Continuous Infusions:     LOS: 7 days   Hillery Aldo,  MD Pager [phone]  04/16/2012, 8:32 PM

## 2012-04-16 NOTE — Progress Notes (Signed)
Patient is active with [facility] for HHRN/PT/nurses aide; patient has home 02;  Norberta Keens RN with [facility] called and updated; B Ave Filter RN, BSN, [place]

## 2012-04-17 ENCOUNTER — Ambulatory Visit: Payer: Medicare Other | Admitting: Nurse Practitioner

## 2012-04-17 ENCOUNTER — Telehealth: Payer: Self-pay | Admitting: *Deleted

## 2012-04-17 LAB — BODY FLUID CULTURE: Culture: NO GROWTH

## 2012-04-17 MED ORDER — METOPROLOL TARTRATE 100 MG PO TABS: 50.0000 mg | ORAL_TABLET | Freq: Two times a day (BID) | ORAL | Status: AC

## 2012-04-17 MED ORDER — HYDROCODONE-ACETAMINOPHEN 10-325 MG PO TABS: 1.0000 | ORAL_TABLET | ORAL | Status: AC | PRN

## 2012-04-17 MED ORDER — HYDROCODONE-ACETAMINOPHEN 10-325 MG PO TABS
1.0000 | ORAL_TABLET | Freq: Once | ORAL | Status: AC
  Administered 2012-04-17: 1 via ORAL

## 2012-04-17 NOTE — Progress Notes (Signed)
Talked to patient with spouse present about DCP; Patient and spouse Carney Bern requested [facility] [phone] ;Talked to [facility] with Hospice, clinical information faxed as requested - fax number [phone]; Waynetta Sandy will call CM with DME needs after talking to the patient's spouse; Patient is for discharge home today; B Lobbyist, BSN, [place]

## 2012-04-17 NOTE — Progress Notes (Signed)
Patient needs oxygen for transportation home; Patient was active with [facility] and they supplied his home 02 from previous admission. Mayra Reel with [facility] called - she will deliver an 02 tank for transportation home; B Lobbyist, BSN, [place].

## 2012-04-17 NOTE — Telephone Encounter (Signed)
Message from [facility] with hospice asking if Dr. Truett Perna will be attending for hospice services. Reviewed with Dr. Truett Perna: YES. Shon Hale made aware.

## 2012-04-17 NOTE — Discharge Summary (Addendum)
Physician Discharge Summary  Patient ID: Kyle Love MRN: [id number] DOB/AGE: 05-08-1941 71 y.o.  Admit date: 04/09/2012 Discharge date: 04/17/2012  Primary Care Physician:  Colette Ribas, MD, MD   Discharge Diagnoses:    Present on Admission:  .Atrial fibrillation .COLON CANCER .PNA (pneumonia) .Anasarca .Hypoalbuminemia .Malignant ascites .Dyspnea .Pneumonia .Acute renal failure .Elevated LFTs  Discharge Medications:  Medication List  As of 04/17/2012 10:40 AM   TAKE these medications         ALPRAZolam 0.5 MG tablet   Commonly known as: XANAX   TAKE 1/2 TO ONE TAB BY MOUTH EVERY 12 HOURS PRN ANXIETY. MAY ALSO USE FOR SLEEP PRN.      digoxin 0.125 MG tablet   Commonly known as: LANOXIN   Take 1 tablet (0.125 mg total) by mouth daily.      docusate sodium 100 MG capsule   Commonly known as: COLACE   Take 100 mg by mouth 2 (two) times daily.      folic acid 400 MCG tablet   Commonly known as: FOLVITE   Take 400 mcg by mouth daily.      furosemide 40 MG tablet   Commonly known as: LASIX   Take 1 tablet (40 mg total) by mouth daily.      HYDROcodone-acetaminophen 10-325 MG per tablet   Commonly known as: NORCO   Take 1-2 tablets by mouth every 4 (four) hours as needed for pain.      lidocaine-prilocaine cream   Commonly known as: EMLA   Ad lib.      magic mouthwash Soln   Take 10 mLs by mouth 3 (three) times daily.      megestrol 625 MG/5ML suspension   Commonly known as: MEGACE ES   Take 5 mLs (625 mg total) by mouth daily.      metoprolol 100 MG tablet   Commonly known as: LOPRESSOR   Take 0.5 tablets (50 mg total) by mouth 2 (two) times daily.      mirtazapine 15 MG tablet   Commonly known as: REMERON   Take 1 tablet (15 mg total) by mouth at bedtime.      ondansetron 8 MG tablet   Commonly known as: ZOFRAN   Take by mouth every 8 (eight) hours as needed. For nausea      pantoprazole 40 MG tablet   Commonly known as: PROTONIX   Take 1 tablet (40 mg total) by mouth daily.             Disposition and Follow-up: The patient is being discharged home with hospice care.   Medical Consults:  Dr. Eda Keys, IR  Dr. Mancel Bale, Oncology  Other Consults:  Pharmacy  Dietician  Physical Therapy   Procedures and Diagnostic Studies:  Dg Chest 1 View 04/11/2012 IMPRESSION: Right IJ Port-A-Cath is unchanged in position. Patchy airspace disease bilateral lower lobe right greater than left again noted without change in aeration. No convincing pulmonary edema. No diagnostic pneumothorax. Original Report Authenticated By: Natasha Mead, M.D.  Ct Head Wo Contrast 04/09/2012 IMPRESSION: Motion degraded images. No evidence of acute intracranial abnormality. Original Report Authenticated By: Charline Bills, M.D.  Ct Angio Chest W/cm &/or Wo Cm 04/09/2012 IMPRESSION: There is new bihilar and mediastinal adenopathy which is most severe in the right infrahilar region with resultant narrowing of the right lower lobe bronchi and pulmonary artery branches. This is worrisome for metastatic disease. Bilateral airspace infiltrates, most prominent in the right lower lobe. Differential considerations include  infection versus neoplasm. PET scan may be helpful for differentiation. Moderate sized right pleural effusion. Original Report Authenticated By: Brandon Melnick, M.D.  Ct Abdomen Pelvis W Contrast 04/13/2012 IMPRESSION: 1. Interval development of significant ascites. 2. New bilateral pleural effusions, right greater than left. 3. Right lower lobe infiltrate. 4. Interval progression of hepatic metastases. 5. Enlarged left adrenal nodule. Original Report Authenticated By: Patterson Hammersmith, M.D.  US Paracentesis 04/16/2012 IMPRESSION: Successful ultrasound guided paracentesis yielding 3.4 liters of ascites. Read by Brayton El PA-C Original Report Authenticated By: Osa Craver, M.D.  US Paracentesis 04/14/2012 IMPRESSION:  Successful ultrasound guided paracentesis yielding 2.5 liters of ascites. Read by Brayton El PA-C Original Report Authenticated By: Richarda Overlie, M.D.  Dg Chest Port 1 View 04/09/2012 IMPRESSION: 1. Improving left airspace disease. 2. Increasing right lower lobe airspace disease may represent pneumonia or postobstructive pneumonitis. 3. Probable mild edema. Original Report Authenticated By: Jamesetta Orleans. MATTERN, M.D.  US Thoracentesis Asp Pleural Space W/img Guide 04/11/2012 IMPRESSION: Successful ultrasound guided right thoracentesis yielding 580 ml of pleural fluid. Read by Brayton El PA-C Original Report Authenticated By: Reola Calkins, M.D.     Discharge Laboratory Values: Basic Metabolic Panel:  Lab 04/16/12 0454 04/15/12 0640 04/14/12 0410 04/12/12 0435 04/11/12 0517  NA 134* 132* 135 135 134*  K 3.9 3.8 -- -- --  CL 101 99 100 100 100  CO2 25 25 25 25 26   GLUCOSE 89 102* 105* 98 91  BUN 39* 39* 38* 35* 32*  CREATININE 1.36* 1.38* 1.40* 1.28 1.20  CALCIUM 8.0* 8.3* 8.3* 8.4 8.3*  MG -- -- -- -- --  PHOS -- -- -- -- --   GFR Estimated Creatinine Clearance: 50.7 ml/min (by C-G formula based on Cr of 1.36).  CBC:  Lab 04/16/12 0620 04/15/12 0640 04/14/12 0410 04/12/12 0435 04/11/12 0517  WBC 13.4* 14.5* 14.2* 10.9* 11.6*  NEUTROABS -- -- -- -- --  HGB 10.8* 10.4* 10.2* 10.1* 9.8*  HCT 32.0* 31.3* 30.4* 30.0* 29.2*  MCV 92.5 94.0 92.4 93.5 94.2  PLT 462* 412* 452* 411* 362   Microbiology Recent Results (from the past 240 hour(s))  BODY FLUID CULTURE     Status: Normal   Collection Time   04/11/12 10:31 AM      Component Value Range Status Comment   Specimen Description THORACENTESIS   Final    Special Requests NONE   Final    Gram Stain     Final    Value: NO WBC SEEN     NO ORGANISMS SEEN   Culture NO GROWTH 3 DAYS   Final    Report Status 04/14/2012 FINAL   Final   BODY FLUID CULTURE     Status: Normal (Preliminary result)   Collection Time   04/14/12 11:18  AM      Component Value Range Status Comment   Specimen Description PARACENTESIS   Final    Special Requests Immunocompromised   Final    Gram Stain     Final    Value: RARE WBC PRESENT,BOTH PMN AND MONONUCLEAR     NO ORGANISMS SEEN   Culture NO GROWTH 2 DAYS   Final    Report Status PENDING   Incomplete      Brief H and P: For complete details please refer to admission H and P, but in brief, Kyle Love is a 71 year old man with a past medical history of colon cancer who was admitted on 04/09/2012 with shortness  of breath. His shortness of breath was felt to be due to multiple etiologies including significant distention of the abdomen from malignant ascites.   Physical Exam at Discharge: BP 99/63  Pulse 78  Temp(Src) 97.6 F (36.4 C) (Oral)  Resp 16  Ht 5\' 10"  (1.778 m)  Wt 85.821 kg (189 lb 3.2 oz)  BMI 27.15 kg/m2  SpO2 96% Gen:  NAD. lethargic Cardiovascular:  RRR, No M/R/G Respiratory: Lungs diminished. Gastrointestinal: Abdomen softly distended  with normal active bowel sounds. Extremities: No C/E/C  Hospital Course:  Principal Problem:  *Dyspnea  Multi-factorial with pneumonia, lung compression from significant ascites, Metastatic lung involvement of colon cancer, and pleural effusions all contributory.  Status post antibiotic therapy to treat the pneumonia.  Status post thoracentesis 04/11/2012 yielding 580 cc of fluid  Status post 2.5 L paracentesis on 04/14/2012 and repeat paracentesis yielding 3.4 L of fluid on 04/16/2012 to alleviate ascites.  Continue oxygen therapy as needed to maintain oxygen saturations. We'll discharge home on supplemental oxygen since he continues to become significantly hypoxic with any activity. Active Problems:  COLON CANCER / Elevated LFTs  Malignant ascites noted.  Elevated LFTs likely reflective of liver and bone metastasis.  Discharge home with [facility]ice of [facility]. Atrial fibrillation  Continue digoxin and metoprolol.    Has been maintaining normal sinus rhythm on telemetry.  Telemetry discontinued 04/15/2012. PNA (pneumonia)  Status post treatment with vancomycin x7 days, aztreonam x7 days, and Levaquin x4 days. Anasarca /Hypoalbuminemia / Malignant ascites  Status post ultrasound-guided paracentesis on 04/14/2012 and repeated on 04/16/2012.  Seen by dietitian on 04/10/2012. Ensure supplements ordered.  Continue Megace Acute renal failure  GFR 49-59 throughout this hospital stay.  GFR greater than 94/05/2012.  Suspect this is secondary to dehydration and diminished by mouth intake in the setting of advanced cancer.  Diet:  Regular, as tolerated.  Activity:  Increase activity slowly.  Condition at Discharge:   Stable.  Time spent on Discharge:  35 minutes   Signed: Dr. Trula Ore Osiah Haring Pager [phone] 04/17/2012, 10:40 AM

## 2012-04-17 NOTE — Progress Notes (Signed)
PHYSICAL THERAPY NOTE  Order received. Chart reviewed. Spoke briefly with pt and pt's family. Family wanted PT eval but pt did not wish to participate prior to d/c today. Pt has RW and BSC available (will borrow wheelchair from brother-n-law for home entry). They are requesting hospital bed for home. Will defer PT eval to [facility] if pt agreeable. PT will sign off at this time.  Thanks.  Rebeca Alert, PT  [phone]

## 2012-04-17 NOTE — Progress Notes (Signed)
Talked to [facility] with [facility]; DME (hospital bed, egg crate mattress, over bed table and 02) to be delivered to the patient's home between the hours of 12 noon - 2 pm. B Ave Filter RN, BSN, [place]

## 2012-04-17 NOTE — Progress Notes (Signed)
OT Note Order received, chart reviewed. Defer to PT note written earlier. Pt has RW and BSC available and has access to a w/c and hospital bed. Will defer OT eval to [facility] and sign off.  Garrel Ridgel, OTR/L  Pager [phone] 04/17/2012

## 2012-04-18 ENCOUNTER — Telehealth: Payer: Self-pay | Admitting: Oncology

## 2012-04-18 NOTE — Telephone Encounter (Signed)
called pts home s/w wife  and scheduled appt for 5-08

## 2012-04-22 ENCOUNTER — Telehealth: Payer: Self-pay | Admitting: *Deleted

## 2012-04-22 NOTE — Telephone Encounter (Signed)
Call from Heidi Dach w/Hospice that patient died on 05-15-2012.

## 2012-04-23 DEATH — deceased

## 2012-04-24 ENCOUNTER — Telehealth: Payer: Self-pay | Admitting: Cardiovascular Disease

## 2012-04-24 NOTE — Telephone Encounter (Signed)
New msg Pt's wife wanted you to know that he passed away on 04/25/12.

## 2012-04-24 NOTE — Telephone Encounter (Signed)
FORWARDED TO DR [name]  FYI./CY

## 2012-04-28 ENCOUNTER — Encounter: Payer: Medicare Other | Admitting: Cardiovascular Disease

## 2012-04-30 ENCOUNTER — Ambulatory Visit: Payer: Medicare Other | Admitting: Oncology

## 2012-05-23 IMAGING — CT CT HEAD W/O CM
2 series · 16 of 30 positions shown, 20 images · non-contrast
Comparison: None.

CLINICAL DATA: Altered mental status, stage IV colon cancer

CT HEAD WITHOUT CONTRAST
TECHNIQUE: Contiguous axial images were obtained from the base of
the skull through the vertex without contrast.

[Series 2: head w/o · axial · non-contrast · 0.47mm/px · z∈[-623,-488]mm · 13 of 33 slices shown, 17 images]
[im 3/33  brain]
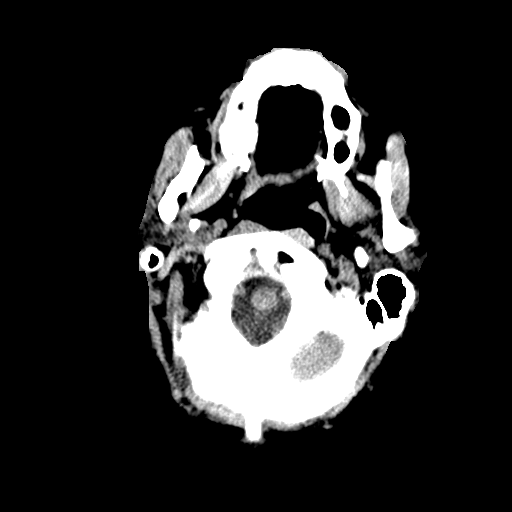
[im 3/33  bone]
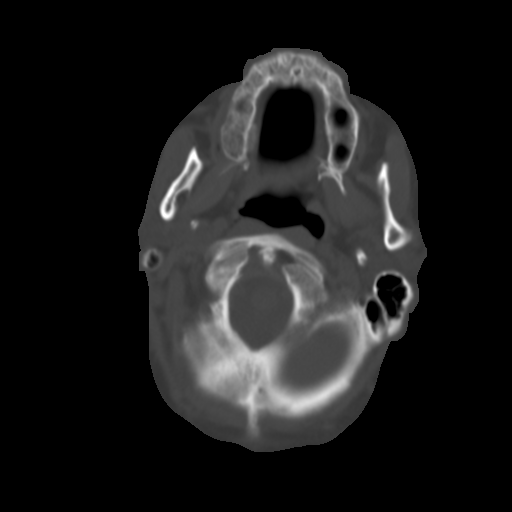
[im 5/33  brain]
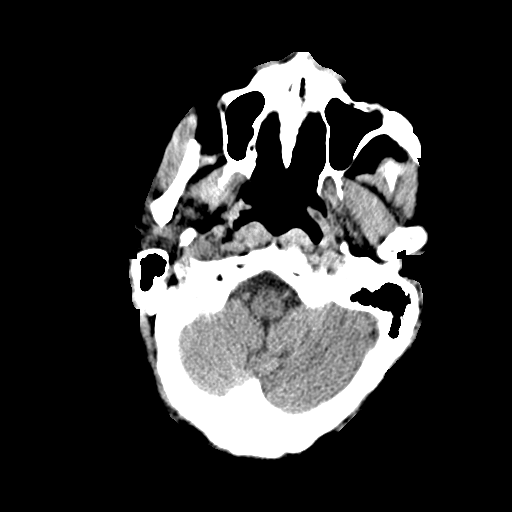
[im 7/33  brain]
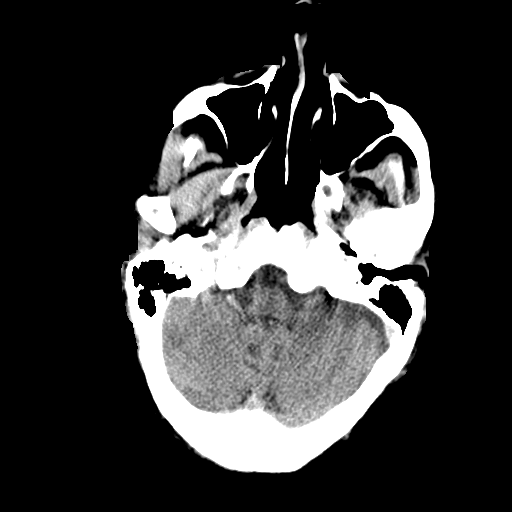
[im 10/33  brain]
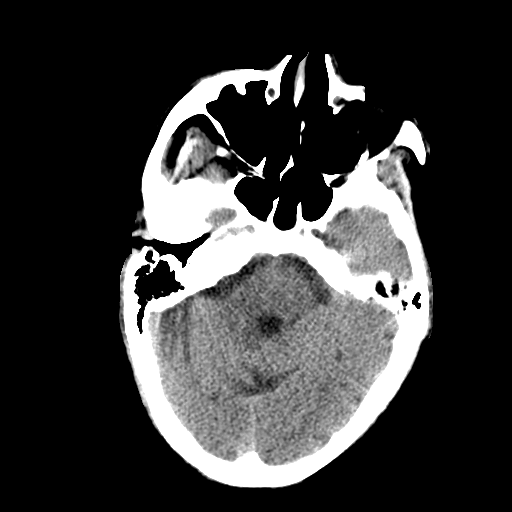
[im 12/33  brain]
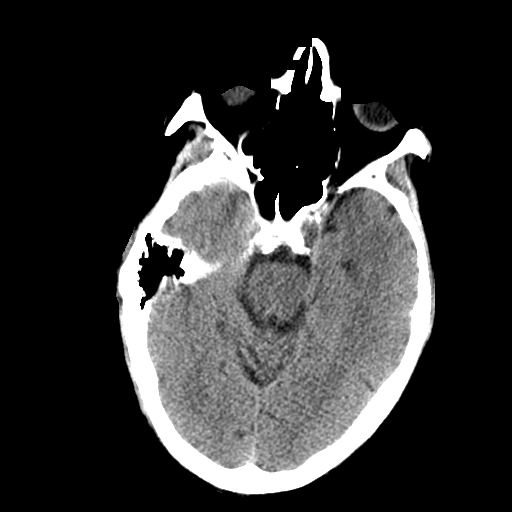
[im 12/33  bone]
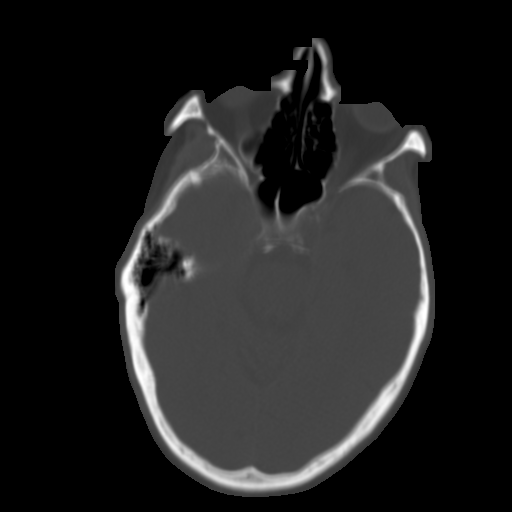
[im 14/33  brain]
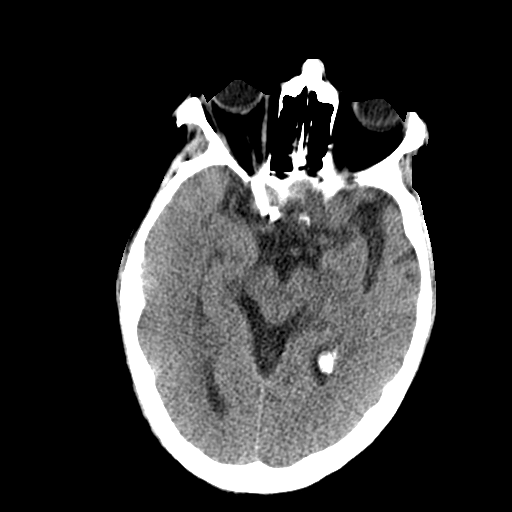
[im 17/33  brain]
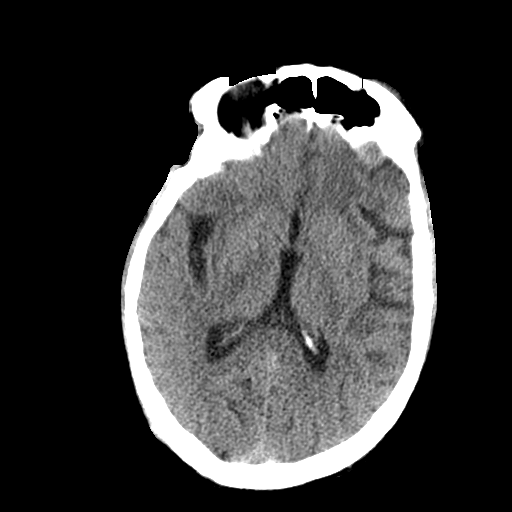
[im 19/33  brain]
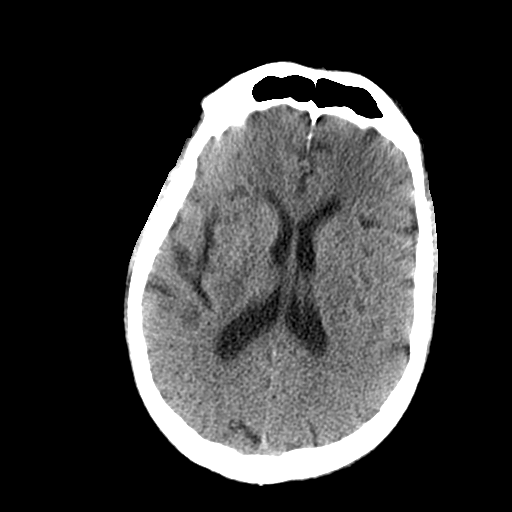
[im 21/33  brain]
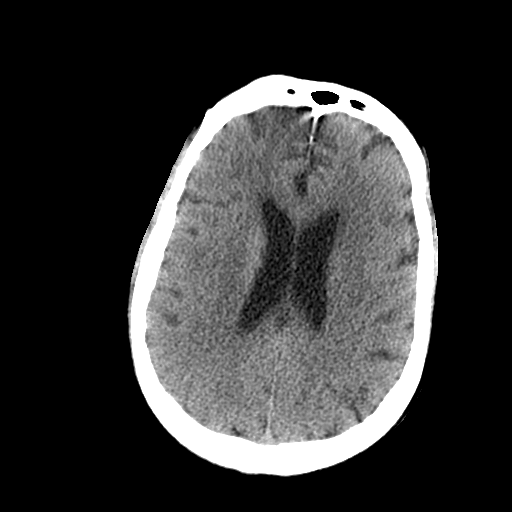
[im 21/33  bone]
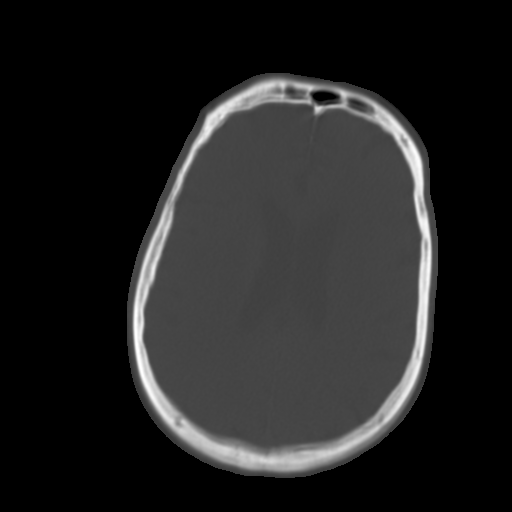
[im 23/33  brain]
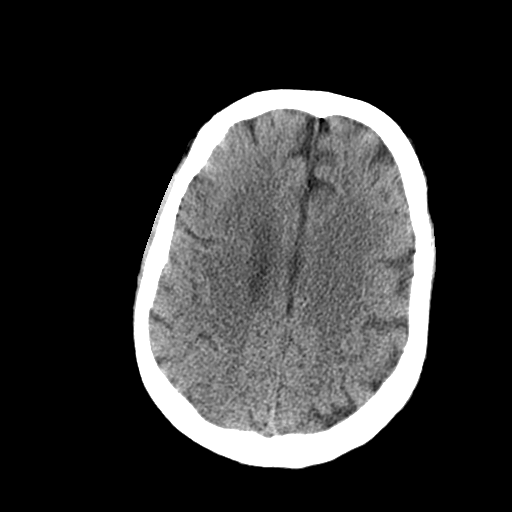
[im 26/33  brain]
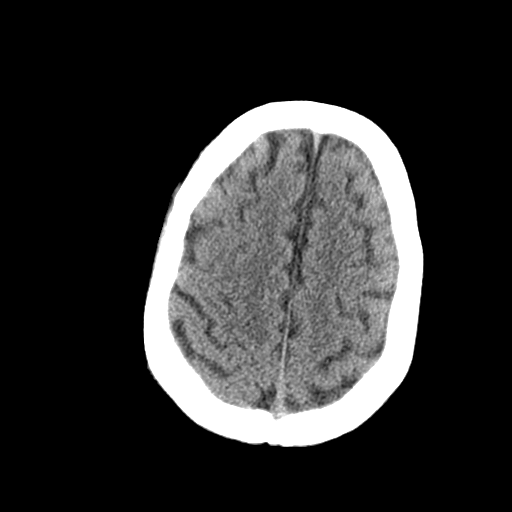
[im 28/33  brain]
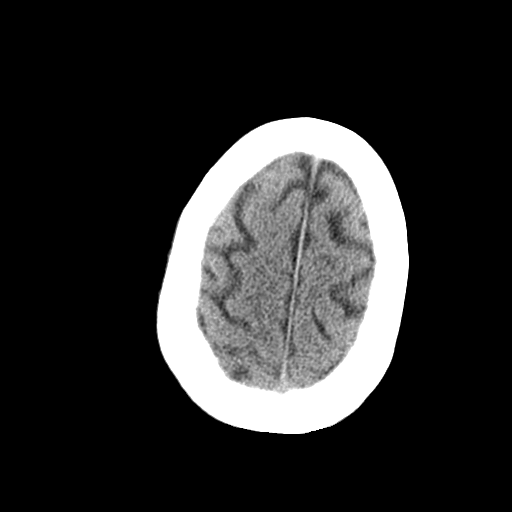
[im 30/33  brain]
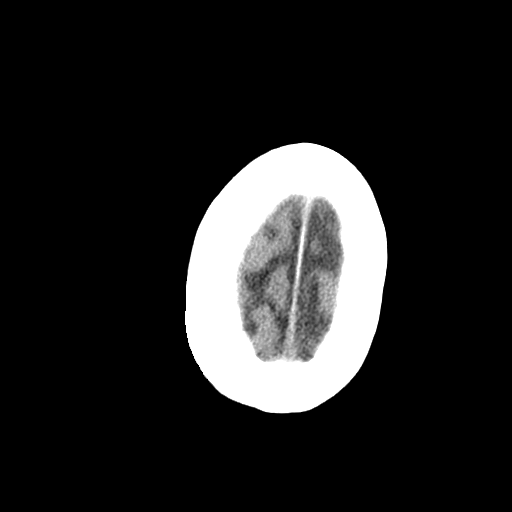
[im 30/33  bone]
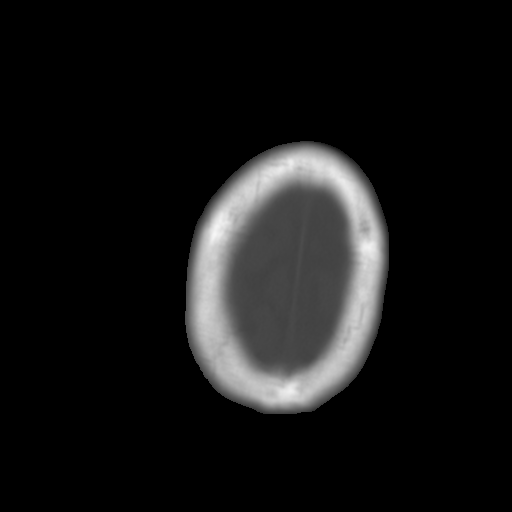

[Series 3: bone windows · axial · 0.47mm/px · z∈[-623,-578]mm · 3 of 33 slices shown]
[im 3/33  bone]
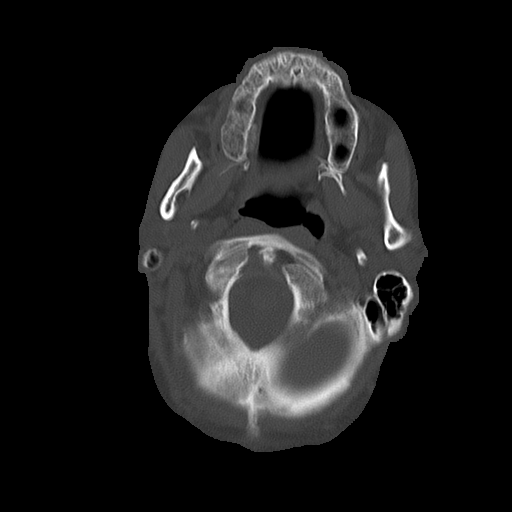
[im 7/33  bone]
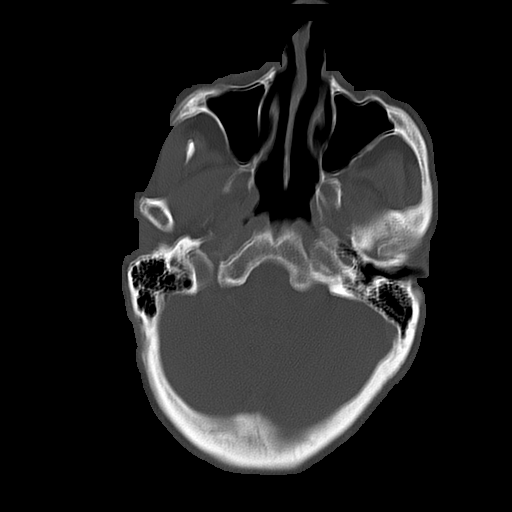
[im 12/33  bone]
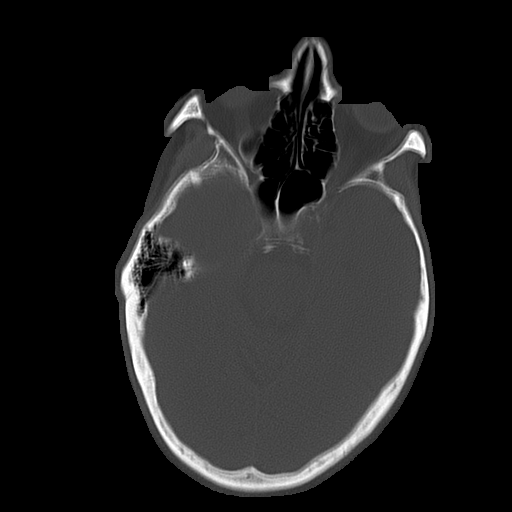

[16 of 30 positions shown; findings below may reference images not displayed]

FINDINGS: Motion degraded images.

No evidence of parenchymal hemorrhage or extra-axial fluid
collection. No mass lesion, mass effect, or midline shift.

No CT evidence of acute infarction.

Mild small vessel ischemic changes.

Cerebral volume is age appropriate.  No ventriculomegaly.

The visualized paranasal sinuses are essentially clear. The mastoid
air cells are unopacified.

No evidence of calvarial fracture.
IMPRESSION: Motion degraded images.

No evidence of acute intracranial abnormality.

## 2012-05-23 IMAGING — CR DG CHEST 1V PORT
1 series · 1 of 1 positions shown · non-contrast
Comparison: Portable chest 03/28/2012.

CLINICAL DATA: Shortness of breath and bilateral leg swelling.

PORTABLE CHEST - 1 VIEW

[AP]
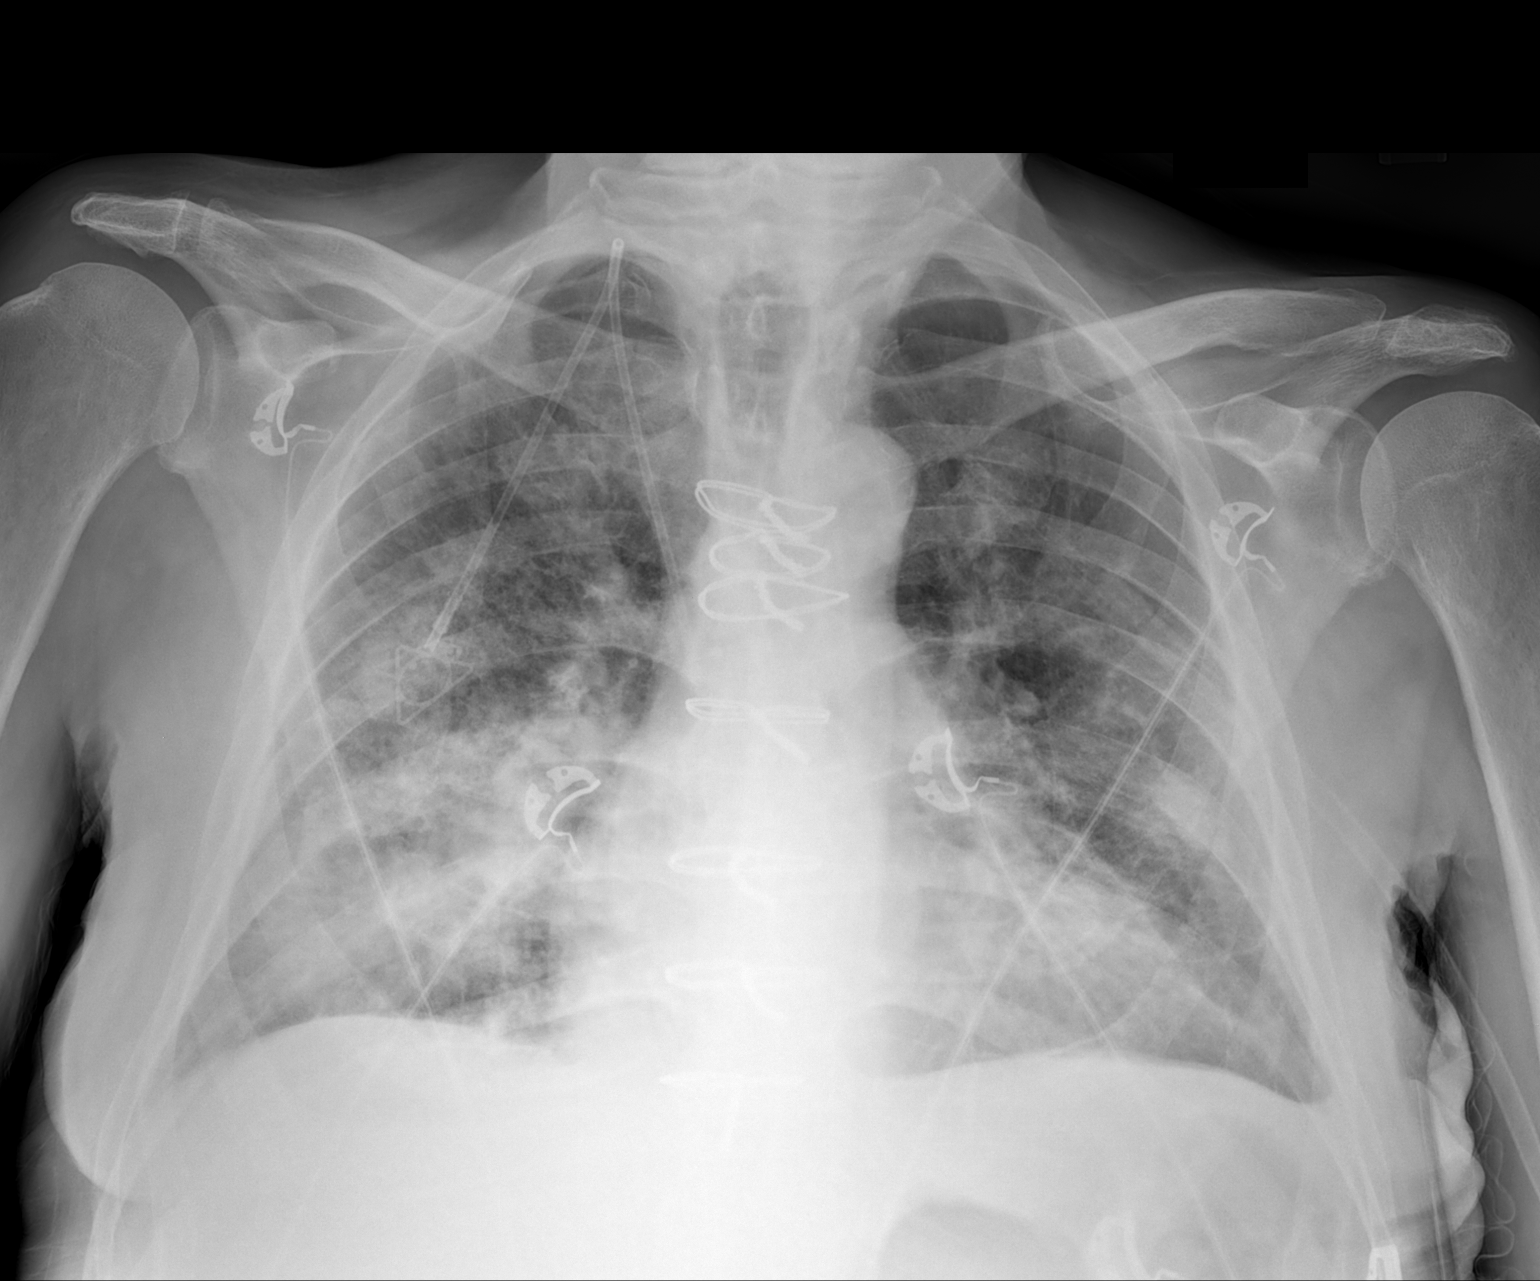

[1 of 1 positions shown; findings below may reference images not displayed]

FINDINGS: A right IJ Port-A-Cath is stable.  Left midline
opacification is improved.  Airspace disease in the right lower
lobe appears to be increasing.  This could be postobstructive based
on the prior PET scan.  Mild interstitial prominence suggests
edema.
IMPRESSION: 1.  Improving left airspace disease.
2.  Increasing right lower lobe airspace disease may represent
pneumonia or postobstructive pneumonitis.
3.  Probable mild edema.

## 2012-05-25 IMAGING — CR DG CHEST 1V
1 series · 1 of 1 positions shown · non-contrast
Comparison: 04/09/2012

CLINICAL DATA: Post right thoracentesis

CHEST - 1 VIEW

[view not recorded]
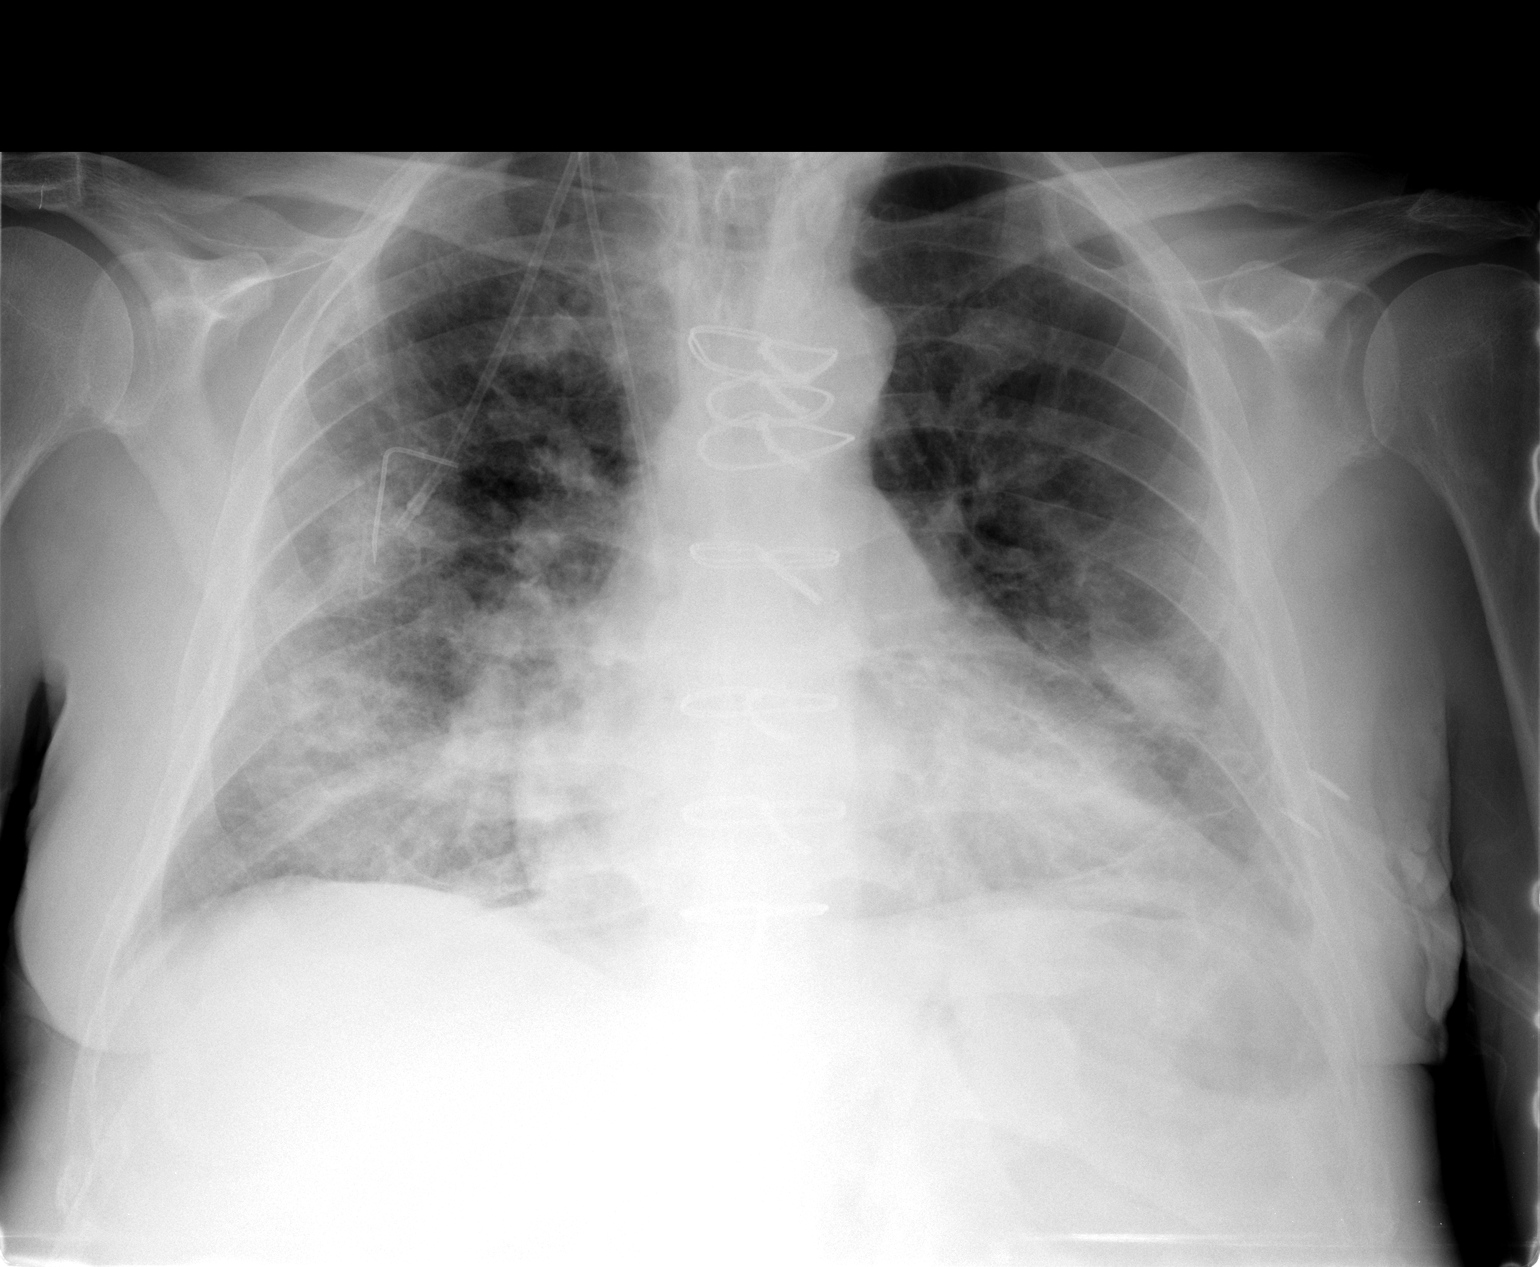

[1 of 1 positions shown; findings below may reference images not displayed]

FINDINGS: Cardiomediastinal silhouette is stable.  Right IJ Port-A-
Cath is unchanged in position.  Patchy airspace disease bilateral
lower lobe right greater than left again noted without change in
aeration.  No convincing pulmonary edema.  No diagnostic
pneumothorax. Status post median sternotomy again noted.
IMPRESSION: Right IJ Port-A-Cath is unchanged in position.  Patchy airspace
disease bilateral lower lobe right greater than left again noted
without change in aeration.  No convincing pulmonary edema.  No
diagnostic pneumothorax.

## 2012-10-01 ENCOUNTER — Other Ambulatory Visit: Payer: Self-pay | Admitting: Nurse Practitioner

## 2013-06-15 ENCOUNTER — Telehealth: Payer: Self-pay | Admitting: *Deleted

## 2013-06-15 NOTE — Telephone Encounter (Signed)
Called daughter back and determined they wanted copy of his medical record. Instructed her to call office tomorrow and ask for HIM department and they will guide her through process.

## 2013-06-15 NOTE — Telephone Encounter (Signed)
Daughter left VM on Friday and again today asking for nurse to call her or her mother.

## 2014-07-08 NOTE — Telephone Encounter (Signed)
Close Encounter
